# Patient Record
Sex: Male | Born: 1980 | Race: White | Hispanic: No | Marital: Single | Smoking: Current some day smoker
Health system: Southern US, Community
[De-identification: ages and names within clinical notes are randomized; demographics above are authoritative.]

## PROBLEM LIST (undated history)

## (undated) ENCOUNTER — Emergency Department (HOSPITAL_COMMUNITY): Admission: EM | Payer: MEDICAID | Source: Home / Self Care

## (undated) DIAGNOSIS — F909 Attention-deficit hyperactivity disorder, unspecified type: Secondary | ICD-10-CM

## (undated) DIAGNOSIS — F319 Bipolar disorder, unspecified: Secondary | ICD-10-CM

## (undated) DIAGNOSIS — R740 Nonspecific elevation of levels of transaminase and lactic acid dehydrogenase [LDH]: Secondary | ICD-10-CM

## (undated) DIAGNOSIS — K219 Gastro-esophageal reflux disease without esophagitis: Secondary | ICD-10-CM

---
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
## 2007-10-19 ENCOUNTER — Emergency Department (HOSPITAL_COMMUNITY): Admission: EM | Admit: 2007-10-19 | Discharge: 2007-10-19 | Payer: Self-pay | Admitting: Emergency Medicine

## 2008-02-08 ENCOUNTER — Emergency Department (HOSPITAL_COMMUNITY): Admission: EM | Admit: 2008-02-08 | Discharge: 2008-02-08 | Payer: Self-pay | Admitting: Emergency Medicine

## 2008-02-09 ENCOUNTER — Emergency Department (HOSPITAL_COMMUNITY): Admission: EM | Admit: 2008-02-09 | Discharge: 2008-02-09 | Payer: Self-pay | Admitting: Emergency Medicine

## 2008-07-09 ENCOUNTER — Emergency Department (HOSPITAL_COMMUNITY): Admission: EM | Admit: 2008-07-09 | Discharge: 2008-07-09 | Payer: Self-pay | Admitting: Emergency Medicine

## 2008-08-26 ENCOUNTER — Emergency Department (HOSPITAL_COMMUNITY): Admission: EM | Admit: 2008-08-26 | Discharge: 2008-08-26 | Payer: Self-pay | Admitting: Emergency Medicine

## 2009-03-23 ENCOUNTER — Emergency Department (HOSPITAL_COMMUNITY): Admission: EM | Admit: 2009-03-23 | Discharge: 2009-03-23 | Payer: Self-pay | Admitting: Emergency Medicine

## 2009-05-23 ENCOUNTER — Emergency Department (HOSPITAL_COMMUNITY): Admission: EM | Admit: 2009-05-23 | Discharge: 2009-05-23 | Payer: Self-pay | Admitting: Emergency Medicine

## 2009-07-21 ENCOUNTER — Emergency Department (HOSPITAL_COMMUNITY): Admission: EM | Admit: 2009-07-21 | Discharge: 2009-07-21 | Payer: Self-pay | Admitting: Emergency Medicine

## 2009-10-11 ENCOUNTER — Emergency Department (HOSPITAL_COMMUNITY): Admission: EM | Admit: 2009-10-11 | Discharge: 2009-10-11 | Payer: Self-pay | Admitting: Emergency Medicine

## 2010-07-30 ENCOUNTER — Emergency Department (HOSPITAL_COMMUNITY): Payer: Self-pay

## 2010-07-30 ENCOUNTER — Emergency Department (HOSPITAL_COMMUNITY)
Admission: EM | Admit: 2010-07-30 | Discharge: 2010-07-30 | Disposition: A | Discharge status: Home / Self Care | Payer: Self-pay | Attending: Emergency Medicine | Admitting: Emergency Medicine

## 2010-07-30 DIAGNOSIS — J45909 Unspecified asthma, uncomplicated: Secondary | ICD-10-CM | POA: Insufficient documentation

## 2010-07-30 DIAGNOSIS — R0602 Shortness of breath: Secondary | ICD-10-CM | POA: Insufficient documentation

## 2010-07-30 DIAGNOSIS — J3489 Other specified disorders of nose and nasal sinuses: Secondary | ICD-10-CM | POA: Insufficient documentation

## 2010-07-30 DIAGNOSIS — R000 Tachycardia, unspecified: Secondary | ICD-10-CM | POA: Insufficient documentation

## 2010-07-30 DIAGNOSIS — J069 Acute upper respiratory infection, unspecified: Secondary | ICD-10-CM | POA: Insufficient documentation

## 2010-07-31 ENCOUNTER — Emergency Department (HOSPITAL_COMMUNITY)
Admission: EM | Admit: 2010-07-31 | Discharge: 2010-08-01 | Disposition: A | Discharge status: Home / Self Care | Payer: Self-pay | Attending: Emergency Medicine | Admitting: Emergency Medicine

## 2010-07-31 DIAGNOSIS — R059 Cough, unspecified: Secondary | ICD-10-CM | POA: Insufficient documentation

## 2010-07-31 DIAGNOSIS — F988 Other specified behavioral and emotional disorders with onset usually occurring in childhood and adolescence: Secondary | ICD-10-CM | POA: Insufficient documentation

## 2010-07-31 DIAGNOSIS — J45909 Unspecified asthma, uncomplicated: Secondary | ICD-10-CM | POA: Insufficient documentation

## 2010-07-31 DIAGNOSIS — R05 Cough: Secondary | ICD-10-CM | POA: Insufficient documentation

## 2010-07-31 DIAGNOSIS — R6883 Chills (without fever): Secondary | ICD-10-CM | POA: Insufficient documentation

## 2010-11-05 ENCOUNTER — Emergency Department (HOSPITAL_COMMUNITY): Payer: Self-pay

## 2010-11-05 ENCOUNTER — Emergency Department (HOSPITAL_COMMUNITY)
Admission: EM | Admit: 2010-11-05 | Discharge: 2010-11-05 | Disposition: A | Discharge status: Home / Self Care | Payer: Self-pay | Attending: Emergency Medicine | Admitting: Emergency Medicine

## 2010-11-05 DIAGNOSIS — R0602 Shortness of breath: Secondary | ICD-10-CM | POA: Insufficient documentation

## 2010-11-05 DIAGNOSIS — W19XXXA Unspecified fall, initial encounter: Secondary | ICD-10-CM | POA: Insufficient documentation

## 2010-11-05 DIAGNOSIS — M79609 Pain in unspecified limb: Secondary | ICD-10-CM | POA: Insufficient documentation

## 2010-11-05 DIAGNOSIS — J45909 Unspecified asthma, uncomplicated: Secondary | ICD-10-CM | POA: Insufficient documentation

## 2010-11-05 DIAGNOSIS — S93609A Unspecified sprain of unspecified foot, initial encounter: Secondary | ICD-10-CM | POA: Insufficient documentation

## 2011-01-18 ENCOUNTER — Emergency Department (HOSPITAL_COMMUNITY)
Admission: EM | Admit: 2011-01-18 | Discharge: 2011-01-18 | Disposition: A | Discharge status: Home / Self Care | Payer: Self-pay | Attending: Emergency Medicine | Admitting: Emergency Medicine

## 2011-01-18 DIAGNOSIS — R0609 Other forms of dyspnea: Secondary | ICD-10-CM | POA: Insufficient documentation

## 2011-01-18 DIAGNOSIS — F988 Other specified behavioral and emotional disorders with onset usually occurring in childhood and adolescence: Secondary | ICD-10-CM | POA: Insufficient documentation

## 2011-01-18 DIAGNOSIS — R0989 Other specified symptoms and signs involving the circulatory and respiratory systems: Secondary | ICD-10-CM | POA: Insufficient documentation

## 2011-01-18 DIAGNOSIS — J45909 Unspecified asthma, uncomplicated: Secondary | ICD-10-CM | POA: Insufficient documentation

## 2011-01-18 DIAGNOSIS — R0602 Shortness of breath: Secondary | ICD-10-CM | POA: Insufficient documentation

## 2011-01-22 LAB — DIFFERENTIAL
Eosinophils Relative: 0
Lymphocytes Relative: 25
Lymphs Abs: 3.9
Monocytes Absolute: 1.9 — ABNORMAL HIGH
Monocytes Relative: 13 — ABNORMAL HIGH
Neutro Abs: 9.7 — ABNORMAL HIGH

## 2011-01-22 LAB — CBC
HCT: 45.9
Hemoglobin: 15.5
RBC: 5.13
WBC: 15.6 — ABNORMAL HIGH

## 2011-01-22 LAB — RAPID STREP SCREEN (MED CTR MEBANE ONLY): Streptococcus, Group A Screen (Direct): NEGATIVE

## 2011-02-09 ENCOUNTER — Emergency Department (HOSPITAL_COMMUNITY)
Admission: EM | Admit: 2011-02-09 | Discharge: 2011-02-09 | Disposition: A | Discharge status: Home / Self Care | Payer: Medicaid Other | Attending: Emergency Medicine | Admitting: Emergency Medicine

## 2011-02-09 DIAGNOSIS — R0989 Other specified symptoms and signs involving the circulatory and respiratory systems: Secondary | ICD-10-CM | POA: Insufficient documentation

## 2011-02-09 DIAGNOSIS — R0609 Other forms of dyspnea: Secondary | ICD-10-CM | POA: Insufficient documentation

## 2011-02-09 DIAGNOSIS — F988 Other specified behavioral and emotional disorders with onset usually occurring in childhood and adolescence: Secondary | ICD-10-CM | POA: Insufficient documentation

## 2011-02-09 DIAGNOSIS — J45901 Unspecified asthma with (acute) exacerbation: Secondary | ICD-10-CM | POA: Insufficient documentation

## 2011-02-09 DIAGNOSIS — R05 Cough: Secondary | ICD-10-CM | POA: Insufficient documentation

## 2011-02-09 DIAGNOSIS — R059 Cough, unspecified: Secondary | ICD-10-CM | POA: Insufficient documentation

## 2011-04-22 ENCOUNTER — Emergency Department (HOSPITAL_COMMUNITY)
Admission: EM | Admit: 2011-04-22 | Discharge: 2011-04-22 | Disposition: A | Discharge status: Home / Self Care | Payer: Medicaid Other | Attending: Emergency Medicine | Admitting: Emergency Medicine

## 2011-04-22 ENCOUNTER — Encounter: Payer: Self-pay | Admitting: *Deleted

## 2011-04-22 DIAGNOSIS — J45901 Unspecified asthma with (acute) exacerbation: Secondary | ICD-10-CM | POA: Insufficient documentation

## 2011-04-22 DIAGNOSIS — R0789 Other chest pain: Secondary | ICD-10-CM | POA: Insufficient documentation

## 2011-04-22 DIAGNOSIS — F909 Attention-deficit hyperactivity disorder, unspecified type: Secondary | ICD-10-CM | POA: Insufficient documentation

## 2011-04-22 DIAGNOSIS — R0602 Shortness of breath: Secondary | ICD-10-CM | POA: Insufficient documentation

## 2011-04-22 HISTORY — DX: Attention-deficit hyperactivity disorder, unspecified type: F90.9

## 2011-04-22 MED ORDER — ALBUTEROL SULFATE HFA 108 (90 BASE) MCG/ACT IN AERS: 2.0000 | INHALATION_SPRAY | RESPIRATORY_TRACT | Status: DC | PRN

## 2011-04-22 MED ORDER — ALBUTEROL SULFATE HFA 108 (90 BASE) MCG/ACT IN AERS
2.0000 | INHALATION_SPRAY | RESPIRATORY_TRACT | Status: AC
  Administered 2011-04-22: 2 via RESPIRATORY_TRACT
  Filled 2011-04-22: qty 6.7

## 2011-04-22 MED ORDER — IPRATROPIUM BROMIDE 0.02 % IN SOLN: 0.5000 mg | Freq: Once | RESPIRATORY_TRACT | Status: DC

## 2011-04-22 MED ORDER — ALBUTEROL SULFATE (5 MG/ML) 0.5% IN NEBU
INHALATION_SOLUTION | RESPIRATORY_TRACT | Status: AC
  Administered 2011-04-22: 5 mg via RESPIRATORY_TRACT
  Filled 2011-04-22: qty 1

## 2011-04-22 MED ORDER — ALBUTEROL SULFATE (5 MG/ML) 0.5% IN NEBU
5.0000 mg | INHALATION_SOLUTION | RESPIRATORY_TRACT | Status: AC
  Administered 2011-04-22: 5 mg via RESPIRATORY_TRACT

## 2011-04-22 MED ORDER — IPRATROPIUM BROMIDE 0.02 % IN SOLN
RESPIRATORY_TRACT | Status: AC
  Administered 2011-04-22: 0.5 mg
  Filled 2011-04-22: qty 2.5

## 2011-04-22 NOTE — ED Provider Notes (Signed)
History     CSN: [id number]  Arrival date & time 04/22/11  1017   First MD Initiated Contact with Patient 04/22/11 1044      Chief Complaint  Patient presents with  . Shortness of Breath    (Consider location/radiation/quality/duration/timing/severity/associated sxs/prior treatment) HPI Comments: Patient states he has had tightness in his chest and SOB that feels like an asthma attack.  States he ran out of his inhaler last week and cannot get another one filled until Jan 3.  Patient states he is feeling much better following nebulizer treatment.  Denies fevers, CP, cough.  States he would not have come to the ED if his doctor's office was open and could have phoned in a prescription for him.    Patient is a 30 y.o. male presenting with shortness of breath. The history is provided by the patient.  Shortness of Breath  Associated symptoms include shortness of breath.    Past Medical History  Diagnosis Date  . Asthma   . ADHD (attention deficit hyperactivity disorder)     Past Surgical History  Procedure Date  . Tympanoplasty     No family history on file.  History  Substance Use Topics  . Smoking status: Never Smoker   . Smokeless tobacco: Not on file  . Alcohol Use: Yes      Review of Systems  Respiratory: Positive for shortness of breath.   All other systems reviewed and are negative.    Allergies  Augmentin  Home Medications   Current Outpatient Rx  Name Route Sig Dispense Refill  . ACETAMINOPHEN 325 MG PO TABS Oral Take 325 mg by mouth daily as needed. For headache.     . ALBUTEROL SULFATE HFA 108 (90 BASE) MCG/ACT IN AERS Inhalation Inhale 2 puffs into the lungs every 6 (six) hours as needed. For shortness of breath.       BP 115/73  Pulse 63  Temp(Src) 97.7 F (36.5 C) (Oral)  Resp 24  SpO2 98%  Physical Exam  Nursing note and vitals reviewed. Constitutional: He is oriented to person, place, and time. He appears well-developed and  well-nourished.  HENT:  Head: Normocephalic and atraumatic.  Neck: Neck supple.  Cardiovascular: Normal rate and regular rhythm.   Pulmonary/Chest: Effort normal and breath sounds normal. No respiratory distress. He has no wheezes. He has no rales. He exhibits no tenderness.  Neurological: He is alert and oriented to person, place, and time.  Psychiatric: He has a normal mood and affect.    ED Course  Procedures (including critical care time)  Labs Reviewed - No data to display No results found.  11:18 AM Patient feeling much better.  States he just needed a treatment, declines prednisone taper.  Pt previously was on multiple medications for asthma, will discuss with his new PCP further need for asthma maintenance meds.   1. Asthma attack       MDM  Patient with asthma, out of his medications at home, having typical attack.  Feeling much better after one nebulizer treatment.  Lungs CTAB.  Pt declines prednisone prescription.          Rise Patience, [place] 04/22/11 1119

## 2011-04-22 NOTE — ED Notes (Signed)
Patient reports onset of shortness of breath after his shower today.  He states he noted some wheezing on yesterday but today is worse.  Patient states he has hx of asthma but has been out of his inhaler

## 2011-04-24 NOTE — ED Provider Notes (Signed)
Medical screening examination/treatment/procedure(s) were performed by non-physician practitioner and as supervising physician I was immediately available for consultation/collaboration.   Aedan Geimer E Rolla Servidio, MD 04/24/11 0507 

## 2011-05-22 ENCOUNTER — Emergency Department (HOSPITAL_COMMUNITY)
Admission: EM | Admit: 2011-05-22 | Discharge: 2011-05-22 | Disposition: A | Discharge status: Home / Self Care | Payer: Medicaid Other | Attending: Emergency Medicine | Admitting: Emergency Medicine

## 2011-05-22 ENCOUNTER — Encounter (HOSPITAL_COMMUNITY): Payer: Self-pay | Admitting: *Deleted

## 2011-05-22 ENCOUNTER — Emergency Department (HOSPITAL_COMMUNITY): Payer: Medicaid Other

## 2011-05-22 DIAGNOSIS — R059 Cough, unspecified: Secondary | ICD-10-CM | POA: Insufficient documentation

## 2011-05-22 DIAGNOSIS — J40 Bronchitis, not specified as acute or chronic: Secondary | ICD-10-CM | POA: Insufficient documentation

## 2011-05-22 DIAGNOSIS — F909 Attention-deficit hyperactivity disorder, unspecified type: Secondary | ICD-10-CM | POA: Insufficient documentation

## 2011-05-22 DIAGNOSIS — R05 Cough: Secondary | ICD-10-CM | POA: Insufficient documentation

## 2011-05-22 DIAGNOSIS — R6883 Chills (without fever): Secondary | ICD-10-CM | POA: Insufficient documentation

## 2011-05-22 DIAGNOSIS — R0602 Shortness of breath: Secondary | ICD-10-CM | POA: Insufficient documentation

## 2011-05-22 DIAGNOSIS — J45909 Unspecified asthma, uncomplicated: Secondary | ICD-10-CM | POA: Insufficient documentation

## 2011-05-22 DIAGNOSIS — J3489 Other specified disorders of nose and nasal sinuses: Secondary | ICD-10-CM | POA: Insufficient documentation

## 2011-05-22 MED ORDER — IPRATROPIUM BROMIDE 0.02 % IN SOLN
0.5000 mg | Freq: Once | RESPIRATORY_TRACT | Status: AC
  Administered 2011-05-22: 0.5 mg via RESPIRATORY_TRACT
  Filled 2011-05-22: qty 2.5

## 2011-05-22 MED ORDER — PREDNISONE 20 MG PO TABS: 40.0000 mg | ORAL_TABLET | Freq: Every day | ORAL | Status: DC

## 2011-05-22 MED ORDER — AEROCHAMBER PLUS W/MASK MISC
Status: AC
  Filled 2011-05-22: qty 1

## 2011-05-22 MED ORDER — AEROCHAMBER Z-STAT PLUS/MEDIUM MISC
1.0000 | Freq: Once | Status: AC
  Administered 2011-05-22: 1

## 2011-05-22 MED ORDER — DOXYCYCLINE HYCLATE 100 MG PO CAPS: 100.0000 mg | ORAL_CAPSULE | Freq: Two times a day (BID) | ORAL | Status: AC

## 2011-05-22 MED ORDER — ALBUTEROL SULFATE (5 MG/ML) 0.5% IN NEBU
5.0000 mg | INHALATION_SOLUTION | Freq: Once | RESPIRATORY_TRACT | Status: AC
  Administered 2011-05-22: 5 mg via RESPIRATORY_TRACT
  Filled 2011-05-22: qty 1

## 2011-05-22 MED ORDER — CETIRIZINE-PSEUDOEPHEDRINE ER 5-120 MG PO TB12: 1.0000 | ORAL_TABLET | Freq: Every day | ORAL | Status: DC

## 2011-05-22 NOTE — ED Notes (Signed)
Reports having recent sinus infection and now having productive cough with yellow/green sputum. Airway intact.

## 2011-05-22 NOTE — ED Provider Notes (Signed)
History     CSN: [id number]  Arrival date & time 05/22/11  1106   First MD Initiated Contact with Patient 05/22/11 1125      Chief Complaint  Patient presents with  . Cough    (Consider location/radiation/quality/duration/timing/severity/associated sxs/prior treatment) Patient is a 31 y.o. male presenting with cough. The history is provided by the patient.  Cough This is a new problem. Associated symptoms include shortness of breath. Pertinent negatives include no chills and no sore throat.  Pt states he has had a nasal congestion chills, sinus pressure for about a week. States yesterday began having cough, shortness of breath. Hx of asthma. Taking his albuterol with no relief. States cough is productive, yellow sputum, was up all night. Denies know fever. Admits to chills. Denies headache, neck stiffness, nausea, vomiting, abdominal pain, chest pain.   Past Medical History  Diagnosis Date  . Asthma   . ADHD (attention deficit hyperactivity disorder)     Past Surgical History  Procedure Date  . Tympanoplasty     History reviewed. No pertinent family history.  History  Substance Use Topics  . Smoking status: Never Smoker   . Smokeless tobacco: Not on file  . Alcohol Use: Yes      Review of Systems  Constitutional: Negative for fever and chills.  HENT: Positive for congestion. Negative for sore throat.   Eyes: Negative.   Respiratory: Positive for cough and shortness of breath. Negative for chest tightness.   Cardiovascular: Negative.   Gastrointestinal: Negative.   Genitourinary: Negative.   Musculoskeletal: Negative.   Skin: Negative.   Neurological: Negative.   Psychiatric/Behavioral: Negative.     Allergies  Augmentin  Home Medications   Current Outpatient Rx  Name Route Sig Dispense Refill  . ALBUTEROL SULFATE HFA 108 (90 BASE) MCG/ACT IN AERS Inhalation Inhale 2 puffs into the lungs every 6 (six) hours as needed. For shortness of breath.       BP  126/78  Pulse 69  Temp(Src) 98.1 F (36.7 C) (Oral)  Resp 20  SpO2 97%  Physical Exam  Nursing note and vitals reviewed. Constitutional: He is oriented to person, place, and time. He appears well-developed and well-nourished.  HENT:  Head: Normocephalic.  Right Ear: Tympanic membrane, external ear and ear canal normal.  Left Ear: Tympanic membrane, external ear and ear canal normal.  Nose: Rhinorrhea present.  Mouth/Throat: Uvula is midline, oropharynx is clear and moist and mucous membranes are normal.  Eyes: Conjunctivae are normal.  Neck: Neck supple.  Cardiovascular: Normal rate, regular rhythm and normal heart sounds.   Pulmonary/Chest: Effort normal and breath sounds normal. No respiratory distress. He has no wheezes.  Abdominal: Soft. He exhibits no distension.  Musculoskeletal: Normal range of motion. He exhibits no edema.  Neurological: He is alert and oriented to person, place, and time.  Skin: Skin is warm and dry. No erythema.  Psychiatric: He has a normal mood and affect.    ED Course  Procedures (including critical care time)  Pt asked for a neb treatment, one administered.  Dg Chest 2 View  05/22/2011  *RADIOLOGY REPORT*  Clinical Data: Cough and SOB  CHEST - 2 VIEW  Comparison: 08/09/2010  Findings: The heart size and mediastinal contours are within normal limits.  Both lungs are clear.  The visualized skeletal structures are unremarkable.  IMPRESSION: Negative exam.  Original Report Authenticated By: Rosealee Albee, M.D.    CXR negative. Will d/c home with antibiotic given hx of asthma,  prednisone, decongestants. Pt in NAD. VS all within normal.   No diagnosis found.    MDM          Lottie Mussel, PA 05/22/11 1557

## 2011-05-22 NOTE — ED Provider Notes (Signed)
Medical screening examination/treatment/procedure(s) were performed by non-physician practitioner and as supervising physician I was immediately available for consultation/collaboration.   Hershal Eriksson, MD 05/22/11 2141 

## 2011-07-31 ENCOUNTER — Emergency Department (HOSPITAL_COMMUNITY)
Admission: EM | Admit: 2011-07-31 | Discharge: 2011-07-31 | Disposition: A | Discharge status: Home / Self Care | Payer: Self-pay | Attending: Emergency Medicine | Admitting: Emergency Medicine

## 2011-07-31 DIAGNOSIS — F909 Attention-deficit hyperactivity disorder, unspecified type: Secondary | ICD-10-CM | POA: Insufficient documentation

## 2011-07-31 DIAGNOSIS — R05 Cough: Secondary | ICD-10-CM | POA: Insufficient documentation

## 2011-07-31 DIAGNOSIS — R0602 Shortness of breath: Secondary | ICD-10-CM | POA: Insufficient documentation

## 2011-07-31 DIAGNOSIS — J45909 Unspecified asthma, uncomplicated: Secondary | ICD-10-CM | POA: Insufficient documentation

## 2011-07-31 DIAGNOSIS — R059 Cough, unspecified: Secondary | ICD-10-CM | POA: Insufficient documentation

## 2011-07-31 MED ORDER — ALBUTEROL SULFATE (5 MG/ML) 0.5% IN NEBU
5.0000 mg | INHALATION_SOLUTION | Freq: Once | RESPIRATORY_TRACT | Status: AC
  Administered 2011-07-31: 5 mg via RESPIRATORY_TRACT
  Filled 2011-07-31: qty 1

## 2011-07-31 MED ORDER — ALBUTEROL SULFATE HFA 108 (90 BASE) MCG/ACT IN AERS
2.0000 | INHALATION_SPRAY | Freq: Once | RESPIRATORY_TRACT | Status: AC
  Administered 2011-07-31: 2 via RESPIRATORY_TRACT

## 2011-07-31 MED ORDER — PREDNISONE 10 MG PO TABS: 20.0000 mg | ORAL_TABLET | Freq: Every day | ORAL | Status: DC

## 2011-07-31 MED ORDER — ALBUTEROL SULFATE HFA 108 (90 BASE) MCG/ACT IN AERS
INHALATION_SPRAY | RESPIRATORY_TRACT | Status: AC
  Filled 2011-07-31: qty 6.7

## 2011-07-31 MED ORDER — IPRATROPIUM BROMIDE 0.02 % IN SOLN
0.5000 mg | Freq: Once | RESPIRATORY_TRACT | Status: AC
  Administered 2011-07-31: 0.5 mg via RESPIRATORY_TRACT
  Filled 2011-07-31: qty 2.5

## 2011-07-31 MED ORDER — PREDNISONE 20 MG PO TABS
60.0000 mg | ORAL_TABLET | Freq: Once | ORAL | Status: AC
  Administered 2011-07-31: 60 mg via ORAL
  Filled 2011-07-31: qty 3

## 2011-07-31 NOTE — ED Notes (Signed)
Pt reports congestion x 2 days. Cough, sore throat, denies fever. Taken tylenol without relief.

## 2011-07-31 NOTE — Discharge Instructions (Signed)
Asthma, Adult Asthma is caused by narrowing of the air passages in the lungs. It may be triggered by pollen, dust, animal dander, molds, some foods, respiratory infections, exposure to smoke, exercise, emotional stress or other allergens (things that cause allergic reactions or allergies). Repeat attacks are common. HOME CARE INSTRUCTIONS   Use prescription medications as ordered by your caregiver.   Avoid pollen, dust, animal dander, molds, smoke and other things that cause attacks at home and at work.   You may have fewer attacks if you decrease dust in your home. Electrostatic air cleaners may help.   It may help to replace your pillows or mattress with materials less likely to cause allergies.   Talk to your caregiver about an action plan for managing asthma attacks at home, including, the use of a peak flow meter which measures the severity of your asthma attack. An action plan can help minimize or stop the attack without having to seek medical care.   If you are not on a fluid restriction, drink 8 to 10 glasses of water each day.   Always have a plan prepared for seeking medical attention, including, calling your physician, accessing local emergency care, and calling 911 (in the [place] for a severe attack.   Discuss possible exercise routines with your caregiver.   If animal dander is the cause of asthma, you may need to get rid of pets.  SEEK MEDICAL CARE IF:   You have wheezing and shortness of breath even if taking medicine to prevent attacks.   You have muscle aches, chest pain or thickening of sputum.   Your sputum changes from clear or white to yellow, green, gray, or bloody.   You have any problems that may be related to the medicine you are taking (such as a rash, itching, swelling or trouble breathing).  SEEK IMMEDIATE MEDICAL CARE IF:   Your usual medicines do not stop your wheezing or there is increased coughing and/or shortness of breath.   You have increased  difficulty breathing.   You have a fever.  MAKE SURE YOU:   Understand these instructions.   Will watch your condition.   Will get help right away if you are not doing well or get worse.  Document Released: 04/08/2005 Document Revised: 03/28/2011 Document Reviewed: 11/25/2007 [facility] Patient Information 2012 [place], [place].

## 2011-07-31 NOTE — ED Provider Notes (Signed)
History     CSN: [id number]  Arrival date & time 07/31/11  1616   First MD Initiated Contact with Patient 07/31/11 1739      No chief complaint on file.   (Consider location/radiation/quality/duration/timing/severity/associated sxs/prior treatment) The history is provided by the patient.   patient here with cough and shortness of breath for the past 2 days. Patient does have a history of asthma and is out of his inhaler. No fever, vomiting, diarrhea. The medications used prior to arrival. No sore throat ear pain. Nothing makes the symptoms better or worse.  Past Medical History  Diagnosis Date  . Asthma   . ADHD (attention deficit hyperactivity disorder)     Past Surgical History  Procedure Date  . Tympanoplasty     No family history on file.  History  Substance Use Topics  . Smoking status: Never Smoker   . Smokeless tobacco: Not on file  . Alcohol Use: Yes      Review of Systems  All other systems reviewed and are negative.    Allergies  Augmentin  Home Medications   Current Outpatient Rx  Name Route Sig Dispense Refill  . ACETAMINOPHEN 500 MG PO TABS Oral Take 1,000 mg by mouth every 6 (six) hours as needed. For pain    . ALBUTEROL SULFATE HFA 108 (90 BASE) MCG/ACT IN AERS Inhalation Inhale 2 puffs into the lungs every 6 (six) hours as needed. For shortness of breath.     . BUDESONIDE-FORMOTEROL FUMARATE 160-4.5 MCG/ACT IN AERO Inhalation Inhale 2 puffs into the lungs 2 (two) times daily.      BP 115/83  Pulse 90  Temp 98.5 F (36.9 C)  Resp 20  SpO2 98%  Physical Exam  Nursing note and vitals reviewed. Constitutional: He is oriented to person, place, and time. Vital signs are normal. He appears well-developed and well-nourished.  Non-toxic appearance. No distress.  HENT:  Head: Normocephalic and atraumatic.  Eyes: Conjunctivae, EOM and lids are normal. Pupils are equal, round, and reactive to light.  Neck: Normal range of motion. Neck supple. No  tracheal deviation present. No mass present.  Cardiovascular: Normal rate, regular rhythm and normal heart sounds.  Exam reveals no gallop.   No murmur heard. Pulmonary/Chest: Effort normal. No stridor. No respiratory distress. He has decreased breath sounds. He has no wheezes. He has no rhonchi. He has no rales.  Abdominal: Soft. Normal appearance and bowel sounds are normal. He exhibits no distension. There is no tenderness. There is no rebound and no CVA tenderness.  Musculoskeletal: Normal range of motion. He exhibits no edema and no tenderness.  Neurological: He is alert and oriented to person, place, and time. He has normal strength. No cranial nerve deficit or sensory deficit. GCS eye subscore is 4. GCS verbal subscore is 5. GCS motor subscore is 6.  Skin: Skin is warm and dry. No abrasion and no rash noted.  Psychiatric: He has a normal mood and affect. His speech is normal and behavior is normal.    ED Course  Procedures (including critical care time)  Labs Reviewed - No data to display No results found.   No diagnosis found.    MDM  Patient given albuterol treatment here feels better. He'll be discharged home        Toy Baker, MD 07/31/11 [phone]

## 2011-07-31 NOTE — ED Notes (Signed)
RT at bedside  Breathing tx. given

## 2011-07-31 NOTE — ED Notes (Signed)
Pt feels better after breathing treatment

## 2011-09-05 ENCOUNTER — Encounter (HOSPITAL_COMMUNITY): Payer: Self-pay | Admitting: Physical Medicine and Rehabilitation

## 2011-09-05 ENCOUNTER — Emergency Department (HOSPITAL_COMMUNITY)
Admission: EM | Admit: 2011-09-05 | Discharge: 2011-09-05 | Disposition: A | Discharge status: Home / Self Care | Payer: Self-pay | Attending: Emergency Medicine | Admitting: Emergency Medicine

## 2011-09-05 DIAGNOSIS — R0609 Other forms of dyspnea: Secondary | ICD-10-CM | POA: Insufficient documentation

## 2011-09-05 DIAGNOSIS — F909 Attention-deficit hyperactivity disorder, unspecified type: Secondary | ICD-10-CM | POA: Insufficient documentation

## 2011-09-05 DIAGNOSIS — R0602 Shortness of breath: Secondary | ICD-10-CM | POA: Insufficient documentation

## 2011-09-05 DIAGNOSIS — R0989 Other specified symptoms and signs involving the circulatory and respiratory systems: Secondary | ICD-10-CM | POA: Insufficient documentation

## 2011-09-05 DIAGNOSIS — J45901 Unspecified asthma with (acute) exacerbation: Secondary | ICD-10-CM | POA: Insufficient documentation

## 2011-09-05 MED ORDER — ALBUTEROL SULFATE (5 MG/ML) 0.5% IN NEBU
5.0000 mg | INHALATION_SOLUTION | Freq: Once | RESPIRATORY_TRACT | Status: AC
Start: 2011-09-05 — End: 2011-09-05
  Administered 2011-09-05: 5 mg via RESPIRATORY_TRACT

## 2011-09-05 MED ORDER — ALBUTEROL SULFATE (5 MG/ML) 0.5% IN NEBU: 2.5000 mg | INHALATION_SOLUTION | Freq: Four times a day (QID) | RESPIRATORY_TRACT | Status: DC | PRN

## 2011-09-05 MED ORDER — IPRATROPIUM BROMIDE 0.02 % IN SOLN
RESPIRATORY_TRACT | Status: AC
  Filled 2011-09-05: qty 2.5

## 2011-09-05 MED ORDER — ALBUTEROL SULFATE (5 MG/ML) 0.5% IN NEBU
2.5000 mg | INHALATION_SOLUTION | Freq: Once | RESPIRATORY_TRACT | Status: AC
  Administered 2011-09-05: 2.5 mg via RESPIRATORY_TRACT
  Filled 2011-09-05: qty 0.5

## 2011-09-05 MED ORDER — ALBUTEROL SULFATE (5 MG/ML) 0.5% IN NEBU
INHALATION_SOLUTION | RESPIRATORY_TRACT | Status: AC
  Filled 2011-09-05: qty 1

## 2011-09-05 MED ORDER — PREDNISONE 20 MG PO TABS
60.0000 mg | ORAL_TABLET | Freq: Once | ORAL | Status: AC
Start: 2011-09-05 — End: 2011-09-05
  Administered 2011-09-05: 60 mg via ORAL
  Filled 2011-09-05: qty 3

## 2011-09-05 MED ORDER — IPRATROPIUM BROMIDE 0.02 % IN SOLN
0.5000 mg | Freq: Once | RESPIRATORY_TRACT | Status: AC
  Administered 2011-09-05: 0.5 mg via RESPIRATORY_TRACT
  Filled 2011-09-05: qty 2.5

## 2011-09-05 MED ORDER — ALBUTEROL SULFATE HFA 108 (90 BASE) MCG/ACT IN AERS
1.0000 | INHALATION_SPRAY | Freq: Once | RESPIRATORY_TRACT | Status: AC
  Administered 2011-09-05: 1 via RESPIRATORY_TRACT
  Filled 2011-09-05: qty 6.7

## 2011-09-05 MED ORDER — PREDNISONE 50 MG PO TABS: ORAL_TABLET | ORAL | Status: DC

## 2011-09-05 MED ORDER — IPRATROPIUM BROMIDE 0.02 % IN SOLN
0.5000 mg | Freq: Once | RESPIRATORY_TRACT | Status: AC
  Administered 2011-09-05: 0.5 mg via RESPIRATORY_TRACT

## 2011-09-05 NOTE — ED Notes (Signed)
Pt ambulated around cdu and thru triage. Pulse ox sats dropped to 91% but quick recovery to 97% Pt. Did not exhibit any signs of sob or dizziness. Was able to speak in complete sentences w/o any problem.

## 2011-09-05 NOTE — ED Notes (Signed)
Pt presents to department for evaluation of asthma exacerbation. States he called EMS earlier today, received breathing treatment with some relief. States wheezing and difficulty breathing continued. Labored respirations, audible wheezing noted upon arrival. States he ran out of nebulizer treatments at home. Denies pain. He is conscious alert and oriented x4.

## 2011-09-05 NOTE — Discharge Instructions (Signed)

## 2011-09-05 NOTE — ED Provider Notes (Signed)
History   This chart was scribed for Devin Octave, MD by Charolett Bumpers . The patient was seen in room STRE2/STRE2.    CSN: [id number]  Arrival date & time 09/05/11  1322   First MD Initiated Contact with Patient 09/05/11 1413      Chief Complaint  Patient presents with  . Asthma    (Consider location/radiation/quality/duration/timing/severity/associated sxs/prior treatment) HPI Devin [name] is a 31 y.o. male who presents to the Emergency Department complaining of intermittent, moderate SOB with associated wheezing and difficulty breathing. Patient denies any fevers, chest pain or cough. Patient states that he has received 3 breathing treatments in the past 24 hours, one of which was in triage today prior to examination. Patient reports a h/o asthma. Patient states that he normally uses an Albuterol nebulizer at home, but recently ran out. Patient reports that he has been seen several times for asthma related symptoms in the ED but denies any admissions. Patient states that he believes his allergies triggered his asthma symptoms today. Patient denies any other symptoms.   Past Medical History  Diagnosis Date  . Asthma   . ADHD (attention deficit hyperactivity disorder)     Past Surgical History  Procedure Date  . Tympanoplasty     History reviewed. No pertinent family history.  History  Substance Use Topics  . Smoking status: Never Smoker   . Smokeless tobacco: Not on file  . Alcohol Use: Yes     occasionally      Review of Systems  Constitutional: Negative for fever.  Respiratory: Positive for shortness of breath and wheezing. Negative for cough.   Cardiovascular: Negative for chest pain.  All other systems reviewed and are negative.    Allergies  Amoxicillin-pot clavulanate  Home Medications   Current Outpatient Rx  Name Route Sig Dispense Refill  . ALBUTEROL SULFATE HFA 108 (90 BASE) MCG/ACT IN AERS Inhalation Inhale 2 puffs into the lungs every  6 (six) hours as needed. For shortness of breath.     . BUDESONIDE-FORMOTEROL FUMARATE 160-4.5 MCG/ACT IN AERO Inhalation Inhale 2 puffs into the lungs 2 (two) times daily.      BP 119/80  Pulse 88  Temp(Src) 98.2 F (36.8 C) (Oral)  Resp 24  SpO2 99%  Physical Exam  Nursing note and vitals reviewed. Constitutional: He is oriented to person, place, and time. He appears well-developed and well-nourished. No distress.  HENT:  Head: Normocephalic and atraumatic.  Eyes: EOM are normal.  Neck: Neck supple. No tracheal deviation present.  Cardiovascular: Normal rate, regular rhythm and normal heart sounds.   Pulmonary/Chest: Effort normal. No respiratory distress. He has wheezes.       Diffuse wheezing with good air exchange.   Musculoskeletal: Normal range of motion.  Neurological: He is alert and oriented to person, place, and time.  Skin: Skin is warm and dry.  Psychiatric: He has a normal mood and affect. His behavior is normal.    ED Course  Procedures (including critical care time)  DIAGNOSTIC STUDIES: Oxygen Saturation is 97% on room air, normal by my interpretation.    COORDINATION OF CARE:  1422: Discussed planned course of treatment with the patient, who is agreeable at this time.  1534: Recheck: Reevaluation of patient, patient states he is feeling improved. Will observe the patient after walking.    Labs Reviewed - No data to display No results found.   No diagnosis found.    MDM  Asthma exacerbation. Home medicines. Received albuterol via  EMS. No distress, speaking in full sentences.  Lungs with scattered wheezes and good air exchange after one breathing treatment prior to my evaluation.  No wheezing on recheck. Lungs clear. Ambulatory without desaturation. Will refill home nebulizer, and provide inhaler.  I personally performed the services described in this documentation, which was scribed in my presence.  The recorded information has been reviewed and  considered.       Devin Octave, MD 09/05/11 1535

## 2011-09-08 ENCOUNTER — Encounter (HOSPITAL_COMMUNITY): Payer: Self-pay | Admitting: *Deleted

## 2011-09-08 ENCOUNTER — Emergency Department (HOSPITAL_COMMUNITY)
Admission: EM | Admit: 2011-09-08 | Discharge: 2011-09-08 | Disposition: A | Discharge status: Home / Self Care | Payer: Self-pay | Attending: Emergency Medicine | Admitting: Emergency Medicine

## 2011-09-08 DIAGNOSIS — J45909 Unspecified asthma, uncomplicated: Secondary | ICD-10-CM | POA: Insufficient documentation

## 2011-09-08 MED ORDER — PREDNISONE 20 MG PO TABS
60.0000 mg | ORAL_TABLET | Freq: Once | ORAL | Status: AC
  Administered 2011-09-08: 60 mg via ORAL
  Filled 2011-09-08: qty 3

## 2011-09-08 MED ORDER — ALBUTEROL SULFATE (5 MG/ML) 0.5% IN NEBU
2.5000 mg | INHALATION_SOLUTION | Freq: Once | RESPIRATORY_TRACT | Status: AC
  Administered 2011-09-08: 2.5 mg via RESPIRATORY_TRACT
  Filled 2011-09-08: qty 1

## 2011-09-08 MED ORDER — IPRATROPIUM BROMIDE 0.02 % IN SOLN
RESPIRATORY_TRACT | Status: AC
  Filled 2011-09-08: qty 2.5

## 2011-09-08 MED ORDER — ALBUTEROL SULFATE HFA 108 (90 BASE) MCG/ACT IN AERS
2.0000 | INHALATION_SPRAY | RESPIRATORY_TRACT | Status: DC | PRN
  Administered 2011-09-08: 2 via RESPIRATORY_TRACT
  Filled 2011-09-08: qty 6.7

## 2011-09-08 MED ORDER — ALBUTEROL SULFATE (5 MG/ML) 0.5% IN NEBU: 2.5000 mg | INHALATION_SOLUTION | Freq: Once | RESPIRATORY_TRACT | Status: DC

## 2011-09-08 MED ORDER — ALBUTEROL SULFATE (5 MG/ML) 0.5% IN NEBU
INHALATION_SOLUTION | RESPIRATORY_TRACT | Status: AC
  Filled 2011-09-08: qty 2

## 2011-09-08 MED ORDER — IPRATROPIUM BROMIDE 0.02 % IN SOLN
0.5000 mg | Freq: Once | RESPIRATORY_TRACT | Status: AC
  Administered 2011-09-08: 0.5 mg via RESPIRATORY_TRACT
  Filled 2011-09-08: qty 2.5

## 2011-09-08 MED ORDER — PREDNISONE 20 MG PO TABS: 60.0000 mg | ORAL_TABLET | Freq: Every day | ORAL | Status: DC

## 2011-09-08 NOTE — Discharge Instructions (Signed)
You should inhaler as needed. Prednisone for one week. Try to get a primary care

## 2011-09-08 NOTE — ED Notes (Signed)
EMs called home.  Found patient sitting on porch.  He was complaining of shortness of breath. He states that he had ran out of his medications over a month ago.   He is not complaining of pain or nausea at this time.

## 2011-09-08 NOTE — ED Provider Notes (Signed)
History     CSN: [id number]  Arrival date & time 09/08/11  1023   None     Chief Complaint  Patient presents with  . Shortness of Breath    (Consider location/radiation/quality/duration/timing/severity/associated sxs/prior treatment) HPI... known asthmatic complains of shortness of breath and wheezing for 24 hours. Worse today.  Ran out of his medication approximately one month ago. Exertion make symptoms worse. No fever, sweats, chills, rusty sputum. Severity is mild.  Past Medical History  Diagnosis Date  . Asthma   . ADHD (attention deficit hyperactivity disorder)     Past Surgical History  Procedure Date  . Tympanoplasty     History reviewed. No pertinent family history.  History  Substance Use Topics  . Smoking status: Never Smoker   . Smokeless tobacco: Not on file  . Alcohol Use: Yes     occasionally      Review of Systems  All other systems reviewed and are negative.    Allergies  Amoxicillin-pot clavulanate  Home Medications   Current Outpatient Rx  Name Route Sig Dispense Refill  . ALBUTEROL SULFATE HFA 108 (90 BASE) MCG/ACT IN AERS Inhalation Inhale 2 puffs into the lungs every 6 (six) hours as needed. For shortness of breath.     . BUDESONIDE-FORMOTEROL FUMARATE 160-4.5 MCG/ACT IN AERO Inhalation Inhale 2 puffs into the lungs 2 (two) times daily.    Marland Kitchen PREDNISONE 50 MG PO TABS  1 tablet PO daily 5 tablet 0    There were no vitals taken for this visit.  Physical Exam  Nursing note and vitals reviewed. Constitutional: He is oriented to person, place, and time. He appears well-developed and well-nourished.  HENT:  Head: Normocephalic and atraumatic.  Eyes: Conjunctivae and EOM are normal. Pupils are equal, round, and reactive to light.  Neck: Normal range of motion. Neck supple.  Cardiovascular: Normal rate and regular rhythm.   Pulmonary/Chest: Effort normal.       Bilateral expiratory wheeze  Abdominal: Soft. Bowel sounds are normal.    Musculoskeletal: Normal range of motion.  Neurological: He is alert and oriented to person, place, and time.  Skin: Skin is warm and dry.  Psychiatric: He has a normal mood and affect.    ED Course  Procedures (including critical care time)  Labs Reviewed - No data to display No results found.   No diagnosis found.    MDM  Feeling much better after 2 breathing treatments.  Discharge home on albuterol MDI and prednisone.        Donnetta Hutching, MD 09/08/11 1348

## 2011-09-08 NOTE — ED Notes (Signed)
Patient is alert and oriented x3.  He was given DC instructions and follow up visit instructions.  Patient gave verbal understanding.  He was DC ambulatory under his own power to home.  V/S stable.  He was not showing any signs of distress on DC 

## 2011-09-08 NOTE — ED Notes (Signed)
Patient is alert and oriented x3.  He is complaining of shortness of breath related to his Diagnosis of Asthma that started this morning.  He denies any pain.

## 2011-09-09 ENCOUNTER — Encounter (HOSPITAL_COMMUNITY): Payer: Self-pay | Admitting: Emergency Medicine

## 2011-09-09 ENCOUNTER — Emergency Department (HOSPITAL_COMMUNITY)
Admission: EM | Admit: 2011-09-09 | Discharge: 2011-09-09 | Disposition: A | Discharge status: Home / Self Care | Payer: Self-pay | Attending: Emergency Medicine | Admitting: Emergency Medicine

## 2011-09-09 ENCOUNTER — Emergency Department (HOSPITAL_COMMUNITY): Payer: Self-pay

## 2011-09-09 DIAGNOSIS — R0982 Postnasal drip: Secondary | ICD-10-CM | POA: Insufficient documentation

## 2011-09-09 DIAGNOSIS — J45901 Unspecified asthma with (acute) exacerbation: Secondary | ICD-10-CM | POA: Insufficient documentation

## 2011-09-09 DIAGNOSIS — R6889 Other general symptoms and signs: Secondary | ICD-10-CM | POA: Insufficient documentation

## 2011-09-09 DIAGNOSIS — F909 Attention-deficit hyperactivity disorder, unspecified type: Secondary | ICD-10-CM | POA: Insufficient documentation

## 2011-09-09 DIAGNOSIS — R05 Cough: Secondary | ICD-10-CM | POA: Insufficient documentation

## 2011-09-09 DIAGNOSIS — R059 Cough, unspecified: Secondary | ICD-10-CM | POA: Insufficient documentation

## 2011-09-09 MED ORDER — IPRATROPIUM BROMIDE 0.02 % IN SOLN
RESPIRATORY_TRACT | Status: AC
  Filled 2011-09-09: qty 2.5

## 2011-09-09 MED ORDER — ALBUTEROL (5 MG/ML) CONTINUOUS INHALATION SOLN
10.0000 mg/h | INHALATION_SOLUTION | RESPIRATORY_TRACT | Status: DC
  Administered 2011-09-09 (×3): 10 mg/h via RESPIRATORY_TRACT

## 2011-09-09 MED ORDER — ALBUTEROL SULFATE (5 MG/ML) 0.5% IN NEBU
5.0000 mg | INHALATION_SOLUTION | Freq: Once | RESPIRATORY_TRACT | Status: AC
  Administered 2011-09-09: 5 mg via RESPIRATORY_TRACT
  Filled 2011-09-09: qty 1

## 2011-09-09 MED ORDER — ALBUTEROL SULFATE HFA 108 (90 BASE) MCG/ACT IN AERS
2.0000 | INHALATION_SPRAY | Freq: Once | RESPIRATORY_TRACT | Status: AC
  Administered 2011-09-09: 2 via RESPIRATORY_TRACT
  Filled 2011-09-09: qty 6.7

## 2011-09-09 MED ORDER — AEROCHAMBER Z-STAT PLUS/MEDIUM MISC: 1.0000 | Freq: Once | Status: DC

## 2011-09-09 MED ORDER — ALBUTEROL SULFATE (5 MG/ML) 0.5% IN NEBU
INHALATION_SOLUTION | RESPIRATORY_TRACT | Status: AC
  Administered 2011-09-09: 03:00:00
  Filled 2011-09-09: qty 2

## 2011-09-09 MED ORDER — IPRATROPIUM BROMIDE 0.02 % IN SOLN
RESPIRATORY_TRACT | Status: AC
  Administered 2011-09-09: 03:00:00
  Filled 2011-09-09: qty 2.5

## 2011-09-09 MED ORDER — ALBUTEROL SULFATE HFA 108 (90 BASE) MCG/ACT IN AERS
2.0000 | INHALATION_SPRAY | RESPIRATORY_TRACT | Status: DC
  Filled 2011-09-09: qty 6.7

## 2011-09-09 MED ORDER — ALBUTEROL (5 MG/ML) CONTINUOUS INHALATION SOLN
10.0000 mg/h | INHALATION_SOLUTION | Freq: Once | RESPIRATORY_TRACT | Status: AC
  Administered 2011-09-09: 10 mg/h via RESPIRATORY_TRACT

## 2011-09-09 MED ORDER — IPRATROPIUM BROMIDE 0.02 % IN SOLN
0.5000 mg | Freq: Once | RESPIRATORY_TRACT | Status: AC
  Administered 2011-09-09: 0.5 mg via RESPIRATORY_TRACT
  Filled 2011-09-09: qty 2.5

## 2011-09-09 MED ORDER — PREDNISONE 20 MG PO TABS
60.0000 mg | ORAL_TABLET | Freq: Once | ORAL | Status: AC
  Administered 2011-09-09: 60 mg via ORAL
  Filled 2011-09-09: qty 3

## 2011-09-09 MED ORDER — METHYLPREDNISOLONE SODIUM SUCC 125 MG IJ SOLR
INTRAMUSCULAR | Status: AC
  Administered 2011-09-09: 03:00:00
  Filled 2011-09-09: qty 2

## 2011-09-09 MED ORDER — ALBUTEROL SULFATE (5 MG/ML) 0.5% IN NEBU
INHALATION_SOLUTION | RESPIRATORY_TRACT | Status: AC
  Filled 2011-09-09: qty 2.5

## 2011-09-09 MED ORDER — ALBUTEROL SULFATE (5 MG/ML) 0.5% IN NEBU
INHALATION_SOLUTION | RESPIRATORY_TRACT | Status: AC
  Filled 2011-09-09: qty 1

## 2011-09-09 MED ORDER — IPRATROPIUM BROMIDE 0.02 % IN SOLN
0.5000 mg | Freq: Once | RESPIRATORY_TRACT | Status: AC
  Administered 2011-09-09: 0.5 mg via RESPIRATORY_TRACT

## 2011-09-09 MED ORDER — ALBUTEROL SULFATE (5 MG/ML) 0.5% IN NEBU
5.0000 mg | INHALATION_SOLUTION | Freq: Once | RESPIRATORY_TRACT | Status: AC
  Administered 2011-09-09: 5 mg via RESPIRATORY_TRACT

## 2011-09-09 NOTE — Progress Notes (Signed)
Confirms pt still sees Dr Quintella Reichert and "Randa Evens" Seen in last 3 months

## 2011-09-09 NOTE — ED Notes (Signed)
Third time here in 24 hours, with asthma attack, has been using neb and rescue inhaler, has not started prednisone yet.

## 2011-09-09 NOTE — ED Provider Notes (Signed)
History     CSN: [id number]  Arrival date & time 09/09/11  1412   First MD Initiated Contact with Patient 09/09/11 1729      Chief Complaint  Patient presents with  . Asthma    (Consider location/radiation/quality/duration/timing/severity/associated sxs/prior treatment) HPI Comments: Patient presents here today with an asthma exacerbation. This is his fourth ED visit in the last several days. He says that otherwise it feels like a normal asthma exacerbation. He was feeling much better. This morning when he left the ED went to sleep and in the woke up he started feeling wheezy and short of breath. Again. Denies any chest pain. He does have cough with some productive sputum. He's had a little bit of runny nose, and postnasal drip. Denies any fevers. Denies any leg pain or swelling. He has been using an albuterol MDI, which she was given earlier today, but has not yet started his prednisone.  Patient is a 31 y.o. male presenting with asthma. The history is provided by the patient.  Asthma Associated symptoms include shortness of breath. Pertinent negatives include no chest pain, no abdominal pain and no headaches.    Past Medical History  Diagnosis Date  . Asthma   . ADHD (attention deficit hyperactivity disorder)     Past Surgical History  Procedure Date  . Tympanoplasty     History reviewed. No pertinent family history.  History  Substance Use Topics  . Smoking status: Never Smoker   . Smokeless tobacco: Not on file  . Alcohol Use: Yes     occasionally      Review of Systems  Constitutional: Negative for fever, chills, diaphoresis and fatigue.  HENT: Negative for congestion, rhinorrhea and sneezing.   Eyes: Negative.   Respiratory: Positive for cough, shortness of breath and wheezing. Negative for chest tightness.   Cardiovascular: Negative for chest pain and leg swelling.  Gastrointestinal: Negative for nausea, vomiting, abdominal pain, diarrhea and blood in stool.    Genitourinary: Negative for frequency, hematuria, flank pain and difficulty urinating.  Musculoskeletal: Negative for back pain and arthralgias.  Skin: Negative for rash.  Neurological: Negative for dizziness, speech difficulty, weakness, numbness and headaches.    Allergies  Amoxicillin-pot clavulanate and Other  Home Medications   Current Outpatient Rx  Name Route Sig Dispense Refill  . ALBUTEROL SULFATE HFA 108 (90 BASE) MCG/ACT IN AERS Inhalation Inhale 2 puffs into the lungs every 4 (four) hours as needed. Wheezing/shortness of breath.    . ALBUTEROL SULFATE (2.5 MG/3ML) 0.083% IN NEBU Nebulization Take 2.5 mg by nebulization every 6 (six) hours as needed. Wheezing/shortness of breath.      BP 137/75  Pulse 88  Temp(Src) 98.4 F (36.9 C) (Oral)  Resp 23  SpO2 100%  Physical Exam  Constitutional: He is oriented to person, place, and time. He appears well-developed and well-nourished.  HENT:  Head: Normocephalic and atraumatic.  Eyes: Pupils are equal, round, and reactive to light.  Neck: Normal range of motion. Neck supple.  Cardiovascular: Normal rate, regular rhythm and normal heart sounds.   Pulmonary/Chest: Effort normal. No respiratory distress. He has wheezes. He has no rales. He exhibits no tenderness.       Decreased breath sounds bilaterally  Abdominal: Soft. Bowel sounds are normal. There is no tenderness. There is no rebound and no guarding.  Musculoskeletal: Normal range of motion. He exhibits no edema and no tenderness.  Lymphadenopathy:    He has no cervical adenopathy.  Neurological: He is alert  and oriented to person, place, and time.  Skin: Skin is warm and dry. No rash noted.  Psychiatric: He has a normal mood and affect.    ED Course  Procedures (including critical care time)   Dg Chest 2 View  09/09/2011  *RADIOLOGY REPORT*  Clinical Data: Shortness of breath.  Mid chest pain.  History of asthma.  CHEST - 2 VIEW  Comparison: Two-view chest  x-ray 05/22/2011, 07/30/2010, 07/09/2008.  Findings: Cardiomediastinal silhouette unremarkable, unchanged. Mild central peribronchial thickening, unchanged from prior examinations.  Lungs otherwise clear.  No pleural effusions. Visualized bony thorax intact.  IMPRESSION: Stable mild changes of chronic bronchitis and/or asthma.  No acute cardiopulmonary disease.  Original Report Authenticated By: Arnell Sieving, M.D.     1. Asthma       MDM  Pt feeling much better.  Lungs improved, sats 98% on RA.  No evidence of pneumonia.  Nothing else to suggest PE.  Stressed importance to pt of starting steroids.  Has neb machine at home to use        Rolan Bucco, MD 09/09/11 2120

## 2011-09-09 NOTE — ED Notes (Signed)
Pt states that he has been coughing up mucus for 2 days.  Pt states that the tops of his lungs feel tight, while normally the sides feel tight when he has an asthma attack.  Pt states he has had a couple of nebs today and had IV solu-medrol by ems.  Pt states he started having difficulty breathing 2 hours after getting home.  Pt has dyspnea on exertion.  Pt is asking that he have an x ray.  Pt states he wonders if he has a lung infection.

## 2011-09-09 NOTE — ED Provider Notes (Signed)
History     CSN: [id number]  Arrival date & time 09/09/11  0244   First MD Initiated Contact with Patient 09/09/11 0355      Chief Complaint  Patient presents with  . Shortness of Breath    (Consider location/radiation/quality/duration/timing/severity/associated sxs/prior treatment) HPI Comments: Patient with h/o asthma, no admissions -- 3 visits to ED in 4 days for wheezing and SOB. Treated with albuterol with improvement. Patient states that he has 'been tight' all day. Was given prednisone yesterday but does have the money to fill it. States he does not have an albuterol inhaler at the current time. No fever, N/V/D, chest pain, palpitations. Nothing makes symptoms worse. EMS gave albuterol and solumedrol en route.   Patient is a 31 y.o. male presenting with shortness of breath. The history is provided by the patient.  Shortness of Breath  The current episode started 3 to 5 days ago. The onset was gradual. The problem has been unchanged. The problem is moderate. The symptoms are relieved by beta-agonist inhalers. The symptoms are aggravated by nothing. Associated symptoms include shortness of breath and wheezing. Pertinent negatives include no chest pain, no fever, no rhinorrhea, no sore throat and no cough. He has had intermittent steroid use. He has had no prior hospitalizations. He has had no prior intubations. His past medical history is significant for asthma.    Past Medical History  Diagnosis Date  . Asthma   . ADHD (attention deficit hyperactivity disorder)     Past Surgical History  Procedure Date  . Tympanoplasty     History reviewed. No pertinent family history.  History  Substance Use Topics  . Smoking status: Never Smoker   . Smokeless tobacco: Not on file  . Alcohol Use: Yes     occasionally      Review of Systems  Constitutional: Negative for fever.  HENT: Negative for sore throat and rhinorrhea.   Eyes: Negative for redness.  Respiratory: Positive  for shortness of breath and wheezing. Negative for cough.   Cardiovascular: Negative for chest pain.  Gastrointestinal: Negative for nausea, vomiting, abdominal pain and diarrhea.  Genitourinary: Negative for dysuria.  Musculoskeletal: Negative for myalgias.  Skin: Negative for rash.  Neurological: Negative for headaches.    Allergies  Amoxicillin-pot clavulanate and Other  Home Medications  No current outpatient prescriptions on file.  BP 136/76  Pulse 102  Temp(Src) 98 F (36.7 C) (Oral)  Resp 20  SpO2 100%  Physical Exam  Nursing note and vitals reviewed. Constitutional: He appears well-developed and well-nourished.  HENT:  Head: Normocephalic and atraumatic.  Right Ear: Tympanic membrane, external ear and ear canal normal.  Left Ear: Tympanic membrane, external ear and ear canal normal.  Nose: Nose normal.  Mouth/Throat: Uvula is midline, oropharynx is clear and moist and mucous membranes are normal.  Eyes: Conjunctivae are normal. Right eye exhibits no discharge. Left eye exhibits no discharge.  Neck: Normal range of motion. Neck supple.  Cardiovascular: Normal rate, regular rhythm and normal heart sounds.   Pulmonary/Chest: Effort normal. He has decreased breath sounds. He has wheezes (expiratory, mild, all fields).  Abdominal: Soft. There is no tenderness.  Neurological: He is alert.  Skin: Skin is warm and dry.  Psychiatric: He has a normal mood and affect.    ED Course  Procedures (including critical care time)  Labs Reviewed - No data to display No results found.   1. Asthma exacerbation     4:12 AM Patient seen and examined. Breathing  treatment ordered.    Vital signs reviewed and are as follows: Filed Vitals:   09/09/11 0248  BP: 136/76  Pulse: 102  Temp: 98 F (36.7 C)  Resp: 20   4:57 AM Patient notes some improvement with breathing treatment. Wheezing is actually somewhat worse now on exam -- likely due to increased air movement. Will do  hour long neb.   6:51 AM Wheezing completely resolved after last treatment. Pt states tightness much improved. States he is feeling much better and he wants to go home. Urged to fill prednisone and take. Urged to return if worsening SOB, worsening work of breathing.   Patient counseled on use of albuterol HFA.  Told to use 1-2 puffs q 4 hours as needed for SOB.   MDM  Asthma exacerbation -- no concern for PNA. Patient much improved in ED. Steroids given by EMS. D/c home with inhaler. Patient much improved. Breathing and appears well.         Renne Crigler, [place] 09/09/11 0657  Renne Crigler, PA 09/09/11 [phone]

## 2011-09-09 NOTE — ED Notes (Signed)
Patient complains of SOB that returned 1 hour ago. Patient was seen in ED at 09/08/11 for same. Patient did not pick up rx.

## 2011-09-09 NOTE — Discharge Instructions (Signed)
Asthma, Acute Bronchospasm  Your exam shows you have asthma, or acute bronchospasm that acts like asthma. Bronchospasm means your air passages become narrowed. These conditions are due to inflammation and airway spasm that cause narrowing of the bronchial tubes in the lungs. This causes you to have wheezing and shortness of breath.  CAUSES    Respiratory infections and allergies most often bring on these attacks. Smoking, air pollution, cold air, emotional upsets, and vigorous exercise can also bring them on.    TREATMENT     Treatment is aimed at making the narrowed airways larger. Mild asthma/bronchospasm is usually controlled with inhaled medicines. Albuterol is a common medicine that you breathe in to open spastic or narrowed airways. Some trade names for albuterol are Ventolin or Proventil. Steroid medicine is also used to reduce the inflammation when an attack is moderate or severe. Antibiotics (medications used to kill germs) are only used if a bacterial infection is present.    If you are pregnant and need to use Albuterol (Ventolin or Proventil), you can expect the baby to move more than usual shortly after the medicine is used.   HOME CARE INSTRUCTIONS     Rest.    Drink plenty of liquids. This helps the mucus to remain thin and easily coughed up. Do not use caffeine or alcohol.    Do not smoke. Avoid being exposed to second-hand smoke.    You play a critical role in keeping yourself in good health. Avoid exposure to things that cause you to wheeze. Avoid exposure to things that cause you to have breathing problems. Keep your medications up-to-date and available. Carefully follow your doctor's treatment plan.    When pollen or pollution is bad, keep windows closed and use an air conditioner go to places with air conditioning. If you are allergic to furry pets or birds, find new homes for them or keep them outside.    Take your medicine exactly as prescribed.     Asthma requires careful medical attention. See your caregiver for follow-up as advised. If you are more than [redacted] weeks pregnant and you were prescribed any new medications, let your Obstetrician know about the visit and how you are doing. Arrange a recheck.   SEEK IMMEDIATE MEDICAL CARE IF:     You are getting worse.    You have trouble breathing. If severe, call 911.    You develop chest pain or discomfort.    You are throwing up or not drinking fluids.    You are not getting better within 24 hours.    You are coughing up yellow, green, brown, or bloody sputum.    You develop a fever over 102 F (38.9 C).    You have trouble swallowing.   MAKE SURE YOU:     Understand these instructions.    Will watch your condition.    Will get help right away if you are not doing well or get worse.   Document Released: 07/24/2006 Document Revised: 03/28/2011 Document Reviewed: 03/23/2007  [employer] Patient Information 2012 [employer].

## 2011-09-09 NOTE — Discharge Instructions (Signed)
Please read and follow all provided instructions.  Your diagnoses today include:  1. Asthma exacerbation     Tests performed today include:  Vital signs. See below for your results today.   Medications prescribed:   Albuterol inhaler - medication that opens up your airway  Use inhaler as follows: 1-2 puffs with spacer every 4 hours as needed for wheezing, cough, or shortness of breath.   Take any prescribed medications only as directed.  Home care instructions:  Follow any educational materials contained in this packet.  Fill the prednisone prescribed previously and take as directed.   Follow-up instructions: Please follow-up with your primary care provider in the next 3 days for further evaluation of your symptoms and management of your asthma. If you do not have a primary care doctor -- see below for referral information.   Return instructions:   Please return to the Emergency Department if you experience worsening symptoms.  Please return with worsening wheezing, shortness of breath, or difficulty breathing.  Return with persistent fever above 101F.   Please return if you have any other emergent concerns.  Additional Information:  Your vital signs today were: BP 136/76  Pulse 103  Temp(Src) 98 F (36.7 C) (Oral)  Resp 18  SpO2 100% If your blood pressure (BP) was elevated above 135/85 this visit, please have this repeated by your doctor within one month. -------------- No Primary Care Doctor Call [facility]  [phone] Other agencies that provide inexpensive medical care    [facility]  [phone]    [facility]  [phone]    [facility]  [phone]    [facility]  [phone]    [facility]  [phone]    [facility]  [phone] -------------- RESOURCE GUIDE:  Dental Problems  Patients with Medicaid: [facility] [phone] [address]                                             [phone] [address] Phone:  [phone]                                                   Phone:  [phone]  If unable to pay or uninsured, contact:  [facility] or [facility]. to become qualified for the adult dental clinic.  Chronic Pain Problems Contact [facility]  [phone] Patients need to be referred by their primary care doctor.  Insufficient Money for Medicine Contact [employer]:  call "211" or [facility] [phone].  Psychological Services [facility]  [phone] [facility]  [phone] [facility]   [phone] (emergency services [phone])  Substance Abuse Resources [facility]  [phone] [facility] [phone] [facility] [place] [phone] [place] [phone] Residential & Outpatient Substance Abuse Program  [phone]  Abuse/Neglect [facility] Child Abuse Hotline 541-561-2975 [facility] Child Abuse Hotline 210-356-9790 (After Hours)  Emergency Shelter [facility] [phone]  Maternity Homes [facility] [place] [facility] [phone] [facility] (  [phone]  [facility] Resources  [facility]     [employer]                          [facility] Dept. [address] [place]                       [address]      [address]  Blondell Reveal Phone:  [phone]                                   Phone:  [phone]                 Phone:  [phone]  [facility] Phone:  [phone]  [facility] Child Abuse Hotline (423)398-2416 [phone] (After Hours)

## 2011-09-10 NOTE — ED Provider Notes (Signed)
Medical screening examination/treatment/procedure(s) were performed by non-physician practitioner and as supervising physician I was immediately available for consultation/collaboration.  Sunnie Nielsen, MD 09/10/11 [phone]

## 2011-09-20 ENCOUNTER — Emergency Department (HOSPITAL_COMMUNITY)
Admission: EM | Admit: 2011-09-20 | Discharge: 2011-09-20 | Disposition: A | Discharge status: Home / Self Care | Payer: Self-pay | Attending: Emergency Medicine | Admitting: Emergency Medicine

## 2011-09-20 ENCOUNTER — Encounter (HOSPITAL_COMMUNITY): Payer: Self-pay | Admitting: *Deleted

## 2011-09-20 DIAGNOSIS — J45901 Unspecified asthma with (acute) exacerbation: Secondary | ICD-10-CM | POA: Insufficient documentation

## 2011-09-20 DIAGNOSIS — F909 Attention-deficit hyperactivity disorder, unspecified type: Secondary | ICD-10-CM | POA: Insufficient documentation

## 2011-09-20 MED ORDER — PREDNISONE 20 MG PO TABS: ORAL_TABLET | ORAL | Status: DC

## 2011-09-20 MED ORDER — ALBUTEROL SULFATE (5 MG/ML) 0.5% IN NEBU
5.0000 mg | INHALATION_SOLUTION | Freq: Once | RESPIRATORY_TRACT | Status: AC
  Administered 2011-09-20: 5 mg via RESPIRATORY_TRACT

## 2011-09-20 MED ORDER — METHYLPREDNISOLONE SODIUM SUCC 125 MG IJ SOLR
INTRAMUSCULAR | Status: AC
  Filled 2011-09-20: qty 2

## 2011-09-20 MED ORDER — ALBUTEROL SULFATE (5 MG/ML) 0.5% IN NEBU
INHALATION_SOLUTION | RESPIRATORY_TRACT | Status: AC
  Filled 2011-09-20: qty 2

## 2011-09-20 MED ORDER — ALBUTEROL SULFATE (5 MG/ML) 0.5% IN NEBU
INHALATION_SOLUTION | RESPIRATORY_TRACT | Status: AC
  Filled 2011-09-20: qty 1

## 2011-09-20 MED ORDER — ALBUTEROL SULFATE HFA 108 (90 BASE) MCG/ACT IN AERS: 1.0000 | INHALATION_SPRAY | Freq: Four times a day (QID) | RESPIRATORY_TRACT | Status: DC | PRN

## 2011-09-20 MED ORDER — ALBUTEROL SULFATE (2.5 MG/3ML) 0.083% IN NEBU: 2.5000 mg | INHALATION_SOLUTION | Freq: Four times a day (QID) | RESPIRATORY_TRACT | Status: DC | PRN

## 2011-09-20 MED ORDER — ALBUTEROL SULFATE (5 MG/ML) 0.5% IN NEBU
5.0000 mg | INHALATION_SOLUTION | Freq: Once | RESPIRATORY_TRACT | Status: AC
  Administered 2011-09-20: 5 mg via RESPIRATORY_TRACT
  Filled 2011-09-20: qty 1

## 2011-09-20 MED ORDER — ALBUTEROL SULFATE HFA 108 (90 BASE) MCG/ACT IN AERS
2.0000 | INHALATION_SPRAY | RESPIRATORY_TRACT | Status: DC | PRN
  Administered 2011-09-20: 2 via RESPIRATORY_TRACT
  Filled 2011-09-20: qty 6.7

## 2011-09-20 MED ORDER — IPRATROPIUM BROMIDE 0.02 % IN SOLN
RESPIRATORY_TRACT | Status: AC
  Filled 2011-09-20: qty 2.5

## 2011-09-20 NOTE — ED Notes (Signed)
Arrived via EMS with c/o difficulty breathing since 0300.  Awakened from sleep with SOB.  Pt used neb tx 20 minutes PTA of EMS.  Neb ts given by EMS for EW and tightness, tripoding and use of accessory muscles.

## 2011-09-20 NOTE — ED Notes (Signed)
C/o productive cough yellow mucous.  Denies fever

## 2011-09-20 NOTE — ED Notes (Signed)
Pt states breathing better but still remains with Expiratory Wheezing bilaterally.  Less tightness, moving more air, rates pain 0 at this time

## 2011-09-20 NOTE — ED Notes (Signed)
Given Solumedrol 125 mg by EMS, Albuterol 10mg  and atrovent 0.5mg 

## 2011-09-20 NOTE — Discharge Instructions (Signed)
Asthma Attack Prevention HOW CAN ASTHMA BE PREVENTED? Currently, there is no way to prevent asthma from starting. However, you can take steps to control the disease and prevent its symptoms after you have been diagnosed. Learn about your asthma and how to control it. Take an active role to control your asthma by working with your caregiver to create and follow an asthma action plan. An asthma action plan guides you in taking your medicines properly, avoiding factors that make your asthma worse, tracking your level of asthma control, responding to worsening asthma, and seeking emergency care when needed. To track your asthma, keep records of your symptoms, check your peak flow number using a peak flow meter (handheld device that shows how well air moves out of your lungs), and get regular asthma checkups.  Other ways to prevent asthma attacks include:  Use medicines as your caregiver directs.   Identify and avoid things that make your asthma worse (as much as you can).   Keep track of your asthma symptoms and level of control.   Get regular checkups for your asthma.   With your caregiver, write a detailed plan for taking medicines and managing an asthma attack. Then be sure to follow your action plan. Asthma is an ongoing condition that needs regular monitoring and treatment.   Identify and avoid asthma triggers. A number of outdoor allergens and irritants (pollen, mold, cold air, air pollution) can trigger asthma attacks. Find out what causes or makes your asthma worse, and take steps to avoid those triggers (see below).   Monitor your breathing. Learn to recognize warning signs of an attack, such as slight coughing, wheezing or shortness of breath. However, your lung function may already decrease before you notice any signs or symptoms, so regularly measure and record your peak airflow with a home peak flow meter.   Identify and treat attacks early. If you act quickly, you're less likely to have  a severe attack. You will also need less medicine to control your symptoms. When your peak flow measurements decrease and alert you to an upcoming attack, take your medicine as instructed, and immediately stop any activity that may have triggered the attack. If your symptoms do not improve, get medical help.   Pay attention to increasing quick-relief inhaler use. If you find yourself relying on your quick-relief inhaler (such as albuterol), your asthma is not under control. See your caregiver about adjusting your treatment.  IDENTIFY AND CONTROL FACTORS THAT MAKE YOUR ASTHMA WORSE A number of common things can set off or make your asthma symptoms worse (asthma triggers). Keep track of your asthma symptoms for several weeks, detailing all the environmental and emotional factors that are linked with your asthma. When you have an asthma attack, go back to your asthma diary to see which factor, or combination of factors, might have contributed to it. Once you know what these factors are, you can take steps to control many of them.  Allergies: If you have allergies and asthma, it is important to take asthma prevention steps at home. Asthma attacks (worsening of asthma symptoms) can be triggered by allergies, which can cause temporary increased inflammation of your airways. Minimizing contact with the substance to which you are allergic will help prevent an asthma attack. Animal Dander:   Some people are allergic to the flakes of skin or dried saliva from animals with fur or feathers. Keep these pets out of your home.   If you can't keep a pet outdoors, keep the   pet out of your bedroom and other sleeping areas at all times, and keep the door closed.   Remove carpets and furniture covered with cloth from your home. If that is not possible, keep the pet away from fabric-covered furniture and carpets.  Dust Mites:  Many people with asthma are allergic to dust mites. Dust mites are tiny bugs that are found in  every home, in mattresses, pillows, carpets, fabric-covered furniture, bedcovers, clothes, stuffed toys, fabric, and other fabric-covered items.   Cover your mattress in a special dust-proof cover.   Cover your pillow in a special dust-proof cover, or wash the pillow each week in hot water. Water must be hotter than 130 F to kill dust mites. Cold or warm water used with detergent and bleach can also be effective.   Wash the sheets and blankets on your bed each week in hot water.   Try not to sleep or lie on cloth-covered cushions.   Call ahead when traveling and ask for a smoke-free hotel room. Bring your own bedding and pillows, in case the hotel only supplies feather pillows and down comforters, which may contain dust mites and cause asthma symptoms.   Remove carpets from your bedroom and those laid on concrete, if you can.   Keep stuffed toys out of the bed, or wash the toys weekly in hot water or cooler water with detergent and bleach.  Cockroaches:  Many people with asthma are allergic to the droppings and remains of cockroaches.   Keep food and garbage in closed containers. Never leave food out.   Use poison baits, traps, powders, gels, or paste (for example, boric acid).   If a spray is used to kill cockroaches, stay out of the room until the odor goes away.  Indoor Mold:  Fix leaky faucets, pipes, or other sources of water that have mold around them.   Clean moldy surfaces with a cleaner that has bleach in it.  Pollen and Outdoor Mold:  When pollen or mold spore counts are high, try to keep your windows closed.   Stay indoors with windows closed from late morning to afternoon, if you can. Pollen and some mold spore counts are highest at that time.   Ask your caregiver whether you need to take or increase anti-inflammatory medicine before your allergy season starts.  Irritants:   Tobacco smoke is an irritant. If you smoke, ask your caregiver how you can quit. Ask family  members to quit smoking, too. Do not allow smoking in your home or car.   If possible, do not use a wood-burning stove, kerosene heater, or fireplace. Minimize exposure to all sources of smoke, including incense, candles, fires, and fireworks.   Try to stay away from strong odors and sprays, such as perfume, talcum powder, hair spray, and paints.   Decrease humidity in your home and use an indoor air cleaning device. Reduce indoor humidity to below 60 percent. Dehumidifiers or central air conditioners can do this.   Try to have someone else vacuum for you once or twice a week, if you can. Stay out of rooms while they are being vacuumed and for a short while afterward.   If you vacuum, use a dust mask from a hardware store, a double-layered or microfilter vacuum cleaner bag, or a vacuum cleaner with a HEPA filter.   Sulfites in foods and beverages can be irritants. Do not drink beer or wine, or eat dried fruit, processed potatoes, or shrimp if they cause asthma   symptoms.   Cold air can trigger an asthma attack. Cover your nose and mouth with a scarf on cold or windy days.   Several health conditions can make asthma more difficult to manage, including runny nose, sinus infections, reflux disease, psychological stress, and sleep apnea. Your caregiver will treat these conditions, as well.   Avoid close contact with people who have a cold or the flu, since your asthma symptoms may get worse if you catch the infection from them. Wash your hands thoroughly after touching items that may have been handled by people with a respiratory infection.   Get a flu shot every year to protect against the flu virus, which often makes asthma worse for days or weeks. Also get a pneumonia shot once every five to 10 years.  Drugs:  Aspirin and other painkillers can cause asthma attacks. 10% to 20% of people with asthma have sensitivity to aspirin or a group of painkillers called non-steroidal anti-inflammatory drugs  (NSAIDS), such as ibuprofen and naproxen. These drugs are used to treat pain and reduce fevers. Asthma attacks caused by any of these medicines can be severe and even fatal. These drugs must be avoided in people who have known aspirin sensitive asthma. Products with acetaminophen are considered safe for people who have asthma. It is important that people with aspirin sensitivity read labels of all over-the-counter drugs used to treat pain, colds, coughs, and fever.   Beta blockers and ACE inhibitors are other drugs which you should discuss with your caregiver, in relation to your asthma.  ALLERGY SKIN TESTING  Ask your asthma caregiver about allergy skin testing or blood testing (RAST test) to identify the allergens to which you are sensitive. If you are found to have allergies, allergy shots (immunotherapy) for asthma may help prevent future allergies and asthma. With allergy shots, small doses of allergens (substances to which you are allergic) are injected under your skin on a regular schedule. Over a period of time, your body may become used to the allergen and less responsive with asthma symptoms. You can also take measures to minimize your exposure to those allergens. EXERCISE  If you have exercise-induced asthma, or are planning vigorous exercise, or exercise in cold, humid, or dry environments, prevent exercise-induced asthma by following your caregiver's advice regarding asthma treatment before exercising. Document Released: 03/27/2009 Document Revised: 03/28/2011 Document Reviewed: 03/27/2009 [employer] Patient Information 2012 [employer]. 

## 2011-09-20 NOTE — ED Provider Notes (Signed)
History     CSN: [id number]  Arrival date & time 09/20/11  0865   First MD Initiated Contact with Patient 09/20/11 0534      Chief Complaint  Patient presents with  . Respiratory Distress    Patient is a 31 y.o. male presenting with shortness of breath. The history is provided by the patient.  Shortness of Breath  The current episode started today. The onset was sudden. The problem occurs frequently. The problem has been gradually worsening. The problem is moderate. The symptoms are relieved by beta-agonist inhalers. The symptoms are aggravated by nothing. Associated symptoms include cough, shortness of breath and wheezing. Pertinent negatives include no chest pain and no fever. His past medical history is significant for asthma.  Patient reports long h/o asthma He reports that tonight he started to have cough/wheeze and shortness of breath similar to prior episodes of asthma He is a nonsmoker He is starting to feel improved after receiving nebs en route Reports chest tightness which is typical for his asthma He denies h/o intubation for asthma  Past Medical History  Diagnosis Date  . Asthma   . ADHD (attention deficit hyperactivity disorder)     Past Surgical History  Procedure Date  . Tympanoplasty     History reviewed. No pertinent family history.  History  Substance Use Topics  . Smoking status: Never Smoker   . Smokeless tobacco: Not on file  . Alcohol Use: Yes     occasionally      Review of Systems  Constitutional: Negative for fever.  Respiratory: Positive for cough, shortness of breath and wheezing.   Cardiovascular: Negative for chest pain.  All other systems reviewed and are negative.    Allergies  Amoxicillin-pot clavulanate and Other  Home Medications   Current Outpatient Rx  Name Route Sig Dispense Refill  . ACETAMINOPHEN 500 MG PO TABS Oral Take 1,000 mg by mouth every 6 (six) hours as needed. For pain    . ALBUTEROL SULFATE (2.5 MG/3ML)  0.083% IN NEBU Nebulization Take 2.5 mg by nebulization every 6 (six) hours as needed. Wheezing/shortness of breath.    . ALBUTEROL SULFATE HFA 108 (90 BASE) MCG/ACT IN AERS Inhalation Inhale 2 puffs into the lungs every 4 (four) hours as needed. Wheezing/shortness of breath.      BP 113/98  Pulse 99  Temp 97.7 F (36.5 C)  Resp 25  SpO2 97%    Physical Exam CONSTITUTIONAL: Well developed/well nourished HEAD AND FACE: Normocephalic/atraumatic EYES: EOMI/PERRL ENMT: Mucous membranes moist NECK: supple no meningeal signs SPINE:entire spine nontender CV: S1/S2 noted, no murmurs/rubs/gallops noted LUNGS: mild tachypnea, diffuse wheezing noted, he is able to speak to me clearly ABDOMEN: soft, nontender, no rebound or guarding GU:no cva tenderness NEURO: Pt is awake/alert, moves all extremitiesx4 EXTREMITIES: pulses normal, full ROM, no edema SKIN: warm, color normal PSYCH: no abnormalities of mood noted  ED Course  Procedures  Pt had improved on arrival, will follow closely 7:14 AM Pt improved but with continued scattered wheezes, he feels he needs one more treatment.  He is able to speak to me comfortably He reports he is unable to afford his daily preventive meds until his medicaid starts He has had multiple ED evaluations recently.  He would like to help prevent recurrence while awaiting his new meds. We have agreed to start on extended oral prednisone and will taper this.  He reports he has only been on prednisone once in the past month and does not take  it chronically.  His albuterol has also been updated  The patient appears reasonably screened and/or stabilized for discharge and I doubt any other medical condition or other [facility] requiring further screening, evaluation, or treatment in the ED at this time prior to discharge.    MDM  Nursing notes reviewed and considered in documentation Previous records reviewed and considered         Joya Gaskins,  MD 09/20/11 [phone]

## 2011-09-22 ENCOUNTER — Emergency Department (HOSPITAL_COMMUNITY)
Admission: EM | Admit: 2011-09-22 | Discharge: 2011-09-22 | Disposition: A | Discharge status: Home / Self Care | Payer: Self-pay | Attending: Emergency Medicine | Admitting: Emergency Medicine

## 2011-09-22 DIAGNOSIS — Z79899 Other long term (current) drug therapy: Secondary | ICD-10-CM | POA: Insufficient documentation

## 2011-09-22 DIAGNOSIS — K529 Noninfective gastroenteritis and colitis, unspecified: Secondary | ICD-10-CM

## 2011-09-22 DIAGNOSIS — J45909 Unspecified asthma, uncomplicated: Secondary | ICD-10-CM | POA: Insufficient documentation

## 2011-09-22 DIAGNOSIS — IMO0002 Reserved for concepts with insufficient information to code with codable children: Secondary | ICD-10-CM | POA: Insufficient documentation

## 2011-09-22 DIAGNOSIS — F909 Attention-deficit hyperactivity disorder, unspecified type: Secondary | ICD-10-CM | POA: Insufficient documentation

## 2011-09-22 DIAGNOSIS — R112 Nausea with vomiting, unspecified: Secondary | ICD-10-CM | POA: Insufficient documentation

## 2011-09-22 LAB — COMPREHENSIVE METABOLIC PANEL
BUN: 17 mg/dL (ref 6–23)
CO2: 22 mEq/L (ref 19–32)
Calcium: 9.1 mg/dL (ref 8.4–10.5)
Creatinine, Ser: 0.87 mg/dL (ref 0.50–1.35)
GFR calc Af Amer: >90 mL/min (ref >90)
GFR calc non Af Amer: >90 mL/min (ref >90)
Glucose, Bld: 83 mg/dL (ref 70–99)
Sodium: 135 mEq/L (ref 135–145)
Total Protein: 6.7 g/dL (ref 6.0–8.3)

## 2011-09-22 LAB — CBC
HCT: 40.6 % (ref 39.0–52.0)
MCH: 30.5 pg (ref 26.0–34.0)
MCV: 87.3 fL (ref 78.0–100.0)
Platelets: 283 10*3/uL (ref 150–400)
RBC: 4.65 MIL/uL (ref 4.22–5.81)

## 2011-09-22 LAB — DIFFERENTIAL
Basophils Absolute: 0 10*3/uL (ref 0.0–0.1)
Eosinophils Absolute: 0.2 10*3/uL (ref 0.0–0.7)
Eosinophils Relative: 3 % (ref 0–5)
Lymphocytes Relative: 30 % (ref 12–46)
Lymphs Abs: 2.9 10*3/uL (ref 0.7–4.0)
Monocytes Absolute: 1 10*3/uL (ref 0.1–1.0)
Monocytes Relative: 10 % (ref 3–12)

## 2011-09-22 MED ORDER — ALBUTEROL SULFATE HFA 108 (90 BASE) MCG/ACT IN AERS
2.0000 | INHALATION_SPRAY | Freq: Once | RESPIRATORY_TRACT | Status: AC
  Administered 2011-09-22: 2 via RESPIRATORY_TRACT
  Filled 2011-09-22: qty 6.7

## 2011-09-22 MED ORDER — ALBUTEROL SULFATE (5 MG/ML) 0.5% IN NEBU
INHALATION_SOLUTION | RESPIRATORY_TRACT | Status: AC
  Administered 2011-09-22: 15:00:00
  Filled 2011-09-22: qty 1

## 2011-09-22 MED ORDER — KETOROLAC TROMETHAMINE 30 MG/ML IJ SOLN
30.0000 mg | Freq: Once | INTRAMUSCULAR | Status: AC
  Administered 2011-09-22: 30 mg via INTRAVENOUS
  Filled 2011-09-22: qty 1

## 2011-09-22 MED ORDER — IPRATROPIUM BROMIDE 0.02 % IN SOLN
RESPIRATORY_TRACT | Status: AC
  Administered 2011-09-22: 15:00:00
  Filled 2011-09-22: qty 2.5

## 2011-09-22 MED ORDER — ONDANSETRON HCL 4 MG/2ML IJ SOLN
4.0000 mg | Freq: Once | INTRAMUSCULAR | Status: AC
  Administered 2011-09-22: 4 mg via INTRAVENOUS
  Filled 2011-09-22: qty 2

## 2011-09-22 MED ORDER — PROMETHAZINE HCL 25 MG PO TABS: 25.0000 mg | ORAL_TABLET | Freq: Four times a day (QID) | ORAL | Status: DC | PRN

## 2011-09-22 MED ORDER — SODIUM CHLORIDE 0.9 % IV SOLN
Freq: Once | INTRAVENOUS | Status: AC
  Administered 2011-09-22: 16:00:00 via INTRAVENOUS

## 2011-09-22 NOTE — Discharge Instructions (Signed)
Viral Gastroenteritis Viral gastroenteritis is also known as stomach flu. This condition affects the stomach and intestinal tract. It can cause sudden diarrhea and vomiting. The illness typically lasts 3 to 8 days. Most people develop an immune response that eventually gets rid of the virus. While this natural response develops, the virus can make you quite ill. CAUSES  Many different viruses can cause gastroenteritis, such as rotavirus or noroviruses. You can catch one of these viruses by consuming contaminated food or water. You may also catch a virus by sharing utensils or other personal items with an infected person or by touching a contaminated surface. SYMPTOMS  The most common symptoms are diarrhea and vomiting. These problems can cause a severe loss of body fluids (dehydration) and a body salt (electrolyte) imbalance. Other symptoms may include:  Fever.   Headache.   Fatigue.   Abdominal pain.  DIAGNOSIS  Your caregiver can usually diagnose viral gastroenteritis based on your symptoms and a physical exam. A stool sample may also be taken to test for the presence of viruses or other infections. TREATMENT  This illness typically goes away on its own. Treatments are aimed at rehydration. The most serious cases of viral gastroenteritis involve vomiting so severely that you are not able to keep fluids down. In these cases, fluids must be given through an intravenous line (IV). HOME CARE INSTRUCTIONS   Drink enough fluids to keep your urine clear or pale yellow. Drink small amounts of fluids frequently and increase the amounts as tolerated.   Ask your caregiver for specific rehydration instructions.   Avoid:   Foods high in sugar.   Alcohol.   Carbonated drinks.   Tobacco.   Juice.   Caffeine drinks.   Extremely hot or cold fluids.   Fatty, greasy foods.   Too much intake of anything at one time.   Dairy products until 24 to 48 hours after diarrhea stops.   You may  consume probiotics. Probiotics are active cultures of beneficial bacteria. They may lessen the amount and number of diarrheal stools in adults. Probiotics can be found in yogurt with active cultures and in supplements.   Wash your hands well to avoid spreading the virus.   Only take over-the-counter or prescription medicines for pain, discomfort, or fever as directed by your caregiver. Do not give aspirin to children. Antidiarrheal medicines are not recommended.   Ask your caregiver if you should continue to take your regular prescribed and over-the-counter medicines.   Keep all follow-up appointments as directed by your caregiver.  SEEK IMMEDIATE MEDICAL CARE IF:   You are unable to keep fluids down.   You do not urinate at least once every 6 to 8 hours.   You develop shortness of breath.   You notice blood in your stool or vomit. This may look like coffee grounds.   You have abdominal pain that increases or is concentrated in one small area (localized).   You have persistent vomiting or diarrhea.   You have a fever.   The patient is a child younger than 3 months, and he or she has a fever.   The patient is a child older than 3 months, and he or she has a fever and persistent symptoms.   The patient is a child older than 3 months, and he or she has a fever and symptoms suddenly get worse.   The patient is a baby, and he or she has no tears when crying.  MAKE SURE YOU:     Understand these instructions.   Will watch your condition.   Will get help right away if you are not doing well or get worse.  Document Released: 04/08/2005 Document Revised: 03/28/2011 Document Reviewed: 01/23/2011 [employer] Patient Information 2012 [employer]. 

## 2011-09-22 NOTE — ED Notes (Signed)
Per EMS, pt states he has been having nausea and vomiting for two days. Also, during the last two hours, he states that his asthma has flared.

## 2011-09-22 NOTE — ED Notes (Signed)
ZOX:[id number]<AV> Expected date:09/22/11<BR> Expected time: 2:40 PM<BR> Means of arrival:Ambulance<BR> Comments:<BR> M31. 28 m. Sick; n/v, SOB, wheezing (hx of asthma). 15 mins

## 2011-09-22 NOTE — ED Provider Notes (Signed)
History     CSN: [id number]  Arrival date & time 09/22/11  1448   First MD Initiated Contact with Patient 09/22/11 1508      Chief Complaint  Patient presents with  . Nausea  . Emesis  . Asthma    (Consider location/radiation/quality/duration/timing/severity/associated sxs/prior treatment) HPI Comments: Thinks he may have eaten a bad piece of lunch meat.    Has been here recently for asthma several times.  Patient is a 31 y.o. male presenting with vomiting. The history is provided by the patient.  Emesis  This is a new problem. Episode onset: this morning. The problem occurs continuously. The problem has not changed since onset.The emesis has an appearance of stomach contents. There has been no fever. Pertinent negatives include no diarrhea and no fever.    Past Medical History  Diagnosis Date  . Asthma   . ADHD (attention deficit hyperactivity disorder)     Past Surgical History  Procedure Date  . Tympanoplasty     No family history on file.  History  Substance Use Topics  . Smoking status: Never Smoker   . Smokeless tobacco: Not on file  . Alcohol Use: Yes     occasionally      Review of Systems  Constitutional: Negative for fever.  Gastrointestinal: Positive for vomiting. Negative for diarrhea.  All other systems reviewed and are negative.    Allergies  Amoxicillin-pot clavulanate and Other  Home Medications   Current Outpatient Rx  Name Route Sig Dispense Refill  . ALBUTEROL SULFATE (2.5 MG/3ML) 0.083% IN NEBU Nebulization Take 2.5 mg by nebulization every 6 (six) hours as needed. Wheezing/shortness of breath.    . ALBUTEROL SULFATE (2.5 MG/3ML) 0.083% IN NEBU Nebulization Take 3 mLs (2.5 mg total) by nebulization every 6 (six) hours as needed for wheezing. 75 mL 12  . PREDNISONE 20 MG PO TABS  Take 3 tablets PO daily for 4 days, then two tablets PO daily for 4 days, then one tablet PO daily for 4 days, then half tablet PO daily for 2 days then stop  25 tablet 0    BP 118/76  Pulse 79  Temp(Src) 98.7 F (37.1 C) (Oral)  Resp 20  SpO2 98%  Physical Exam  Nursing note and vitals reviewed. Constitutional: He is oriented to person, place, and time. He appears well-developed and well-nourished. No distress.  HENT:  Head: Normocephalic and atraumatic.  Neck: Normal range of motion. Neck supple.  Cardiovascular: Normal rate and regular rhythm.   No murmur heard. Pulmonary/Chest: Effort normal and breath sounds normal. No respiratory distress. He has no wheezes.  Abdominal: Soft. Bowel sounds are normal. He exhibits no distension. There is no tenderness.  Musculoskeletal: Normal range of motion.  Neurological: He is alert and oriented to person, place, and time. No cranial nerve deficit.  Skin: Skin is warm and dry. He is not diaphoretic.    ED Course  Procedures (including critical care time)   Labs Reviewed  CBC  DIFFERENTIAL  COMPREHENSIVE METABOLIC PANEL   No results found.   No diagnosis found.    MDM  The labs look okay and the patient appears clinically well.  He is not wheezing and the oxygen saturations are okay.  He will be discharged to home, to return prn if worsens.  I suspect his symptoms are related to gastroenteritis.        Geoffery Lyons, MD 09/22/11 [phone]

## 2011-09-26 ENCOUNTER — Encounter (HOSPITAL_COMMUNITY): Payer: Self-pay | Admitting: Emergency Medicine

## 2011-09-26 ENCOUNTER — Emergency Department (HOSPITAL_COMMUNITY)
Admission: EM | Admit: 2011-09-26 | Discharge: 2011-09-26 | Disposition: A | Discharge status: Home / Self Care | Payer: Self-pay | Attending: Emergency Medicine | Admitting: Emergency Medicine

## 2011-09-26 DIAGNOSIS — F909 Attention-deficit hyperactivity disorder, unspecified type: Secondary | ICD-10-CM | POA: Insufficient documentation

## 2011-09-26 DIAGNOSIS — J45901 Unspecified asthma with (acute) exacerbation: Secondary | ICD-10-CM | POA: Insufficient documentation

## 2011-09-26 MED ORDER — PREDNISONE 20 MG PO TABS: 60.0000 mg | ORAL_TABLET | Freq: Every day | ORAL | Status: DC

## 2011-09-26 MED ORDER — IPRATROPIUM BROMIDE 0.02 % IN SOLN
0.5000 mg | Freq: Once | RESPIRATORY_TRACT | Status: AC
  Administered 2011-09-26: 0.5 mg via RESPIRATORY_TRACT
  Filled 2011-09-26: qty 2.5

## 2011-09-26 MED ORDER — ALBUTEROL SULFATE (5 MG/ML) 0.5% IN NEBU
5.0000 mg | INHALATION_SOLUTION | Freq: Once | RESPIRATORY_TRACT | Status: AC
  Administered 2011-09-26: 5 mg via RESPIRATORY_TRACT
  Filled 2011-09-26: qty 1

## 2011-09-26 MED ORDER — ALBUTEROL SULFATE HFA 108 (90 BASE) MCG/ACT IN AERS
2.0000 | INHALATION_SPRAY | RESPIRATORY_TRACT | Status: DC | PRN
  Administered 2011-09-26: 2 via RESPIRATORY_TRACT
  Filled 2011-09-26: qty 6.7

## 2011-09-26 MED ORDER — PREDNISONE 20 MG PO TABS
60.0000 mg | ORAL_TABLET | Freq: Once | ORAL | Status: AC
  Administered 2011-09-26: 60 mg via ORAL
  Filled 2011-09-26: qty 2
  Filled 2011-09-26: qty 1

## 2011-09-26 NOTE — ED Notes (Signed)
Pt currently asleep; no s/s of distress noted. 

## 2011-09-26 NOTE — Discharge Instructions (Signed)

## 2011-09-26 NOTE — ED Notes (Signed)
Pt c/o asthma attack pta; pt now with no s/s of distress. O2 sats 99% room air currently.

## 2011-09-26 NOTE — ED Provider Notes (Signed)
History     CSN: [id number]  Arrival date & time 09/26/11  0340   First MD Initiated Contact with Patient 09/26/11 0503      Chief Complaint  Patient presents with  . Asthma    pt with asthma flare up. Per EMS, pt with diminished lung sounds PTA but now clear and WNL. No acute ditress noted upon arrival to ER.    (Consider location/radiation/quality/duration/timing/severity/associated sxs/prior treatment) HPI Comments: Patient comes in today via EMS with a chief complaint of wheezing, shortness of breath, and tightness in his chest.  Symptoms began just prior to calling EMS.   He was given a breathing treatment prior to arrival by EMS, which he felt helped.  He has a history of asthma.  He reports that he ran out of his Albuterol inhaler a few months ago.  He reports that at this time he is feeling much better, but is still feeling some tightness in his chest.  He is requesting another breathing treatment.  Patient is a 31 y.o. male presenting with asthma. The history is provided by the patient.  Asthma This is a new problem. The current episode started today. The problem occurs constantly. Associated symptoms include coughing. Pertinent negatives include no abdominal pain, chest pain, chills, diaphoresis, fever, nausea or vomiting. The symptoms are aggravated by nothing. He has tried nothing for the symptoms.    Past Medical History  Diagnosis Date  . Asthma   . ADHD (attention deficit hyperactivity disorder)     Past Surgical History  Procedure Date  . Tympanoplasty     History reviewed. No pertinent family history.  History  Substance Use Topics  . Smoking status: Never Smoker   . Smokeless tobacco: Not on file  . Alcohol Use: No     occasionally      Review of Systems  Constitutional: Negative for fever, chills and diaphoresis.  Respiratory: Positive for cough, chest tightness and wheezing.   Cardiovascular: Negative for chest pain and palpitations.    Gastrointestinal: Negative for nausea, vomiting and abdominal pain.  Neurological: Negative for dizziness, syncope and light-headedness.    Allergies  Amoxicillin-pot clavulanate and Other  Home Medications   Current Outpatient Rx  Name Route Sig Dispense Refill  . ALBUTEROL SULFATE (2.5 MG/3ML) 0.083% IN NEBU Nebulization Take 2.5 mg by nebulization every 6 (six) hours as needed. Wheezing/shortness of breath.    . ALBUTEROL SULFATE (2.5 MG/3ML) 0.083% IN NEBU Nebulization Take 3 mLs (2.5 mg total) by nebulization every 6 (six) hours as needed for wheezing. 75 mL 12  . PREDNISONE 20 MG PO TABS  Take 3 tablets PO daily for 4 days, then two tablets PO daily for 4 days, then one tablet PO daily for 4 days, then half tablet PO daily for 2 days then stop 25 tablet 0  . PROMETHAZINE HCL 25 MG PO TABS Oral Take 1 tablet (25 mg total) by mouth every 6 (six) hours as needed for nausea. 10 tablet 0    BP 129/69  Pulse 81  Temp(Src) 97.8 F (36.6 C) (Oral)  Resp 18  Ht 5\' 9"  (1.753 m)  Wt 200 lb (90.719 kg)  BMI 29.53 kg/m2  SpO2 99%  Physical Exam  Nursing note and vitals reviewed. Constitutional: He appears well-developed and well-nourished. No distress.  HENT:  Head: Normocephalic and atraumatic.  Mouth/Throat: Oropharynx is clear and moist.  Cardiovascular: Normal rate, regular rhythm and normal heart sounds.   Pulmonary/Chest: Effort normal and breath sounds normal.  No respiratory distress. He has no wheezes. He has no rales. He exhibits no tenderness.  Musculoskeletal: Normal range of motion.  Neurological: He is alert.  Skin: Skin is warm and dry. He is not diaphoretic.  Psychiatric: He has a normal mood and affect.    ED Course  Procedures (including critical care time)  Labs Reviewed - No data to display No results found.   No diagnosis found.  6:02 AM Reassessed patient.  He reports that he is feeling "a whole lot better"  Lungs CTAB.    MDM  Patient presenting  with asthma exacerbation.   Symptoms improved after hospital treatment. Pt states they are breathing at baseline. Pt has been instructed to continue using prescribed medications and to speak with PCP about today's exacerbation.  Patient given prescription for Prednisone and given Albuterol inhaler while in ED.          Pascal Lux [place], PA-C 09/29/11 2253

## 2011-09-26 NOTE — ED Notes (Signed)
ZOX:[id number]<AV> Expected date:09/26/11<BR> Expected time: 3:01 AM<BR> Means of arrival:Ambulance<BR> Comments:<BR> Asthma, wheezing

## 2011-09-29 ENCOUNTER — Emergency Department (HOSPITAL_COMMUNITY)
Admission: EM | Admit: 2011-09-29 | Discharge: 2011-09-29 | Disposition: A | Discharge status: Home / Self Care | Payer: Self-pay | Attending: Emergency Medicine | Admitting: Emergency Medicine

## 2011-09-29 ENCOUNTER — Encounter (HOSPITAL_COMMUNITY): Payer: Self-pay | Admitting: *Deleted

## 2011-09-29 DIAGNOSIS — J45901 Unspecified asthma with (acute) exacerbation: Secondary | ICD-10-CM | POA: Insufficient documentation

## 2011-09-29 DIAGNOSIS — F909 Attention-deficit hyperactivity disorder, unspecified type: Secondary | ICD-10-CM | POA: Insufficient documentation

## 2011-09-29 MED ORDER — IPRATROPIUM BROMIDE 0.02 % IN SOLN
RESPIRATORY_TRACT | Status: AC
  Administered 2011-09-29: 16:00:00
  Filled 2011-09-29: qty 2.5

## 2011-09-29 MED ORDER — AEROCHAMBER Z-STAT PLUS/MEDIUM MISC
1.0000 | Freq: Once | Status: AC
  Administered 2011-09-29: 1

## 2011-09-29 MED ORDER — ALBUTEROL SULFATE HFA 108 (90 BASE) MCG/ACT IN AERS
2.0000 | INHALATION_SPRAY | RESPIRATORY_TRACT | Status: DC | PRN
  Administered 2011-09-29: 2 via RESPIRATORY_TRACT
  Filled 2011-09-29: qty 6.7

## 2011-09-29 MED ORDER — ALBUTEROL SULFATE (5 MG/ML) 0.5% IN NEBU
INHALATION_SOLUTION | RESPIRATORY_TRACT | Status: AC
  Administered 2011-09-29: 16:00:00
  Filled 2011-09-29: qty 2

## 2011-09-29 MED ORDER — ALBUTEROL SULFATE (5 MG/ML) 0.5% IN NEBU
5.0000 mg | INHALATION_SOLUTION | Freq: Once | RESPIRATORY_TRACT | Status: AC
  Administered 2011-09-29: 5 mg via RESPIRATORY_TRACT
  Filled 2011-09-29: qty 1

## 2011-09-29 MED ORDER — METHYLPREDNISOLONE SODIUM SUCC 125 MG IJ SOLR
INTRAMUSCULAR | Status: AC
  Administered 2011-09-29: 16:00:00
  Filled 2011-09-29: qty 2

## 2011-09-29 MED ORDER — PREDNISONE 20 MG PO TABS: 60.0000 mg | ORAL_TABLET | Freq: Every day | ORAL | Status: DC

## 2011-09-29 NOTE — ED Provider Notes (Signed)
History     CSN: [id number]  Arrival date & time 09/29/11  1439   First MD Initiated Contact with Patient 09/29/11 1507      Chief Complaint  Patient presents with  . Shortness of Breath    (Consider location/radiation/quality/duration/timing/severity/associated sxs/prior treatment) HPI Comments: Shortness of breath as the patient has a history of asthma. He has prescriptions for prednisone and albuterol but has no money to fill them. Receive Solu-Medrol and albuterol and Atrovent prior to arrival.  Patient is a 31 y.o. male presenting with shortness of breath. The history is provided by the patient. No language interpreter was used.  Shortness of Breath  The current episode started yesterday. The onset was gradual. The problem occurs continuously. The problem has been gradually worsening. The problem is moderate. The symptoms are relieved by nothing. The symptoms are aggravated by activity. Associated symptoms include cough, shortness of breath and wheezing. Pertinent negatives include no chest pain, no chest pressure, no orthopnea, no fever, no rhinorrhea and no sore throat. The cough's precipitants include activity. The cough is dry. Nothing relieves the cough. Nothing worsens the cough. He was not exposed to toxic fumes. He has not inhaled smoke recently. He has had intermittent steroid use. He has had prior hospitalizations. He has had no prior ICU admissions. He has had no prior intubations. His past medical history is significant for asthma. Urine output has been normal.    Past Medical History  Diagnosis Date  . Asthma   . ADHD (attention deficit hyperactivity disorder)     Past Surgical History  Procedure Date  . Tympanoplasty     History reviewed. No pertinent family history.  History  Substance Use Topics  . Smoking status: Never Smoker   . Smokeless tobacco: Not on file  . Alcohol Use: No     occasionally      Review of Systems  Constitutional: Negative for  fever, chills, activity change, appetite change and fatigue.  HENT: Negative for congestion, sore throat, rhinorrhea, neck pain and neck stiffness.   Respiratory: Positive for cough, shortness of breath and wheezing. Negative for chest tightness.   Cardiovascular: Negative for chest pain, palpitations and orthopnea.  Gastrointestinal: Negative for nausea, vomiting, abdominal pain, diarrhea and constipation.  Genitourinary: Negative for dysuria, urgency, frequency and flank pain.  Musculoskeletal: Negative for myalgias, back pain and arthralgias.  Neurological: Negative for dizziness, weakness, light-headedness, numbness and headaches.  All other systems reviewed and are negative.    Allergies  Amoxicillin-pot clavulanate and Other  Home Medications   Current Outpatient Rx  Name Route Sig Dispense Refill  . ALBUTEROL SULFATE (2.5 MG/3ML) 0.083% IN NEBU Nebulization Take 2.5 mg by nebulization every 6 (six) hours as needed. Pain.    . IBUPROFEN 100 MG PO TABS Oral Take 400 mg by mouth every 6 (six) hours as needed.    Marland Kitchen PREDNISONE 20 MG PO TABS  Take 3 tablets PO daily for 4 days, then two tablets PO daily for 4 days, then one tablet PO daily for 4 days, then half tablet PO daily for 2 days then stop 25 tablet 0  . PREDNISONE 20 MG PO TABS Oral Take 3 tablets (60 mg total) by mouth daily. 15 tablet 0  . PREDNISONE 20 MG PO TABS Oral Take 3 tablets (60 mg total) by mouth daily. 15 tablet 0  . PROMETHAZINE HCL 25 MG PO TABS Oral Take 1 tablet (25 mg total) by mouth every 6 (six) hours as needed for  nausea. 10 tablet 0    BP 125/81  Pulse 81  Temp(Src) 98.3 F (36.8 C) (Oral)  Resp 16  SpO2 97%  Physical Exam  Nursing note and vitals reviewed. Constitutional: He is oriented to person, place, and time. He appears well-developed and well-nourished. No distress.  HENT:  Head: Normocephalic and atraumatic.  Mouth/Throat: Oropharynx is clear and moist.  Eyes: Conjunctivae and EOM are  normal. Pupils are equal, round, and reactive to light.  Neck: Normal range of motion. Neck supple.  Cardiovascular: Normal rate, regular rhythm, normal heart sounds and intact distal pulses.  Exam reveals no gallop and no friction rub.   No murmur heard. Pulmonary/Chest: Effort normal. No respiratory distress. He has wheezes (faint wheezing with good air exchange).  Abdominal: Soft. Bowel sounds are normal. There is no tenderness. There is no rebound and no guarding.  Musculoskeletal: Normal range of motion. He exhibits no edema and no tenderness.  Neurological: He is alert and oriented to person, place, and time. No cranial nerve deficit.  Skin: Skin is warm and dry. No rash noted.    ED Course  Procedures (including critical care time)  Labs Reviewed - No data to display No results found.   1. Asthma exacerbation       MDM  Has an asthma exacerbation with resolution after an additional breathing treatment. Receive Solu-Medrol by EMS. Will be discharged home with prednisone. Has albuterol solution prescription at home which she was unable to fill. We'll provide an albuterol inhaler. No indication for laboratory testing at this time.        Dayton Bailiff, MD 09/29/11 [id number]

## 2011-09-29 NOTE — ED Notes (Signed)
Pt reports dental pain that is the result of a tooth abscess in the back of his mouth.

## 2011-09-29 NOTE — ED Notes (Signed)
ZOX:[id number]<AV> Expected date:09/29/11<BR> Expected time: 2:28 PM<BR> Means of arrival:Ambulance<BR> Comments:<BR> M11. 30 m. SOB, asthma. Stable. 15 mins

## 2011-09-29 NOTE — Discharge Instructions (Signed)

## 2011-09-29 NOTE — ED Notes (Signed)
Per EMS report; pt from home: initially c/o of SOB and had wheezing, after 10 mg albuterol, 0.5 atrovent, 125 solumedrol IV no more wheezing, started last night and was transported for tx, ran out of albuterol last night, getting worse over last two days, 20g in Left AC; VS: 116/80, HR 68, NSR, RR:20, 99% O2 on Tx

## 2011-09-30 MED FILL — Ipratropium Bromide Inhal Soln 0.02%: RESPIRATORY_TRACT | Qty: 2.5 | Status: AC

## 2011-09-30 MED FILL — Albuterol Sulfate Inhal Aero 108 MCG/ACT (90MCG Base Equiv): RESPIRATORY_TRACT | Qty: 6.7 | Status: AC

## 2011-09-30 MED FILL — Albuterol Sulfate Soln Nebu 0.5% (5 MG/ML): RESPIRATORY_TRACT | Qty: 3 | Status: AC

## 2011-09-30 MED FILL — Methylprednisolone Sod Succ For Inj 125 MG (Base Equiv): INTRAMUSCULAR | Qty: 1 | Status: AC

## 2011-10-01 NOTE — ED Provider Notes (Signed)
Medical screening examination/treatment/procedure(s) were performed by non-physician practitioner and as supervising physician I was immediately available for consultation/collaboration.   Sunnie Nielsen, MD 10/01/11 [phone]

## 2011-10-02 ENCOUNTER — Emergency Department (HOSPITAL_COMMUNITY)
Admission: EM | Admit: 2011-10-02 | Discharge: 2011-10-02 | Disposition: A | Discharge status: Home / Self Care | Payer: Self-pay | Attending: Emergency Medicine | Admitting: Emergency Medicine

## 2011-10-02 ENCOUNTER — Encounter (HOSPITAL_COMMUNITY): Payer: Self-pay | Admitting: Emergency Medicine

## 2011-10-02 DIAGNOSIS — J45901 Unspecified asthma with (acute) exacerbation: Secondary | ICD-10-CM | POA: Insufficient documentation

## 2011-10-02 MED ORDER — ALBUTEROL SULFATE (5 MG/ML) 0.5% IN NEBU
5.0000 mg | INHALATION_SOLUTION | Freq: Once | RESPIRATORY_TRACT | Status: AC
  Administered 2011-10-02: 5 mg via RESPIRATORY_TRACT
  Filled 2011-10-02: qty 1

## 2011-10-02 MED ORDER — IPRATROPIUM BROMIDE 0.02 % IN SOLN
0.5000 mg | Freq: Once | RESPIRATORY_TRACT | Status: AC
  Administered 2011-10-02: 0.5 mg via RESPIRATORY_TRACT
  Filled 2011-10-02: qty 2.5

## 2011-10-02 MED ORDER — ALBUTEROL SULFATE HFA 108 (90 BASE) MCG/ACT IN AERS
2.0000 | INHALATION_SPRAY | Freq: Once | RESPIRATORY_TRACT | Status: AC
  Administered 2011-10-02: 2 via RESPIRATORY_TRACT
  Filled 2011-10-02: qty 6.7

## 2011-10-02 MED ORDER — ALBUTEROL SULFATE (5 MG/ML) 0.5% IN NEBU
INHALATION_SOLUTION | RESPIRATORY_TRACT | Status: AC
  Administered 2011-10-02: 05:00:00
  Filled 2011-10-02: qty 1

## 2011-10-02 MED ORDER — IPRATROPIUM BROMIDE 0.02 % IN SOLN
RESPIRATORY_TRACT | Status: AC
  Administered 2011-10-02: 05:00:00
  Filled 2011-10-02: qty 2.5

## 2011-10-02 MED ORDER — METHYLPREDNISOLONE SODIUM SUCC 125 MG IJ SOLR
INTRAMUSCULAR | Status: AC
  Administered 2011-10-02: 05:00:00
  Filled 2011-10-02: qty 2

## 2011-10-02 NOTE — Discharge Instructions (Signed)
Asthma Attack Prevention HOW CAN ASTHMA BE PREVENTED? Currently, there is no way to prevent asthma from starting. However, you can take steps to control the disease and prevent its symptoms after you have been diagnosed. Learn about your asthma and how to control it. Take an active role to control your asthma by working with your caregiver to create and follow an asthma action plan. An asthma action plan guides you in taking your medicines properly, avoiding factors that make your asthma worse, tracking your level of asthma control, responding to worsening asthma, and seeking emergency care when needed. To track your asthma, keep records of your symptoms, check your peak flow number using a peak flow meter (handheld device that shows how well air moves out of your lungs), and get regular asthma checkups.  Other ways to prevent asthma attacks include:  Use medicines as your caregiver directs.   Identify and avoid things that make your asthma worse (as much as you can).   Keep track of your asthma symptoms and level of control.   Get regular checkups for your asthma.   With your caregiver, write a detailed plan for taking medicines and managing an asthma attack. Then be sure to follow your action plan. Asthma is an ongoing condition that needs regular monitoring and treatment.   Identify and avoid asthma triggers. A number of outdoor allergens and irritants (pollen, mold, cold air, air pollution) can trigger asthma attacks. Find out what causes or makes your asthma worse, and take steps to avoid those triggers (see below).   Monitor your breathing. Learn to recognize warning signs of an attack, such as slight coughing, wheezing or shortness of breath. However, your lung function may already decrease before you notice any signs or symptoms, so regularly measure and record your peak airflow with a home peak flow meter.   Identify and treat attacks early. If you act quickly, you're less likely to have  a severe attack. You will also need less medicine to control your symptoms. When your peak flow measurements decrease and alert you to an upcoming attack, take your medicine as instructed, and immediately stop any activity that may have triggered the attack. If your symptoms do not improve, get medical help.   Pay attention to increasing quick-relief inhaler use. If you find yourself relying on your quick-relief inhaler (such as albuterol), your asthma is not under control. See your caregiver about adjusting your treatment.  IDENTIFY AND CONTROL FACTORS THAT MAKE YOUR ASTHMA WORSE A number of common things can set off or make your asthma symptoms worse (asthma triggers). Keep track of your asthma symptoms for several weeks, detailing all the environmental and emotional factors that are linked with your asthma. When you have an asthma attack, go back to your asthma diary to see which factor, or combination of factors, might have contributed to it. Once you know what these factors are, you can take steps to control many of them.  Allergies: If you have allergies and asthma, it is important to take asthma prevention steps at home. Asthma attacks (worsening of asthma symptoms) can be triggered by allergies, which can cause temporary increased inflammation of your airways. Minimizing contact with the substance to which you are allergic will help prevent an asthma attack. Animal Dander:   Some people are allergic to the flakes of skin or dried saliva from animals with fur or feathers. Keep these pets out of your home.   If you can't keep a pet outdoors, keep the   pet out of your bedroom and other sleeping areas at all times, and keep the door closed.   Remove carpets and furniture covered with cloth from your home. If that is not possible, keep the pet away from fabric-covered furniture and carpets.  Dust Mites:  Many people with asthma are allergic to dust mites. Dust mites are tiny bugs that are found in  every home, in mattresses, pillows, carpets, fabric-covered furniture, bedcovers, clothes, stuffed toys, fabric, and other fabric-covered items.   Cover your mattress in a special dust-proof cover.   Cover your pillow in a special dust-proof cover, or wash the pillow each week in hot water. Water must be hotter than 130 F to kill dust mites. Cold or warm water used with detergent and bleach can also be effective.   Wash the sheets and blankets on your bed each week in hot water.   Try not to sleep or lie on cloth-covered cushions.   Call ahead when traveling and ask for a smoke-free hotel room. Bring your own bedding and pillows, in case the hotel only supplies feather pillows and down comforters, which may contain dust mites and cause asthma symptoms.   Remove carpets from your bedroom and those laid on concrete, if you can.   Keep stuffed toys out of the bed, or wash the toys weekly in hot water or cooler water with detergent and bleach.  Cockroaches:  Many people with asthma are allergic to the droppings and remains of cockroaches.   Keep food and garbage in closed containers. Never leave food out.   Use poison baits, traps, powders, gels, or paste (for example, boric acid).   If a spray is used to kill cockroaches, stay out of the room until the odor goes away.  Indoor Mold:  Fix leaky faucets, pipes, or other sources of water that have mold around them.   Clean moldy surfaces with a cleaner that has bleach in it.  Pollen and Outdoor Mold:  When pollen or mold spore counts are high, try to keep your windows closed.   Stay indoors with windows closed from late morning to afternoon, if you can. Pollen and some mold spore counts are highest at that time.   Ask your caregiver whether you need to take or increase anti-inflammatory medicine before your allergy season starts.  Irritants:   Tobacco smoke is an irritant. If you smoke, ask your caregiver how you can quit. Ask family  members to quit smoking, too. Do not allow smoking in your home or car.   If possible, do not use a wood-burning stove, kerosene heater, or fireplace. Minimize exposure to all sources of smoke, including incense, candles, fires, and fireworks.   Try to stay away from strong odors and sprays, such as perfume, talcum powder, hair spray, and paints.   Decrease humidity in your home and use an indoor air cleaning device. Reduce indoor humidity to below 60 percent. Dehumidifiers or central air conditioners can do this.   Try to have someone else vacuum for you once or twice a week, if you can. Stay out of rooms while they are being vacuumed and for a short while afterward.   If you vacuum, use a dust mask from a hardware store, a double-layered or microfilter vacuum cleaner bag, or a vacuum cleaner with a HEPA filter.   Sulfites in foods and beverages can be irritants. Do not drink beer or wine, or eat dried fruit, processed potatoes, or shrimp if they cause asthma   wine, or eat dried fruit, processed potatoes, or shrimp if they cause asthma symptoms.   Cold air can trigger an asthma attack. Cover your nose and mouth with a scarf on cold or windy days.   Several health conditions can make asthma more difficult to manage, including runny nose, sinus infections, reflux disease, psychological stress, and sleep apnea. Your caregiver will treat these conditions, as well.   Avoid close contact with people who have a cold or the flu, since your asthma symptoms may get worse if you catch the infection from them. Wash your hands thoroughly after touching items that may have been handled by people with a respiratory infection.   Get a flu shot every year to protect against the flu virus, which often makes asthma worse for days or weeks. Also get a pneumonia shot once every five to 10 years.  Drugs:   Aspirin and other painkillers can cause asthma attacks. 10% to 20% of people with asthma have sensitivity to aspirin or a group of painkillers called non-steroidal anti-inflammatory drugs (NSAIDS), such as ibuprofen  and naproxen. These drugs are used to treat pain and reduce fevers. Asthma attacks caused by any of these medicines can be severe and even fatal. These drugs must be avoided in people who have known aspirin sensitive asthma. Products with acetaminophen are considered safe for people who have asthma. It is important that people with aspirin sensitivity read labels of all over-the-counter drugs used to treat pain, colds, coughs, and fever.   Beta blockers and ACE inhibitors are other drugs which you should discuss with your caregiver, in relation to your asthma.  ALLERGY SKIN TESTING   Ask your asthma caregiver about allergy skin testing or blood testing (RAST test) to identify the allergens to which you are sensitive. If you are found to have allergies, allergy shots (immunotherapy) for asthma may help prevent future allergies and asthma. With allergy shots, small doses of allergens (substances to which you are allergic) are injected under your skin on a regular schedule. Over a period of time, your body may become used to the allergen and less responsive with asthma symptoms. You can also take measures to minimize your exposure to those allergens.  EXERCISE   If you have exercise-induced asthma, or are planning vigorous exercise, or exercise in cold, humid, or dry environments, prevent exercise-induced asthma by following your caregiver's advice regarding asthma treatment before exercising.  Document Released: 03/27/2009 Document Revised: 03/28/2011 Document Reviewed: 03/27/2009  [employer] Patient Information 2012 [employer]      Asthma, Adult  Asthma is caused by narrowing of the air passages in the lungs. It may be triggered by pollen, dust, animal dander, molds, some foods, respiratory infections, exposure to smoke, exercise, emotional stress or other allergens (things that cause allergic reactions or allergies). Repeat attacks are common.  HOME CARE INSTRUCTIONS    Use prescription medications as ordered  by your caregiver.   Avoid pollen, dust, animal dander, molds, smoke and other things that cause attacks at home and at work.   You may have fewer attacks if you decrease dust in your home. Electrostatic air cleaners may help.   It may help to replace your pillows or mattress with materials less likely to cause allergies.   Talk to your caregiver about an action plan for managing asthma attacks at home, including, the use of a peak flow meter which measures the severity of your asthma attack. An action plan can help minimize or stop the attack   without having to seek medical care.   If you are not on a fluid restriction, drink 8 to 10 glasses of water each day.   Always have a plan prepared for seeking medical attention, including, calling your physician, accessing local emergency care, and calling 911 (in the [place] for a severe attack.   Discuss possible exercise routines with your caregiver.   If animal dander is the cause of asthma, you may need to get rid of pets.  SEEK MEDICAL CARE IF:    You have wheezing and shortness of breath even if taking medicine to prevent attacks.   You have muscle aches, chest pain or thickening of sputum.   Your sputum changes from clear or white to yellow, green, gray, or bloody.   You have any problems that may be related to the medicine you are taking (such as a rash, itching, swelling or trouble breathing).  SEEK IMMEDIATE MEDICAL CARE IF:    Your usual medicines do not stop your wheezing or there is increased coughing and/or shortness of breath.   You have increased difficulty breathing.   You have a fever.  MAKE SURE YOU:    Understand these instructions.   Will watch your condition.   Will get help right away if you are not doing well or get worse.  Document Released: 04/08/2005 Document Revised: 03/28/2011 Document Reviewed: 11/25/2007  [employer] Patient Information 2012 [employer]

## 2011-10-02 NOTE — ED Notes (Signed)
IHK:[id number]<ZD> Expected date:10/02/11<BR> Expected time: 4:27 AM<BR> Means of arrival:Ambulance<BR> Comments:<BR> Asthma

## 2011-10-02 NOTE — ED Notes (Signed)
RT contacted to perform treatment 

## 2011-10-02 NOTE — ED Notes (Signed)
Pt alert, nad, c/o SOB, asthma, onset this evening, resp even unlabored, exp wheezes per EMS, IV est 18 lac, solumedrol 125mg  IVP, Albuterol 10mg Reita May 0.5mg  inh pta

## 2011-10-02 NOTE — ED Provider Notes (Signed)
History     CSN: [id number]  Arrival date & time 10/02/11  0448   First MD Initiated Contact with Patient 10/02/11 0501      Chief Complaint  Patient presents with  . Asthma    (Consider location/radiation/quality/duration/timing/severity/associated sxs/prior treatment) HPI Comments: Patient with a history of asthma presents tonight with acute attack, states tried two nebulizer treatments at home - states just got his steroids filled from 3 days ago.  Reports feeling better after steroids and inhaler given by EMS.  Denies fever, chills, chest pain, cough, hemoptysis.  Patient is a 31 y.o. male presenting with asthma. The history is provided by the patient. No language interpreter was used.  Asthma This is a recurrent problem. The current episode started today. The problem occurs constantly. The problem has been unchanged. Pertinent negatives include no abdominal pain, anorexia, arthralgias, change in bowel habit, chest pain, chills, congestion, coughing, diaphoresis, fatigue, fever, headaches, joint swelling, myalgias, nausea, neck pain, numbness, rash, sore throat, swollen glands, urinary symptoms, vertigo, visual change, vomiting or weakness. Nothing aggravates the symptoms. He has tried nothing for the symptoms. The treatment provided no relief.    Past Medical History  Diagnosis Date  . Asthma   . ADHD (attention deficit hyperactivity disorder)     Past Surgical History  Procedure Date  . Tympanoplasty     No family history on file.  History  Substance Use Topics  . Smoking status: Never Smoker   . Smokeless tobacco: Not on file  . Alcohol Use: No     occasionally      Review of Systems  Constitutional: Negative for fever, chills, diaphoresis and fatigue.  HENT: Negative for congestion, sore throat and neck pain.   Respiratory: Negative for cough.   Cardiovascular: Negative for chest pain.  Gastrointestinal: Negative for nausea, vomiting, abdominal pain, anorexia  and change in bowel habit.  Musculoskeletal: Negative for myalgias, joint swelling and arthralgias.  Skin: Negative for rash.  Neurological: Negative for vertigo, weakness, numbness and headaches.  All other systems reviewed and are negative.    Allergies  Amoxicillin-pot clavulanate and Other  Home Medications   Current Outpatient Rx  Name Route Sig Dispense Refill  . ALBUTEROL SULFATE (2.5 MG/3ML) 0.083% IN NEBU Nebulization Take 2.5 mg by nebulization every 6 (six) hours as needed. Pain.    Marland Kitchen PREDNISONE 20 MG PO TABS Oral Take 3 tablets (60 mg total) by mouth daily. 15 tablet 0  . PROMETHAZINE HCL 25 MG PO TABS Oral Take 1 tablet (25 mg total) by mouth every 6 (six) hours as needed for nausea. 10 tablet 0    BP 102/67  Pulse 74  Temp 97.3 F (36.3 C) (Oral)  Resp 18  Ht 5\' 9"  (1.753 m)  Wt 200 lb (90.719 kg)  BMI 29.53 kg/m2  SpO2 92%  Physical Exam  Nursing note and vitals reviewed. Constitutional: He is oriented to person, place, and time. He appears well-developed and well-nourished. No distress.  HENT:  Head: Normocephalic and atraumatic.  Right Ear: External ear normal.  Left Ear: External ear normal.  Nose: Nose normal.  Mouth/Throat: Oropharynx is clear and moist. No oropharyngeal exudate.  Eyes: Conjunctivae are normal. Pupils are equal, round, and reactive to light. No scleral icterus.  Neck: Normal range of motion. Neck supple.  Cardiovascular: Normal rate, regular rhythm and normal heart sounds.  Exam reveals no gallop and no friction rub.   No murmur heard. Pulmonary/Chest: Effort normal. No respiratory distress. He has  wheezes. He has no rales. He exhibits no tenderness.       Bilateral expiratory wheezing noted at the bases.  Abdominal: Soft. Bowel sounds are normal. He exhibits no distension. There is no tenderness.  Musculoskeletal: Normal range of motion. He exhibits no edema and no tenderness.  Lymphadenopathy:    He has no cervical adenopathy.    Neurological: He is alert and oriented to person, place, and time. No cranial nerve deficit.  Skin: Skin is warm and dry. No rash noted. No erythema. No pallor.  Psychiatric: He has a normal mood and affect. His behavior is normal. Judgment and thought content normal.    ED Course  Procedures (including critical care time)  Labs Reviewed - No data to display No results found.   Asthma Exacerbation   MDM  Patient with a history of asthma presents with acute attack - did not get steroids filled from 3 days ago until today.  Improvement and complete resolution of wheezing after second breathing treatment here.  Given albuterol HFA for home.        Izola Price [place], [place] 10/02/11 [phone]

## 2011-10-03 NOTE — ED Provider Notes (Signed)
Medical screening examination/treatment/procedure(s) were performed by non-physician practitioner and as supervising physician I was immediately available for consultation/collaboration.  Trenisha Lafavor, MD 10/03/11 2347 

## 2011-10-10 ENCOUNTER — Emergency Department (HOSPITAL_COMMUNITY)
Admission: EM | Admit: 2011-10-10 | Discharge: 2011-10-10 | Disposition: A | Discharge status: Home / Self Care | Payer: Self-pay | Attending: Emergency Medicine | Admitting: Emergency Medicine

## 2011-10-10 ENCOUNTER — Encounter (HOSPITAL_COMMUNITY): Payer: Self-pay | Admitting: Emergency Medicine

## 2011-10-10 DIAGNOSIS — J45901 Unspecified asthma with (acute) exacerbation: Secondary | ICD-10-CM | POA: Insufficient documentation

## 2011-10-10 DIAGNOSIS — F909 Attention-deficit hyperactivity disorder, unspecified type: Secondary | ICD-10-CM | POA: Insufficient documentation

## 2011-10-10 DIAGNOSIS — J069 Acute upper respiratory infection, unspecified: Secondary | ICD-10-CM

## 2011-10-10 MED ORDER — ALBUTEROL SULFATE (5 MG/ML) 0.5% IN NEBU
2.5000 mg | INHALATION_SOLUTION | Freq: Once | RESPIRATORY_TRACT | Status: AC
  Administered 2011-10-10: 2.5 mg via RESPIRATORY_TRACT
  Filled 2011-10-10: qty 0.5

## 2011-10-10 MED ORDER — ALBUTEROL SULFATE HFA 108 (90 BASE) MCG/ACT IN AERS
2.0000 | INHALATION_SPRAY | Freq: Four times a day (QID) | RESPIRATORY_TRACT | Status: DC
  Administered 2011-10-10: 2 via RESPIRATORY_TRACT
  Filled 2011-10-10: qty 6.7

## 2011-10-10 MED ORDER — AZITHROMYCIN 250 MG PO TABS: ORAL_TABLET | ORAL | Status: DC

## 2011-10-10 MED ORDER — PREDNISONE 20 MG PO TABS: 40.0000 mg | ORAL_TABLET | Freq: Every day | ORAL | Status: DC

## 2011-10-10 MED ORDER — IPRATROPIUM BROMIDE 0.02 % IN SOLN
RESPIRATORY_TRACT | Status: AC
  Administered 2011-10-10: 10:00:00
  Filled 2011-10-10: qty 2.5

## 2011-10-10 MED ORDER — ALBUTEROL SULFATE (5 MG/ML) 0.5% IN NEBU
INHALATION_SOLUTION | RESPIRATORY_TRACT | Status: AC
  Administered 2011-10-10: 10:00:00
  Filled 2011-10-10: qty 1

## 2011-10-10 MED ORDER — PREDNISONE 20 MG PO TABS
60.0000 mg | ORAL_TABLET | Freq: Once | ORAL | Status: AC
  Administered 2011-10-10: 60 mg via ORAL
  Filled 2011-10-10: qty 3

## 2011-10-10 MED ORDER — IPRATROPIUM BROMIDE 0.02 % IN SOLN
0.5000 mg | Freq: Four times a day (QID) | RESPIRATORY_TRACT | Status: DC
  Administered 2011-10-10: 0.5 mg via RESPIRATORY_TRACT
  Filled 2011-10-10: qty 2.5

## 2011-10-10 NOTE — Discharge Instructions (Signed)
Return for any shortness of breath   RESOURCE GUIDE  Chronic Pain Problems: Contact [facility]  [phone] Patients need to be referred by their primary care doctor.  Insufficient Money for Medicine: Contact United Way:  call "211" or [facility] [phone].  No Primary Care Doctor: - Call [facility]  [phone] - can help you locate a primary care doctor that  accepts your insurance, provides certain services, etc. - Physician Referral Service- [phone]  Agencies that provide inexpensive medical care: - [facility]  [phone] - [facility]  [phone] - [facility]  [phone] - Women's Clinic  [phone] - Planned Parenthood  [phone] [facility]  [phone]  Medicaid-accepting [facility] Providers: - [facility]- [address]  [phone], Mon-Fri 9am-7pm, Sat 9am-1pm - [facility]- [address] [place], [address] [place]  [phone] - [facility]- [address] [place]  [phone] [facility]- [address]  [phone] - Renaye Rakers- [address] [place], [address], [phone]  Only accepts [place] patients after they have their name  applied to their card  Self Pay (no insurance) in [place]: - Sickle Cell Patients: Dr Willey Blade, [facility]  [address] [place], [phone] - [facility]- [address] [place]  [phone]       [facility] [place]- [address]       -     [facility]- see information above (Speak to [employer] if you do not have insurance)       -  [facility]- [address] [place], [phone]       -  [facility]- [address],  [phone]       -  [facility]- [address], [phone]       -  Dr Julio Sicks-  [address], [place],  [phone]       -  [facility]- [address] [phone]       -  [facility]- [address] [phone], also [address], [phone]       -    [facility]- [address] [place], [phone], 1st & 3rd Saturday   every month, 10am-1pm  1) Find a Doctor and Pay Out of Pocket Although you won't have to find out who is covered by your insurance plan, it is a good idea to ask around and get recommendations. You will then need to call the office and see if the doctor you have chosen will accept you as a new patient and what types of options they offer for patients who are self-pay. Some doctors offer discounts or will set up payment plans for their patients who do not have insurance, but you will need to ask so you aren't surprised when you get to your appointment.  2) Contact Your Local Health Department Not all health departments have doctors that can see patients for sick visits, but many do, so it is worth a call to see if yours does. If you don't know where your local health department is, you can check in your phone book. The CDC also has a tool to help you locate your state's  health department, and many state websites also have listings of all of their local health departments.  3) Find a Walk-in Clinic If your illness is not likely to be very severe or complicated, you may want to try a walk in clinic. These are popping up all over the country in pharmacies, drugstores, and shopping centers. They're usually staffed by nurse practitioners or physician assistants that have been trained to treat common illnesses and complaints. They're usually fairly quick and inexpensive. However, if you have serious medical issues or chronic medical problems, these are probably not your best option  STD Testing - [facility] Department of [facility] [place], STD Clinic, [address], [place], phone [phone] or [phone].  Monday - Friday, call for  an appointment. [facility] Department of [employer], STD Clinic, [address], [place], phone [phone] or [phone].  Monday - Friday, call for an appointment.  Abuse/Neglect: [facility] Child Abuse Hotline 979-217-8207 [facility] Child Abuse Hotline 641-473-9405 (After Hours)  Emergency Shelter:  [facility] [phone]  Maternity Homes: - [facility] [place] [facility] [phone] - [facility] [phone]  MRSA Hotline #:   8457814410  [facility] Resources  [facility] [place]  [employer] [facility] Dept. [address]                 [address]         [address] [place] [address]  Blondell Reveal Phone:  [phone]                                  Phone:  [phone]                   Phone:  [phone]  [facility], [phone] - [facility] - [phone]       -     [facility] in [place], [address],                                  [phone], [facility] Child Abuse Hotline 603-087-2818 or 952-197-7983 (After Hours)   Behavioral Health Services  Substance Abuse Resources: - Alcohol and Drug Services  714-134-5384 - [facility] [phone] - [facility] [place] [phone] [place]h [place] [phone] - Residential & Outpatient Substance Abuse Program  ([phone]  Psychological Services: [facility]  [phone] Services  [phone] - [facility], [phone] [place]. [address] [place], ACCESS LINE: [phone] or 8081406986, [email or web address]za  Dental Assistance  If unable to pay or uninsured, contact:  [facility] or [facility]. to become qualified  for the adult dental clinic.  Patients with Medicaid: [facility] [phone] [address], [phone]  [address] [phone]  If unable to pay, or uninsured, contact [facility] [phone]) or [facility] Department [phone] in [place], [phone] in [facility]) to become qualified for the adult dental clinic  Other Low-Cost Community Dental Services: - [facility]- [address] [place], [place], [place], [address], [phone], Ext. [phone], 2nd and 4th Thursday of the month at 6:30am.  10 clients each day by appointment, can sometimes see walk-in patients if someone does not show for an appointment. [facility]- [address] [place], [place], [address], [phone] - [facility]- [address], [place], [place], [address], [phone] - [facility]- [phone] [facility] Department- [phone] [facility] Department- [phone]

## 2011-10-10 NOTE — ED Provider Notes (Signed)
History     CSN: [id number]  Arrival date & time 10/10/11  4098   First MD Initiated Contact with Patient 10/10/11 [phone]      Chief Complaint  Patient presents with  . Asthma    (Consider location/radiation/quality/duration/timing/severity/associated sxs/prior treatment) Patient is a 31 y.o. male presenting with asthma. The history is provided by the patient.  Asthma This is a new problem.   31 y/o male INAD c/o asthma exacerbation x6 hours. Pt has had runny nose and productive cough x1 week. Denies fever, NV, pain. No h/o intubations. Asthma normally well controlled with albuterol and simbicort, but Pt is uninsured and has not been able to get his simbicort.   Past Medical History  Diagnosis Date  . Asthma   . ADHD (attention deficit hyperactivity disorder)     Past Surgical History  Procedure Date  . Tympanoplasty     No family history on file.  History  Substance Use Topics  . Smoking status: Never Smoker   . Smokeless tobacco: Not on file  . Alcohol Use: No     occasionally      Review of Systems  All other systems reviewed and are negative.    Allergies  Amoxicillin-pot clavulanate and Other  Home Medications   Current Outpatient Rx  Name Route Sig Dispense Refill  . ALBUTEROL SULFATE (2.5 MG/3ML) 0.083% IN NEBU Nebulization Take 2.5 mg by nebulization every 6 (six) hours as needed. Pain.    Marland Kitchen PREDNISONE 20 MG PO TABS Oral Take 3 tablets (60 mg total) by mouth daily. 15 tablet 0  . PROMETHAZINE HCL 25 MG PO TABS Oral Take 1 tablet (25 mg total) by mouth every 6 (six) hours as needed for nausea. 10 tablet 0    BP 117/63  Pulse 79  Temp 97.6 F (36.4 C) (Oral)  Resp 19  SpO2 95%  Physical Exam  Vitals reviewed. Constitutional: He is oriented to person, place, and time. He appears well-developed and well-nourished.       Speaking in full sentences, no tripoding. Pt reclining copnmfortably  HENT:  Head: Normocephalic.  Eyes: Conjunctivae and  EOM are normal. Pupils are equal, round, and reactive to light.  Cardiovascular: Normal rate.   Pulmonary/Chest: Effort normal.       Mild end expiratory wheezing. Expiratory phase longer than inspiratory phase. No accessory muscle use  Musculoskeletal: Normal range of motion.  Neurological: He is alert and oriented to person, place, and time.  Psychiatric: He has a normal mood and affect.    ED Course  Procedures (including critical care time)  Labs Reviewed - No data to display No results found.   No diagnosis found.    MDM  31 y/o male with asthma exacerbation. Pt has had runny nose and productive cough x1 week. Denies fever. Pt reclines comfortably, speaks in full sentences and has no increased labor of breathing or accessory muscle use. Mild end expiratory wheezing. Clears completely after prednisone 30mg  PO and duo neb treatment.    Pt verbalized understanding and agrees with care plan. Outpatient follow-up and return precautions given.           Wynetta Emery, PA-C 10/10/11 1107

## 2011-10-10 NOTE — ED Notes (Signed)
Per EMS- pt c/o wheezing,SOB, and a productive cough.  Pt has a hx of asthma.  Was given albuterol and Atrovent by EMS.  Had one neb treatment at home prior to EMS arrival.

## 2011-10-10 NOTE — ED Notes (Signed)
Pt c/o SOB, productive cough, and difficulty breathing x1 day.  Wheezing heard on auscultation.  Pt has a hx of asthma.

## 2011-10-10 NOTE — ED Notes (Signed)
Respiratory notified regarding pt medication order.

## 2011-10-10 NOTE — ED Notes (Signed)
[id number]<AV> Expected date:10/10/11<BR> Expected time: 9:19 AM<BR> Means of arrival:Ambulance<BR> Comments:<BR> 30yoM,SOB, hx of asthma

## 2011-10-10 NOTE — ED Provider Notes (Signed)
Medical screening examination/treatment/procedure(s) were conducted as a shared visit with non-physician practitioner(s) and myself.  I personally evaluated the patient during the encounter  Pt with mild asthma exacerbation.  Improved here on exam with minimal end expiratory wheezing.  Pt ok for d/c home with prednisone and albuterol.    Nat Christen, MD 10/10/11 [phone]

## 2011-10-10 NOTE — ED Notes (Signed)
[id number]<GN> Expected date:10/10/11<BR> Expected time: 9:14 AM<BR> Means of arrival:Ambulance<BR> Comments:<BR> 63yoF,syncopal episode

## 2011-10-13 ENCOUNTER — Emergency Department (HOSPITAL_COMMUNITY): Payer: Self-pay

## 2011-10-13 ENCOUNTER — Emergency Department (HOSPITAL_COMMUNITY)
Admission: EM | Admit: 2011-10-13 | Discharge: 2011-10-13 | Disposition: A | Discharge status: Home / Self Care | Payer: Self-pay | Attending: Emergency Medicine | Admitting: Emergency Medicine

## 2011-10-13 ENCOUNTER — Encounter (HOSPITAL_COMMUNITY): Payer: Self-pay | Admitting: *Deleted

## 2011-10-13 ENCOUNTER — Inpatient Hospital Stay (HOSPITAL_COMMUNITY)
Admission: EM | Admit: 2011-10-13 | Discharge: 2011-10-17 | DRG: 203 | Disposition: A | Discharge status: Home / Self Care | Payer: MEDICAID | Attending: Internal Medicine | Admitting: Internal Medicine

## 2011-10-13 DIAGNOSIS — J45901 Unspecified asthma with (acute) exacerbation: Secondary | ICD-10-CM

## 2011-10-13 DIAGNOSIS — F909 Attention-deficit hyperactivity disorder, unspecified type: Secondary | ICD-10-CM | POA: Diagnosis present

## 2011-10-13 DIAGNOSIS — D72829 Elevated white blood cell count, unspecified: Secondary | ICD-10-CM | POA: Diagnosis present

## 2011-10-13 DIAGNOSIS — J45902 Unspecified asthma with status asthmaticus: Secondary | ICD-10-CM

## 2011-10-13 DIAGNOSIS — J45909 Unspecified asthma, uncomplicated: Secondary | ICD-10-CM | POA: Insufficient documentation

## 2011-10-13 DIAGNOSIS — Z79899 Other long term (current) drug therapy: Secondary | ICD-10-CM | POA: Insufficient documentation

## 2011-10-13 MED ORDER — IPRATROPIUM BROMIDE 0.02 % IN SOLN
0.5000 mg | Freq: Once | RESPIRATORY_TRACT | Status: AC
  Administered 2011-10-13: 0.5 mg via RESPIRATORY_TRACT

## 2011-10-13 MED ORDER — ALBUTEROL (5 MG/ML) CONTINUOUS INHALATION SOLN
15.0000 mg/h | INHALATION_SOLUTION | Freq: Once | RESPIRATORY_TRACT | Status: AC
  Administered 2011-10-13: 15 mg/h via RESPIRATORY_TRACT

## 2011-10-13 MED ORDER — ALBUTEROL SULFATE (5 MG/ML) 0.5% IN NEBU
5.0000 mg | INHALATION_SOLUTION | Freq: Once | RESPIRATORY_TRACT | Status: AC
  Administered 2011-10-13: 5 mg via RESPIRATORY_TRACT

## 2011-10-13 MED ORDER — ALBUTEROL SULFATE (5 MG/ML) 0.5% IN NEBU
2.5000 mg | INHALATION_SOLUTION | Freq: Once | RESPIRATORY_TRACT | Status: AC
  Administered 2011-10-13: 5 mg via RESPIRATORY_TRACT

## 2011-10-13 MED ORDER — MAGNESIUM SULFATE 40 MG/ML IJ SOLN
2.0000 g | Freq: Two times a day (BID) | INTRAMUSCULAR | Status: DC
  Administered 2011-10-13: 2 g via INTRAVENOUS
  Filled 2011-10-13 (×2): qty 50

## 2011-10-13 MED ORDER — ONDANSETRON HCL 4 MG/2ML IJ SOLN: 4.0000 mg | Freq: Four times a day (QID) | INTRAMUSCULAR | Status: DC | PRN

## 2011-10-13 MED ORDER — ALBUTEROL SULFATE (5 MG/ML) 0.5% IN NEBU
2.5000 mg | INHALATION_SOLUTION | Freq: Once | RESPIRATORY_TRACT | Status: AC
  Administered 2011-10-13: 2.5 mg via RESPIRATORY_TRACT
  Filled 2011-10-13: qty 1

## 2011-10-13 MED ORDER — ALBUTEROL (5 MG/ML) CONTINUOUS INHALATION SOLN
10.0000 mg/h | INHALATION_SOLUTION | Freq: Once | RESPIRATORY_TRACT | Status: AC
  Administered 2011-10-13: 10 mg/h via RESPIRATORY_TRACT

## 2011-10-13 MED ORDER — ALBUTEROL SULFATE (5 MG/ML) 0.5% IN NEBU
5.0000 mg | INHALATION_SOLUTION | Freq: Once | RESPIRATORY_TRACT | Status: DC
  Filled 2011-10-13: qty 1

## 2011-10-13 MED ORDER — ACETAMINOPHEN 325 MG PO TABS
650.0000 mg | ORAL_TABLET | ORAL | Status: DC | PRN
  Administered 2011-10-15: 650 mg via ORAL
  Filled 2011-10-13: qty 2

## 2011-10-13 MED ORDER — ALBUTEROL (5 MG/ML) CONTINUOUS INHALATION SOLN
INHALATION_SOLUTION | RESPIRATORY_TRACT | Status: AC
  Administered 2011-10-13: 15 mg/h via RESPIRATORY_TRACT
  Filled 2011-10-13: qty 20

## 2011-10-13 MED ORDER — ALBUTEROL SULFATE (5 MG/ML) 0.5% IN NEBU
INHALATION_SOLUTION | RESPIRATORY_TRACT | Status: AC
  Filled 2011-10-13: qty 1

## 2011-10-13 MED ORDER — AZITHROMYCIN 250 MG PO TABS
500.0000 mg | ORAL_TABLET | Freq: Once | ORAL | Status: AC
  Administered 2011-10-13: 500 mg via ORAL
  Filled 2011-10-13: qty 2

## 2011-10-13 MED ORDER — IPRATROPIUM BROMIDE 0.02 % IN SOLN
RESPIRATORY_TRACT | Status: AC
  Administered 2011-10-13: 0.5 mg via RESPIRATORY_TRACT
  Filled 2011-10-13: qty 2.5

## 2011-10-13 MED ORDER — BUDESONIDE-FORMOTEROL FUMARATE 160-4.5 MCG/ACT IN AERO
2.0000 | INHALATION_SPRAY | Freq: Two times a day (BID) | RESPIRATORY_TRACT | Status: DC
  Administered 2011-10-13 – 2011-10-17 (×8): 2 via RESPIRATORY_TRACT
  Filled 2011-10-13: qty 6

## 2011-10-13 MED ORDER — ALBUTEROL SULFATE (5 MG/ML) 0.5% IN NEBU
INHALATION_SOLUTION | RESPIRATORY_TRACT | Status: AC
  Administered 2011-10-13: 16:00:00
  Filled 2011-10-13: qty 2

## 2011-10-13 MED ORDER — METHYLPREDNISOLONE SODIUM SUCC 125 MG IJ SOLR
INTRAMUSCULAR | Status: AC
  Administered 2011-10-13: 16:00:00
  Filled 2011-10-13: qty 2

## 2011-10-13 MED ORDER — IPRATROPIUM BROMIDE 0.02 % IN SOLN
RESPIRATORY_TRACT | Status: AC
  Filled 2011-10-13: qty 2.5

## 2011-10-13 MED ORDER — SODIUM CHLORIDE 0.9 % IV SOLN
1000.0000 mL | Freq: Once | INTRAVENOUS | Status: AC
  Administered 2011-10-13: 1000 mL via INTRAVENOUS

## 2011-10-13 MED ORDER — IPRATROPIUM BROMIDE 0.02 % IN SOLN
0.5000 mg | Freq: Once | RESPIRATORY_TRACT | Status: DC
  Filled 2011-10-13: qty 2.5

## 2011-10-13 MED ORDER — ALBUTEROL SULFATE (5 MG/ML) 0.5% IN NEBU
INHALATION_SOLUTION | RESPIRATORY_TRACT | Status: AC
  Administered 2011-10-13: 5 mg via RESPIRATORY_TRACT
  Filled 2011-10-13: qty 1

## 2011-10-13 MED ORDER — ALBUTEROL SULFATE HFA 108 (90 BASE) MCG/ACT IN AERS
2.0000 | INHALATION_SPRAY | RESPIRATORY_TRACT | Status: DC | PRN
  Filled 2011-10-13: qty 6.7

## 2011-10-13 NOTE — ED Notes (Signed)
Albuterol HFA given to pt to take home per PA tiffany

## 2011-10-13 NOTE — ED Provider Notes (Signed)
History     CSN: [id number]  Arrival date & time 10/13/11  1539   First MD Initiated Contact with Patient 10/13/11 1541      Chief Complaint  Patient presents with  . Asthma  . Shortness of Breath    (Consider location/radiation/quality/duration/timing/severity/associated sxs/prior treatment) HPI  Past Medical History  Diagnosis Date  . Asthma   . ADHD (attention deficit hyperactivity disorder)     Past Surgical History  Procedure Date  . Tympanoplasty     Family History  Problem Relation Age of Onset  . Asthma Mother   . Heart failure Mother   . Hyperlipidemia Mother   . Hypertension Mother   . Migraines Mother   . Coronary artery disease Father   . Heart attack Father     History  Substance Use Topics  . Smoking status: Never Smoker   . Smokeless tobacco: Not on file  . Alcohol Use: No     occasionally      Review of Systems  Allergies  Amoxicillin-pot clavulanate and Other  Home Medications   Current Outpatient Rx  Name Route Sig Dispense Refill  . ACETAMINOPHEN 500 MG PO TABS Oral Take 1,000 mg by mouth every 6 (six) hours as needed. For pain    . ALBUTEROL SULFATE (2.5 MG/3ML) 0.083% IN NEBU Nebulization Take 2.5 mg by nebulization every 6 (six) hours as needed. For shortness of breath.    . AZITHROMYCIN 250 MG PO TABS Oral Take 250 mg by mouth as directed. 2 tabs by mouth day 1, 1 tab daily days 2-5    . PREDNISONE 20 MG PO TABS Oral Take 2 tablets (40 mg total) by mouth daily. 10 tablet 0    BP 108/48  Pulse 105  Temp 97.6 F (36.4 C) (Axillary)  Resp 24  SpO2 96%  Physical Exam  ED Course  Procedures (including critical care time)  Labs Reviewed - No data to display Dg Chest 2 View  10/13/2011  *RADIOLOGY REPORT*  Clinical Data: Asthma exacerbation.  Wheezing.  CHEST - 2 VIEW  Comparison: 09/09/2011.  Findings: The cardiac silhouette, mediastinal and hilar contours are normal and stable.  The lungs are clear.  Mild hyperinflation.  Minimal peribronchial thickening.  No effusion.  The bony thorax is intact.  IMPRESSION: Mild hyperinflation and peribronchial thickening but no infiltrates.  Original Report Authenticated By: P. Loralie Champagne, M.D.     1. Asthma exacerbation    5:00 PM Patient from Pod A, handoff from [facility], patient with multiple frequent asthma exacerbations over the past month with ED visits -- seen at [facility] this AM and treated in hour long neb, improved per note and was discharged. Patient had worsening breathing this afternoon, O2 sat in 80's, transported to ED by EMS.   Vital signs reviewed and are as follows: Filed Vitals:   10/13/11 1645  BP: 108/48  Pulse: 105  Temp:   Resp: 24  BP 108/48  Pulse 105  Temp 97.6 F (36.4 C) (Axillary)  Resp 24  SpO2 96%  5:02 PM Exam: Gen NAD; Heart mild tachycardia, nml S1,S2, no m/r/g; Lungs mild diffuse expiratory wheezing bilaterally; Abd soft, NT, no rebound or guarding; Ext 2+ pedal pulses bilaterally, no edema.  Patient is placed on asthma protocol.   11:32 PM Peak from from 250 to 265. Intermittent improvement. Will continue nebs overnight. Handoff to Dr. Nicanor Alcon.   Plan is to monitor to ensure stability of symptoms overnight. He can be discharged when  improved and his symptoms are stable. Consider admit if no or little improvement.   MDM  Asthma protocol.         [place], [place] 10/13/11 2333

## 2011-10-13 NOTE — ED Provider Notes (Signed)
Medical screening examination/treatment/procedure(s) were conducted as a shared visit with non-physician practitioner(s) and myself.  I personally evaluated the patient during the encounter   Joya Gaskins, MD 10/13/11 2052

## 2011-10-13 NOTE — ED Notes (Signed)
RT at bedside.

## 2011-10-13 NOTE — ED Notes (Signed)
Meal tray ordered 

## 2011-10-13 NOTE — ED Notes (Signed)
Respiratory therapist called for neb treatment.  

## 2011-10-13 NOTE — ED Provider Notes (Signed)
History     CSN: [id number]  Arrival date & time 10/13/11  1539   First MD Initiated Contact with Patient 10/13/11 1541      Chief Complaint  Patient presents with  . Asthma  . Shortness of Breath    (Consider location/radiation/quality/duration/timing/severity/associated sxs/prior treatment) HPI  Patient who has been seen in the emergency department numerous times over the last month for asthma exacerbation and was most recently seen at the [facility] emergency department this morning with complaint of asthma exacerbation returns to emergency department by EMS with complaints of return of asthma attack. Per EMS patient's pulse ox was initially 80% on room air but after he was given Combivent x1 and albuterol x1 with IV Solu-Medrol his sats improved to greater than 95% on room air. Patient states that he has a nebulized albuterol machine however he has been having difficulty getting his medications filled due to financial difficulties. Patient is in the process of having it Medicaid started however does not currently have Medicaid. Patient denies fevers or chills. He denies chest pain or hemoptysis. He has no other complaint. Patient states that other than his asthma he has no other known medical problems. Patient states symptoms are similar to his recurrent asthma attacks. He denies aggravating or alleviating factors. He denies tobacco abuse. Patient states his Re: feeling improved and EMS started treatments and gave her IV Solu-Medrol. Patient has been on at least one long steroid taper this month.  Past Medical History  Diagnosis Date  . Asthma   . ADHD (attention deficit hyperactivity disorder)     Past Surgical History  Procedure Date  . Tympanoplasty     History reviewed. No pertinent family history.  History  Substance Use Topics  . Smoking status: Never Smoker   . Smokeless tobacco: Not on file  . Alcohol Use: No     occasionally      Review of Systems  All other  systems reviewed and are negative.    Allergies  Amoxicillin-pot clavulanate and Other  Home Medications   Current Outpatient Rx  Name Route Sig Dispense Refill  . ACETAMINOPHEN 500 MG PO TABS Oral Take 1,000 mg by mouth every 6 (six) hours as needed. For pain    . ALBUTEROL SULFATE (2.5 MG/3ML) 0.083% IN NEBU Nebulization Take 2.5 mg by nebulization every 6 (six) hours as needed. For shortness of breath.    . AZITHROMYCIN 250 MG PO TABS Oral Take 250 mg by mouth as directed. 2 tabs by mouth day 1, 1 tab daily days 2-5    . PREDNISONE 20 MG PO TABS Oral Take 2 tablets (40 mg total) by mouth daily. 10 tablet 0    BP 133/87  Pulse 93  Temp 97.6 F (36.4 C) (Axillary)  Resp 19  SpO2 99%  Physical Exam  Nursing note and vitals reviewed. Constitutional: He is oriented to person, place, and time. He appears well-developed and well-nourished. No distress.  HENT:  Head: Normocephalic and atraumatic.  Eyes: Conjunctivae and EOM are normal. Pupils are equal, round, and reactive to light.  Neck: Normal range of motion. Neck supple.  Cardiovascular: Regular rhythm, normal heart sounds and intact distal pulses.  Tachycardia present.  Exam reveals no gallop and no friction rub.   No murmur heard. Pulmonary/Chest: Breath sounds normal. Tachypnea noted. No respiratory distress. He has no wheezes. He has no rales. He exhibits no tenderness.       Faint expiratory wheezing but good lung sounds and  movement throughout lung fields.   Abdominal: Bowel sounds are normal. He exhibits no distension and no mass. There is no tenderness. There is no rebound and no guarding.  Musculoskeletal: Normal range of motion. He exhibits no edema and no tenderness.  Neurological: He is alert and oriented to person, place, and time.  Skin: Skin is warm and dry. No rash noted. He is not diaphoretic. No erythema.  Psychiatric: He has a normal mood and affect.    ED Course  Procedures (including critical care  time)  Wheezing protocol initiated. Discussed case with Dr. Bebe Shaggy and will start patient on CDU asthma protocol and attempt to contact social work to see if there are any more resources we can initiate to help prevent frequent ER visits for asthma exacerbations.   Labs Reviewed - No data to display Dg Chest 2 View  10/13/2011  *RADIOLOGY REPORT*  Clinical Data: Asthma exacerbation.  Wheezing.  CHEST - 2 VIEW  Comparison: 09/09/2011.  Findings: The cardiac silhouette, mediastinal and hilar contours are normal and stable.  The lungs are clear.  Mild hyperinflation. Minimal peribronchial thickening.  No effusion.  The bony thorax is intact.  IMPRESSION: Mild hyperinflation and peribronchial thickening but no infiltrates.  Original Report Authenticated By: P. Loralie Champagne, M.D.     1. Asthma exacerbation    IV fluids, IV magnesium.    MDM  signout given to [employer]. Patient evaluated by Dr. Bebe Shaggy who is agreeable to CDU asthma protocol. NAD, no respiratory distress. improving symptoms with nebs and IV fluids.         [place], [place] 10/13/11 1636

## 2011-10-13 NOTE — ED Notes (Signed)
Pt has received meal tray.

## 2011-10-13 NOTE — Discharge Instructions (Signed)
Asthma, Adult Asthma is caused by narrowing of the air passages in the lungs. It may be triggered by pollen, dust, animal dander, molds, some foods, respiratory infections, exposure to smoke, exercise, emotional stress or other allergens (things that cause allergic reactions or allergies). Repeat attacks are common. HOME CARE INSTRUCTIONS   Use prescription medications as ordered by your caregiver.   Avoid pollen, dust, animal dander, molds, smoke and other things that cause attacks at home and at work.   You may have fewer attacks if you decrease dust in your home. Electrostatic air cleaners may help.   It may help to replace your pillows or mattress with materials less likely to cause allergies.   Talk to your caregiver about an action plan for managing asthma attacks at home, including, the use of a peak flow meter which measures the severity of your asthma attack. An action plan can help minimize or stop the attack without having to seek medical care.   If you are not on a fluid restriction, drink 8 to 10 glasses of water each day.   Always have a plan prepared for seeking medical attention, including, calling your physician, accessing local emergency care, and calling 911 (in the [place] for a severe attack.   Discuss possible exercise routines with your caregiver.   If animal dander is the cause of asthma, you may need to get rid of pets.  SEEK MEDICAL CARE IF:   You have wheezing and shortness of breath even if taking medicine to prevent attacks.   You have muscle aches, chest pain or thickening of sputum.   Your sputum changes from clear or white to yellow, green, gray, or bloody.   You have any problems that may be related to the medicine you are taking (such as a rash, itching, swelling or trouble breathing).  SEEK IMMEDIATE MEDICAL CARE IF:   Your usual medicines do not stop your wheezing or there is increased coughing and/or shortness of breath.   You have increased  difficulty breathing.   You have a fever.  MAKE SURE YOU:   Understand these instructions.   Will watch your condition.   Will get help right away if you are not doing well or get worse.  Document Released: 04/08/2005 Document Revised: 03/28/2011 Document Reviewed: 11/25/2007 ExitCare Patient Information 2012 [employer].         Asthma Attack Prevention HOW CAN ASTHMA BE PREVENTED? Currently, there is no way to prevent asthma from starting. However, you can take steps to control the disease and prevent its symptoms after you have been diagnosed. Learn about your asthma and how to control it. Take an active role to control your asthma by working with your caregiver to create and follow an asthma action plan. An asthma action plan guides you in taking your medicines properly, avoiding factors that make your asthma worse, tracking your level of asthma control, responding to worsening asthma, and seeking emergency care when needed. To track your asthma, keep records of your symptoms, check your peak flow number using a peak flow meter (handheld device that shows how well air moves out of your lungs), and get regular asthma checkups.  Other ways to prevent asthma attacks include:  Use medicines as your caregiver directs.   Identify and avoid things that make your asthma worse (as much as you can).   Keep track of your asthma symptoms and level of control.   Get regular checkups for your asthma.   With your caregiver,   write a detailed plan for taking medicines and managing an asthma attack. Then be sure to follow your action plan. Asthma is an ongoing condition that needs regular monitoring and treatment.   Identify and avoid asthma triggers. A number of outdoor allergens and irritants (pollen, mold, cold air, air pollution) can trigger asthma attacks. Find out what causes or makes your asthma worse, and take steps to avoid those triggers (see below).   Monitor your breathing.  Learn to recognize warning signs of an attack, such as slight coughing, wheezing or shortness of breath. However, your lung function may already decrease before you notice any signs or symptoms, so regularly measure and record your peak airflow with a home peak flow meter.   Identify and treat attacks early. If you act quickly, you're less likely to have a severe attack. You will also need less medicine to control your symptoms. When your peak flow measurements decrease and alert you to an upcoming attack, take your medicine as instructed, and immediately stop any activity that may have triggered the attack. If your symptoms do not improve, get medical help.   Pay attention to increasing quick-relief inhaler use. If you find yourself relying on your quick-relief inhaler (such as albuterol), your asthma is not under control. See your caregiver about adjusting your treatment.  IDENTIFY AND CONTROL FACTORS THAT MAKE YOUR ASTHMA WORSE A number of common things can set off or make your asthma symptoms worse (asthma triggers). Keep track of your asthma symptoms for several weeks, detailing all the environmental and emotional factors that are linked with your asthma. When you have an asthma attack, go back to your asthma diary to see which factor, or combination of factors, might have contributed to it. Once you know what these factors are, you can take steps to control many of them.  Allergies: If you have allergies and asthma, it is important to take asthma prevention steps at home. Asthma attacks (worsening of asthma symptoms) can be triggered by allergies, which can cause temporary increased inflammation of your airways. Minimizing contact with the substance to which you are allergic will help prevent an asthma attack. Animal Dander:   Some people are allergic to the flakes of skin or dried saliva from animals with fur or feathers. Keep these pets out of your home.   If you can't keep a pet outdoors, keep  the pet out of your bedroom and other sleeping areas at all times, and keep the door closed.   Remove carpets and furniture covered with cloth from your home. If that is not possible, keep the pet away from fabric-covered furniture and carpets.  Dust Mites:  Many people with asthma are allergic to dust mites. Dust mites are tiny bugs that are found in every home, in mattresses, pillows, carpets, fabric-covered furniture, bedcovers, clothes, stuffed toys, fabric, and other fabric-covered items.   Cover your mattress in a special dust-proof cover.   Cover your pillow in a special dust-proof cover, or wash the pillow each week in hot water. Water must be hotter than 130 F to kill dust mites. Cold or warm water used with detergent and bleach can also be effective.   Wash the sheets and blankets on your bed each week in hot water.   Try not to sleep or lie on cloth-covered cushions.   Call ahead when traveling and ask for a smoke-free hotel room. Bring your own bedding and pillows, in case the hotel only supplies feather pillows and down comforters,   which may contain dust mites and cause asthma symptoms.   Remove carpets from your bedroom and those laid on concrete, if you can.   Keep stuffed toys out of the bed, or wash the toys weekly in hot water or cooler water with detergent and bleach.  Cockroaches:  Many people with asthma are allergic to the droppings and remains of cockroaches.   Keep food and garbage in closed containers. Never leave food out.   Use poison baits, traps, powders, gels, or paste (for example, boric acid).   If a spray is used to kill cockroaches, stay out of the room until the odor goes away.  Indoor Mold:  Fix leaky faucets, pipes, or other sources of water that have mold around them.   Clean moldy surfaces with a cleaner that has bleach in it.  Pollen and Outdoor Mold:  When pollen or mold spore counts are high, try to keep your windows closed.   Stay  indoors with windows closed from late morning to afternoon, if you can. Pollen and some mold spore counts are highest at that time.   Ask your caregiver whether you need to take or increase anti-inflammatory medicine before your allergy season starts.  Irritants:   Tobacco smoke is an irritant. If you smoke, ask your caregiver how you can quit. Ask family members to quit smoking, too. Do not allow smoking in your home or car.   If possible, do not use a wood-burning stove, kerosene heater, or fireplace. Minimize exposure to all sources of smoke, including incense, candles, fires, and fireworks.   Try to stay away from strong odors and sprays, such as perfume, talcum powder, hair spray, and paints.   Decrease humidity in your home and use an indoor air cleaning device. Reduce indoor humidity to below 60 percent. Dehumidifiers or central air conditioners can do this.   Try to have someone else vacuum for you once or twice a week, if you can. Stay out of rooms while they are being vacuumed and for a short while afterward.   If you vacuum, use a dust mask from a hardware store, a double-layered or microfilter vacuum cleaner bag, or a vacuum cleaner with a HEPA filter.   Sulfites in foods and beverages can be irritants. Do not drink beer or wine, or eat dried fruit, processed potatoes, or shrimp if they cause asthma symptoms.   Cold air can trigger an asthma attack. Cover your nose and mouth with a scarf on cold or windy days.   Several health conditions can make asthma more difficult to manage, including runny nose, sinus infections, reflux disease, psychological stress, and sleep apnea. Your caregiver will treat these conditions, as well.   Avoid close contact with people who have a cold or the flu, since your asthma symptoms may get worse if you catch the infection from them. Wash your hands thoroughly after touching items that may have been handled by people with a respiratory infection.    Get a flu shot every year to protect against the flu virus, which often makes asthma worse for days or weeks. Also get a pneumonia shot once every five to 10 years.  Drugs:  Aspirin and other painkillers can cause asthma attacks. 10% to 20% of people with asthma have sensitivity to aspirin or a group of painkillers called non-steroidal anti-inflammatory drugs (NSAIDS), such as ibuprofen and naproxen. These drugs are used to treat pain and reduce fevers. Asthma attacks caused by any of these medicines can   be severe and even fatal. These drugs must be avoided in people who have known aspirin sensitive asthma. Products with acetaminophen are considered safe for people who have asthma. It is important that people with aspirin sensitivity read labels of all over-the-counter drugs used to treat pain, colds, coughs, and fever.   Beta blockers and ACE inhibitors are other drugs which you should discuss with your caregiver, in relation to your asthma.  ALLERGY SKIN TESTING  Ask your asthma caregiver about allergy skin testing or blood testing (RAST test) to identify the allergens to which you are sensitive. If you are found to have allergies, allergy shots (immunotherapy) for asthma may help prevent future allergies and asthma. With allergy shots, small doses of allergens (substances to which you are allergic) are injected under your skin on a regular schedule. Over a period of time, your body may become used to the allergen and less responsive with asthma symptoms. You can also take measures to minimize your exposure to those allergens. EXERCISE  If you have exercise-induced asthma, or are planning vigorous exercise, or exercise in cold, humid, or dry environments, prevent exercise-induced asthma by following your caregiver's advice regarding asthma treatment before exercising. Document Released: 03/27/2009 Document Revised: 03/28/2011 Document Reviewed: 03/27/2009 Exit[employer] Patient Information 2012  [employer]. 

## 2011-10-13 NOTE — ED Notes (Signed)
Pt arrived by [facility]. Has hx of asthma, has been out of pulmicort inhaler, increase in sob today, no relief with alb inhaler. Was seen at [facility] today and dc home. spo2 initially 80% for ems, given combivent, alb neb tx and solumedrol 125mg  ivp pta.

## 2011-10-13 NOTE — ED Notes (Signed)
C/o asthma exacerbation, used his neb and didn't work. Pt wheezing bilaterally, sat 100% 2 L [place], 92% RA

## 2011-10-13 NOTE — ED Provider Notes (Signed)
History     CSN: [id number]  Arrival date & time 10/13/11  1056   First MD Initiated Contact with Patient 10/13/11 1109      Chief Complaint  Patient presents with  . Asthma    (Consider location/radiation/quality/duration/timing/severity/associated sxs/prior treatment) HPI Patient presents to the emergency department with complaints of asthma exacerbation. The patient has been here many times this year for the same. Case management has been consulted and he is file for Medicaid which has not come through just yet. He was prescribed azithromycin at his last visit 3 days ago and has not yet filled this prescription. He states that this exacerbation is like all of the others. He states that he uses his due to rock inhaler until it is empty and cannot afford to buy another one. He has also been using his nebulizer machine at home which he states has not been working. The patient was put on prednisone. Patient is able to speak in full sentences, he is in no acute distress. His temperature is 97.5 respirations 22 pulse ox on room air is 95 and 100% with 2 L on nasal cannula. The patient states "they usually give me a few breathing treatments and then let me go". He advises that he will get his azithromycin on Tuesday after he's been paid.   Past Medical History  Diagnosis Date  . Asthma   . ADHD (attention deficit hyperactivity disorder)     Past Surgical History  Procedure Date  . Tympanoplasty     No family history on file.  History  Substance Use Topics  . Smoking status: Never Smoker   . Smokeless tobacco: Not on file  . Alcohol Use: No     occasionally      Review of Systems   HEENT: denies blurry vision or change in hearing PULMONARY: Denies any chest pain or pain with breathing CARDIAC: denies chest pain or heart palpitations MUSCULOSKELETAL:  denies being unable to ambulate ABDOMEN AL: denies abdominal pain GU: denies loss of bowel or urinary control NEURO: denies  numbness and tingling in extremities SKIN: no new rashes PSYCH: patient behavior is normal NECK: Not complaining of neck pain     Allergies  Amoxicillin-pot clavulanate and Other  Home Medications   Current Outpatient Rx  Name Route Sig Dispense Refill  . ACETAMINOPHEN 500 MG PO TABS Oral Take 1,000 mg by mouth every 6 (six) hours as needed. For pain    . ALBUTEROL SULFATE (2.5 MG/3ML) 0.083% IN NEBU Nebulization Take 2.5 mg by nebulization every 6 (six) hours as needed. For shortness of breath.    . AZITHROMYCIN 250 MG PO TABS  2 tabs PO day 1, 1 tab daily days 2-5 6 each 0  . PREDNISONE 20 MG PO TABS Oral Take 2 tablets (40 mg total) by mouth daily. 10 tablet 0    BP 109/77  Pulse 101  Temp 97.5 F (36.4 C) (Oral)  Resp 22  SpO2 96%  Physical Exam  Nursing note and vitals reviewed. Constitutional: He appears well-developed and well-nourished. No distress.  HENT:  Head: Normocephalic and atraumatic.  Eyes: Pupils are equal, round, and reactive to light.  Neck: Normal range of motion. Neck supple.  Cardiovascular: Normal rate and regular rhythm.   Pulmonary/Chest: Effort normal. He has wheezes (expiratory wheezing).  Abdominal: Soft.  Neurological: He is alert.  Skin: Skin is warm and dry.    ED Course  Procedures (including critical care time)  Labs Reviewed - No  data to display Dg Chest 2 View  10/13/2011  *RADIOLOGY REPORT*  Clinical Data: Asthma exacerbation.  Wheezing.  CHEST - 2 VIEW  Comparison: 09/09/2011.  Findings: The cardiac silhouette, mediastinal and hilar contours are normal and stable.  The lungs are clear.  Mild hyperinflation. Minimal peribronchial thickening.  No effusion.  The bony thorax is intact.  IMPRESSION: Mild hyperinflation and peribronchial thickening but no infiltrates.  Original Report Authenticated By: P. Loralie Champagne, M.D.     1. Asthma exacerbation       MDM  Chest xray normal. Pt given 1 hr neb in ED and feels much better.  Once his medicaid pulls through he will not continue to visit the ER multiple times a week. Pt given first dose of Azithromycin in ED and referred back to his PCP Dr. Alwyn Ren.  Pt has been advised of the symptoms that warrant their return to the ED. Patient has voiced understanding and has agreed to follow-up with the PCP or specialist.         Dorthula Matas, PA 10/13/11 1215

## 2011-10-13 NOTE — ED Notes (Signed)
RT aware pt in dept

## 2011-10-13 NOTE — ED Provider Notes (Signed)
Medical screening examination/treatment/procedure(s) were performed by non-physician practitioner and as supervising physician I was immediately available for consultation/collaboration.  Cathlin Buchan R. Florencio Hollibaugh, MD 10/13/11 1414 

## 2011-10-13 NOTE — ED Provider Notes (Signed)
Pt seen with PA [facility] for recurrent asthma, similar to prior episodes He has diffuse bilateral wheezing on lung exam He is stable at this time Plan to OBS overnight  Family history - positive for asthma Social history - denies smoking  No h/o intubations BP 133/87  Pulse 93  Temp 97.6 F (36.4 C) (Axillary)  Resp 19  SpO2 99%   Joya Gaskins, MD 10/13/11 1610

## 2011-10-14 DIAGNOSIS — J45902 Unspecified asthma with status asthmaticus: Secondary | ICD-10-CM

## 2011-10-14 DIAGNOSIS — F988 Other specified behavioral and emotional disorders with onset usually occurring in childhood and adolescence: Secondary | ICD-10-CM

## 2011-10-14 DIAGNOSIS — F909 Attention-deficit hyperactivity disorder, unspecified type: Secondary | ICD-10-CM | POA: Diagnosis present

## 2011-10-14 LAB — CBC
HCT: 39.8 % (ref 39.0–52.0)
HCT: 38.3 % — ABNORMAL LOW (ref 39.0–52.0)
Hemoglobin: 13.4 g/dL (ref 13.0–17.0)
MCHC: 35.4 g/dL (ref 30.0–36.0)
MCV: 87.1 fL (ref 78.0–100.0)
MCV: 87.6 fL (ref 78.0–100.0)
Platelets: 354 10*3/uL (ref 150–400)
RDW: 13.2 % (ref 11.5–15.5)
RDW: 13.5 % (ref 11.5–15.5)
WBC: 19.8 10*3/uL — ABNORMAL HIGH (ref 4.0–10.5)
WBC: 22.6 10*3/uL — ABNORMAL HIGH (ref 4.0–10.5)

## 2011-10-14 LAB — BASIC METABOLIC PANEL
BUN: 12 mg/dL (ref 6–23)
Calcium: 9.4 mg/dL (ref 8.4–10.5)
Chloride: 106 mEq/L (ref 96–112)
Creatinine, Ser: 0.82 mg/dL (ref 0.50–1.35)
GFR calc Af Amer: >90 mL/min (ref >90)
GFR calc non Af Amer: >90 mL/min (ref >90)

## 2011-10-14 LAB — CREATININE, SERUM
GFR calc Af Amer: >90 mL/min (ref >90)
GFR calc non Af Amer: >90 mL/min (ref >90)

## 2011-10-14 MED ORDER — MONTELUKAST SODIUM 10 MG PO TABS
10.0000 mg | ORAL_TABLET | Freq: Every day | ORAL | Status: DC
  Administered 2011-10-14 – 2011-10-16 (×3): 10 mg via ORAL
  Filled 2011-10-14 (×4): qty 1

## 2011-10-14 MED ORDER — METHYLPREDNISOLONE SODIUM SUCC 125 MG IJ SOLR
60.0000 mg | Freq: Four times a day (QID) | INTRAMUSCULAR | Status: DC
  Administered 2011-10-14 – 2011-10-15 (×4): 60 mg via INTRAVENOUS
  Filled 2011-10-14 (×3): qty 0.96
  Filled 2011-10-14: qty 2
  Filled 2011-10-14 (×3): qty 0.96

## 2011-10-14 MED ORDER — PREDNISONE 20 MG PO TABS
60.0000 mg | ORAL_TABLET | Freq: Once | ORAL | Status: AC
  Administered 2011-10-14: 60 mg via ORAL
  Filled 2011-10-14: qty 3

## 2011-10-14 MED ORDER — ACETAMINOPHEN 650 MG RE SUPP: 650.0000 mg | Freq: Four times a day (QID) | RECTAL | Status: DC | PRN

## 2011-10-14 MED ORDER — MOXIFLOXACIN HCL 400 MG PO TABS
400.0000 mg | ORAL_TABLET | Freq: Every day | ORAL | Status: DC
  Administered 2011-10-14 – 2011-10-16 (×3): 400 mg via ORAL
  Filled 2011-10-14 (×5): qty 1

## 2011-10-14 MED ORDER — ALBUTEROL SULFATE (5 MG/ML) 0.5% IN NEBU
5.0000 mg | INHALATION_SOLUTION | Freq: Once | RESPIRATORY_TRACT | Status: AC
  Administered 2011-10-14: 5 mg via RESPIRATORY_TRACT

## 2011-10-14 MED ORDER — SODIUM CHLORIDE 0.9 % IV SOLN
INTRAVENOUS | Status: DC
  Administered 2011-10-14: 16:00:00 via INTRAVENOUS

## 2011-10-14 MED ORDER — HYDROCODONE-ACETAMINOPHEN 5-325 MG PO TABS
1.0000 | ORAL_TABLET | Freq: Four times a day (QID) | ORAL | Status: DC | PRN
  Administered 2011-10-14: 1 via ORAL
  Administered 2011-10-15 – 2011-10-17 (×4): 2 via ORAL
  Filled 2011-10-14 (×3): qty 2
  Filled 2011-10-14: qty 1
  Filled 2011-10-14: qty 2

## 2011-10-14 MED ORDER — ALBUTEROL SULFATE (5 MG/ML) 0.5% IN NEBU: 2.5000 mg | INHALATION_SOLUTION | RESPIRATORY_TRACT | Status: DC | PRN

## 2011-10-14 MED ORDER — ALBUTEROL SULFATE (5 MG/ML) 0.5% IN NEBU: 5.0000 mg | INHALATION_SOLUTION | RESPIRATORY_TRACT | Status: DC

## 2011-10-14 MED ORDER — IPRATROPIUM BROMIDE 0.02 % IN SOLN
RESPIRATORY_TRACT | Status: AC
  Administered 2011-10-14: 0.5 mg via RESPIRATORY_TRACT
  Filled 2011-10-14: qty 2.5

## 2011-10-14 MED ORDER — ACETAMINOPHEN 325 MG PO TABS: 650.0000 mg | ORAL_TABLET | Freq: Four times a day (QID) | ORAL | Status: DC | PRN

## 2011-10-14 MED ORDER — ALBUTEROL SULFATE (5 MG/ML) 0.5% IN NEBU
5.0000 mg | INHALATION_SOLUTION | Freq: Once | RESPIRATORY_TRACT | Status: AC
  Administered 2011-10-14: 5 mg via RESPIRATORY_TRACT
  Filled 2011-10-14: qty 1

## 2011-10-14 MED ORDER — SODIUM CHLORIDE 0.9 % IV SOLN: INTRAVENOUS | Status: DC

## 2011-10-14 MED ORDER — IPRATROPIUM BROMIDE 0.02 % IN SOLN
0.5000 mg | Freq: Once | RESPIRATORY_TRACT | Status: AC
  Administered 2011-10-14: 0.5 mg via RESPIRATORY_TRACT

## 2011-10-14 MED ORDER — SODIUM CHLORIDE 0.9 % IJ SOLN
3.0000 mL | Freq: Two times a day (BID) | INTRAMUSCULAR | Status: DC
  Administered 2011-10-14 – 2011-10-16 (×3): 3 mL via INTRAVENOUS

## 2011-10-14 MED ORDER — ALUM & MAG HYDROXIDE-SIMETH 200-200-20 MG/5ML PO SUSP
30.0000 mL | Freq: Four times a day (QID) | ORAL | Status: DC | PRN
Start: 2011-10-14 — End: 2011-10-17

## 2011-10-14 MED ORDER — ALBUTEROL SULFATE (5 MG/ML) 0.5% IN NEBU
5.0000 mg | INHALATION_SOLUTION | RESPIRATORY_TRACT | Status: DC
  Administered 2011-10-14 – 2011-10-17 (×18): 5 mg via RESPIRATORY_TRACT
  Filled 2011-10-14 (×2): qty 0.5
  Filled 2011-10-14: qty 1
  Filled 2011-10-14: qty 0.5
  Filled 2011-10-14: qty 1
  Filled 2011-10-14: qty 0.5
  Filled 2011-10-14: qty 1
  Filled 2011-10-14: qty 0.5
  Filled 2011-10-14: qty 1
  Filled 2011-10-14 (×3): qty 0.5
  Filled 2011-10-14 (×2): qty 1
  Filled 2011-10-14: qty 0.5
  Filled 2011-10-14: qty 1
  Filled 2011-10-14: qty 0.5
  Filled 2011-10-14: qty 1
  Filled 2011-10-14: qty 0.5
  Filled 2011-10-14 (×2): qty 1
  Filled 2011-10-14: qty 0.5

## 2011-10-14 MED ORDER — ALBUTEROL SULFATE (5 MG/ML) 0.5% IN NEBU
2.5000 mg | INHALATION_SOLUTION | Freq: Once | RESPIRATORY_TRACT | Status: AC
  Administered 2011-10-14: 2.5 mg via RESPIRATORY_TRACT

## 2011-10-14 MED ORDER — GUAIFENESIN-DM 100-10 MG/5ML PO SYRP
5.0000 mL | ORAL_SOLUTION | ORAL | Status: DC | PRN
  Administered 2011-10-14 – 2011-10-16 (×3): 5 mL via ORAL
  Filled 2011-10-14 (×3): qty 5

## 2011-10-14 MED ORDER — MAGNESIUM SULFATE 40 MG/ML IJ SOLN
2.0000 g | Freq: Two times a day (BID) | INTRAMUSCULAR | Status: DC
  Administered 2011-10-14: 2 g via INTRAVENOUS
  Filled 2011-10-14 (×3): qty 50

## 2011-10-14 MED ORDER — IPRATROPIUM BROMIDE 0.02 % IN SOLN
0.5000 mg | RESPIRATORY_TRACT | Status: DC
  Administered 2011-10-13: 16:00:00 via RESPIRATORY_TRACT
  Administered 2011-10-14 – 2011-10-16 (×8): 0.5 mg via RESPIRATORY_TRACT
  Filled 2011-10-14 (×9): qty 2.5

## 2011-10-14 MED ORDER — ENOXAPARIN SODIUM 40 MG/0.4ML ~~LOC~~ SOLN
40.0000 mg | SUBCUTANEOUS | Status: DC
  Administered 2011-10-14 – 2011-10-16 (×3): 40 mg via SUBCUTANEOUS
  Filled 2011-10-14 (×4): qty 0.4

## 2011-10-14 NOTE — Progress Notes (Signed)
Pt asleep at this time. RT will continue to monitor.

## 2011-10-14 NOTE — ED Notes (Signed)
Pre br tx peak flow at 300.

## 2011-10-14 NOTE — ED Notes (Signed)
Pt ambulated in the hall with pa. Pt able to hold sats at 97% but sob and complaining of chest tightness

## 2011-10-14 NOTE — ED Provider Notes (Signed)
Medical screening examination/treatment/procedure(s) were conducted as a shared visit with non-physician practitioner(s) and myself.  I personally evaluated the patient during the encounter   Joya Gaskins, MD 10/14/11 [phone]

## 2011-10-14 NOTE — ED Provider Notes (Signed)
Pt on asthma protocol in CDU.  7:41 AM Seen and examined by me. Pt with problems with medicaid, no PCP, has been having frequent asthma exacerbations and numerous visits to ER. Pt has received steroids in ed, fluids, multiple neb treatments, magnesium iv. Pt feeling better, but states "still tight up top."  Plan for pt to feel better, social worker consult to help sort out his medicaid. Pt states he has not spoke with Child psychotherapist yet. Pt did receive symbacort yesterday from pharmacy. Will continue to monitor.   Exam: Pt in no distress.  Filed Vitals:   10/14/11 0641  BP: 124/67  Pulse: 95  Temp:   Resp: 20  Oxygen sat 95% on RA. Diffuse expiratory wheezes in all lung fields, air movement decreased bilaterally. Regular HR and rhythm. Abdomen soft, non tender.  Will continue to monitor. Will calls social worker at 8a.  Pt not improved after multiple doses of medications, magnesium, nebs. Pt ambulated, sats dropped at 89. Pt became very tachypnec, reported could not breath. Will admit. Pt failed to improve on protocol.  Spoke with [facility], will admit.   Lottie Mussel, [place] 10/14/11 [phone]

## 2011-10-14 NOTE — ED Provider Notes (Signed)
Medical screening examination/treatment/procedure(s) were performed by non-physician practitioner and as supervising physician I was immediately available for consultation/collaboration.   Carleene Cooper III, MD 10/14/11 2043

## 2011-10-14 NOTE — Progress Notes (Signed)
Patient has been in the ED since yesterday receiving breathing treatments for asthma exacerbation.  RT assessed patient and scheduled breathing treatments every 4 hours.  Patient takes Symbicort at home and Albuterol as needed.  Per patient his medication has been out for 3 months.  RT will continue to monitor patient.

## 2011-10-14 NOTE — Progress Notes (Signed)
Financial counselor contacted to meet with patient due to patient expressing concern over status of his Medicaid. Consulted with Felicia of [employer] who was also able to review Medicaid status and confirmed that application is on Day 6 with DSS for approval. Patient is eligible for medication assistance if necessary.

## 2011-10-14 NOTE — Progress Notes (Signed)
Nursing Note: Pt to room with no complaints. Pt's heart monitor placed. Pt educated on falls and importance of calling nurse when need assistance to ambulate. Pt verbalized understanding. Call bell in place. Will continue to monitor pt. Eiko Mcgowen Scientist, clinical (histocompatibility and immunogenetics).

## 2011-10-14 NOTE — Progress Notes (Signed)
Initial review for inpatient status is complete. 

## 2011-10-14 NOTE — H&P (Signed)
History and Physical       Hospital Admission Note Date: 10/14/2011  Patient name: Devin [name] Medical record number: [id number] Date of birth: 1980-09-24 Age: 32 y.o. Gender: male PCP: HOOPER,JEFFREY C, MD   Chief Complaint:  Shortness of breath, chest tightness with wheezing  HPI: Patient is a 31 year old male with history of asthma, ADHD not on any medications presented to ED yesterday with acute asthma attack. Patient is noticed to have frequent exacerbations treated in the ED in the last 2 months. Patient was seen yesterday at [facility] ED, where he received nebulizer treatments and was discharged home. Patient states that he ran out of Symbicort and albuterol for last 3 months due to financial reasons, and hence the frequent exacerbations. Patient returned back to [facility] ED yesterday evening. Per EMS, patient's pulse ox was initially 80% on room air and improved after IV Solu-Medrol and breathing treatments. Patient was placed in the CDU and received multiple and prolonged albuterol nebs, IV Solu-Medrol, IV magnesium, prednisone with no significant improvement in last almost 16 hours. Hospitalist service was requested for admission to continue medical management of acute asthma with status asthmaticus. Patient has no prior history of intubations. At the time of my encounter, patient was receiving albuterol nebulizer treatment, still has significant expiratory wheezing. Patient did ambulate in the hallway, was able to hold his oxygen saturation are 97% but complained of dyspnea and chest tightness. Patient stated that he was coughing with yellowish productive phlegm yesterday but no fevers or chills.  Review of Systems:  Constitutional: Denies fever, chills, diaphoresis, appetite change and fatigue.  HEENT: Denies photophobia, eye pain, redness, hearing loss, ear pain, congestion, sore throat, rhinorrhea, sneezing, mouth sores, trouble  swallowing, neck pain, neck stiffness and tinnitus.   Respiratory: Please see history of present illness.   Cardiovascular: Denies chest pain, palpitations and leg swelling.  Gastrointestinal: Denies nausea, vomiting, abdominal pain, diarrhea, constipation, blood in stool and abdominal distention.  Genitourinary: Denies dysuria, urgency, frequency, hematuria, flank pain and difficulty urinating.  Musculoskeletal: Denies myalgias, back pain, joint swelling, arthralgias and gait problem.  Skin: Denies pallor, rash and wound.  Neurological: Denies dizziness, seizures, syncope, weakness, light-headedness, numbness and headaches.  Hematological: Denies adenopathy. Easy bruising, personal or family bleeding history  Psychiatric/Behavioral: Denies suicidal ideation, mood changes, confusion, nervousness, sleep disturbance and agitation  Past Medical History: Past Medical History  Diagnosis Date  . Asthma   . ADHD (attention deficit hyperactivity disorder)    Past Surgical History  Procedure Date  . Tympanoplasty     Medications: Prior to Admission medications   Medication Sig Start Date End Date Taking? Authorizing Provider  acetaminophen (TYLENOL) 500 MG tablet Take 1,000 mg by mouth every 6 (six) hours as needed. For pain   Yes Historical Provider, MD  albuterol (PROVENTIL) (2.5 MG/3ML) 0.083% nebulizer solution Take 2.5 mg by nebulization every 6 (six) hours as needed. For shortness of breath. 09/20/11 09/19/12 Yes Joya Gaskins, MD  azithromycin (ZITHROMAX) 250 MG tablet Take 250 mg by mouth as directed. 2 tabs by mouth day 1, 1 tab daily days 2-5 10/10/11 10/15/11  Joni Reining Pisciotta, PA-C  predniSONE (DELTASONE) 20 MG tablet Take 2 tablets (40 mg total) by mouth daily. 10/10/11 10/20/11  Joni Reining Pisciotta, PA-C    Allergies:   Allergies  Allergen Reactions  . Amoxicillin-Pot Clavulanate Hives  . Other Nausea And Vomiting    coconut    Social History:  reports that he has never  smoked. He  does not have any smokeless tobacco history on file. He reports that he does not drink alcohol or use illicit drugs.  Family History: Family History  Problem Relation Age of Onset  . Asthma Mother   . Heart failure Mother   . Hyperlipidemia Mother   . Hypertension Mother   . Migraines Mother   . Coronary artery disease Father   . Heart attack Father     Physical Exam: Blood pressure 135/68, pulse 98, temperature 97.9 F (36.6 C), temperature source Oral, resp. rate 20, SpO2 95.00%. General: Alert, awake, oriented x3, in no acute distress. HEENT: anicteric sclera, pink conjunctiva, pupils equal and reactive to light and accomodation Neck: supple, no masses or lymphadenopathy, no goiter, no bruits  Heart: Regular rate and rhythm, without murmurs, rubs or gallops. Lungs: Diffuse expiratory wheezing bilaterally Abdomen: Soft, nontender, nondistended, positive bowel sounds, no masses. Extremities: No clubbing, cyanosis or edema with positive pedal pulses. Neuro: Grossly intact, no focal neurological deficits, strength 5/5 upper and lower extremities bilaterally Psych: alert and oriented x 3, normal mood and affect Skin: no rashes or lesions, warm and dry   LABS on Admission:  Basic Metabolic Panel:  Lab 10/14/11 2130  NA 140  K 3.6  CL 106  CO2 19  GLUCOSE 137*  BUN 12  CREATININE 0.82  CALCIUM 9.4  MG --  PHOS --   CBC:  Lab 10/14/11 0936  WBC 19.8*  NEUTROABS --  HGB 14.1  HCT 39.8  MCV 87.1  PLT 354     Radiological Exams on Admission: Dg Chest 2 View  10/13/2011  *RADIOLOGY REPORT*  Clinical Data: Asthma exacerbation.  Wheezing.  CHEST - 2 VIEW  Comparison: 09/09/2011.  Findings: The cardiac silhouette, mediastinal and hilar contours are normal and stable.  The lungs are clear.  Mild hyperinflation. Minimal peribronchial thickening.  No effusion.  The bony thorax is intact.  IMPRESSION: Mild hyperinflation and peribronchial thickening but no  infiltrates.  Original Report Authenticated By: P. Loralie Champagne, M.D.    Assessment/Plan Present on Admission:  .Asthma with status asthmaticus: Slight improvement after prolonged treatment in ED - Will place on scheduled albuterol and Atrovent nebs q 4hours scheduled and every 2 hours PRN - As patient has received Solu-Medrol and prednisone in ED, I will place him on IV Solu-Medrol, Symbicort, Avelox for bronchial inflammation - Patient will need social work/case management assistance for financial help with his medications   .ADHD (attention deficit hyperactivity disorder) - Patient states that he's not taken his medications since the age of 89 and has been doing well.  DVT prophylaxis: Lovenox  CODE STATUS: Full CODE STATUS  Further plan will depend as patient's clinical course evolves and further radiologic and laboratory data become available.   @Time  Spent on Admission: 45 minutes  Swayze Pries M.D. [facility] 10/14/2011, 12:17 PM Pager: [phone]  If 7PM-7AM, please contact night-coverage [email or web address] Password [email or web address]

## 2011-10-14 NOTE — ED Notes (Signed)
Removed IV in L AC due to pt preference to improve ability to rest comfortably.

## 2011-10-15 DIAGNOSIS — J45902 Unspecified asthma with status asthmaticus: Secondary | ICD-10-CM

## 2011-10-15 DIAGNOSIS — F988 Other specified behavioral and emotional disorders with onset usually occurring in childhood and adolescence: Secondary | ICD-10-CM

## 2011-10-15 LAB — BASIC METABOLIC PANEL
BUN: 14 mg/dL (ref 6–23)
CO2: 19 mEq/L (ref 19–32)
Calcium: 9.3 mg/dL (ref 8.4–10.5)
Creatinine, Ser: 0.75 mg/dL (ref 0.50–1.35)
GFR calc non Af Amer: >90 mL/min (ref >90)
Glucose, Bld: 133 mg/dL — ABNORMAL HIGH (ref 70–99)

## 2011-10-15 LAB — CBC
MCH: 30.1 pg (ref 26.0–34.0)
MCHC: 33.5 g/dL (ref 30.0–36.0)
MCV: 90 fL (ref 78.0–100.0)
Platelets: 340 10*3/uL (ref 150–400)
RDW: 13.7 % (ref 11.5–15.5)

## 2011-10-15 LAB — URINE CULTURE
Colony Count: NO GROWTH
Culture  Setup Time: 201306242245

## 2011-10-15 MED ORDER — METHYLPREDNISOLONE SODIUM SUCC 40 MG IJ SOLR
40.0000 mg | Freq: Three times a day (TID) | INTRAMUSCULAR | Status: DC
  Administered 2011-10-15 – 2011-10-17 (×6): 40 mg via INTRAVENOUS
  Filled 2011-10-15 (×9): qty 1

## 2011-10-15 MED ORDER — PNEUMOCOCCAL VAC POLYVALENT 25 MCG/0.5ML IJ INJ
0.5000 mL | INJECTION | INTRAMUSCULAR | Status: AC
  Administered 2011-10-16: 0.5 mL via INTRAMUSCULAR
  Filled 2011-10-15: qty 0.5

## 2011-10-15 MED ORDER — TRAMADOL HCL 50 MG PO TABS
50.0000 mg | ORAL_TABLET | Freq: Three times a day (TID) | ORAL | Status: DC
  Administered 2011-10-15 – 2011-10-17 (×8): 50 mg via ORAL
  Filled 2011-10-15 (×10): qty 1

## 2011-10-15 NOTE — Progress Notes (Signed)
Patient ID: Devin [name]  male  RUE:[id number]    DOB: [birth year]    DOA: 10/13/2011  PCP: Quentin Mulling, MD  Subjective: Still wheezy however significantly improving today  Objective: Weight change:   Intake/Output Summary (Last 24 hours) at 10/15/11 1233 Last data filed at 10/15/11 1007  Gross per 24 hour  Intake   1059 ml  Output      0 ml  Net   1059 ml   Blood pressure 120/74, pulse 76, temperature 98.2 F (36.8 C), temperature source Oral, resp. rate 20, height 5\' 10"  (1.778 m), weight 90.311 kg (199 lb 1.6 oz), SpO2 98.00%.  Physical Exam: General: Alert and awake, oriented x3, not in any acute distress. HEENT: anicteric sclera, pupils reactive to light and accommodation, EOMI CVS: S1-S2 clear, no murmur rubs or gallops Chest: Bilateral expiratory wheezing, improving air entry Abdomen: soft nontender, nondistended, normal bowel sounds, no organomegaly Extremities: no cyanosis, clubbing or edema noted bilaterally Neuro: Cranial nerves II-XII intact, no focal neurological deficits  Lab Results: Basic Metabolic Panel:  Lab 10/15/11 1478 10/14/11 1720 10/14/11 0936  NA 139 -- 140  K 4.1 -- 3.6  CL 107 -- 106  CO2 19 -- 19  GLUCOSE 133* -- 137*  BUN 14 -- 12  CREATININE 0.75 0.87 --  CALCIUM 9.3 -- 9.4  MG -- -- --  PHOS -- -- --   CBC:  Lab 10/15/11 0608 10/14/11 1720  WBC 25.0* 22.6*  NEUTROABS -- --  HGB 14.2 13.4  HCT 42.4 38.3*  MCV 90.0 87.6  PLT 340 329    Studies/Results: Dg Chest 2 View  10/13/2011  *RADIOLOGY REPORT*  Clinical Data: Asthma exacerbation.  Wheezing.  CHEST - 2 VIEW  Comparison: 09/09/2011.  Findings: The cardiac silhouette, mediastinal and hilar contours are normal and stable.  The lungs are clear.  Mild hyperinflation. Minimal peribronchial thickening.  No effusion.  The bony thorax is intact.  IMPRESSION: Mild hyperinflation and peribronchial thickening but no infiltrates.  Original Report Authenticated By: P. Loralie Champagne, M.D.      Medications: Scheduled Meds:   . sodium chloride  1,000 mL Intravenous Once  . albuterol  5 mg Nebulization Q4H  . budesonide-formoterol  2 puff Inhalation BID  . enoxaparin  40 mg Subcutaneous Q24H  . ipratropium  0.5 mg Nebulization Q4H  . methylPREDNISolone (SOLU-MEDROL) injection  60 mg Intravenous Q6H  . montelukast  10 mg Oral QHS  . moxifloxacin  400 mg Oral Q2000  . sodium chloride  3 mL Intravenous Q12H  . traMADol  50 mg Oral Q8H  . DISCONTD: sodium chloride   Intravenous STAT  . DISCONTD: magnesium sulfate 1 - 4 g bolus IVPB  2 g Intravenous Q12H   Continuous Infusions:   . sodium chloride 75 mL/hr at 10/14/11 1610     Assessment/Plan:   .Asthma with status asthmaticus: Slight improvement since yesterday - Continue on scheduled albuterol and Atrovent nebs q 4hours scheduled and every 2 hours PRN  - Taper IV Solu-Medrol, will transition to oral prednisone tomorrow if improving, Symbicort, Avelox for bronchial inflammation  - Patient will need social work/case management assistance for financial help with his medications   .ADHD (attention deficit hyperactivity disorder)  - Patient states that he's not taken his medications since the age of 65 and has been doing well.   DVT prophylaxis: Lovenox   CODE STATUS: Full CODE STATUS  Disposition: Hopefully tomorrow   LOS: 2 days   Ashlin Hidalgo M.D. [facility] 10/15/2011, 12:33 PM Pager: [phone]  If 7PM-7AM, please contact night-coverage [email or web address] Password [email or web address]

## 2011-10-15 NOTE — Progress Notes (Signed)
CSW cannot provide medication assistance. CSW contacted CM to meet with patient and work on medication assistance. Clinical Social Worker will sign off for now as social work intervention is no longer needed. Please consult [place] again if new need arises.  Sabino Niemann, MSW, [employer] [phone]

## 2011-10-16 ENCOUNTER — Inpatient Hospital Stay (HOSPITAL_COMMUNITY): Payer: Self-pay

## 2011-10-16 DIAGNOSIS — F988 Other specified behavioral and emotional disorders with onset usually occurring in childhood and adolescence: Secondary | ICD-10-CM

## 2011-10-16 DIAGNOSIS — J45902 Unspecified asthma with status asthmaticus: Secondary | ICD-10-CM

## 2011-10-16 NOTE — Progress Notes (Signed)
Subjective: Feels some improvement since admission but not at baseline. He feels tightness, and SOB. He has been having Asthma exacerbation since may. He started to work in [employer] since April.  Objective: Filed Vitals:   10/15/11 2152 10/15/11 2330 10/16/11 0500 10/16/11 0906  BP: 118/76  121/70   Pulse: 81  62   Temp: 97.8 F (36.6 C)  98.4 F (36.9 C)   TempSrc: Oral  Oral   Resp: 18  17   Height:      Weight:   89.177 kg (196 lb 9.6 oz)   SpO2: 97% 96% 97% 94%   Weight change: 0.577 kg (1 lb 4.4 oz)   General: Alert, awake, oriented x3, in no acute distress.  HEENT: No bruits, no goiter.  Heart: Regular rate and rhythm, without murmurs, rubs, gallops.  Lungs: Bilateral Wheezes, bilateral air movement.  Abdomen: Soft, nontender, nondistended, positive bowel sounds.  Neuro: Grossly intact, nonfocal. Extremities; no edema.   Lab Results:  Basename 10/15/11 0608 10/14/11 1720 10/14/11 0936  NA 139 -- 140  K 4.1 -- 3.6  CL 107 -- 106  CO2 19 -- 19  GLUCOSE 133* -- 137*  BUN 14 -- 12  CREATININE 0.75 0.87 --  CALCIUM 9.3 -- 9.4  MG -- -- --  PHOS -- -- --    Basename 10/15/11 0608 10/14/11 1720  WBC 25.0* 22.6*  NEUTROABS -- --  HGB 14.2 13.4  HCT 42.4 38.3*  MCV 90.0 87.6  PLT 340 329    Micro Results: Recent Results (from the past 240 hour(s))  URINE CULTURE     Status: Normal   Collection Time   10/14/11 10:23 PM      Component Value Range Status Comment   Specimen Description URINE, RANDOM   Final    Special Requests NONE   Final    Culture  Setup Time [id number]   Final    Colony Count NO GROWTH   Final    Culture NO GROWTH   Final    Report Status 10/15/2011 FINAL   Final     Studies/Results: No results found.  Medications: I have reviewed the patient's current medications. .Asthma with status asthmaticus: -Patient improving. Not at baseline. Still feels tightness and dyspnea.  - Continue on scheduled albuterol and Atrovent nebs q  4hours scheduled and every 2 hours PRN  - Continue with  IV Solu-Medrol,, Symbicort, Avelox for bronchial inflammation  - Does not qualify for medications assistance per SW note.  -WBC increasing, likely secondary to steroids. I will repeat Chest x ray due to leukocytosis and dyspnea.   .ADHD (attention deficit hyperactivity disorder)  - Patient states that he's not taken his medications since the age of 30 and has been doing well.  DVT prophylaxis: Lovenox  Will plan for discharge tomorrow if clinically improved.      LOS: 3 days   Maize Brittingham M.D.  [facility] 10/16/2011, 9:35 AM

## 2011-10-17 DIAGNOSIS — F988 Other specified behavioral and emotional disorders with onset usually occurring in childhood and adolescence: Secondary | ICD-10-CM

## 2011-10-17 DIAGNOSIS — J45902 Unspecified asthma with status asthmaticus: Secondary | ICD-10-CM

## 2011-10-17 MED ORDER — PREDNISONE 20 MG PO TABS: ORAL_TABLET | ORAL | Status: AC

## 2011-10-17 MED ORDER — BUDESONIDE-FORMOTEROL FUMARATE 160-4.5 MCG/ACT IN AERO: 2.0000 | INHALATION_SPRAY | Freq: Two times a day (BID) | RESPIRATORY_TRACT | Status: DC

## 2011-10-17 MED ORDER — MONTELUKAST SODIUM 10 MG PO TABS: 10.0000 mg | ORAL_TABLET | Freq: Every day | ORAL | Status: DC

## 2011-10-17 MED ORDER — TRAMADOL HCL 50 MG PO TABS: 50.0000 mg | ORAL_TABLET | Freq: Three times a day (TID) | ORAL | Status: AC | PRN

## 2011-10-17 MED ORDER — ALBUTEROL SULFATE HFA 108 (90 BASE) MCG/ACT IN AERS: 2.0000 | INHALATION_SPRAY | Freq: Four times a day (QID) | RESPIRATORY_TRACT | Status: DC | PRN

## 2011-10-17 MED ORDER — MOXIFLOXACIN HCL 400 MG PO TABS: 400.0000 mg | ORAL_TABLET | Freq: Every day | ORAL | Status: AC

## 2011-10-17 MED ORDER — ACETAMINOPHEN 500 MG PO TABS: 500.0000 mg | ORAL_TABLET | Freq: Four times a day (QID) | ORAL | Status: DC | PRN

## 2011-10-17 NOTE — Progress Notes (Signed)
Nursing Note: Pt to be discharged. IV and heart monitor discontinued per MD. Pt received all discharge information. Pt verbalized understanding. Case manager consulted for medication needs. Will discharge pt after case management meet needs of pt. Lakeyia Surber Scientist, clinical (histocompatibility and immunogenetics).

## 2011-10-17 NOTE — Progress Notes (Signed)
Nursing Note: Pt has received all discharge medication from Pharmacy. Pt has no complaints at this time. Pt stated "I need to find a ride because I don't have a way to get home". Cab voucher will be given to pt. Toniqua Melamed Scientist, clinical (histocompatibility and immunogenetics).

## 2011-10-17 NOTE — Discharge Summary (Signed)
Physician Discharge Summary  Vuong Musa AOZ:[id number] DOB: Jun 17, 1980 DOA: 10/13/2011  PCP: Quentin Mulling, MD  Admit date: 10/13/2011 Discharge date: 10/17/2011  Discharge Diagnoses:   Asthma with status asthmaticus  ADHD (attention deficit hyperactivity disorder)   Discharge Condition: Improved.  Disposition: Needs to follow up with PCP.   Diet: Regular.   History of present illness:  Patient is a 31 year old male with history of asthma, ADHD not on any medications presented to ED yesterday with acute asthma attack. Patient is noticed to have frequent exacerbations treated in the ED in the last 2 months. Patient was seen yesterday at [facility] ED, where he received nebulizer treatments and was discharged home. Patient states that he ran out of Symbicort and albuterol for last 3 months due to financial reasons, and hence the frequent exacerbations. Patient returned back to [facility] ED yesterday evening. Per EMS, patient's pulse ox was initially 80% on room air and improved after IV Solu-Medrol and breathing treatments. Patient was placed in the CDU and received multiple and prolonged albuterol nebs, IV Solu-Medrol, IV magnesium, prednisone with no significant improvement in last almost 16 hours. Hospitalist service was requested for admission to continue medical management of acute asthma with status asthmaticus   Hospital Course:   .Asthma with status asthmaticus:  -Patient was admitted  with asthma exacerbation. He was started on IV solumedrol, nebulizer treatment, and avelox. Over course of hospitalization he improved. Chest x ray was repeated and it was negative for PNA. I advised to avoid smoke. He will need to consider changing job. He works in [employer].   .ADHD (attention deficit hyperactivity disorder)  - Patient states that he's not taken his medications since the age of 81 and has been doing well.     Discharge Exam: Filed Vitals:   10/17/11 0421  BP: 113/65    Pulse: 56  Temp: 98.4 F (36.9 C)  Resp: 16   Filed Vitals:   10/17/11 0015 10/17/11 0409 10/17/11 0421 10/17/11 0955  BP:   113/65   Pulse: 56 62 56   Temp:   98.4 F (36.9 C)   TempSrc:   Oral   Resp: 16 16 16    Height:      Weight:   89.268 kg (196 lb 12.8 oz)   SpO2: 99% 97% 96% 95%   General: No distress.  Cardiovascular: S1, S2 RRR. Respiratory: mild expiratory wheezes.   Discharge Instructions   Medication List  As of 10/17/2011 10:41 AM   STOP taking these medications         azithromycin 250 MG tablet         TAKE these medications         acetaminophen 500 MG tablet   Commonly known as: TYLENOL   Take 1 tablet (500 mg total) by mouth every 6 (six) hours as needed. For pain      albuterol (2.5 MG/3ML) 0.083% nebulizer solution   Commonly known as: PROVENTIL   Take 2.5 mg by nebulization every 6 (six) hours as needed. For shortness of breath.      albuterol 108 (90 BASE) MCG/ACT inhaler   Commonly known as: PROVENTIL HFA;VENTOLIN HFA   Inhale 2 puffs into the lungs every 6 (six) hours as needed for wheezing.      budesonide-formoterol 160-4.5 MCG/ACT inhaler   Commonly known as: SYMBICORT   Inhale 2 puffs into the lungs 2 (two) times daily.      montelukast 10 MG tablet   Commonly  known as: SINGULAIR   Take 1 tablet (10 mg total) by mouth at bedtime.      moxifloxacin 400 MG tablet   Commonly known as: AVELOX   Take 1 tablet (400 mg total) by mouth daily at 8 pm.      predniSONE 20 MG tablet   Commonly known as: DELTASONE   Take 3 tablet by mouth for 4 days then 2 tablet by mouth for 3 days then 1 tablet by mouth for 2 days then stop.      traMADol 50 MG tablet   Commonly known as: ULTRAM   Take 1 tablet (50 mg total) by mouth every 8 (eight) hours as needed for pain.              The results of significant diagnostics from this hospitalization (including imaging, microbiology, ancillary and laboratory) are listed below for reference.     Significant Diagnostic Studies: Dg Chest 2 View  10/16/2011  *RADIOLOGY REPORT*  Clinical Data: Shortness of breath.  Asthma.  Chest tightness.  CHEST - 2 VIEW  Comparison: 10/13/2011  Findings: Heart size and vascularity are normal and the lungs are clear except for slight peribronchial thickening.  No effusions. No osseous abnormality.  IMPRESSION: Chronic or recurrent bronchitis.  Original Report Authenticated By: Gwynn Burly, M.D.   Dg Chest 2 View  10/13/2011  *RADIOLOGY REPORT*  Clinical Data: Asthma exacerbation.  Wheezing.  CHEST - 2 VIEW  Comparison: 09/09/2011.  Findings: The cardiac silhouette, mediastinal and hilar contours are normal and stable.  The lungs are clear.  Mild hyperinflation. Minimal peribronchial thickening.  No effusion.  The bony thorax is intact.  IMPRESSION: Mild hyperinflation and peribronchial thickening but no infiltrates.  Original Report Authenticated By: P. Loralie Champagne, M.D.    Microbiology: Recent Results (from the past 240 hour(s))  URINE CULTURE     Status: Normal   Collection Time   10/14/11 10:23 PM      Component Value Range Status Comment   Specimen Description URINE, RANDOM   Final    Special Requests NONE   Final    Culture  Setup Time [id number]   Final    Colony Count NO GROWTH   Final    Culture NO GROWTH   Final    Report Status 10/15/2011 FINAL   Final      Labs: Basic Metabolic Panel:  Lab 10/15/11 9629 10/14/11 1720 10/14/11 0936  NA 139 -- 140  K 4.1 -- 3.6  CL 107 -- 106  CO2 19 -- 19  GLUCOSE 133* -- 137*  BUN 14 -- 12  CREATININE 0.75 0.87 0.82  CALCIUM 9.3 -- 9.4  MG -- -- --  PHOS -- -- --   CBC:  Lab 10/15/11 0608 10/14/11 1720 10/14/11 0936  WBC 25.0* 22.6* 19.8*  NEUTROABS -- -- --  HGB 14.2 13.4 14.1  HCT 42.4 38.3* 39.8  MCV 90.0 87.6 87.1  PLT 340 329 354    Time coordinating discharge: 30 minutes.   SignedHartley Barefoot  [facility] 10/17/2011, 10:41 AM

## 2011-11-19 ENCOUNTER — Encounter (HOSPITAL_COMMUNITY): Payer: Self-pay | Admitting: Emergency Medicine

## 2011-11-19 ENCOUNTER — Emergency Department (HOSPITAL_COMMUNITY): Payer: Self-pay

## 2011-11-19 ENCOUNTER — Emergency Department (HOSPITAL_COMMUNITY)
Admission: EM | Admit: 2011-11-19 | Discharge: 2011-11-19 | Disposition: A | Discharge status: Home / Self Care | Payer: Self-pay | Attending: Emergency Medicine | Admitting: Emergency Medicine

## 2011-11-19 DIAGNOSIS — J45901 Unspecified asthma with (acute) exacerbation: Secondary | ICD-10-CM | POA: Insufficient documentation

## 2011-11-19 DIAGNOSIS — F909 Attention-deficit hyperactivity disorder, unspecified type: Secondary | ICD-10-CM | POA: Insufficient documentation

## 2011-11-19 MED ORDER — ALBUTEROL (5 MG/ML) CONTINUOUS INHALATION SOLN
INHALATION_SOLUTION | RESPIRATORY_TRACT | Status: AC
  Filled 2011-11-19: qty 20

## 2011-11-19 MED ORDER — IPRATROPIUM BROMIDE 0.02 % IN SOLN
0.5000 mg | Freq: Once | RESPIRATORY_TRACT | Status: AC
  Administered 2011-11-19: 0.5 mg via RESPIRATORY_TRACT
  Filled 2011-11-19: qty 2.5

## 2011-11-19 MED ORDER — ALBUTEROL SULFATE HFA 108 (90 BASE) MCG/ACT IN AERS
2.0000 | INHALATION_SPRAY | RESPIRATORY_TRACT | Status: DC
  Administered 2011-11-19: 2 via RESPIRATORY_TRACT
  Filled 2011-11-19: qty 6.7

## 2011-11-19 MED ORDER — ALBUTEROL SULFATE (5 MG/ML) 0.5% IN NEBU
5.0000 mg | INHALATION_SOLUTION | Freq: Once | RESPIRATORY_TRACT | Status: AC
  Administered 2011-11-19: 5 mg via RESPIRATORY_TRACT
  Filled 2011-11-19: qty 40

## 2011-11-19 MED ORDER — METHYLPREDNISOLONE SODIUM SUCC 125 MG IJ SOLR
INTRAMUSCULAR | Status: AC
  Filled 2011-11-19: qty 2

## 2011-11-19 MED ORDER — IPRATROPIUM BROMIDE 0.02 % IN SOLN
RESPIRATORY_TRACT | Status: AC
  Filled 2011-11-19: qty 2.5

## 2011-11-19 MED ORDER — PREDNISONE 10 MG PO TABS: 60.0000 mg | ORAL_TABLET | Freq: Every day | ORAL | Status: DC

## 2011-11-19 NOTE — ED Notes (Signed)
Pt states he has had SOB all day, last breathing tx at home was 5 pm.  Still c/o "tightness" after EMS breathing tx.  2nd breathing tx started now.

## 2011-11-19 NOTE — ED Notes (Signed)
Pt states he feels somewhat better after treatment, 93% on RA with slight expiratory wheezing.

## 2011-11-19 NOTE — ED Provider Notes (Signed)
History     CSN: [id number]  Arrival date & time 11/19/11  7829   First MD Initiated Contact with Patient 11/19/11 0236      Chief Complaint  Patient presents with  . Shortness of Breath     The history is provided by the patient and medical records.   the patient awoke with increasing shortness of breath today.  He does not smoke cigarettes.  He has a long-standing history of asthma.  He tried his albuterol home without improvement in symptoms and thus he called 911.  EMS gave Solu-Medrol and nebulized respiratory treatments in route with some improvement in his symptoms.  He denies recent fevers or chills.  His had no productive cough.  Is no history of DVT or pulmonary embolism.  He denies unilateral leg swelling.  He has no recent long travel.  His symptoms are mild to moderate in severity.  Nothing worsens or improves his symptoms  Past Medical History  Diagnosis Date  . Asthma   . ADHD (attention deficit hyperactivity disorder)     Past Surgical History  Procedure Date  . Tympanoplasty     Family History  Problem Relation Age of Onset  . Asthma Mother   . Heart failure Mother   . Hyperlipidemia Mother   . Hypertension Mother   . Migraines Mother   . Coronary artery disease Father   . Heart attack Father     History  Substance Use Topics  . Smoking status: Never Smoker   . Smokeless tobacco: Not on file  . Alcohol Use: No     occasionally      Review of Systems  Respiratory: Positive for shortness of breath.   All other systems reviewed and are negative.    Allergies  Amoxicillin-pot clavulanate and Other  Home Medications   Current Outpatient Rx  Name Route Sig Dispense Refill  . ALBUTEROL SULFATE HFA 108 (90 BASE) MCG/ACT IN AERS Inhalation Inhale 2 puffs into the lungs every 6 (six) hours as needed for wheezing. 1 Inhaler 2  . ALBUTEROL SULFATE (2.5 MG/3ML) 0.083% IN NEBU Nebulization Take 2.5 mg by nebulization every 6 (six) hours as needed. For  shortness of breath.    Marland Kitchen PREDNISONE 10 MG PO TABS Oral Take 6 tablets (60 mg total) by mouth daily. 30 tablet 0    BP 118/65  Temp 98 F (36.7 C) (Oral)  Resp 23  SpO2 95%  Physical Exam  Nursing note and vitals reviewed. Constitutional: He is oriented to person, place, and time. He appears well-developed and well-nourished.  HENT:  Head: Normocephalic and atraumatic.  Eyes: EOM are normal.  Neck: Normal range of motion.  Cardiovascular: Normal rate, regular rhythm, normal heart sounds and intact distal pulses.   Pulmonary/Chest: Effort normal. No respiratory distress. He has wheezes.  Abdominal: Soft. He exhibits no distension. There is no tenderness.  Musculoskeletal: Normal range of motion.  Neurological: He is alert and oriented to person, place, and time.  Skin: Skin is warm and dry.  Psychiatric: He has a normal mood and affect. Judgment normal.    ED Course  Procedures (including critical care time)  Labs Reviewed - No data to display Dg Chest Portable 1 View  11/19/2011  *RADIOLOGY REPORT*  Clinical Data: Asthma, shortness of breath.  PORTABLE CHEST - 1 VIEW  Comparison: 10/16/2011  Findings: Minimal lung base opacities.  Lungs otherwise clear.  No pleural effusion or pneumothorax.  Cardiomediastinal contours are within normal range.  Mild  peribronchial thickening.  No acute osseous finding.  IMPRESSION: Mild peribronchial thickening is similar to prior.  Mild bibasilar atelectasis.  Original Report Authenticated By: Waneta Martins, M.D.    I personally reviewed the imaging tests through PACS system  I reviewed available ER/hospitalization records thought the EMR   1. Asthma exacerbation       MDM  Is what appears to be an acute asthma exacerbation.  He received treatments in route.  The patient received Solu-Medrol in route.      4:49 AM Patient reports his breathing is much better at this time.  Discharge home with prednisone.  The patient request an  albuterol inhaler for home.  Patient is nebulized albuterol at home    Lyanne Co, MD 11/19/11 [phone]

## 2011-11-19 NOTE — ED Notes (Signed)
Patient woke up SOB, asthma attack.  Used Albuterol inhaler with no relief.  Audible wheezing upon arrival, PTAR did nebulizer.  EMS administered 125mg  of Solumedrol and atrovent/albuterol mix.  Oxygen saturation was  99%, BP122/76, HR98, resp18.  EMS placed PIV #18 left AC.

## 2011-12-05 ENCOUNTER — Emergency Department (HOSPITAL_COMMUNITY)
Admission: EM | Admit: 2011-12-05 | Discharge: 2011-12-05 | Disposition: A | Discharge status: Home / Self Care | Payer: Self-pay | Attending: Emergency Medicine | Admitting: Emergency Medicine

## 2011-12-05 DIAGNOSIS — J45901 Unspecified asthma with (acute) exacerbation: Secondary | ICD-10-CM | POA: Insufficient documentation

## 2011-12-05 MED ORDER — PREDNISONE 20 MG PO TABS: 60.0000 mg | ORAL_TABLET | Freq: Every day | ORAL | Status: DC

## 2011-12-05 MED ORDER — ALBUTEROL (5 MG/ML) CONTINUOUS INHALATION SOLN
INHALATION_SOLUTION | RESPIRATORY_TRACT | Status: AC
  Administered 2011-12-05: 08:00:00
  Filled 2011-12-05: qty 80

## 2011-12-05 MED ORDER — METHYLPREDNISOLONE SODIUM SUCC 125 MG IJ SOLR
125.0000 mg | Freq: Once | INTRAMUSCULAR | Status: AC
  Administered 2011-12-05: 125 mg via INTRAVENOUS
  Filled 2011-12-05: qty 2

## 2011-12-05 MED ORDER — IPRATROPIUM BROMIDE 0.02 % IN SOLN
RESPIRATORY_TRACT | Status: AC
  Administered 2011-12-05: 08:00:00
  Filled 2011-12-05: qty 2.5

## 2011-12-05 MED ORDER — ALBUTEROL SULFATE HFA 108 (90 BASE) MCG/ACT IN AERS
2.0000 | INHALATION_SPRAY | Freq: Once | RESPIRATORY_TRACT | Status: AC
  Administered 2011-12-05: 2 via RESPIRATORY_TRACT
  Filled 2011-12-05: qty 6.7

## 2011-12-05 NOTE — ED Provider Notes (Signed)
I saw and evaluated the patient, reviewed the resident's note and I agree with the findings and plan. Patient with a history of asthma who had run out of his nebulizers and did not have an inhaler came in with diffuse wheezing. He was in no acute distress and substantially improved after albuterol and Atrovent. Patient was given an albuterol inhaler prior to discharge.  Gwyneth Sprout, MD 12/05/11 1000

## 2011-12-05 NOTE — ED Provider Notes (Signed)
History     CSN: [id number]  Arrival date & time 12/05/11  0757   First MD Initiated Contact with Patient 12/05/11 0802      Chief Complaint  Patient presents with  . Asthma    (Consider location/radiation/quality/duration/timing/severity/associated sxs/prior treatment) Patient is a 31 y.o. male presenting with asthma. The history is provided by the patient.  Asthma This is a recurrent problem. The current episode started today. The problem occurs constantly. Progression since onset: improving. Associated symptoms include coughing (not productive) and vomiting (2 days ago, now resolved). Pertinent negatives include no abdominal pain, chest pain, chills, fever, headaches or neck pain. Exacerbated by: allergies, is out of meds. Treatments tried: albuterol and atrovent. The treatment provided moderate relief.    Past Medical History  Diagnosis Date  . Asthma   . ADHD (attention deficit hyperactivity disorder)     Past Surgical History  Procedure Date  . Tympanoplasty     Family History  Problem Relation Age of Onset  . Asthma Mother   . Heart failure Mother   . Hyperlipidemia Mother   . Hypertension Mother   . Migraines Mother   . Coronary artery disease Father   . Heart attack Father     History  Substance Use Topics  . Smoking status: Never Smoker   . Smokeless tobacco: Not on file  . Alcohol Use: No     occasionally      Review of Systems  Constitutional: Negative for fever and chills.  HENT: Positive for rhinorrhea. Negative for neck pain.   Eyes: Negative.   Respiratory: Positive for cough (not productive), chest tightness and shortness of breath.   Cardiovascular: Negative for chest pain and leg swelling.  Gastrointestinal: Positive for vomiting (2 days ago, now resolved) and diarrhea (2 days ago, now resolved). Negative for abdominal pain.  Genitourinary: Negative.  Negative for dysuria and decreased urine volume.  Musculoskeletal: Negative.   Skin:  Negative.   Neurological: Negative for light-headedness and headaches.  Hematological: Negative.   Psychiatric/Behavioral: Negative.   All other systems reviewed and are negative.    Allergies  Amoxicillin-pot clavulanate and Other  Home Medications   Current Outpatient Rx  Name Route Sig Dispense Refill  . ALBUTEROL SULFATE HFA 108 (90 BASE) MCG/ACT IN AERS Inhalation Inhale 2 puffs into the lungs every 6 (six) hours as needed for wheezing. 1 Inhaler 2  . ALBUTEROL SULFATE (2.5 MG/3ML) 0.083% IN NEBU Nebulization Take 2.5 mg by nebulization every 6 (six) hours as needed. For shortness of breath.    Marland Kitchen PREDNISONE 10 MG PO TABS Oral Take 6 tablets (60 mg total) by mouth daily. 30 tablet 0    BP 106/70  Pulse 57  Temp 97.9 F (36.6 C) (Oral)  Resp 18  SpO2 99%  Physical Exam  Nursing note and vitals reviewed. Constitutional: He is oriented to person, place, and time. He appears well-developed and well-nourished. No distress.  HENT:  Head: Normocephalic and atraumatic.  Right Ear: External ear normal.  Left Ear: External ear normal.  Nose: Nose normal.  Eyes: Right eye exhibits no discharge. Left eye exhibits no discharge.  Neck: Neck supple.  Cardiovascular: Normal rate, regular rhythm, normal heart sounds and intact distal pulses.   Pulmonary/Chest: Effort normal. No accessory muscle usage. Not tachypneic. No respiratory distress. He has wheezes.  Abdominal: Soft. He exhibits no distension. There is no tenderness.  Musculoskeletal: He exhibits no edema and no tenderness.  Neurological: He is alert and oriented to  person, place, and time.  Skin: Skin is warm and dry.    ED Course  Procedures (including critical care time)  Labs Reviewed - No data to display No results found.   1. Asthma with acute exacerbation       MDM  31 yo male with frequent asthma exacerbations with recurrence this AM. Dry cough without fevers. Diffuse wheezing, giving solumedrol and  breathing treatments. Doubt PNA. Improved substantially with nebs and feels back to his baseline. Due to money issues has been unable to fill his nebulizer, will give inhaler here and he says he should be able to get his steroids and nebulizer refill in next day or two.        Pricilla Loveless, MD 12/05/11 [phone]

## 2011-12-05 NOTE — ED Notes (Signed)
Pt reports he ran out of his home albuterol. Began wheezing this AM around 630. Arrives via EMS who gave 10mg  albuterol, 0.5mg  atrovent, started 20g IV LAC. Pt states he's beginning to feel better.

## 2011-12-10 ENCOUNTER — Encounter (HOSPITAL_COMMUNITY): Payer: Self-pay | Admitting: Emergency Medicine

## 2011-12-10 ENCOUNTER — Emergency Department (HOSPITAL_COMMUNITY)
Admission: EM | Admit: 2011-12-10 | Discharge: 2011-12-10 | Disposition: A | Discharge status: Home / Self Care | Payer: Self-pay | Attending: Emergency Medicine | Admitting: Emergency Medicine

## 2011-12-10 DIAGNOSIS — J45901 Unspecified asthma with (acute) exacerbation: Secondary | ICD-10-CM | POA: Insufficient documentation

## 2011-12-10 DIAGNOSIS — F909 Attention-deficit hyperactivity disorder, unspecified type: Secondary | ICD-10-CM | POA: Insufficient documentation

## 2011-12-10 MED ORDER — ALBUTEROL SULFATE HFA 108 (90 BASE) MCG/ACT IN AERS
2.0000 | INHALATION_SPRAY | Freq: Once | RESPIRATORY_TRACT | Status: AC
  Administered 2011-12-10: 2 via RESPIRATORY_TRACT
  Filled 2011-12-10: qty 6.7

## 2011-12-10 MED ORDER — ALBUTEROL SULFATE (5 MG/ML) 0.5% IN NEBU
10.0000 mg | INHALATION_SOLUTION | Freq: Once | RESPIRATORY_TRACT | Status: AC
  Administered 2011-12-10: 10 mg via RESPIRATORY_TRACT
  Filled 2011-12-10: qty 2

## 2011-12-10 MED ORDER — DEXAMETHASONE 6 MG PO TABS
10.0000 mg | ORAL_TABLET | Freq: Once | ORAL | Status: AC
  Administered 2011-12-10: 10 mg via ORAL
  Filled 2011-12-10: qty 1

## 2011-12-10 MED ORDER — ALBUTEROL SULFATE (5 MG/ML) 0.5% IN NEBU
INHALATION_SOLUTION | RESPIRATORY_TRACT | Status: AC
  Filled 2011-12-10: qty 1

## 2011-12-10 MED ORDER — DEXAMETHASONE 4 MG PO TABS: 8.0000 mg | ORAL_TABLET | Freq: Once | ORAL | Status: AC

## 2011-12-10 MED ORDER — IPRATROPIUM BROMIDE 0.02 % IN SOLN
0.5000 mg | Freq: Once | RESPIRATORY_TRACT | Status: AC
  Administered 2011-12-10: 0.5 mg via RESPIRATORY_TRACT
  Filled 2011-12-10: qty 2.5

## 2011-12-10 NOTE — ED Notes (Signed)
Patient complaint of increased shortness of breath over the last 2 days.  Patient has history of Asthma.  Patient stated breathing worse over the last 8 hours.  EMS reports patient been without asthma for the last 3 days.  EMS reports they gave albuterol 5 mg neb with improved lung sounds.  Initially wheezing bilaterally.

## 2011-12-10 NOTE — ED Notes (Signed)
Continuous nebulizer finished, patient eating sandwich.  States he is feeling much better.

## 2011-12-15 NOTE — ED Provider Notes (Signed)
History    31yM w/dyspnea. Hx of asthma and says feels similar to previous exacerbations. Denies cp. Occasional nonproductive cough. No fever or chills. No unusual leg pain or swelling. Recently seen for same symptoms but says couldn't afford the meds.  CSN: [id number]  Arrival date & time 12/10/11  1946   First MD Initiated Contact with Patient 12/10/11 1956      Chief Complaint  Patient presents with  . Asthma    (Consider location/radiation/quality/duration/timing/severity/associated sxs/prior treatment) HPI  Past Medical History  Diagnosis Date  . Asthma   . ADHD (attention deficit hyperactivity disorder)     Past Surgical History  Procedure Date  . Tympanoplasty     Family History  Problem Relation Age of Onset  . Asthma Mother   . Heart failure Mother   . Hyperlipidemia Mother   . Hypertension Mother   . Migraines Mother   . Coronary artery disease Father   . Heart attack Father     History  Substance Use Topics  . Smoking status: Never Smoker   . Smokeless tobacco: Not on file  . Alcohol Use: No     occasionally      Review of Systems   Review of symptoms negative unless otherwise noted in HPI.   Allergies  Amoxicillin-pot clavulanate and Other  Home Medications   Current Outpatient Rx  Name Route Sig Dispense Refill  . ALBUTEROL SULFATE HFA 108 (90 BASE) MCG/ACT IN AERS Inhalation Inhale 2 puffs into the lungs every 4 (four) hours as needed. For asthma    . ALBUTEROL SULFATE (2.5 MG/3ML) 0.083% IN NEBU Nebulization Take 2.5 mg by nebulization every 6 (six) hours as needed. For shortness of breath.    Marland Kitchen PREDNISONE 20 MG PO TABS Oral Take 3 tablets (60 mg total) by mouth daily. 12 tablet 0  . ALBUTEROL SULFATE HFA 108 (90 BASE) MCG/ACT IN AERS Inhalation Inhale 2 puffs into the lungs every 6 (six) hours as needed. For asthma    . DEXAMETHASONE 4 MG PO TABS Oral Take 2 tablets (8 mg total) by mouth once. 2 tablet 0    BP 105/67  Pulse 77   Temp 98.1 F (36.7 C) (Oral)  Resp 17  SpO2 98%  Physical Exam  Nursing note and vitals reviewed. Constitutional: He appears well-developed and well-nourished. No distress.  HENT:  Head: Normocephalic and atraumatic.  Eyes: Conjunctivae are normal. Right eye exhibits no discharge. Left eye exhibits no discharge.  Neck: Normal range of motion. Neck supple.  Cardiovascular: Normal rate, regular rhythm and normal heart sounds.  Exam reveals no gallop and no friction rub.   No murmur heard. Pulmonary/Chest: Effort normal. No respiratory distress. He has wheezes.       Mild expiratory wheezing b/l  Abdominal: Soft. He exhibits no distension. There is no tenderness.  Musculoskeletal: He exhibits no edema and no tenderness.       Lower extremities symmetric as compared to each other. No calf tenderness. Negative Homan's. No palpable cords.   Neurological: He is alert.  Skin: Skin is warm and dry.  Psychiatric: He has a normal mood and affect. His behavior is normal. Thought content normal.    ED Course  Procedures (including critical care time)  Labs Reviewed - No data to display No results found.   1. Asthma exacerbation       MDM  31 year old male with asthma exacerbation. Patient was recently evaluated for the same. He was discharged with prescriptions for albuterol  inhaler and steroids. He did not fill this because of financial constraints. Patient was treated with nebulized treatments emergency room. He was also given inhaler prior to discharge. He is given a dose of Decadron and a prescription for another dose. Return precautions discussed. Outpt fu.        [name], MD 12/15/11 [phone]

## 2012-01-05 ENCOUNTER — Emergency Department (HOSPITAL_COMMUNITY)
Admission: EM | Admit: 2012-01-05 | Discharge: 2012-01-05 | Disposition: A | Discharge status: Home / Self Care | Payer: Self-pay | Attending: Emergency Medicine | Admitting: Emergency Medicine

## 2012-01-05 DIAGNOSIS — J45901 Unspecified asthma with (acute) exacerbation: Secondary | ICD-10-CM

## 2012-01-05 DIAGNOSIS — F909 Attention-deficit hyperactivity disorder, unspecified type: Secondary | ICD-10-CM | POA: Insufficient documentation

## 2012-01-05 DIAGNOSIS — J45909 Unspecified asthma, uncomplicated: Secondary | ICD-10-CM | POA: Insufficient documentation

## 2012-01-05 MED ORDER — DEXAMETHASONE SODIUM PHOSPHATE 10 MG/ML IJ SOLN: 10.0000 mg | Freq: Once | INTRAMUSCULAR | Status: DC

## 2012-01-05 MED ORDER — DEXAMETHASONE SODIUM PHOSPHATE 10 MG/ML IJ SOLN
10.0000 mg | Freq: Once | INTRAMUSCULAR | Status: AC
  Administered 2012-01-05: 10 mg via INTRAMUSCULAR
  Filled 2012-01-05: qty 1

## 2012-01-05 MED ORDER — PREDNISONE 50 MG PO TABS: 50.0000 mg | ORAL_TABLET | Freq: Every day | ORAL | Status: DC

## 2012-01-05 MED ORDER — ALBUTEROL SULFATE HFA 108 (90 BASE) MCG/ACT IN AERS: 1.0000 | INHALATION_SPRAY | Freq: Four times a day (QID) | RESPIRATORY_TRACT | Status: DC | PRN

## 2012-01-05 MED ORDER — ALBUTEROL SULFATE (5 MG/ML) 0.5% IN NEBU
5.0000 mg | INHALATION_SOLUTION | Freq: Once | RESPIRATORY_TRACT | Status: AC
  Administered 2012-01-05: 5 mg via RESPIRATORY_TRACT
  Filled 2012-01-05: qty 1

## 2012-01-05 NOTE — ED Notes (Signed)
Dr. Rhunette Croft notified of same.

## 2012-01-05 NOTE — Progress Notes (Signed)
Pt barcode not scanned due to issues with WOW scanner - no other avail workstation at this time

## 2012-01-05 NOTE — ED Provider Notes (Signed)
History     CSN: [id number]  Arrival date & time 01/05/12  1446   First MD Initiated Contact with Patient 01/05/12 1510      Chief Complaint  Patient presents with  . Asthma    (Consider location/radiation/quality/duration/timing/severity/associated sxs/prior treatment) HPI Comments: Pt comes in with cc of 2 day hx of dib. Pt states that his usual triggers are weather changes, allergens. He started having worsening dib, and called EMS. Pt has no cough, n/v/f/c. Pt was given 2 treatments prior to my arrival and feel a lot better already. No intubations, icu admissions.  Patient is a 31 y.o. male presenting with asthma. The history is provided by the patient and medical records.  Asthma Associated symptoms include shortness of breath. Pertinent negatives include no chest pain and no abdominal pain.    Past Medical History  Diagnosis Date  . Asthma   . ADHD (attention deficit hyperactivity disorder)     Past Surgical History  Procedure Date  . Tympanoplasty     Family History  Problem Relation Age of Onset  . Asthma Mother   . Heart failure Mother   . Hyperlipidemia Mother   . Hypertension Mother   . Migraines Mother   . Coronary artery disease Father   . Heart attack Father     History  Substance Use Topics  . Smoking status: Never Smoker   . Smokeless tobacco: Not on file  . Alcohol Use: No     occasionally      Review of Systems  Constitutional: Negative for activity change and appetite change.  Respiratory: Positive for shortness of breath and wheezing. Negative for cough.   Cardiovascular: Negative for chest pain.  Gastrointestinal: Negative for abdominal pain.  Genitourinary: Negative for dysuria.    Allergies  Amoxicillin-pot clavulanate and Other  Home Medications   Current Outpatient Rx  Name Route Sig Dispense Refill  . ALBUTEROL SULFATE (2.5 MG/3ML) 0.083% IN NEBU Nebulization Take 2.5 mg by nebulization every 6 (six) hours as needed. For  shortness of breath.    . ALBUTEROL SULFATE HFA 108 (90 BASE) MCG/ACT IN AERS Inhalation Inhale 2 puffs into the lungs every 4 (four) hours as needed. For asthma      BP 99/55  Pulse 70  Temp 98 F (36.7 C) (Oral)  SpO2 96%  Physical Exam  Constitutional: He is oriented to person, place, and time. He appears well-developed.  HENT:  Head: Normocephalic and atraumatic.  Eyes: Conjunctivae normal and EOM are normal. Pupils are equal, round, and reactive to light.  Neck: Normal range of motion. Neck supple.  Cardiovascular: Normal rate and regular rhythm.   Pulmonary/Chest: Effort normal and breath sounds normal. No respiratory distress. He has no wheezes. He has no rales. He exhibits no tenderness.  Abdominal: Soft. Bowel sounds are normal. He exhibits no distension. There is no tenderness. There is no rebound and no guarding.  Neurological: He is alert and oriented to person, place, and time.  Skin: Skin is warm.    ED Course  Procedures (including critical care time)  Labs Reviewed - No data to display No results found.   No diagnosis found.    MDM  Pt comes in with cc of asthma. Pt has been taking some nebs at home, and yet his dib is getting worse. He is s/p 2 tretments and is breathing very comfortably with no wheezing on my exam. Will give 1 more nebs and then discharge with prednisone. He has nebs at  home.        Derwood Kaplan, MD 01/05/12 [phone]

## 2012-01-05 NOTE — ED Notes (Signed)
Patient brought in by EMS. History of Asthma. Started to have SOB. Wheezing allo lobes per EMS. Patient received 5 mg of Albuterol and of.5 of Atrovent.

## 2012-01-05 NOTE — ED Notes (Signed)
Discharge instructions reviewed w/ pt., verbalizes understanding. Two prescriptions provided at discharge. 

## 2012-01-05 NOTE — ED Notes (Signed)
Pt upset he was not provided an inhaler at discharge, when nurse entered room to discharge pt, pt "jacking off beneath the cover". Pt states "I'll just be back later tonight if I don't get an inhaler". Charge nurse, Patti notified of same.

## 2012-08-02 ENCOUNTER — Encounter (HOSPITAL_COMMUNITY): Payer: Self-pay | Admitting: Nurse Practitioner

## 2012-08-02 ENCOUNTER — Emergency Department (HOSPITAL_COMMUNITY)
Admission: EM | Admit: 2012-08-02 | Discharge: 2012-08-02 | Disposition: A | Discharge status: Home / Self Care | Payer: Self-pay | Attending: Emergency Medicine | Admitting: Emergency Medicine

## 2012-08-02 DIAGNOSIS — F909 Attention-deficit hyperactivity disorder, unspecified type: Secondary | ICD-10-CM | POA: Insufficient documentation

## 2012-08-02 DIAGNOSIS — R112 Nausea with vomiting, unspecified: Secondary | ICD-10-CM | POA: Insufficient documentation

## 2012-08-02 DIAGNOSIS — J45909 Unspecified asthma, uncomplicated: Secondary | ICD-10-CM | POA: Insufficient documentation

## 2012-08-02 DIAGNOSIS — R6883 Chills (without fever): Secondary | ICD-10-CM | POA: Insufficient documentation

## 2012-08-02 DIAGNOSIS — M545 Low back pain, unspecified: Secondary | ICD-10-CM | POA: Insufficient documentation

## 2012-08-02 DIAGNOSIS — Z79899 Other long term (current) drug therapy: Secondary | ICD-10-CM | POA: Insufficient documentation

## 2012-08-02 DIAGNOSIS — R0602 Shortness of breath: Secondary | ICD-10-CM | POA: Insufficient documentation

## 2012-08-02 LAB — URINALYSIS, ROUTINE W REFLEX MICROSCOPIC
Bilirubin Urine: NEGATIVE
Glucose, UA: NEGATIVE mg/dL
Hgb urine dipstick: NEGATIVE
Ketones, ur: 40 mg/dL — AB
Leukocytes, UA: NEGATIVE
pH: 8 (ref 5.0–8.0)

## 2012-08-02 MED ORDER — ONDANSETRON HCL 4 MG/2ML IJ SOLN
4.0000 mg | Freq: Once | INTRAMUSCULAR | Status: AC
  Administered 2012-08-02: 4 mg via INTRAVENOUS
  Filled 2012-08-02: qty 2

## 2012-08-02 MED ORDER — ALBUTEROL SULFATE (5 MG/ML) 0.5% IN NEBU
5.0000 mg | INHALATION_SOLUTION | Freq: Once | RESPIRATORY_TRACT | Status: AC
  Administered 2012-08-02: 5 mg via RESPIRATORY_TRACT
  Filled 2012-08-02: qty 1

## 2012-08-02 MED ORDER — ALBUTEROL SULFATE HFA 108 (90 BASE) MCG/ACT IN AERS
2.0000 | INHALATION_SPRAY | RESPIRATORY_TRACT | Status: DC | PRN
  Administered 2012-08-02: 2 via RESPIRATORY_TRACT
  Filled 2012-08-02: qty 6.7

## 2012-08-02 MED ORDER — IPRATROPIUM BROMIDE 0.02 % IN SOLN
0.5000 mg | Freq: Once | RESPIRATORY_TRACT | Status: AC
  Administered 2012-08-02: 0.5 mg via RESPIRATORY_TRACT
  Filled 2012-08-02: qty 2.5

## 2012-08-02 MED ORDER — SODIUM CHLORIDE 0.9 % IV BOLUS (SEPSIS)
1000.0000 mL | Freq: Once | INTRAVENOUS | Status: AC
  Administered 2012-08-02: 1000 mL via INTRAVENOUS

## 2012-08-02 MED ORDER — KETOROLAC TROMETHAMINE 30 MG/ML IJ SOLN
30.0000 mg | Freq: Once | INTRAMUSCULAR | Status: AC
  Administered 2012-08-02: 30 mg via INTRAVENOUS
  Filled 2012-08-02: qty 1

## 2012-08-02 MED ORDER — NAPROXEN 500 MG PO TABS: 500.0000 mg | ORAL_TABLET | Freq: Two times a day (BID) | ORAL | Status: DC

## 2012-08-02 NOTE — ED Notes (Signed)
States he woke this am with severe lower back pain. Denies any injury or history of back pain. Denies any bowel or bladder changes. Also states his asthma is worse because of the weather. Pt is able to speak in full unlabored sentences in triage

## 2012-08-02 NOTE — ED Provider Notes (Signed)
Medical screening examination/treatment/procedure(s) were conducted as a shared visit with non-physician practitioner(s) and myself.  I personally evaluated the patient during the encounter.   No bowel or blader incontinence.    Donnetta Hutching, MD 08/02/12 1425

## 2012-08-02 NOTE — ED Provider Notes (Signed)
History     CSN: [id number]  Arrival date & time 08/02/12  1118   First MD Initiated Contact with Patient 08/02/12 1129      Chief Complaint  Patient presents with  . Back Pain    (Consider location/radiation/quality/duration/timing/severity/associated sxs/prior treatment) HPI  32 year old male with history of asthma presents complaining of back pain. Patient reports he was awoke this morning with sharp stabbing pain to his low back. Pain is severe, 10 out of 10, persistent, worsening with movement. He felt nauseated and vomited twice with nonbloody nonbilious vomit. He endorsed chills. He reports increased shortness of breath as well, has a history of asthma. Patient denies any strenuous exercise or any recent injury. He denies fever, chest pain, abdominal pain, dysuria, hernia, hematuria, or rash. He denies any numbness or weakness. No specific treatment tried.  Past Medical History  Diagnosis Date  . Asthma   . ADHD (attention deficit hyperactivity disorder)     Past Surgical History  Procedure Laterality Date  . Tympanoplasty      Family History  Problem Relation Age of Onset  . Asthma Mother   . Heart failure Mother   . Hyperlipidemia Mother   . Hypertension Mother   . Migraines Mother   . Coronary artery disease Father   . Heart attack Father     History  Substance Use Topics  . Smoking status: Never Smoker   . Smokeless tobacco: Not on file  . Alcohol Use: No     Comment: occasionally      Review of Systems  Constitutional:       A complete 10 system review of systems was obtained and all systems are negative except as noted in the HPI and PMH.    Allergies  Amoxicillin-pot clavulanate and Other  Home Medications   Current Outpatient Rx  Name  Route  Sig  Dispense  Refill  . albuterol (PROVENTIL) (2.5 MG/3ML) 0.083% nebulizer solution   Nebulization   Take 2.5 mg by nebulization every 6 (six) hours as needed. For shortness of breath.            BP 106/76  Pulse 117  Temp(Src) 99.1 F (37.3 C) (Oral)  Resp 20  SpO2 96%  Physical Exam  Nursing note and vitals reviewed. Constitutional: He is oriented to person, place, and time. He appears well-developed and well-nourished. He appears distressed (Appears uncomfortable, writhing in bed).  HENT:  Head: Normocephalic and atraumatic.  Eyes: Conjunctivae are normal.  Neck: Neck supple.  Cardiovascular:  Tachycardia without murmurs, rubs, or gallops noted  Pulmonary/Chest: Effort normal. He has wheezes (Scatter wheezes heard, no rales or rhonchi).  Abdominal: Soft. There is no tenderness.  No abdominal hernia noted  Genitourinary:  No obvious CVA tenderness  Musculoskeletal: He exhibits tenderness (Generalized tenderness to paralumbar region on palpation with increasing pain from leg range of motion. Patellar deep tendon reflex 2+, no foot drop. Pedal pulse palpable with brisk cap refill).  Neurological: He is alert and oriented to person, place, and time.  Skin: Skin is warm. No rash noted.  Psychiatric: He has a normal mood and affect.    ED Course  Procedures (including critical care time)  11:55 AM Patient with acute onset of low back pain, worsening with movement. No recent trauma to suggest acute fracture or dislocation. No obvious CVA tenderness to suggest kidney stone. Has history of asthma and exhibits mild scatter wheezes. Breathing treatment started. Pain medication and antinausea medication given. Will check UA.  1:36 PM Patient felt much better after receiving treatment. His pain is currently at 2. He is able to tolerates by mouth. UA shows no evidence of blood or infection. At this time I think patient is stable for discharge. Return precautions discussed. Will give patient albuterol inhaler to as a rescue inhaler to use as needed. Resource given.  Labs Reviewed  URINALYSIS, ROUTINE W REFLEX MICROSCOPIC - Abnormal; Notable for the following:    APPearance  CLOUDY (*)    Ketones, ur 40 (*)    All other components within normal limits   No results found.   1. Low back pain       MDM  BP 106/76  Pulse 117  Temp(Src) 99.1 F (37.3 C) (Oral)  Resp 20  SpO2 96%  I have reviewed nursing notes and vital signs.  I reviewed available ER/hospitalization records thought the EMR         Fayrene Helper, [place] 08/02/12 1342

## 2012-08-02 NOTE — ED Notes (Signed)
Pt brought back from triage; pt undressed, in gown

## 2012-08-02 NOTE — ED Notes (Addendum)
Pt c/o sharp lower back pain and n/v that started this morning when he woke up. Pt states he has no hx of back problems. Rates pain 10/10. Pt is visibly upset, in tears, and anxious.

## 2012-08-02 NOTE — ED Notes (Signed)
Pt made aware of need of urine specimen; urinal handed to pt to use when he can 

## 2012-09-25 ENCOUNTER — Encounter (HOSPITAL_COMMUNITY): Payer: Self-pay | Admitting: Emergency Medicine

## 2012-09-25 ENCOUNTER — Emergency Department (HOSPITAL_COMMUNITY)
Admission: EM | Admit: 2012-09-25 | Discharge: 2012-09-26 | Disposition: A | Discharge status: Home / Self Care | Payer: Self-pay | Attending: Emergency Medicine | Admitting: Emergency Medicine

## 2012-09-25 DIAGNOSIS — Z8659 Personal history of other mental and behavioral disorders: Secondary | ICD-10-CM | POA: Insufficient documentation

## 2012-09-25 DIAGNOSIS — J45909 Unspecified asthma, uncomplicated: Secondary | ICD-10-CM | POA: Insufficient documentation

## 2012-09-25 DIAGNOSIS — J029 Acute pharyngitis, unspecified: Secondary | ICD-10-CM | POA: Insufficient documentation

## 2012-09-25 DIAGNOSIS — R1310 Dysphagia, unspecified: Secondary | ICD-10-CM | POA: Insufficient documentation

## 2012-09-25 DIAGNOSIS — Z79899 Other long term (current) drug therapy: Secondary | ICD-10-CM | POA: Insufficient documentation

## 2012-09-25 DIAGNOSIS — H9209 Otalgia, unspecified ear: Secondary | ICD-10-CM | POA: Insufficient documentation

## 2012-09-25 NOTE — ED Notes (Signed)
PT. REPORTS LEFT SORE THROAT / SWELLING FOR SEVERAL DAYS " HARD TO SWALLOW' , RESPIRATIONS UNLABORED / AIRWAY INTACT .

## 2012-09-26 MED ORDER — HYDROCODONE-ACETAMINOPHEN 7.5-325 MG/15ML PO SOLN: 10.0000 mL | Freq: Once | ORAL | Status: DC

## 2012-09-26 MED ORDER — HYDROCODONE-ACETAMINOPHEN 7.5-325 MG/15ML PO SOLN
10.0000 mL | Freq: Once | ORAL | Status: AC
  Administered 2012-09-26: 10 mL via ORAL
  Filled 2012-09-26: qty 15

## 2012-09-26 NOTE — ED Provider Notes (Signed)
History     CSN: [id number]  Arrival date & time 09/25/12  2225   First MD Initiated Contact with Patient 09/25/12 2310      Chief Complaint  Patient presents with  . Sore Throat    (Consider location/radiation/quality/duration/timing/severity/associated sxs/prior treatment) HPI Comments: Is a 32 year old, gentleman who's had a sore throat, with difficulty swallowing for several, days.  Denies any fever or chills.  No rhinitis.  Pain is now radiating to his left ear is been taking over-the-counter medication without any relief.  Patient is a 32 y.o. male presenting with pharyngitis. The history is provided by the patient.  Sore Throat This is a new problem. The current episode started in the past 7 days. The problem occurs constantly. The problem has been gradually worsening. Associated symptoms include a sore throat. Pertinent negatives include no abdominal pain, chills, congestion, fever, headaches, nausea, rash or vomiting. The symptoms are aggravated by swallowing. He has tried acetaminophen and NSAIDs for the symptoms. The treatment provided no relief.    Past Medical History  Diagnosis Date  . Asthma   . ADHD (attention deficit hyperactivity disorder)     Past Surgical History  Procedure Laterality Date  . Tympanoplasty      Family History  Problem Relation Age of Onset  . Asthma Mother   . Heart failure Mother   . Hyperlipidemia Mother   . Hypertension Mother   . Migraines Mother   . Coronary artery disease Father   . Heart attack Father     History  Substance Use Topics  . Smoking status: Never Smoker   . Smokeless tobacco: Not on file  . Alcohol Use: No     Comment: occasionally      Review of Systems  Constitutional: Negative for fever and chills.  HENT: Positive for ear pain, sore throat and trouble swallowing. Negative for congestion, rhinorrhea, drooling and mouth sores.   Gastrointestinal: Negative for nausea, vomiting and abdominal pain.  Skin:  Negative for pallor and rash.  Neurological: Negative for dizziness and headaches.  All other systems reviewed and are negative.    Allergies  Amoxicillin-pot clavulanate and Other  Home Medications   Current Outpatient Rx  Name  Route  Sig  Dispense  Refill  . albuterol (PROVENTIL) (2.5 MG/3ML) 0.083% nebulizer solution   Nebulization   Take 2.5 mg by nebulization daily as needed for wheezing or shortness of breath.         Marland Kitchen ibuprofen (ADVIL,MOTRIN) 200 MG tablet   Oral   Take 400 mg by mouth every 4 (four) hours as needed for pain.         Marland Kitchen HYDROcodone-acetaminophen (HYCET) 7.5-325 mg/15 ml solution   Oral   Take 10 mLs by mouth once.   120 mL   0     BP 128/74  Pulse 97  Temp(Src) 97.8 F (36.6 C) (Oral)  Resp 18  SpO2 100%  Physical Exam  Nursing note and vitals reviewed. Constitutional: He appears well-developed and well-nourished.  HENT:  Head: Normocephalic and atraumatic. No trismus in the jaw.  Right Ear: External ear normal.  Left Ear: External ear normal.  Mouth/Throat: Uvula is midline and mucous membranes are normal. Posterior oropharyngeal erythema present. No oropharyngeal exudate.  Eyes: Pupils are equal, round, and reactive to light.  Cardiovascular: Normal rate.   Pulmonary/Chest: Effort normal.  Musculoskeletal: Normal range of motion.  Lymphadenopathy:    He has cervical adenopathy.       Left cervical:  Superficial cervical adenopathy present.  Neurological: He is alert.  Skin: Skin is warm and dry.    ED Course  Procedures (including critical care time)  Labs Reviewed  RAPID STREP SCREEN  CULTURE, GROUP A STREP   No results found.   1. Viral pharyngitis       MDM   Rapid strep is negative, and leukocyte given, the patient's pain control, with instructions to follow up with Dr. Pollyann Kennedy.  ENT if he has persistent pain, past the next 2-3, days        Arman Filter, NP 09/26/12 0111

## 2012-09-27 LAB — CULTURE, GROUP A STREP

## 2012-09-27 NOTE — ED Provider Notes (Signed)
Medical screening examination/treatment/procedure(s) were performed by non-physician practitioner and as supervising physician I was immediately available for consultation/collaboration.   Eisa Conaway W Elzie Sheets, MD 09/27/12 0521 

## 2012-09-28 NOTE — ED Notes (Signed)
Post ED Visit - Positive Culture Follow-up  Culture report reviewed by antimicrobial stewardship pharmacist: []  Wes Dulaney, Pharm.D., BCPS [x]  Celedonio Miyamoto, Pharm.D., BCPS []  Georgina Pillion, Pharm.D., BCPS []  [place], [place].D., BCPS, AAHIVP []  Estella Husk, Pharm.D., BCPS, AAHIVP  Positive group A strep culture  no further patient follow-up is required at this time.  Larena Sox 09/28/2012, 4:26 PM

## 2012-09-29 ENCOUNTER — Encounter (HOSPITAL_COMMUNITY): Payer: Self-pay | Admitting: Emergency Medicine

## 2012-09-29 ENCOUNTER — Emergency Department (HOSPITAL_COMMUNITY)
Admission: EM | Admit: 2012-09-29 | Discharge: 2012-09-29 | Disposition: A | Discharge status: Home / Self Care | Payer: Self-pay | Attending: Emergency Medicine | Admitting: Emergency Medicine

## 2012-09-29 DIAGNOSIS — Z8659 Personal history of other mental and behavioral disorders: Secondary | ICD-10-CM | POA: Insufficient documentation

## 2012-09-29 DIAGNOSIS — J45909 Unspecified asthma, uncomplicated: Secondary | ICD-10-CM | POA: Insufficient documentation

## 2012-09-29 DIAGNOSIS — R1310 Dysphagia, unspecified: Secondary | ICD-10-CM | POA: Insufficient documentation

## 2012-09-29 DIAGNOSIS — Z79899 Other long term (current) drug therapy: Secondary | ICD-10-CM | POA: Insufficient documentation

## 2012-09-29 DIAGNOSIS — R6883 Chills (without fever): Secondary | ICD-10-CM | POA: Insufficient documentation

## 2012-09-29 DIAGNOSIS — R0602 Shortness of breath: Secondary | ICD-10-CM | POA: Insufficient documentation

## 2012-09-29 DIAGNOSIS — J020 Streptococcal pharyngitis: Secondary | ICD-10-CM | POA: Insufficient documentation

## 2012-09-29 DIAGNOSIS — R110 Nausea: Secondary | ICD-10-CM | POA: Insufficient documentation

## 2012-09-29 MED ORDER — HYDROCODONE-ACETAMINOPHEN 7.5-325 MG/15ML PO SOLN: 15.0000 mL | Freq: Three times a day (TID) | ORAL | Status: DC | PRN

## 2012-09-29 MED ORDER — ONDANSETRON 4 MG PO TBDP
4.0000 mg | ORAL_TABLET | Freq: Once | ORAL | Status: AC
  Administered 2012-09-29: 4 mg via ORAL
  Filled 2012-09-29: qty 1

## 2012-09-29 MED ORDER — AMOXICILLIN 250 MG/5ML PO SUSR
500.0000 mg | Freq: Three times a day (TID) | ORAL | Status: DC
  Administered 2012-09-29: 500 mg via ORAL
  Filled 2012-09-29 (×2): qty 10

## 2012-09-29 MED ORDER — DEXAMETHASONE SODIUM PHOSPHATE 10 MG/ML IJ SOLN
10.0000 mg | Freq: Once | INTRAMUSCULAR | Status: AC
  Administered 2012-09-29: 10 mg via INTRAMUSCULAR
  Filled 2012-09-29: qty 1

## 2012-09-29 MED ORDER — HYDROCODONE-ACETAMINOPHEN 7.5-325 MG/15ML PO SOLN
10.0000 mL | Freq: Once | ORAL | Status: AC
  Administered 2012-09-29: 10 mL via ORAL
  Filled 2012-09-29: qty 15

## 2012-09-29 MED ORDER — AMOXICILLIN 250 MG/5ML PO SUSR: 500.0000 mg | Freq: Three times a day (TID) | ORAL | Status: DC

## 2012-09-29 NOTE — ED Provider Notes (Signed)
History    This chart was scribed for Devin [name] (PA) non-physician practitioner working with Gwyneth Sprout, MD by Sofie Rower, ED Scribe. This patient was seen in room TR04C/TR04C and the patient's care was started at 6:38PM.   CSN: [id number]  Arrival date & time 09/29/12  1647   First MD Initiated Contact with Patient 09/29/12 1838      Chief Complaint  Patient presents with  . Sore Throat    (Consider location/radiation/quality/duration/timing/severity/associated sxs/prior treatment) The history is provided by the patient. No language interpreter was used.    Devin [name] is a 32 y.o. male , with a hx of viral pharyngitis (Pt received evaluation at [facility] on 09/25/12), asthma, ADHD, and tympanoplasty, who presents to the Emergency Department complaining of gradual, progressively worsening, sore throat, onset two weeks ago.  Associated symptoms include chills, difficulty swallowing and difficulty breathing. The pt reports he was evaluated within [facility] two days ago, where which he was given a prescription for pain management, however, he was not administered antibiotics. The progressively worsening sore throat, along with experiencing difficulty swallowing and difficulty sleeping, have prompted the pt's concern and desire to seek medical evaluation at [facility] this evening (09/29/12).   The pt denies experiencing any fever.   The pt does not smoke, however, he does drink alcohol occasionally.   PCP is Dr. Quintella Reichert.    Past Medical History  Diagnosis Date  . Asthma   . ADHD (attention deficit hyperactivity disorder)     Past Surgical History  Procedure Laterality Date  . Tympanoplasty      Family History  Problem Relation Age of Onset  . Asthma Mother   . Heart failure Mother   . Hyperlipidemia Mother   . Hypertension Mother   . Migraines Mother   . Coronary artery disease Father   . Heart attack Father     History  Substance Use Topics  . Smoking status: Never Smoker   .  Smokeless tobacco: Not on file  . Alcohol Use: No     Comment: occasionally      Review of Systems  Constitutional: Positive for chills. Negative for fever.  HENT: Positive for sore throat and trouble swallowing.   Respiratory: Positive for shortness of breath.   Gastrointestinal: Positive for nausea. Negative for vomiting.  All other systems reviewed and are negative.    Allergies  Amoxicillin-pot clavulanate and Other  Home Medications   Current Outpatient Rx  Name  Route  Sig  Dispense  Refill  . albuterol (PROVENTIL) (2.5 MG/3ML) 0.083% nebulizer solution   Nebulization   Take 2.5 mg by nebulization daily as needed for wheezing or shortness of breath.         Marland Kitchen ibuprofen (ADVIL,MOTRIN) 200 MG tablet   Oral   Take 400 mg by mouth every 4 (four) hours as needed for pain.         Marland Kitchen amoxicillin (AMOXIL) 250 MG/5ML suspension   Oral   Take 10 mLs (500 mg total) by mouth 3 (three) times daily.   300 mL   0   . HYDROcodone-acetaminophen (HYCET) 7.5-325 mg/15 ml solution   Oral   Take 15 mLs by mouth every 8 (eight) hours as needed for pain.   120 mL   0     BP 136/92  Pulse 95  Temp(Src) 98.1 F (36.7 C) (Oral)  Resp 18  SpO2 95%  Physical Exam  Nursing note and vitals reviewed. Constitutional: He is oriented to person, place,  and time. He appears well-developed and well-nourished. No distress.  Pt appears uncomfortable. Pt is shivering within a blanket. Pt is able to speak full sentences. No respiratory distress.   HENT:  Head: Normocephalic and atraumatic. No trismus in the jaw.  Right Ear: Hearing, tympanic membrane, external ear and ear canal normal.  Left Ear: Hearing, tympanic membrane, external ear and ear canal normal.  Nose: Nose normal.  Mouth/Throat: Uvula is midline and mucous membranes are normal. He does not have dentures. No oral lesions. Normal dentition. No dental abscesses, edematous, lacerations or dental caries. Oropharyngeal exudate,  posterior oropharyngeal edema and posterior oropharyngeal erythema present. No tonsillar abscesses.    Eyes: EOM are normal.  Neck: Neck supple. No tracheal deviation present.  Cardiovascular: Normal rate, regular rhythm and normal heart sounds.  Exam reveals no gallop and no friction rub.   No murmur heard. Pulmonary/Chest: Effort normal and breath sounds normal. No respiratory distress. He has no wheezes.  Musculoskeletal: Normal range of motion.  Neurological: He is alert and oriented to person, place, and time.  Skin: Skin is warm and dry.  Psychiatric: He has a normal mood and affect. His behavior is normal.    ED Course  Procedures (including critical care time)  DIAGNOSTIC STUDIES: Oxygen Saturation is 95% on room air, normal by my interpretation.    COORDINATION OF CARE:   6:56 PM- Treatment plan concerning conference with attending physician (Dr. Gwyneth Sprout) discussed with patient. Pt agrees with treatment.     Labs Reviewed - No data to display No results found.   1. Strep pharyngitis       MDM  Concern for paratonsilar abscess  Discussed pt with Dr. Anitra Lauth who agreed to see pt. Does not believe there is an abscess present at this time.  Will tx as outpatient.  No need for imaging at this time. Tx in ED: lortab liquid, decadron, and 1st dose of liquid amoxacillin.  Pt states he is allergic to augmentin (breaks out in hives) but has had amoxacillin in past w/o problems.   Rx: amoxacillin and lortab.  F/u with Dr. Quintella Reichert in 2-3 days if not improving. Return to ED if unable to swallow liquids or have difficulty breathing. Pt verbalized understanding and agreement with tx plan Vitals: unremarkable. Discharged in stable condition.    Discussed pt with attending during ED encounter.    I personally performed the services described in this documentation, which was scribed in my presence. The recorded information has been reviewed and is accurate.     Devin Finner, PA-C 09/29/12 2018

## 2012-09-29 NOTE — ED Provider Notes (Signed)
Medical screening examination/treatment/procedure(s) were conducted as a shared visit with non-physician practitioner(s) and myself.  I personally evaluated the patient during the encounter Patient was strap and necrotic left tonsil however no findings suggestive of peritonsillar abscess at this time. No uvular deviation or peritonsillar swelling. Patient will be started on antibiotics, pain control and Decadron. To return if symptoms worsen  Gwyneth Sprout, MD 09/30/12 0000

## 2012-09-29 NOTE — ED Notes (Signed)
Pt c/o continued sore throat; pt sts seen here for same; pt noted to have white patches to throat; per culture pt group A strep +

## 2012-09-29 NOTE — ED Notes (Signed)
Warm blankets and ice chips given

## 2012-10-06 ENCOUNTER — Emergency Department (HOSPITAL_COMMUNITY)
Admission: EM | Admit: 2012-10-06 | Discharge: 2012-10-06 | Disposition: A | Discharge status: Home / Self Care | Payer: Self-pay | Attending: Emergency Medicine | Admitting: Emergency Medicine

## 2012-10-06 ENCOUNTER — Emergency Department (HOSPITAL_COMMUNITY): Payer: Self-pay

## 2012-10-06 ENCOUNTER — Encounter (HOSPITAL_COMMUNITY): Payer: Self-pay | Admitting: Emergency Medicine

## 2012-10-06 DIAGNOSIS — R062 Wheezing: Secondary | ICD-10-CM | POA: Insufficient documentation

## 2012-10-06 DIAGNOSIS — Z8659 Personal history of other mental and behavioral disorders: Secondary | ICD-10-CM | POA: Insufficient documentation

## 2012-10-06 DIAGNOSIS — J45901 Unspecified asthma with (acute) exacerbation: Secondary | ICD-10-CM | POA: Insufficient documentation

## 2012-10-06 MED ORDER — ALBUTEROL SULFATE (2.5 MG/3ML) 0.083% IN NEBU: 2.5000 mg | INHALATION_SOLUTION | RESPIRATORY_TRACT | Status: DC | PRN

## 2012-10-06 MED ORDER — ALBUTEROL (5 MG/ML) CONTINUOUS INHALATION SOLN
10.0000 mg/h | INHALATION_SOLUTION | RESPIRATORY_TRACT | Status: AC
  Administered 2012-10-06: 10 mg/h via RESPIRATORY_TRACT
  Filled 2012-10-06: qty 20

## 2012-10-06 MED ORDER — ALBUTEROL SULFATE HFA 108 (90 BASE) MCG/ACT IN AERS
2.0000 | INHALATION_SPRAY | Freq: Four times a day (QID) | RESPIRATORY_TRACT | Status: DC
  Administered 2012-10-06: 2 via RESPIRATORY_TRACT
  Filled 2012-10-06: qty 6.7

## 2012-10-06 MED ORDER — PREDNISONE 50 MG PO TABS: 50.0000 mg | ORAL_TABLET | Freq: Every day | ORAL | Status: DC

## 2012-10-06 MED ORDER — PREDNISONE 20 MG PO TABS
60.0000 mg | ORAL_TABLET | Freq: Once | ORAL | Status: AC
  Administered 2012-10-06: 60 mg via ORAL
  Filled 2012-10-06: qty 3

## 2012-10-06 NOTE — ED Notes (Addendum)
Pt recently had strep throat and just finished his course of antibiotics a few days ago. Pt states he was walking to the store to get diapers for his kids and became short of breath. Pt states he has to use his neb tx a few times a week. Pt states he feels a little better after first neb tx. Pt also has a cough and states he is coughing up yellow sputum.

## 2012-10-06 NOTE — ED Provider Notes (Signed)
History     CSN: [id number]  Arrival date & time 10/06/12  1621   First MD Initiated Contact with Patient 10/06/12 1623      Chief Complaint  Patient presents with  . Shortness of Breath    (Consider location/radiation/quality/duration/timing/severity/associated sxs/prior treatment) HPI Patient presents emergency Department with onset of this wheezing and shortness of breath, just prior to arrival.  Patient, states, that he has a history of asthma and has had multiple flares.  Patient, states he was recently seen in the emergency department.  Patient, states he was treated for strep pharyngitis.  Patient, states he finished antibiotics 2 days, ago.  He had a productive cough.  Patient denies chest pain, nausea, vomiting, abdominal pain, diarrhea, dizziness, weakness, syncope, fever, rash, blurred vision, or headache.  Patient, states, that he ran out of his albuterol. Past Medical History  Diagnosis Date  . Asthma   . ADHD (attention deficit hyperactivity disorder)     Past Surgical History  Procedure Laterality Date  . Tympanoplasty      Family History  Problem Relation Age of Onset  . Asthma Mother   . Heart failure Mother   . Hyperlipidemia Mother   . Hypertension Mother   . Migraines Mother   . Coronary artery disease Father   . Heart attack Father     History  Substance Use Topics  . Smoking status: Never Smoker   . Smokeless tobacco: Not on file  . Alcohol Use: No     Comment: occasionally      Review of Systems All other systems negative except as documented in the HPI. All pertinent positives and negatives as reviewed in the HPI.  Allergies  Amoxicillin-pot clavulanate and Other  Home Medications   Current Outpatient Rx  Name  Route  Sig  Dispense  Refill  . albuterol (PROVENTIL) (2.5 MG/3ML) 0.083% nebulizer solution   Nebulization   Take 2.5 mg by nebulization every 4 (four) hours as needed for wheezing or shortness of breath. For  wheezing/shortness of breath         . amoxicillin (AMOXIL) 250 MG/5ML suspension   Oral   Take 10 mLs (500 mg total) by mouth 3 (three) times daily.   300 mL   0   . ibuprofen (ADVIL,MOTRIN) 200 MG tablet   Oral   Take 400 mg by mouth every 4 (four) hours as needed for pain. For pain           BP 113/65  Pulse 81  Resp 18  SpO2 96%  Physical Exam  Nursing note and vitals reviewed. Constitutional: He is oriented to person, place, and time. He appears well-developed and well-nourished. No distress.  HENT:  Head: Normocephalic and atraumatic.  Mouth/Throat: Oropharynx is clear and moist.  Eyes: Pupils are equal, round, and reactive to light.  Neck: Normal range of motion. Neck supple.  Cardiovascular: Normal rate, regular rhythm and normal heart sounds.  Exam reveals no gallop and no friction rub.   No murmur heard. Pulmonary/Chest: Effort normal. He has wheezes. He has no rales.  Abdominal: Soft. Bowel sounds are normal. He exhibits no distension.  Neurological: He is alert and oriented to person, place, and time. He exhibits normal muscle tone. Coordination normal.  Skin: Skin is warm and dry. No rash noted.    ED Course  Procedures (including critical care time)  Labs Reviewed - No data to display Dg Chest 2 View  10/06/2012   *RADIOLOGY REPORT*  Clinical Data:  Short of breath  CHEST - 2 VIEW  Comparison: 11/19/2011  Findings: Normal mediastinum and cardiac silhouette.  Normal pulmonary  vasculature.  No evidence of effusion, infiltrate, or pneumothorax.  No acute bony abnormality.  IMPRESSION: No acute cardiopulmonary process.   Original Report Authenticated By: Genevive Bi, M.D.    6:00 PM recheck of the patient and he has no further wheezing is finishing his hour-long treatment.  Patient is advised of the plan to discharge him once he is finished with his treatment.  Patient has maintained normal pulse oximetry   MDM          Carlyle Dolly,  PA-C 10/06/12 1829

## 2012-10-06 NOTE — ED Provider Notes (Signed)
Medical screening examination/treatment/procedure(s) were performed by non-physician practitioner and as supervising physician I was immediately available for consultation/collaboration.   Itzayanna Kaster, MD 10/06/12 2333 

## 2012-10-06 NOTE — ED Notes (Signed)
Pt has hx of asthma, pt has not had medications  X 3days. Pt went for a walk and had a asthma flare up. Pt has wheezing in upper lobes, tightness in lower lobes. Alert and oriented. Pt was given a neb tx 5mg  albuterol, 0.5mg  atrovent and pt states he feels better. Vitals 130/80, 88 p, resp 18, 99% RA.

## 2012-10-23 ENCOUNTER — Encounter (HOSPITAL_COMMUNITY): Payer: Self-pay | Admitting: Emergency Medicine

## 2012-10-23 ENCOUNTER — Emergency Department (HOSPITAL_COMMUNITY)
Admission: EM | Admit: 2012-10-23 | Discharge: 2012-10-23 | Disposition: A | Discharge status: Home / Self Care | Payer: Self-pay | Attending: Emergency Medicine | Admitting: Emergency Medicine

## 2012-10-23 DIAGNOSIS — Z8659 Personal history of other mental and behavioral disorders: Secondary | ICD-10-CM | POA: Insufficient documentation

## 2012-10-23 DIAGNOSIS — J45901 Unspecified asthma with (acute) exacerbation: Secondary | ICD-10-CM | POA: Insufficient documentation

## 2012-10-23 DIAGNOSIS — Z79899 Other long term (current) drug therapy: Secondary | ICD-10-CM | POA: Insufficient documentation

## 2012-10-23 MED ORDER — ALBUTEROL SULFATE (2.5 MG/3ML) 0.083% IN NEBU: 2.5000 mg | INHALATION_SOLUTION | Freq: Four times a day (QID) | RESPIRATORY_TRACT | Status: DC | PRN

## 2012-10-23 MED ORDER — ALBUTEROL SULFATE HFA 108 (90 BASE) MCG/ACT IN AERS
2.0000 | INHALATION_SPRAY | Freq: Four times a day (QID) | RESPIRATORY_TRACT | Status: DC | PRN
  Administered 2012-10-23: 2 via RESPIRATORY_TRACT
  Filled 2012-10-23: qty 6.7

## 2012-10-23 NOTE — ED Provider Notes (Signed)
History    CSN: [id number] Arrival date & time 10/23/12  4098  First MD Initiated Contact with Patient 10/23/12 1117     Chief Complaint  Patient presents with  . Shortness of Breath   (Consider location/radiation/quality/duration/timing/severity/associated sxs/prior Treatment) Patient is a 32 y.o. male presenting with shortness of breath. The history is provided by the patient and the EMS personnel.  Shortness of Breath Associated symptoms: wheezing   Associated symptoms: no abdominal pain, no fever, no headaches and no rash    Patient brought in by EMS. Patient with history of asthma ran out of his nebulizer solution last night woke up this morning very short of breath with wheezing. EMS provided albuterol nebulizer patient feeling much better. Wheezing has resolved. Patient currently does not have a primary care Dr. Patient has no other complaints.   Past Medical History  Diagnosis Date  . Asthma   . ADHD (attention deficit hyperactivity disorder)    Past Surgical History  Procedure Laterality Date  . Tympanoplasty     Family History  Problem Relation Age of Onset  . Asthma Mother   . Heart failure Mother   . Hyperlipidemia Mother   . Hypertension Mother   . Migraines Mother   . Coronary artery disease Father   . Heart attack Father    History  Substance Use Topics  . Smoking status: Never Smoker   . Smokeless tobacco: Not on file  . Alcohol Use: No     Comment: occasionally    Review of Systems  Constitutional: Negative for fever.  HENT: Negative for congestion.   Eyes: Negative for redness.  Respiratory: Positive for shortness of breath and wheezing.   Gastrointestinal: Negative for abdominal pain.  Musculoskeletal: Negative for myalgias.  Skin: Negative for rash.  Neurological: Negative for headaches.  Hematological: Does not bruise/bleed easily.  Psychiatric/Behavioral: Negative for confusion.    Allergies  Amoxicillin-pot clavulanate and  Other  Home Medications   Current Outpatient Rx  Name  Route  Sig  Dispense  Refill  . albuterol (PROVENTIL) (2.5 MG/3ML) 0.083% nebulizer solution   Nebulization   Take 3 mLs (2.5 mg total) by nebulization every 4 (four) hours as needed for wheezing or shortness of breath. For wheezing/shortness of breath   75 mL   1   . albuterol (PROVENTIL) (2.5 MG/3ML) 0.083% nebulizer solution   Nebulization   Take 3 mLs (2.5 mg total) by nebulization every 6 (six) hours as needed for wheezing.   75 mL   12   . amoxicillin (AMOXIL) 250 MG/5ML suspension   Oral   Take 10 mLs (500 mg total) by mouth 3 (three) times daily.   300 mL   0   . ibuprofen (ADVIL,MOTRIN) 200 MG tablet   Oral   Take 400 mg by mouth every 4 (four) hours as needed for pain. For pain         . predniSONE (DELTASONE) 50 MG tablet   Oral   Take 1 tablet (50 mg total) by mouth daily.   5 tablet   0    BP 120/73  Pulse 74  Temp(Src) 97.6 F (36.4 C) (Oral)  Resp 17  SpO2 97% Physical Exam  Nursing note and vitals reviewed. Constitutional: He is oriented to person, place, and time. He appears well-developed and well-nourished. No distress.  HENT:  Head: Normocephalic and atraumatic.  Mouth/Throat: Oropharynx is clear and moist.  Eyes: Conjunctivae and EOM are normal. Pupils are equal, round, and reactive  to light.  Neck: Normal range of motion.  Cardiovascular: Normal rate, regular rhythm and normal heart sounds.   No murmur heard. Pulmonary/Chest: Effort normal and breath sounds normal. No respiratory distress. He has no wheezes.  Abdominal: Soft. Bowel sounds are normal. There is no tenderness.  Neurological: He is alert and oriented to person, place, and time. No cranial nerve deficit. He exhibits normal muscle tone. Coordination normal.    ED Course  Procedures (including critical care time) Labs Reviewed - No data to display No results found. 1. Asthma exacerbation, unspecified asthma severity      MDM   Patient brought in by EMS. Patient albuterol nebulizer given by them. Wheezing has resolved. Patient ran out of his albuterol nebulizer solution. Patient has no history of asthma patient currently stable nontoxic no acute distress.  Shelda Jakes, MD 10/23/12 [phone]

## 2012-10-23 NOTE — ED Notes (Signed)
EMS stated, he has SOB started last night and used his last inhaler last night, and woke up this am and couldn't breath good.  HHN treatment given in route to ED.  Albuterol 5 HHN 10 minutes ago.

## 2012-10-27 ENCOUNTER — Telehealth (HOSPITAL_COMMUNITY): Payer: Self-pay | Admitting: Emergency Medicine

## 2012-10-27 NOTE — ED Notes (Signed)
Pt states unable to find Rx for Albuterol Neb solution shown it was printed on 7/4.  Pt states spoke to someone yesterday who said they would call it in for pt and they didn't.  Rx called to [facility] [phone] and left on VM for albuterol neb as written 7/4

## 2013-01-11 ENCOUNTER — Emergency Department (HOSPITAL_COMMUNITY)
Admission: EM | Admit: 2013-01-11 | Discharge: 2013-01-11 | Disposition: A | Discharge status: Home / Self Care | Payer: Self-pay | Attending: Emergency Medicine | Admitting: Emergency Medicine

## 2013-01-11 ENCOUNTER — Encounter (HOSPITAL_COMMUNITY): Payer: Self-pay | Admitting: Emergency Medicine

## 2013-01-11 DIAGNOSIS — Z8659 Personal history of other mental and behavioral disorders: Secondary | ICD-10-CM | POA: Insufficient documentation

## 2013-01-11 DIAGNOSIS — Z880 Allergy status to penicillin: Secondary | ICD-10-CM | POA: Insufficient documentation

## 2013-01-11 DIAGNOSIS — J45901 Unspecified asthma with (acute) exacerbation: Secondary | ICD-10-CM | POA: Insufficient documentation

## 2013-01-11 MED ORDER — ALBUTEROL SULFATE (5 MG/ML) 0.5% IN NEBU
5.0000 mg | INHALATION_SOLUTION | Freq: Once | RESPIRATORY_TRACT | Status: AC
  Administered 2013-01-11: 5 mg via RESPIRATORY_TRACT
  Filled 2013-01-11: qty 1

## 2013-01-11 MED ORDER — PREDNISONE 20 MG PO TABS
60.0000 mg | ORAL_TABLET | Freq: Once | ORAL | Status: AC
  Administered 2013-01-11: 60 mg via ORAL
  Filled 2013-01-11: qty 3

## 2013-01-11 MED ORDER — PREDNISONE 50 MG PO TABS: 50.0000 mg | ORAL_TABLET | Freq: Every day | ORAL | Status: DC

## 2013-01-11 MED ORDER — IPRATROPIUM BROMIDE 0.02 % IN SOLN
0.5000 mg | Freq: Once | RESPIRATORY_TRACT | Status: AC
  Administered 2013-01-11: 0.5 mg via RESPIRATORY_TRACT
  Filled 2013-01-11: qty 2.5

## 2013-01-11 MED ORDER — ALBUTEROL SULFATE (5 MG/ML) 0.5% IN NEBU: 2.5000 mg | INHALATION_SOLUTION | Freq: Four times a day (QID) | RESPIRATORY_TRACT | Status: DC | PRN

## 2013-01-11 NOTE — ED Notes (Addendum)
PER EMS- pt picked up from home with c/o asthma x1 day.  Pt has hx of asthma, reports home nebulizer broke. PTA given 5mg  albuterol.

## 2013-01-11 NOTE — Discharge Instructions (Signed)
Asthma Attack Prevention HOW CAN ASTHMA BE PREVENTED? Currently, there is no way to prevent asthma from starting. However, you can take steps to control the disease and prevent its symptoms after you have been diagnosed. Learn about your asthma and how to control it. Take an active role to control your asthma by working with your caregiver to create and follow an asthma action plan. An asthma action plan guides you in taking your medicines properly, avoiding factors that make your asthma worse, tracking your level of asthma control, responding to worsening asthma, and seeking emergency care when needed. To track your asthma, keep records of your symptoms, check your peak flow number using a peak flow meter (handheld device that shows how well air moves out of your lungs), and get regular asthma checkups.  Other ways to prevent asthma attacks include:  Use medicines as your caregiver directs.  Identify and avoid things that make your asthma worse (as much as you can).  Keep track of your asthma symptoms and level of control.  Get regular checkups for your asthma.  With your caregiver, write a detailed plan for taking medicines and managing an asthma attack. Then be sure to follow your action plan. Asthma is an ongoing condition that needs regular monitoring and treatment.  Identify and avoid asthma triggers. A number of outdoor allergens and irritants (pollen, mold, cold air, air pollution) can trigger asthma attacks. Find out what causes or makes your asthma worse, and take steps to avoid those triggers.  Monitor your breathing. Learn to recognize warning signs of an attack, such as slight coughing, wheezing or shortness of breath. However, your lung function may already decrease before you notice any signs or symptoms, so regularly measure and record your peak airflow with a home peak flow meter.  Identify and treat attacks early. If you act quickly, you're less likely to have a severe attack.  You will also need less medicine to control your symptoms. When your peak flow measurements decrease and alert you to an upcoming attack, take your medicine as instructed, and immediately stop any activity that may have triggered the attack. If your symptoms do not improve, get medical help.  Pay attention to increasing quick-relief inhaler use. If you find yourself relying on your quick-relief inhaler (such as albuterol), your asthma is not under control. See your caregiver about adjusting your treatment. IDENTIFY AND CONTROL FACTORS THAT MAKE YOUR ASTHMA WORSE A number of common things can set off or make your asthma symptoms worse (asthma triggers). Keep track of your asthma symptoms for several weeks, detailing all the environmental and emotional factors that are linked with your asthma. When you have an asthma attack, go back to your asthma diary to see which factor, or combination of factors, might have contributed to it. Once you know what these factors are, you can take steps to control many of them.  Allergies: If you have allergies and asthma, it is important to take asthma prevention steps at home. Asthma attacks (worsening of asthma symptoms) can be triggered by allergies, which can cause temporary increased inflammation of your airways. Minimizing contact with the substance to which you are allergic will help prevent an asthma attack. Animal Dander:   Some people are allergic to the flakes of skin or dried saliva from animals with fur or feathers. Keep these pets out of your home.  If you can't keep a pet outdoors, keep the pet out of your bedroom and other sleeping areas at all times,  and keep the door closed.  Remove carpets and furniture covered with cloth from your home. If that is not possible, keep the pet away from fabric-covered furniture and carpets. Dust Mites:  Many people with asthma are allergic to dust mites. Dust mites are tiny bugs that are found in every home, in  mattresses, pillows, carpets, fabric-covered furniture, bedcovers, clothes, stuffed toys, fabric, and other fabric-covered items.  Cover your mattress in a special dust-proof cover.  Cover your pillow in a special dust-proof cover, or wash the pillow each week in hot water. Water must be hotter than 130 F to kill dust mites. Cold or warm water used with detergent and bleach can also be effective.  Wash the sheets and blankets on your bed each week in hot water.  Try not to sleep or lie on cloth-covered cushions.  Call ahead when traveling and ask for a smoke-free hotel room. Bring your own bedding and pillows, in case the hotel only supplies feather pillows and down comforters, which may contain dust mites and cause asthma symptoms.  Remove carpets from your bedroom and those laid on concrete, if you can.  Keep stuffed toys out of the bed, or wash the toys weekly in hot water or cooler water with detergent and bleach. Cockroaches:  Many people with asthma are allergic to the droppings and remains of cockroaches.  Keep food and garbage in closed containers. Never leave food out.  Use poison baits, traps, powders, gels, or paste (for example, boric acid).  If a spray is used to kill cockroaches, stay out of the room until the odor goes away. Indoor Mold:  Fix leaky faucets, pipes, or other sources of water that have mold around them.  Clean floors and moldy surfaces with a fungicide or diluted bleach.  Avoid using humidifiers, vaporizers, or swamp coolers. These can spread molds through the air. Pollen and Outdoor Mold:  When pollen or mold spore counts are high, try to keep your windows closed.  Stay indoors with windows closed from late morning to afternoon, if you can. Pollen and some mold spore counts are highest at that time.  Ask your caregiver whether you need to take or increase anti-inflammatory medicine before your allergy season starts. Irritants:   Tobacco smoke is  an irritant. If you smoke, ask your caregiver how you can quit. Ask family members to quit smoking, too. Do not allow smoking in your home or car.  If possible, do not use a wood-burning stove, kerosene heater, or fireplace. Minimize exposure to all sources of smoke, including incense, candles, fires, and fireworks.  Try to stay away from strong odors and sprays, such as perfume, talcum powder, hair spray, and paints.  Decrease humidity in your home and use an indoor air cleaning device. Reduce indoor humidity to below 60 percent. Dehumidifiers or central air conditioners can do this.  Decrease house dust exposure by changing furnace and air cooler filters frequently.  Try to have someone else vacuum for you once or twice a week, if you can. Stay out of rooms while they are being vacuumed and for a short while afterward.  If you vacuum, use a dust mask from a hardware store, a double-layered or microfilter vacuum cleaner bag, or a vacuum cleaner with a HEPA filter.  Sulfites in foods and beverages can be irritants. Do not drink beer or wine, or eat dried fruit, processed potatoes, or shrimp if they cause asthma symptoms.  Cold air can trigger an asthma attack.  Cover your nose and mouth with a scarf on cold or windy days.  Several health conditions can make asthma more difficult to manage, including runny nose, sinus infections, reflux disease, psychological stress, and sleep apnea. Your caregiver will treat these conditions, as well.  Avoid close contact with people who have a cold or the flu, since your asthma symptoms may get worse if you catch the infection from them. Wash your hands thoroughly after touching items that may have been handled by people with a respiratory infection.  Get a flu shot every year to protect against the flu virus, which often makes asthma worse for days or weeks. Also get a pneumonia shot once every 5 10 years. Medicines:  Aspirin and other pain relievers can  cause asthma attacks. Ten percent to 20% of people with asthma have sensitivity to aspirin or a group of pain relievers called non-steroidal anti-inflammatory medicines (NSAIDS), such as ibuprofen and naproxen. These medicines are used to treat pain and reduce fevers. Asthma attacks caused by any of these medicines can be severe and even fatal. These medicines must be avoided in people who have known aspirin sensitive asthma. Products with acetaminophen are considered safe for people who have asthma. It is important that people with aspirin sensitivity read labels of all over-the-counter medicines used to treat pain, colds, coughs, and fever.  Beta blockers and ACE inhibitors are other medicines which you should discuss with your caregiver, in relation to your asthma. ALLERGY SKIN TESTING  Ask your asthma caregiver about allergy skin testing or blood testing (RAST test) to identify the allergens to which you are sensitive. If you are found to have allergies, allergy shots (immunotherapy) for asthma may help prevent future allergies and asthma. With allergy shots, small doses of allergens (substances to which you are allergic) are injected under your skin on a regular schedule. Over a period of time, your body may become used to the allergen and less responsive with asthma symptoms. You can also take measures to minimize your exposure to those allergens. EXERCISE  If you have exercise-induced asthma, or are planning vigorous exercise, or exercise in cold, humid, or dry environments, prevent exercise-induced asthma by following your caregiver's advice regarding asthma treatment before exercising. Document Released: 03/27/2009 Document Revised: 03/25/2012 Document Reviewed: 03/27/2009 [facility] Patient Information 2014 [place], [place].  Asthma, Adult Asthma is a condition that affects your lungs. It is characterized by swelling and narrowing of your airways as well as increased mucus production. The narrowing  comes from swelling and muscle spasms inside the airways. When this happens, breathing can be difficult and you can have coughing, wheezing, and shortness of breath. Knowing more about asthma can help you manage it better. Asthma cannot be cured, but medicines and lifestyle changes can help control it. Asthma can be a minor problem for some people but if it is not controlled it can lead to a life-threatening asthma attack. Asthma can change over time. It is important to work with your caregiver to manage your asthma symptoms. CAUSES The exact cause of asthma is unknown. Asthma is believed to be caused by inherited (genetic) and environmental exposures. Swelling and redness (inflammation) of the airways occurs in asthma. This can be triggered by allergies, viral lung infections, or irritants in the air. Allergic reactions can cause you to wheeze immediately or several hours after an exposure. Asthma triggers are different for each person. It is important to pay attention and know what triggers your asthma.  Common triggers for asthma  attacks include:  Animal dander from the skin, hair, or feathers of animals.  Dust mites contained in house dust.  Cockroaches.  Pollen from trees or grass.  Mold.  Cigarette or tobacco smoke. Smoking cannot be allowed in homes of people with asthma. People with asthma should not smoke and should not be around smokers.  Air pollutants such as dust, household cleaners, hair sprays, aerosol sprays, paint fumes, strong chemicals, or strong odors.  Cold air or weather changes. Cold air may cause inflammation. Winds increase molds and pollens in the air. There is not one best climate for people with asthma.  Strong emotions such as crying or laughing hard.  Stress.  Certain medicines such as aspirin or beta-blockers.  Sulfites in such foods and drinks as dried fruits and wine.  Infections or inflammatory conditions such as the flu, a cold, or an inflammation of  the nasal membranes (rhinitis).  Gastroesophageal reflux disease (GERD). GERD is a condition where stomach acid backs up into your throat (esophagus).  Exercise or strenous activity. Proper pre-exercise medicines allow most people to participate in sports. SYMPTOMS  Feeling short of breath.  Chest tightness or pain.  Difficulty sleeping due to coughing, wheezing, or feeling short of breath.  A whistling or wheezing sound with exhalation.  Coughing or wheezing that is worse when you:  Have a virus (such as a cold or the flu).  Are suffering from allergies.  Are exposed to certain fumes or chemicals.  Exercise. Signs that your asthma is probably getting worse include:   More frequent and bothersome asthma signs and symptoms.  Increasing difficulty breathing. This can be measured by a peak flow meter, which is a simple device used to check how well your lungs are working.  An increasingly frequent need to use a quick-relief inhaler. DIAGNOSIS  The diagnosis of asthma is made by review of your medical history, a physical exam, and possibly from other tests. Lung function studies may help with the diagnosis. TREATMENT  Asthma cannot be cured. However, for the majority of adults, asthma can be controlled with treatment. Besides avoidance of triggers of your asthma, medicines are often required. There are 2 classes of medicine used for asthma treatment: controller medicines (reduce inflammation and symptoms) andreliever or rescue medicines (relieve asthma symptoms during acute attacks). You may require daily medicines to control your asthma. The most effective long-term controller medicines for asthma are inhaled corticosteroids (blocks inflammation). Other long-term control medicines include:  Leukotriene receptor antagonists (blocks a pathway of inflammation).  Long-acting beta2-agonists (relaxes the muscles of the airways for at least 12 hours) with an inhaled  corticosteroid.  Cromolyn sodium or nedocromil (alters certain inflammatory cells' ability to release chemicals that cause inflammation).  Immunomodulators (alters the immune system to prevent asthma symptoms).  Theophylline (relaxes muscles in the airways). You may also require a short-acting beta2-agonist to relieve asthma symptoms during an acute attack. You should understand what to do during an acute attack. Inhaled medicines are effective when used properly. Read the instructions on how to use your medicines correctly and speak to your caregiver if you have questions. Follow up with your caregiver on a regular basis to make sure your asthma is well-controlled. If your asthma is not well-controlled, if you have been hospitalized for asthma, or if multiple medicines or medium to high doses of inhaled corticosteroids are needed to control your asthma, request a referral to an asthma specialist. HOME CARE INSTRUCTIONS   Take medicines as directed by your  caregiver.  Control your home environment in the following ways to help prevent asthma attacks:  Change your heating and air conditioning filter at least once a month.  Place a filter or cheesecloth over your heating and air conditioning vents.  Limit the use of fireplaces and wood stoves.  Do not smoke. Do not stay in places where others are smoking.  Get rid of pests (such as roaches and mice) and their droppings.  If you see mold on a plant, throw it away.  Clean your floors and dust every week. Use unscented cleaning products. Use a vacuum cleaner with a HEPA filter if possible. If vacuuming or cleaning triggers your asthma, try to find someone else to do these chores.  Floors in your house should be wood, tile, or vinyl. Carpet can trap dander and dust.  Use allergy-proof pillows, mattress covers, and box spring covers.  Wash bedsheets and blankets every week in hot water and dry in a dryer.  Use a blanket that is made of  polyester or cotton with a tight nap.  Do not use a dust ruffle on your bed.  Clean bathrooms and kitchens with bleach and repaint with mold-resistant paint.  Wash hands frequently.  Talk to your caregiver about an action plan for managing asthma attacks. This includes the use of a peak flow meter which measures the severity of the attack and medicines that can help stop the attack. An action plan can help minimize or stop the attack without having to seek medical care.  Remain calm during an asthma attack.  Always have a plan prepared for seeking medical attention. This should include contacting your caregiver and in the case of a severe attack, calling your local emergency services (911 in [place] SEEK MEDICAL CARE IF:   You have wheezing, shortness of breath, or a cough even if taking medicine to prevent attacks.  You have thickening of sputum.  Your sputum changes from clear or white to yellow, green, gray, or bloody.  You have any problems that may be related to the medicines you are taking (such as a rash, itching, swelling, or trouble breathing).  You are using a reliever medicine more than 2 3 times per week.  Your peak flow is still at 50 79% of personal best after following your action plan for 1 hour. SEEK IMMEDIATE MEDICAL CARE IF:   You are short of breath even at rest.  You get short of breath when doing very little physical activity.  You have difficulty eating, drinking, or talking due to asthma symptoms.  You have chest pain or you feel that your heart is beating fast.  You have a bluish color to your lips or fingernails.  You are lightheaded, dizzy, or faint.  You have a fever or persistent symptoms for more than 2 3 days.  You have a fever and symptoms suddenly get worse.  You seem to be getting worse and are unresponsive to treatment during an asthma attack.  Your peak flow is less than 50% of personal best. MAKE SURE YOU:   Understand these  instructions.  Will watch your condition.  Will get help right away if you are not doing well or get worse. Document Released: 04/08/2005 Document Revised: 03/25/2012 Document Reviewed: 11/25/2007 [facility] Patient Information 2014 [place], [place].

## 2013-01-11 NOTE — ED Notes (Signed)
Bed: WA08 Expected date:  Expected time:  Means of arrival:  Comments: ems- asthma

## 2013-01-11 NOTE — ED Provider Notes (Addendum)
CSN: [id number]     Arrival date & time 01/11/13  1746 History   First MD Initiated Contact with Patient 01/11/13 1804     Chief Complaint  Patient presents with  . Asthma   (Consider location/radiation/quality/duration/timing/severity/associated sxs/prior Treatment) HPI Comments: SUBJECTIVE:  Devin [name] is a 32 y.o. male seen urgently with exacerbation of asthma for 2 days. Wheezing is described as moderate. Associated symptoms:cough described as productive of clear sputum. Current asthma medications: using meds compliantly. Patient admits to smoke cigarettes.  OBJECTIVE:  The patient appears alert, well appearing, and in no distress, oriented to person, place, and time and acyanotic, in no respiratory distress. ENT: ENT exam normal, no neck nodes or sinus tenderness CHEST:wheezing noted - mild  ASSESSMENT:  Asthma - acute exacerbation  PLAN:  oximetry on room air was 95%  RX per orders - Use bronchodilator MDI 2 puff q4h prn, steroid MDI regularly to prevent asthma and oral steroid taper.  Additional suggestions to patient: counseled to quit smoking.   Patient is a 32 y.o. male presenting with asthma. The history is provided by the patient and medical records.  Asthma Associated symptoms include shortness of breath. Pertinent negatives include no chest pain and no abdominal pain.    Past Medical History  Diagnosis Date  . Asthma   . ADHD (attention deficit hyperactivity disorder)    Past Surgical History  Procedure Laterality Date  . Tympanoplasty     Family History  Problem Relation Age of Onset  . Asthma Mother   . Heart failure Mother   . Hyperlipidemia Mother   . Hypertension Mother   . Migraines Mother   . Coronary artery disease Father   . Heart attack Father    History  Substance Use Topics  . Smoking status: Never Smoker   . Smokeless tobacco: Not on file  . Alcohol Use: No     Comment: occasionally    Review of Systems  Constitutional: Negative  for activity change and appetite change.  Respiratory: Positive for shortness of breath and wheezing.   Cardiovascular: Negative for chest pain.  Gastrointestinal: Negative for abdominal pain.  Genitourinary: Negative for dysuria.    Allergies  Amoxicillin-pot clavulanate and Other  Home Medications   Current Outpatient Rx  Name  Route  Sig  Dispense  Refill  . albuterol (PROVENTIL) (2.5 MG/3ML) 0.083% nebulizer solution   Nebulization   Take 2.5 mg by nebulization every 4 (four) hours as needed for shortness of breath.          BP 130/62  Pulse 91  Temp(Src) 97.9 F (36.6 C) (Oral)  Resp 18  SpO2 98% Physical Exam  Nursing note and vitals reviewed. Constitutional: He is oriented to person, place, and time.  HENT:  Head: Normocephalic.  Eyes: Conjunctivae are normal.  Neck: Neck supple.  Cardiovascular: Normal rate and regular rhythm.   Pulmonary/Chest: Effort normal. No respiratory distress. He has wheezes.  Abdominal: Soft.  Neurological: He is alert and oriented to person, place, and time.    ED Course  Procedures (including critical care time) Labs Review Labs Reviewed - No data to display Imaging Review No results found.  MDM  No diagnosis found.  Pt comes in with cc of asthma. Cough and wheezing since yday, with few rescue treatments at home. Currently post EMS tx, and appearing well, no resp distress. We will give a treatment here and discharge. No fevers, lung exam benign, no indication for CXR.  Smoking cessation instruction/counseling given:  counseled patient on the dangers of tobacco use, advised patient to stop smoking, and reviewed strategies to maximize success, 2 minutes.   Derwood Kaplan, MD 01/11/13 1850  Derwood Kaplan, MD 01/25/13 [phone]

## 2013-02-13 ENCOUNTER — Telehealth (HOSPITAL_COMMUNITY): Payer: Self-pay | Admitting: Emergency Medicine

## 2013-07-07 ENCOUNTER — Emergency Department (HOSPITAL_COMMUNITY): Payer: Self-pay

## 2013-07-07 ENCOUNTER — Encounter (HOSPITAL_COMMUNITY): Payer: Self-pay | Admitting: Emergency Medicine

## 2013-07-07 ENCOUNTER — Emergency Department (HOSPITAL_COMMUNITY)
Admission: EM | Admit: 2013-07-07 | Discharge: 2013-07-07 | Disposition: A | Discharge status: Home / Self Care | Payer: Self-pay | Attending: Emergency Medicine | Admitting: Emergency Medicine

## 2013-07-07 DIAGNOSIS — J45901 Unspecified asthma with (acute) exacerbation: Secondary | ICD-10-CM | POA: Insufficient documentation

## 2013-07-07 DIAGNOSIS — R112 Nausea with vomiting, unspecified: Secondary | ICD-10-CM | POA: Insufficient documentation

## 2013-07-07 DIAGNOSIS — Z8659 Personal history of other mental and behavioral disorders: Secondary | ICD-10-CM | POA: Insufficient documentation

## 2013-07-07 DIAGNOSIS — Z79899 Other long term (current) drug therapy: Secondary | ICD-10-CM | POA: Insufficient documentation

## 2013-07-07 LAB — BASIC METABOLIC PANEL
BUN: 16 mg/dL (ref 6–23)
CALCIUM: 9.9 mg/dL (ref 8.4–10.5)
CHLORIDE: 99 meq/L (ref 96–112)
CO2: 21 meq/L (ref 19–32)
CREATININE: 0.89 mg/dL (ref 0.50–1.35)
GFR calc Af Amer: >90 mL/min (ref >90)
GFR calc non Af Amer: >90 mL/min (ref >90)
GLUCOSE: 105 mg/dL — AB (ref 70–99)
Potassium: 4.1 mEq/L (ref 3.7–5.3)
Sodium: 139 mEq/L (ref 137–147)

## 2013-07-07 LAB — CBC WITH DIFFERENTIAL/PLATELET
BASOS ABS: 0 10*3/uL (ref 0.0–0.1)
Basophils Relative: 0 % (ref 0–1)
EOS PCT: 0 % (ref 0–5)
Eosinophils Absolute: 0 10*3/uL (ref 0.0–0.7)
HEMATOCRIT: 43.8 % (ref 39.0–52.0)
HEMOGLOBIN: 15.5 g/dL (ref 13.0–17.0)
LYMPHS ABS: 1.2 10*3/uL (ref 0.7–4.0)
LYMPHS PCT: 7 % — AB (ref 12–46)
MCH: 30.9 pg (ref 26.0–34.0)
MCHC: 35.4 g/dL (ref 30.0–36.0)
MCV: 87.3 fL (ref 78.0–100.0)
MONO ABS: 0.2 10*3/uL (ref 0.1–1.0)
Monocytes Relative: 1 % — ABNORMAL LOW (ref 3–12)
NEUTROS ABS: 16.6 10*3/uL — AB (ref 1.7–7.7)
Neutrophils Relative %: 92 % — ABNORMAL HIGH (ref 43–77)
Platelets: 342 10*3/uL (ref 150–400)
RBC: 5.02 MIL/uL (ref 4.22–5.81)
RDW: 13.5 % (ref 11.5–15.5)
WBC: 18 10*3/uL — AB (ref 4.0–10.5)

## 2013-07-07 MED ORDER — ALBUTEROL SULFATE HFA 108 (90 BASE) MCG/ACT IN AERS
2.0000 | INHALATION_SPRAY | RESPIRATORY_TRACT | Status: DC | PRN
  Administered 2013-07-07: 2 via RESPIRATORY_TRACT
  Filled 2013-07-07: qty 6.7

## 2013-07-07 MED ORDER — ALBUTEROL SULFATE (2.5 MG/3ML) 0.083% IN NEBU: 2.5000 mg | INHALATION_SOLUTION | RESPIRATORY_TRACT | Status: DC | PRN

## 2013-07-07 MED ORDER — PREDNISONE 20 MG PO TABS: 60.0000 mg | ORAL_TABLET | Freq: Every day | ORAL | Status: DC

## 2013-07-07 MED ORDER — ALBUTEROL (5 MG/ML) CONTINUOUS INHALATION SOLN
10.0000 mg/h | INHALATION_SOLUTION | Freq: Once | RESPIRATORY_TRACT | Status: AC
  Administered 2013-07-07: 10 mg/h via RESPIRATORY_TRACT
  Filled 2013-07-07: qty 20

## 2013-07-07 NOTE — Discharge Instructions (Signed)
Asthma, Adult Asthma is a recurring condition in which the airways tighten and narrow. Asthma can make it difficult to breathe. It can cause coughing, wheezing, and shortness of breath. Asthma episodes (also called asthma attacks) range from minor to life-threatening. Asthma cannot be cured, but medicines and lifestyle changes can help control it. CAUSES Asthma is believed to be caused by inherited (genetic) and environmental factors, but its exact cause is unknown. Asthma may be triggered by allergens, lung infections, or irritants in the air. Asthma triggers are different for each person. Common triggers include:   Animal dander.  Dust mites.  Cockroaches.  Pollen from trees or grass.  Mold.  Smoke.  Air pollutants such as dust, household cleaners, hair sprays, aerosol sprays, paint fumes, strong chemicals, or strong odors.  Cold air, weather changes, and winds (which increase molds and pollens in the air).  Strong emotional expressions such as crying or laughing hard.  Stress.  Certain medicines (such as aspirin) or types of drugs (such as beta-blockers).  Sulfites in foods and drinks. Foods and drinks that may contain sulfites include dried fruit, potato chips, and sparkling grape juice.  Infections or inflammatory conditions such as the flu, a cold, or an inflammation of the nasal membranes (rhinitis).  Gastroesophageal reflux disease (GERD).  Exercise or strenuous activity. SYMPTOMS Symptoms may occur immediately after asthma is triggered or many hours later. Symptoms include:  Wheezing.  Excessive nighttime or early morning coughing.  Frequent or severe coughing with a common cold.  Chest tightness.  Shortness of breath. DIAGNOSIS  The diagnosis of asthma is made by a review of your medical history and a physical exam. Tests may also be performed. These may include:  Lung function studies. These tests show how much air you breath in and out.  Allergy  tests.  Imaging tests such as X-rays. TREATMENT  Asthma cannot be cured, but it can usually be controlled. Treatment involves identifying and avoiding your asthma triggers. It also involves medicines. There are 2 classes of medicine used for asthma treatment:   Controller medicines. These prevent asthma symptoms from occurring. They are usually taken every day.  Reliever or rescue medicines. These quickly relieve asthma symptoms. They are used as needed and provide short-term relief. Your health care provider will help you create an asthma action plan. An asthma action plan is a written plan for managing and treating your asthma attacks. It includes a list of your asthma triggers and how they may be avoided. It also includes information on when medicines should be taken and when their dosage should be changed. An action plan may also involve the use of a device called a peak flow meter. A peak flow meter measures how well the lungs are working. It helps you monitor your condition. HOME CARE INSTRUCTIONS   Take medicine as directed by your health care provider. Speak with your health care provider if you have questions about how or when to take the medicines.  Use a peak flow meter as directed by your health care provider. Record and keep track of readings.  Understand and use the action plan to help minimize or stop an asthma attack without needing to seek medical care.  Control your home environment in the following ways to help prevent asthma attacks:  Do not smoke. Avoid being exposed to secondhand smoke.  Change your heating and air conditioning filter regularly.  Limit your use of fireplaces and wood stoves.  Get rid of pests (such as roaches and   mice) and their droppings.  Throw away plants if you see mold on them.  Clean your floors and dust regularly. Use unscented cleaning products.  Try to have someone else vacuum for you regularly. Stay out of rooms while they are being  vacuumed and for a short while afterward. If you vacuum, use a dust mask from a hardware store, a double-layered or microfilter vacuum cleaner bag, or a vacuum cleaner with a HEPA filter.  Replace carpet with wood, tile, or vinyl flooring. Carpet can trap dander and dust.  Use allergy-proof pillows, mattress covers, and box spring covers.  Wash bed sheets and blankets every week in hot water and dry them in a dryer.  Use blankets that are made of polyester or cotton.  Clean bathrooms and kitchens with bleach. If possible, have someone repaint the walls in these rooms with mold-resistant paint. Keep out of the rooms that are being cleaned and painted.  Wash hands frequently. SEEK MEDICAL CARE IF:   You have wheezing, shortness of breath, or a cough even if taking medicine to prevent attacks.  The colored mucus you cough up (sputum) is thicker than usual.  Your sputum changes from clear or white to yellow, green, gray, or bloody.  You have any problems that may be related to the medicines you are taking (such as a rash, itching, swelling, or trouble breathing).  You are using a reliever medicine more than 2 3 times per week.  Your peak flow is still at 50 79% of you personal best after following your action plan for 1 hour. SEEK IMMEDIATE MEDICAL CARE IF:   You seem to be getting worse and are unresponsive to treatment during an asthma attack.  You are short of breath even at rest.  You get short of breath when doing very little physical activity.  You have difficulty eating, drinking, or talking due to asthma symptoms.  You develop chest pain.  You develop a fast heartbeat.  You have a bluish color to your lips or fingernails.  You are lightheaded, dizzy, or faint.  Your peak flow is less than 50% of your personal best.  You have a fever or persistent symptoms for more than 2 3 days.  You have a fever and symptoms suddenly get worse. MAKE SURE YOU:   Understand these  instructions.  Will watch your condition.  Will get help right away if you are not doing well or get worse. Document Released: 04/08/2005 Document Revised: 12/09/2012 Document Reviewed: 11/05/2012 [employer] Patient Information 2014 [employer].  

## 2013-07-07 NOTE — ED Notes (Signed)
Resp called to start neb tx.

## 2013-07-07 NOTE — ED Notes (Addendum)
Per EMS: pt from home c/o increased asthma sx x 2 days; pt sts some N/V starting today; pt with wheezing noted; pt 5mg  albuterol and .5 atrovent and 125mg  solumedrol in route with relief; pt given 4mg  zofran for nausea and has 20g in [facility]

## 2013-07-07 NOTE — Discharge Planning (Signed)
[facility] Felicia E, [facility]  Spoke to patient about primary care resources and establishing care with a provider. [facility] at 10:00am.[facility] card application, resource guide, and my contact information was given for any future questions or concerns. Patient is aware of this appointment, patient expressed no other needs at this time.

## 2013-07-07 NOTE — ED Provider Notes (Signed)
CSN: [id number]     Arrival date & time 07/07/13  1028 History   First MD Initiated Contact with Patient 07/07/13 1038     Chief Complaint  Patient presents with  . Shortness of Breath  . Asthma     (Consider location/radiation/quality/duration/timing/severity/associated sxs/prior Treatment) HPI Comments: The patient presents to the ER for evaluation of wheezing and shortness of breath. Patient does have a previous history of asthma. Patient reports that he has not been able to use his nebulizer because his tubing has been malfunctioning. He does not have an inhaler. Patient has had nonproductive cough and increased wheezing. Today he had onset of nausea and vomiting. There is no abdominal pain. Has not had diarrhea. Patient denies fever. Patient arrives by ambulance. He has been given Zofran 4 mg, albuterol 5 mg and Solu-Medrol 125 mg prior to arrival.  Patient is a 33 y.o. male presenting with shortness of breath and asthma.  Shortness of Breath Associated symptoms: cough and vomiting   Asthma Associated symptoms include shortness of breath.    Past Medical History  Diagnosis Date  . Asthma   . ADHD (attention deficit hyperactivity disorder)    Past Surgical History  Procedure Laterality Date  . Tympanoplasty     Family History  Problem Relation Age of Onset  . Asthma Mother   . Heart failure Mother   . Hyperlipidemia Mother   . Hypertension Mother   . Migraines Mother   . Coronary artery disease Father   . Heart attack Father    History  Substance Use Topics  . Smoking status: Never Smoker   . Smokeless tobacco: Not on file  . Alcohol Use: No     Comment: occasionally    Review of Systems  Respiratory: Positive for cough and shortness of breath.   Gastrointestinal: Positive for nausea and vomiting.  All other systems reviewed and are negative.      Allergies  Amoxicillin-pot clavulanate and Other  Home Medications   Current Outpatient Rx  Name  Route   Sig  Dispense  Refill  . albuterol (PROVENTIL) (5 MG/ML) 0.5% nebulizer solution   Nebulization   Take 0.5 mLs (2.5 mg total) by nebulization every 6 (six) hours as needed for wheezing.   20 mL   12    BP 108/62  Pulse 90  Temp(Src) 97.9 F (36.6 C) (Oral)  Resp 18  SpO2 98% Physical Exam  Constitutional: He is oriented to person, place, and time. He appears well-developed and well-nourished. No distress.  HENT:  Head: Normocephalic and atraumatic.  Right Ear: Hearing normal.  Left Ear: Hearing normal.  Nose: Nose normal.  Mouth/Throat: Oropharynx is clear and moist and mucous membranes are normal.  Eyes: Conjunctivae and EOM are normal. Pupils are equal, round, and reactive to light.  Neck: Normal range of motion. Neck supple.  Cardiovascular: Regular rhythm, S1 normal and S2 normal.  Exam reveals no gallop and no friction rub.   No murmur heard. Pulmonary/Chest: Tachypnea noted. No respiratory distress. He has wheezes. He has rhonchi. He exhibits no tenderness.  Abdominal: Soft. Normal appearance and bowel sounds are normal. There is no hepatosplenomegaly. There is no tenderness. There is no rebound, no guarding, no tenderness at McBurney's point and negative Murphy's sign. No hernia.  Musculoskeletal: Normal range of motion.  Neurological: He is alert and oriented to person, place, and time. He has normal strength. No cranial nerve deficit or sensory deficit. Coordination normal. GCS eye subscore is 4. GCS  verbal subscore is 5. GCS motor subscore is 6.  Skin: Skin is warm, dry and intact. No rash noted. No cyanosis.  Psychiatric: He has a normal mood and affect. His speech is normal and behavior is normal. Thought content normal.    ED Course  Procedures (including critical care time) Labs Review Labs Reviewed  CBC WITH DIFFERENTIAL - Abnormal; Notable for the following:    WBC 18.0 (*)    Neutrophils Relative % 92 (*)    Neutro Abs 16.6 (*)    Lymphocytes Relative 7 (*)     Monocytes Relative 1 (*)    All other components within normal limits  BASIC METABOLIC PANEL - Abnormal; Notable for the following:    Glucose, Bld 105 (*)    All other components within normal limits   Imaging Review Dg Chest Port 1 View  07/07/2013   CLINICAL DATA:  Severe shortness of breath today, cough and congestion recently  EXAM: PORTABLE CHEST - 1 VIEW  COMPARISON:  DG CHEST 2 VIEW dated 10/06/2012; DG CHEST 1V PORT dated 11/19/2011; DG CHEST 2 VIEW dated 10/16/2011  FINDINGS: The heart size and mediastinal contours are within normal limits. Both lungs are clear. The visualized skeletal structures are unremarkable.  IMPRESSION: No active disease.   Electronically Signed   By: Esperanza Heiraymond  Rubner M.D.   On: 07/07/2013 14:42     EKG Interpretation None      MDM   Final diagnoses:  None    Patient presents to the ER for evaluation of difficulty breathing. He has a history of asthma and has not had access to his bronchodilators. He did have significant bronchospasm on arrival. Blood work unremarkable other than leukocytosis, unclear etiology. Chest x-ray was clear, no pneumonia. He did have nausea and vomiting today without abdominal pain. Abdominal exam is benign. Patient has had significant improvement with dilators. The ER. He'll be discharged with continued treatment with bronchodilators, prednisone, Phenergan as needed for nausea and vomiting.    Gilda Creasehristopher J. Tyreanna Bisesi, MD 07/07/13 [id number]

## 2013-07-19 ENCOUNTER — Inpatient Hospital Stay: Payer: Self-pay

## 2013-11-01 ENCOUNTER — Emergency Department (HOSPITAL_COMMUNITY)
Admission: EM | Admit: 2013-11-01 | Discharge: 2013-11-01 | Disposition: A | Discharge status: Home / Self Care | Payer: Self-pay | Attending: Emergency Medicine | Admitting: Emergency Medicine

## 2013-11-01 DIAGNOSIS — Z8659 Personal history of other mental and behavioral disorders: Secondary | ICD-10-CM | POA: Insufficient documentation

## 2013-11-01 DIAGNOSIS — Z880 Allergy status to penicillin: Secondary | ICD-10-CM | POA: Insufficient documentation

## 2013-11-01 DIAGNOSIS — R0789 Other chest pain: Secondary | ICD-10-CM | POA: Insufficient documentation

## 2013-11-01 DIAGNOSIS — R059 Cough, unspecified: Secondary | ICD-10-CM | POA: Insufficient documentation

## 2013-11-01 DIAGNOSIS — R05 Cough: Secondary | ICD-10-CM | POA: Insufficient documentation

## 2013-11-01 DIAGNOSIS — IMO0002 Reserved for concepts with insufficient information to code with codable children: Secondary | ICD-10-CM | POA: Insufficient documentation

## 2013-11-01 DIAGNOSIS — J45901 Unspecified asthma with (acute) exacerbation: Secondary | ICD-10-CM | POA: Insufficient documentation

## 2013-11-01 MED ORDER — ALBUTEROL SULFATE HFA 108 (90 BASE) MCG/ACT IN AERS
2.0000 | INHALATION_SPRAY | RESPIRATORY_TRACT | Status: DC | PRN
  Administered 2013-11-01: 2 via RESPIRATORY_TRACT
  Filled 2013-11-01: qty 6.7

## 2013-11-01 MED ORDER — PREDNISONE 20 MG PO TABS: 40.0000 mg | ORAL_TABLET | Freq: Every day | ORAL | Status: DC

## 2013-11-01 MED ORDER — ALBUTEROL SULFATE (2.5 MG/3ML) 0.083% IN NEBU: 2.5000 mg | INHALATION_SOLUTION | RESPIRATORY_TRACT | Status: DC | PRN

## 2013-11-01 MED ORDER — ALBUTEROL SULFATE (2.5 MG/3ML) 0.083% IN NEBU
5.0000 mg | INHALATION_SOLUTION | Freq: Once | RESPIRATORY_TRACT | Status: DC
Start: 2013-11-01 — End: 2013-11-01
  Filled 2013-11-01: qty 6

## 2013-11-01 MED ORDER — PREDNISONE 20 MG PO TABS
60.0000 mg | ORAL_TABLET | Freq: Once | ORAL | Status: AC
  Administered 2013-11-01: 60 mg via ORAL
  Filled 2013-11-01: qty 3

## 2013-11-01 NOTE — ED Provider Notes (Signed)
CSN: [id number]     Arrival date & time 11/01/13  1419 History   First MD Initiated Contact with Patient 11/01/13 1614     Chief Complaint  Patient presents with  . Shortness of Breath     (Consider location/radiation/quality/duration/timing/severity/associated sxs/prior Treatment) HPI Comments: Patient with history of asthma presents with complaint of worsening wheezing over the past one week. Patient states that he has been out of his albuterol nebulizer solution for one week as well. Patient has had cough but no fever. He has had chest tightness with wheezing which is improved with medication. No lower extremity swelling. No nausea or vomiting. No abdominal pain. No URI symptoms. Patient has been unable to see his primary care physician due to financial issues. He feels that he needs to be on a maintenance inhaler. Patient was transported by EMS. He felt much better after initial breathing treatment. The onset of this condition was acute. The course is constant. Aggravating factors: none. Alleviating factors: none.     Patient is a 33 y.o. male presenting with shortness of breath. The history is provided by the patient and medical records.  Shortness of Breath Associated symptoms: cough and wheezing   Associated symptoms: no abdominal pain, no chest pain, no fever, no headaches, no rash, no sore throat and no vomiting     Past Medical History  Diagnosis Date  . Asthma   . ADHD (attention deficit hyperactivity disorder)    Past Surgical History  Procedure Laterality Date  . Tympanoplasty     Family History  Problem Relation Age of Onset  . Asthma Mother   . Heart failure Mother   . Hyperlipidemia Mother   . Hypertension Mother   . Migraines Mother   . Coronary artery disease Father   . Heart attack Father    History  Substance Use Topics  . Smoking status: Never Smoker   . Smokeless tobacco: Not on file  . Alcohol Use: No     Comment: occasionally    Review of Systems   Constitutional: Negative for fever.  HENT: Negative for rhinorrhea and sore throat.   Eyes: Negative for redness.  Respiratory: Positive for cough, chest tightness (improved with treatment, worse with wheezing), shortness of breath and wheezing.   Cardiovascular: Negative for chest pain and palpitations.  Gastrointestinal: Negative for nausea, vomiting, abdominal pain and diarrhea.  Genitourinary: Negative for dysuria.  Musculoskeletal: Negative for myalgias.  Skin: Negative for rash.  Neurological: Negative for headaches.   Allergies  Amoxicillin-pot clavulanate and Other  Home Medications   Prior to Admission medications   Medication Sig Start Date End Date Taking? Authorizing Provider  albuterol (PROVENTIL) (2.5 MG/3ML) 0.083% nebulizer solution Take 3 mLs (2.5 mg total) by nebulization every 4 (four) hours as needed for wheezing or shortness of breath. 07/07/13   Gilda Crease, MD  albuterol (PROVENTIL) (5 MG/ML) 0.5% nebulizer solution Take 0.5 mLs (2.5 mg total) by nebulization every 6 (six) hours as needed for wheezing. 01/11/13   Derwood Kaplan, MD  predniSONE (DELTASONE) 20 MG tablet Take 3 tablets (60 mg total) by mouth daily with breakfast. 07/07/13   Gilda Crease, MD   BP 116/64  Pulse 95  Temp(Src) 98.2 F (36.8 C)  Resp 20  Wt 187 lb (84.823 kg)  SpO2 94%  Physical Exam  Nursing note and vitals reviewed. Constitutional: He appears well-developed and well-nourished.  HENT:  Head: Normocephalic and atraumatic.  Nose: Nose normal.  Mouth/Throat: Oropharynx is clear and  moist. No oropharyngeal exudate.  Eyes: Conjunctivae are normal. Right eye exhibits no discharge. Left eye exhibits no discharge.  Neck: Normal range of motion. Neck supple.  Cardiovascular: Normal rate, regular rhythm and normal heart sounds.   No murmur heard. Pulmonary/Chest: Effort normal and breath sounds normal. No respiratory distress. He has no wheezes. He has no rales.   Abdominal: Soft. There is no tenderness. There is no rebound and no guarding.  Neurological: He is alert.  Skin: Skin is warm and dry.  Psychiatric: He has a normal mood and affect.    ED Course  Procedures (including critical care time) Labs Review Labs Reviewed - No data to display  Imaging Review No results found.   EKG Interpretation None      4:31 PM Patient seen and examined. Patient currently receiving second albuterol treatment. Lungs are currently clear. He states that his wheezing and chest tightness is resolved. Will give oral prednisone. Likely will discharge home with nebulizer solution and inhaler, prednisone when treatment is complete.  Vital signs reviewed and are as follows: Filed Vitals:   11/01/13 1422  BP: 116/64  Pulse: 95  Temp: 98.2 F (36.8 C)  Resp: 20   5:00 PM patient continues to do well. He has no reservations about being discharged home. Lungs are clear on exam. Good air movement and no wheezing. Will discharge to home with albuterol inhaler, albuterol nebulizer solution, prednisone for 4 additional days  Urged PCP to discuss need for maintenance regimen. Patient urged to return with worsening symptoms or other concerns. Patient verbalized understanding and agrees with plan.     MDM   Final diagnoses:  Asthma exacerbation   Patient with asthma exacerbation in the setting of being out of his rescue medication. Patient has no signs of systemic infection. Wheezing well controlled after breathing treatment in emergency department. No persistent chest pain or signs of ACS. Do not suspect PE. He wants to go home. He appears well, nontoxic. Oxygen saturation is normal in room air discharge. Will discharge to home.  No dangerous or life-threatening conditions suspected or identified by history, physical exam, and by work-up. No indications for hospitalization identified.      Renne CriglerJoshua Laruen Risser, PA-C 11/01/13 1701

## 2013-11-01 NOTE — ED Notes (Addendum)
Pt arrives sob from ems states is out of albuteral sob x 45 mins  Pt givem 5 albuteral per ems

## 2013-11-01 NOTE — Discharge Instructions (Signed)
Please read and follow all provided instructions.  Your diagnoses today include:  1. Asthma exacerbation    Tests performed today include:  Vital signs. See below for your results today.   Medications prescribed:   Albuterol inhaler - medication that opens up your airway  Use inhaler as follows: 1-2 puffs with spacer every 4 hours as needed for wheezing, cough, or shortness of breath.    Prednisone - steroid medicine   It is best to take this medication in the morning to prevent sleeping problems. If you are diabetic, monitor your blood sugar closely and stop taking Prednisone if blood sugar is over 300. Take with food to prevent stomach upset.   Take any prescribed medications only as directed.  Home care instructions:  Follow any educational materials contained in this packet.  Follow-up instructions: Please follow-up with your primary care provider in the next 3 days for further evaluation of your symptoms and management of your asthma.  Return instructions:   Please return to the Emergency Department if you experience worsening symptoms.  Please return with worsening wheezing, shortness of breath, or difficulty breathing.  Return with persistent fever above 101F.   Please return if you have any other emergent concerns.  Additional Information:  Your vital signs today were: BP 116/64   Pulse 95   Temp(Src) 98.2 F (36.8 C)   Resp 20   Wt 187 lb (84.823 kg)   SpO2 94% If your blood pressure (BP) was elevated above 135/85 this visit, please have this repeated by your doctor within one month. --------------

## 2013-11-02 NOTE — ED Provider Notes (Signed)
Medical screening examination/treatment/procedure(s) were performed by non-physician practitioner and as supervising physician I was immediately available for consultation/collaboration.   EKG Interpretation None       Spring San, MD 11/02/13 0136 

## 2013-12-31 ENCOUNTER — Encounter (HOSPITAL_COMMUNITY): Payer: Self-pay | Admitting: Emergency Medicine

## 2013-12-31 ENCOUNTER — Emergency Department (HOSPITAL_COMMUNITY)
Admission: EM | Admit: 2013-12-31 | Discharge: 2013-12-31 | Disposition: A | Discharge status: Home / Self Care | Payer: Self-pay | Attending: Emergency Medicine | Admitting: Emergency Medicine

## 2013-12-31 ENCOUNTER — Emergency Department (HOSPITAL_COMMUNITY): Payer: Self-pay

## 2013-12-31 DIAGNOSIS — Z79899 Other long term (current) drug therapy: Secondary | ICD-10-CM | POA: Insufficient documentation

## 2013-12-31 DIAGNOSIS — Z8659 Personal history of other mental and behavioral disorders: Secondary | ICD-10-CM | POA: Insufficient documentation

## 2013-12-31 DIAGNOSIS — R0602 Shortness of breath: Secondary | ICD-10-CM | POA: Insufficient documentation

## 2013-12-31 DIAGNOSIS — J45901 Unspecified asthma with (acute) exacerbation: Secondary | ICD-10-CM | POA: Insufficient documentation

## 2013-12-31 DIAGNOSIS — Z880 Allergy status to penicillin: Secondary | ICD-10-CM | POA: Insufficient documentation

## 2013-12-31 MED ORDER — SODIUM CHLORIDE 0.9 % IV BOLUS (SEPSIS)
1000.0000 mL | Freq: Once | INTRAVENOUS | Status: AC
  Administered 2013-12-31: 1000 mL via INTRAVENOUS

## 2013-12-31 MED ORDER — PREDNISONE 50 MG PO TABS
ORAL_TABLET | ORAL | Status: DC
Start: 2013-12-31 — End: 2014-01-09

## 2013-12-31 MED ORDER — ALBUTEROL SULFATE HFA 108 (90 BASE) MCG/ACT IN AERS
2.0000 | INHALATION_SPRAY | Freq: Once | RESPIRATORY_TRACT | Status: AC
  Administered 2013-12-31: 2 via RESPIRATORY_TRACT
  Filled 2013-12-31: qty 6.7

## 2013-12-31 MED ORDER — ALBUTEROL SULFATE (2.5 MG/3ML) 0.083% IN NEBU: 2.5000 mg | INHALATION_SOLUTION | RESPIRATORY_TRACT | Status: DC | PRN

## 2013-12-31 MED ORDER — ALBUTEROL SULFATE (2.5 MG/3ML) 0.083% IN NEBU
5.0000 mg | INHALATION_SOLUTION | Freq: Once | RESPIRATORY_TRACT | Status: AC
  Administered 2013-12-31: 5 mg via RESPIRATORY_TRACT
  Filled 2013-12-31: qty 6

## 2013-12-31 NOTE — ED Notes (Signed)
Joni Reining, PA back at the bedside.

## 2013-12-31 NOTE — ED Notes (Signed)
PT ambulated with baseline gait; VSS; A&Ox3; no signs of distress; respirations even and unlabored; skin warm and dry; no questions upon discharge.  

## 2013-12-31 NOTE — Discharge Instructions (Signed)
Do not hesitate to return to the emergency room for any new, worsening or concerning symptoms.  Please obtain primary care using resource guide below. But the minute you were seen in the emergency room and that they will need to obtain records for further outpatient management.   Asthma Asthma is a recurring condition in which the airways tighten and narrow. Asthma can make it difficult to breathe. It can cause coughing, wheezing, and shortness of breath. Asthma episodes, also called asthma attacks, range from minor to life-threatening. Asthma cannot be cured, but medicines and lifestyle changes can help control it. CAUSES Asthma is believed to be caused by inherited (genetic) and environmental factors, but its exact cause is unknown. Asthma may be triggered by allergens, lung infections, or irritants in the air. Asthma triggers are different for each person. Common triggers include:   Animal dander.  Dust mites.  Cockroaches.  Pollen from trees or grass.  Mold.  Smoke.  Air pollutants such as dust, household cleaners, hair sprays, aerosol sprays, paint fumes, strong chemicals, or strong odors.  Cold air, weather changes, and winds (which increase molds and pollens in the air).  Strong emotional expressions such as crying or laughing hard.  Stress.  Certain medicines (such as aspirin) or types of drugs (such as beta-blockers).  Sulfites in foods and drinks. Foods and drinks that may contain sulfites include dried fruit, potato chips, and sparkling grape juice.  Infections or inflammatory conditions such as the flu, a cold, or an inflammation of the nasal membranes (rhinitis).  Gastroesophageal reflux disease (GERD).  Exercise or strenuous activity. SYMPTOMS Symptoms may occur immediately after asthma is triggered or many hours later. Symptoms include:  Wheezing.  Excessive nighttime or early morning coughing.  Frequent or severe coughing with a common cold.  Chest  tightness.  Shortness of breath. DIAGNOSIS  The diagnosis of asthma is made by a review of your medical history and a physical exam. Tests may also be performed. These may include:  Lung function studies. These tests show how much air you breathe in and out.  Allergy tests.  Imaging tests such as X-rays. TREATMENT  Asthma cannot be cured, but it can usually be controlled. Treatment involves identifying and avoiding your asthma triggers. It also involves medicines. There are 2 classes of medicine used for asthma treatment:   Controller medicines. These prevent asthma symptoms from occurring. They are usually taken every day.  Reliever or rescue medicines. These quickly relieve asthma symptoms. They are used as needed and provide short-term relief. Your health care provider will help you create an asthma action plan. An asthma action plan is a written plan for managing and treating your asthma attacks. It includes a list of your asthma triggers and how they may be avoided. It also includes information on when medicines should be taken and when their dosage should be changed. An action plan may also involve the use of a device called a peak flow meter. A peak flow meter measures how well the lungs are working. It helps you monitor your condition. HOME CARE INSTRUCTIONS   Take medicines only as directed by your health care provider. Speak with your health care provider if you have questions about how or when to take the medicines.  Use a peak flow meter as directed by your health care provider. Record and keep track of readings.  Understand and use the action plan to help minimize or stop an asthma attack without needing to seek medical care.  Control  your home environment in the following ways to help prevent asthma attacks:  Do not smoke. Avoid being exposed to secondhand smoke.  Change your heating and air conditioning filter regularly.  Limit your use of fireplaces and wood  stoves.  Get rid of pests (such as roaches and mice) and their droppings.  Throw away plants if you see mold on them.  Clean your floors and dust regularly. Use unscented cleaning products.  Try to have someone else vacuum for you regularly. Stay out of rooms while they are being vacuumed and for a short while afterward. If you vacuum, use a dust mask from a hardware store, a double-layered or microfilter vacuum cleaner bag, or a vacuum cleaner with a HEPA filter.  Replace carpet with wood, tile, or vinyl flooring. Carpet can trap dander and dust.  Use allergy-proof pillows, mattress covers, and box spring covers.  Wash bed sheets and blankets every week in hot water and dry them in a dryer.  Use blankets that are made of polyester or cotton.  Clean bathrooms and kitchens with bleach. If possible, have someone repaint the walls in these rooms with mold-resistant paint. Keep out of the rooms that are being cleaned and painted.  Wash hands frequently. SEEK MEDICAL CARE IF:   You have wheezing, shortness of breath, or a cough even if taking medicine to prevent attacks.  The colored mucus you cough up (sputum) is thicker than usual.  Your sputum changes from clear or white to yellow, green, gray, or bloody.  You have any problems that may be related to the medicines you are taking (such as a rash, itching, swelling, or trouble breathing).  You are using a reliever medicine more than 2-3 times per week.  Your peak flow is still at 50-79% of your personal best after following your action plan for 1 hour.  You have a fever. SEEK IMMEDIATE MEDICAL CARE IF:   You seem to be getting worse and are unresponsive to treatment during an asthma attack.  You are short of breath even at rest.  You get short of breath when doing very little physical activity.  You have difficulty eating, drinking, or talking due to asthma symptoms.  You develop chest pain.  You develop a fast  heartbeat.  You have a bluish color to your lips or fingernails.  You are light-headed, dizzy, or faint.  Your peak flow is less than 50% of your personal best. MAKE SURE YOU:   Understand these instructions.  Will watch your condition.  Will get help right away if you are not doing well or get worse. Document Released: 04/08/2005 Document Revised: 08/23/2013 Document Reviewed: 11/05/2012 [place] Patient Information 2015 [place], [place]. This information is not intended to replace advice given to you by your health care provider. Make sure you discuss any questions you have with your health care provider.   Emergency Department Resource Guide 1) Find a Doctor and Pay Out of Pocket Although you won't have to find out who is covered by your insurance plan, it is a good idea to ask around and get recommendations. You will then need to call the office and see if the doctor you have chosen will accept you as a new patient and what types of options they offer for patients who are self-pay. Some doctors offer discounts or will set up payment plans for their patients who do not have insurance, but you will need to ask so you aren't surprised when you get to your  appointment.  2) Contact Your Local Health Department Not all health departments have doctors that can see patients for sick visits, but many do, so it is worth a call to see if yours does. If you don't know where your local health department is, you can check in your phone book. The CDC also has a tool to help you locate your state's health department, and many state websites also have listings of all of their local health departments.  3) Find a Walk-in Clinic If your illness is not likely to be very severe or complicated, you may want to try a walk in clinic. These are popping up all over the country in pharmacies, drugstores, and shopping centers. They're usually staffed by nurse practitioners or physician assistants that have been  trained to treat common illnesses and complaints. They're usually fairly quick and inexpensive. However, if you have serious medical issues or chronic medical problems, these are probably not your best option.  No Primary Care Doctor: - Call Health Connect at  [phone] - they can help you locate a primary care doctor that  accepts your insurance, provides certain services, etc. - Physician Referral Service- [phone]  Chronic Pain Problems: Organization         Address  Phone   Notes  [facility]  [phone] Patients need to be referred by their primary care doctor.   Medication Assistance: Organization         Address  Phone   Notes  [facility] Medication [facility] [address] [place] [place], [place] [address] [phone] --Must be a resident of [facility] -- Must have NO insurance coverage whatsoever (no Medicaid/ Medicare, etc.) -- The pt. MUST have a primary care doctor that directs their care regularly and follows them in the community   [facility]  [phone]   [employer]  [phone]    Agencies that provide inexpensive medical care: Organization         Address  Phone   Notes  [facility]  [phone]   [facility]    [phone]   [facility] [address] [place], [place] [address] [phone]   [facility] [address] [place], [place] [phone]   Planned Parenthood    [phone]   [facility]    [phone]   [facility]  [address], [place] Phone:  [phone], Fax:  [phone] Hours of Operation:  9 am - 6 pm, M-F.  Also accepts Medicaid/Medicare and self-pay.  [facility]  [address], [place] Phone: [phone], Fax: [phone]. Hours of Operation:  8:30 am - 5:30 pm, M-F.  Also accepts Medicaid and self-pay.   [facility] [place] [address] [place] Phone: [phone]   [facility] [address] Natasha Bence [place], [place] [phone], Ext. [phone] Mondays & Thursdays: 7-9 AM.  First 15 patients are seen on a first come, first serve basis.    Medicaid-accepting [facility] Providers:  Organization         Address  Phone   Notes  [facility] [address],  [phone] Also accepts self-pay patients.  [facility] [address] Laurell Josephs [place], [place]  [phone]   [facility] [address], [place] [phone]   [facility] [address] [place] [address], [place] [phone]   Renaye Rakers [address], [place]   [phone] Only accepts [place] Access [place] patients after they have their name applied to their card.   Self-Pay (no insurance) in [facility]:  Organization         Address  Phone   Notes  Sickle Cell Patients, [facility] [address] [place], [place] [phone]   [facility] [address] [place], [place] [phone]   [facility] [place]  [address], [place] [phone]   [facility]/Dr. Osei-Bonsu  [address], [place] or [address], [place] [phone] Phone number for both [place] and [place] locations is the same.  [facility] [address], [place] [phone]   [facility] [address], [place] or [address] [phone]   [facility] [address], [place] [phone], phone; [phone], fax Sees patients 1st and 3rd Saturday of every month.  Must not qualify for public or private insurance (i.e. Medicaid, Medicare, [place] Health Choice, Veterans' Benefits)  Household income should be no  more than 200% of the poverty level The clinic cannot treat you if you are pregnant or think you are pregnant  Sexually transmitted diseases are not treated at the clinic.    Dental Care: Organization         Address  Phone  Notes  [facility] Department of [facility] [address] [place], [place] [phone] Accepts children up to age 60 who are enrolled in [place] or [place] Health Choice; pregnant women with a Medicaid card; and children who have applied for Medicaid or [place] Health Choice, but were declined, whose parents can pay a reduced fee at time of service.  [facility]  [address], [place] [phone] Accepts children up to age 18 who are enrolled in [place] or [place] Health Choice; pregnant women with a Medicaid card; and children who have applied for Medicaid or [place] Health Choice, but were declined, whose parents can pay a reduced fee at time of service.  [facility]  [address] [place], [place] [phone] Patients are seen by appointment only. Walk-ins are not accepted. [facility] will see patients 40 years of age and older. Monday - Tuesday (8am-5pm) Most Wednesdays (8:30-5pm) $30 per visit, cash only  [facility] Adult Dental Access [facility]  [address], [facility] [phone] Patients are seen by appointment only. Walk-ins are not accepted. [facility] will see patients 52 years of age and older. One Wednesday Evening (Monthly: Volunteer Based).  $30 per visit, cash only  [employer]  [phone] for adults; Children under age 5, call Graduate Pediatric Dentistry at [phone]. Children aged 61-14, please call [phone] to request a pediatric application.  Dental services are provided in all areas of dental care including fillings, crowns and bridges, complete and partial dentures, implants, gum treatment, root canals,  and extractions. Preventive care is also provided. Treatment is provided to both adults and children. Patients are selected via a lottery and there is often a waiting list.   [facility] [address], [place] Otto  [phone] [email or web address]   [facility] [address] [place], [place] [phone], Ext. [phone] Second and Fourth Thursday of each month, opens at 6:30 AM; Clinic ends at 9 AM.  Patients are seen on a first-come first-served basis, and a limited number are seen during each clinic.   [facility]  [address] Ether Griffins [place], [place] [phone]   Eligibility Requirements You must have lived in [place], [place], or [place] counties for at least the last three months.   You cannot be eligible for state or federal sponsored [place], including [employer], [place], or [employer].   You generally cannot be eligible for healthcare insurance through your employer.    How to apply: Eligibility screenings are held every Tuesday and Wednesday afternoon from 1:00 pm until 4:00 pm. You do not need an appointment for the interview!  [facility] [address] [place], [place] [phone]   [facility] Health Department  [phone]   [facility] Health Department  [phone]   [facility] Health Department  [phone]    Behavioral Health Resources in the Community: Intensive Outpatient Programs Organization         Address  Phone  Notes  [facility] Services [address] [place], [place] [phone]   [facility] Outpatient [address] [place], [place] [phone]   [facility] [address], [place], [place]  [phone]   [facility] [address]  [place], [place] [phone] or [phone]   Substance Abuse Resources Organization         Address  Phone  Notes  [facility]  [phone]    [facility]  [phone]   [facility] [place]  [phone]   [place]  [phone]   Residential & Outpatient Substance Abuse Program  [phone]   Psychological Services Organization         Address  Phone  Notes  [facility]  [phone]   [facility]  [phone]   [facility] [address] [place] [phone] or [phone]    Mobile Crisis Teams Organization         Address  Phone  Notes  [facility], Mobile Crisis Care Unit  [phone]   [facility]  [address] [place], [place] [phone]   Doristine Locks [address] [place] [phone]    Self-Help/Support Groups Organization         Address  Phone             Notes  [facility] [place] - variety of support groups  336- I7437963 Call for more information  Narcotics Anonymous (NA), [facility] [address], [employer] [place]  2 meetings at this location   Statistician         Address  Phone  Notes  [facility] [address],    [place]  [phone]   [facility]  [address] [place] [address], [place], [place] [phone]   [facility] [address] [place], [place] [phone] Admissions: 8am-3pm M-F  [facility] [address],    [place], [place] [phone]   [facility] [address] [place], [place], [place] [phone]   [facility] [address],  [place], [place] [phone]  [facility] - Intensive Outpatient [address] Laurell Josephs [address], [place], [place] [phone]   [facility] ([facility] [address] [place]  [employer], [place] [phone] or [phone]   [facility] ([facility]) [address], [place], [place] [phone] Accepts Medicaid  [facility] [address],   [place] [phone] Substance Abuse/Addiction Treatment   [facility] Organization         Address  Phone  Notes  [facility]  [phone]   Angie Fava, PhD [address] Ervin Knack [place], [place]   [phone] or [phone]   [facility] Behavior[facility]   [address] [place], [place] [phone]   [facility] [address] [place], [place] [phone] Insurance/Medicaid/sponsorship through [facility] [address]                                    [place], [place] [phone] Therapy/tele-psych/case  [facility] [address], [place] [phone]    Dr. Lolly Mustache  [phone]   [facility] [place]  [employer] [facility] Dept. 1) [address], [place] 2) [address], [place] 3)  [address], [place] [phone]  [phone]   [facility] Child Abuse Hotline ([phone] or [phone] (After Hours)

## 2013-12-31 NOTE — ED Provider Notes (Signed)
CSN: [id number]     Arrival date & time 12/31/13  0605 History   None    Chief Complaint  Patient presents with  . Shortness of Breath  . Asthma     (Consider location/radiation/quality/duration/timing/severity/associated sxs/prior Treatment) HPI  Devin [name] is a 33 y.o. male complaining of complaining of asthma exacerbation which woke him from sleep several hours ago, associated symptoms of dry cough onset yesterday. Patient gave himself a nebulizer treatment at home with little relief. EMS was called they administered one 25 of Solu-Medrol and patient received to do a nebs and route with good relief. Patient states that he has no shortness of breath or difficulty breathing at this time but just feels tired. Patient denies fever, chills, chest pain, calf pain, leg swelling, history of DVT or PE, recent mobilizations, abdominal pain nausea vomiting change in bowel or bladder habits.   Past Medical History  Diagnosis Date  . Asthma   . ADHD (attention deficit hyperactivity disorder)    Past Surgical History  Procedure Laterality Date  . Tympanoplasty     Family History  Problem Relation Age of Onset  . Asthma Mother   . Heart failure Mother   . Hyperlipidemia Mother   . Hypertension Mother   . Migraines Mother   . Coronary artery disease Father   . Heart attack Father    History  Substance Use Topics  . Smoking status: Never Smoker   . Smokeless tobacco: Not on file  . Alcohol Use: Yes     Comment: occasionally    Review of Systems  10 systems reviewed and found to be negative, except as noted in the HPI.   Allergies  Amoxicillin-pot clavulanate and Other  Home Medications   Prior to Admission medications   Medication Sig Start Date End Date Taking? Authorizing Provider  albuterol (PROVENTIL) (2.5 MG/3ML) 0.083% nebulizer solution Take 3 mLs (2.5 mg total) by nebulization every 4 (four) hours as needed for wheezing or shortness of breath. 11/01/13  Yes Renne Crigler, PA-C  albuterol (PROVENTIL) (2.5 MG/3ML) 0.083% nebulizer solution Take 3 mLs (2.5 mg total) by nebulization every 4 (four) hours as needed for wheezing or shortness of breath. 12/31/13   Kasarah Sitts, PA-C  predniSONE (DELTASONE) 50 MG tablet Take 1 tablet daily with breakfast 12/31/13   Audreyana Huntsberry, PA-C   BP 122/82  Pulse 91  Temp(Src) 97.4 F (36.3 C) (Oral)  Resp 18  Ht  (1.753 m)  Wt 187 lb (84.823 kg)  BMI 27.60 kg/m2  SpO2 97% Physical Exam  Nursing note and vitals reviewed. Constitutional: He is oriented to person, place, and time. He appears well-developed and well-nourished. No distress.  Climbing comfortably in bed, sleeping on my arrival  HENT:  Head: Normocephalic and atraumatic.  Mouth/Throat: Oropharynx is clear and moist.  Eyes: Conjunctivae and EOM are normal. Pupils are equal, round, and reactive to light.  Neck: Normal range of motion.  Cardiovascular: Normal rate, regular rhythm and intact distal pulses.   Pulmonary/Chest: Effort normal and breath sounds normal. No stridor. No respiratory distress. He has no wheezes. He has no rales. He exhibits no tenderness.  Excellent air movement in all fields, no wheezing patient speaking in complete sentences and in no distress.  Abdominal: Soft. Bowel sounds are normal. He exhibits no distension and no mass. There is no tenderness. There is no rebound and no guarding.  Musculoskeletal: Normal range of motion. He exhibits no edema and no tenderness.  No calf  asymmetry, superficial collaterals, palpable cords, edema, Homans sign negative bilaterally.    Neurological: He is alert and oriented to person, place, and time.  Psychiatric: He has a normal mood and affect.    ED Course  Procedures (including critical care time) Labs Review Labs Reviewed - No data to display  Imaging Review Dg Chest 2 View  12/31/2013   CLINICAL DATA:  Shortness of breath  EXAM: CHEST  2 VIEW  COMPARISON:  07/07/2013   FINDINGS: The heart size and mediastinal contours are within normal limits. Both lungs are clear. The visualized skeletal structures are unremarkable.  IMPRESSION: No active cardiopulmonary disease.   Electronically Signed   By: Alcide Clever M.D.   On: 12/31/2013 07:29     EKG Interpretation None      MDM   Final diagnoses:  Asthma exacerbation    Filed Vitals:   12/31/13 0751 12/31/13 0800 12/31/13 0815 12/31/13 0841  BP:  114/82 123/85 122/82  Pulse:  91 98 91  Temp:    97.4 F (36.3 C)  TempSrc:    Oral  Resp:  Height:      Weight:      SpO2: 99% 98% 94% 97%    Medications  albuterol (PROVENTIL) (2.5 MG/3ML) 0.083% nebulizer solution 5 mg (5 mg Nebulization Given 12/31/13 0747)  sodium chloride 0.9 % bolus 1,000 mL (0 mLs Intravenous Stopped 12/31/13 0848)  albuterol (PROVENTIL HFA;VENTOLIN HFA) 108 (90 BASE) MCG/ACT inhaler 2 puff (2 puffs Inhalation Given 12/31/13 0851)    Devin [name] is a 33 y.o. male presenting with asthma exacerbation. Patient was given Solu-Medrol and to do a nebs and route. On my exam patient has no wheezing, excellent air movement in all fields and reports significant subjective improvement. Patient has subjective cough so chest x-ray was ordered which was negative for infiltrate. Patient is given another nebulizer in the ED and reevaluated with normal lung sounds. He will be given albuterol inhaler before he believes the ED, I will refill his albuterol nebulizer prescription and start him on a prednisone burst.  Evaluation does not show pathology that would require ongoing emergent intervention or inpatient treatment. Pt is hemodynamically stable and mentating appropriately. Discussed findings and plan with patient/guardian, who agrees with care plan. All questions answered. Return precautions discussed and outpatient follow up given.   Discharge Medication List as of 12/31/2013  8:38 AM    START taking these medications   Details  !!  albuterol (PROVENTIL) (2.5 MG/3ML) 0.083% nebulizer solution Take 3 mLs (2.5 mg total) by nebulization every 4 (four) hours as needed for wheezing or shortness of breath., Starting 12/31/2013, Until Discontinued, Print    predniSONE (DELTASONE) 50 MG tablet Take 1 tablet daily with breakfast, Print     !! - Potential duplicate medications found. Please discuss with provider.           Wynetta Emery, PA-C 12/31/13 1013

## 2013-12-31 NOTE — ED Notes (Signed)
Per EMS, pt woke up from his sleep with diffficulty breathing. Pt administered 1 albuterol treatment at home with no relief. EMS gave an 2 duo neb treatment (5 albuterol and .5 atrovent.) EMS also gave 125 of Solumedrol. NAD at this time. Pt reports that he feels some better.

## 2013-12-31 NOTE — ED Provider Notes (Signed)
Medical screening examination/treatment/procedure(s) were performed by non-physician practitioner and as supervising physician I was immediately available for consultation/collaboration.   EKG Interpretation None      Sequoia Witz, MD, FACEP   Nyjah Schwake L Adalina Dopson, MD 12/31/13 1545 

## 2013-12-31 NOTE — ED Notes (Signed)
Pt taken to xray 

## 2014-01-09 ENCOUNTER — Emergency Department (HOSPITAL_COMMUNITY)
Admission: EM | Admit: 2014-01-09 | Discharge: 2014-01-09 | Disposition: A | Discharge status: Home / Self Care | Payer: Self-pay | Attending: Emergency Medicine | Admitting: Emergency Medicine

## 2014-01-09 ENCOUNTER — Emergency Department (HOSPITAL_COMMUNITY): Payer: Self-pay

## 2014-01-09 ENCOUNTER — Encounter (HOSPITAL_COMMUNITY): Payer: Self-pay | Admitting: Emergency Medicine

## 2014-01-09 DIAGNOSIS — J45902 Unspecified asthma with status asthmaticus: Secondary | ICD-10-CM | POA: Insufficient documentation

## 2014-01-09 DIAGNOSIS — Z8659 Personal history of other mental and behavioral disorders: Secondary | ICD-10-CM | POA: Insufficient documentation

## 2014-01-09 DIAGNOSIS — Z79899 Other long term (current) drug therapy: Secondary | ICD-10-CM | POA: Insufficient documentation

## 2014-01-09 DIAGNOSIS — J4532 Mild persistent asthma with status asthmaticus: Secondary | ICD-10-CM

## 2014-01-09 DIAGNOSIS — Z880 Allergy status to penicillin: Secondary | ICD-10-CM | POA: Insufficient documentation

## 2014-01-09 DIAGNOSIS — J45909 Unspecified asthma, uncomplicated: Secondary | ICD-10-CM | POA: Insufficient documentation

## 2014-01-09 MED ORDER — IPRATROPIUM BROMIDE 0.02 % IN SOLN
0.5000 mg | Freq: Once | RESPIRATORY_TRACT | Status: AC
  Administered 2014-01-09: 0.5 mg via RESPIRATORY_TRACT
  Filled 2014-01-09: qty 2.5

## 2014-01-09 MED ORDER — METHYLPREDNISOLONE SODIUM SUCC 125 MG IJ SOLR: 125.0000 mg | Freq: Once | INTRAMUSCULAR | Status: DC

## 2014-01-09 MED ORDER — PREDNISONE 50 MG PO TABS: 50.0000 mg | ORAL_TABLET | Freq: Every day | ORAL | Status: DC

## 2014-01-09 MED ORDER — ALBUTEROL (5 MG/ML) CONTINUOUS INHALATION SOLN
10.0000 mg/h | INHALATION_SOLUTION | Freq: Once | RESPIRATORY_TRACT | Status: AC
  Administered 2014-01-09: 10 mg/h via RESPIRATORY_TRACT
  Filled 2014-01-09: qty 20

## 2014-01-09 MED ORDER — AMOXICILLIN 500 MG PO CAPS: 500.0000 mg | ORAL_CAPSULE | Freq: Three times a day (TID) | ORAL | Status: DC

## 2014-01-09 MED ORDER — ALBUTEROL SULFATE HFA 108 (90 BASE) MCG/ACT IN AERS
1.0000 | INHALATION_SPRAY | Freq: Once | RESPIRATORY_TRACT | Status: AC
  Administered 2014-01-09: 1 via RESPIRATORY_TRACT
  Filled 2014-01-09: qty 6.7

## 2014-01-09 NOTE — ED Notes (Signed)
Per EMS, pt woke up at 1:30 feeling SOB. Pt has hx of asthma. Pt had two nebulizer treatments of  of albuterol prior to EMS arrival with no relief. EMS administered and additional  of albuterol,  of atrovent and 125 of solumedrol  in route. Pt reports feeling better at this time. NAD at this time. Pt reports URI x 2 weeks.

## 2014-01-09 NOTE — Discharge Instructions (Signed)

## 2014-01-10 NOTE — ED Provider Notes (Signed)
CSN: [id number]     Arrival date & time 01/09/14  0234 History   First MD Initiated Contact with Patient 01/09/14 0303     Chief Complaint  Patient presents with  . Shortness of Breath  . Asthma     (Consider location/radiation/quality/duration/timing/severity/associated sxs/prior Treatment) HPI Comments: SUBJECTIVE:  Devin [name] is a 33 y.o. male seen urgently with exacerbation of asthma for 1 days. Wheezing is described as moderate to severe. Associated symptoms:congestion and sneezing and cough. Current asthma medications: none. Patient denies smoke cigarettes.   Patient is a 33 y.o. male presenting with shortness of breath and asthma. The history is provided by the patient.  Shortness of Breath Associated symptoms: cough and wheezing   Associated symptoms: no chest pain   Asthma Associated symptoms include shortness of breath. Pertinent negatives include no chest pain.    Past Medical History  Diagnosis Date  . Asthma   . ADHD (attention deficit hyperactivity disorder)    Past Surgical History  Procedure Laterality Date  . Tympanoplasty     Family History  Problem Relation Age of Onset  . Asthma Mother   . Heart failure Mother   . Hyperlipidemia Mother   . Hypertension Mother   . Migraines Mother   . Coronary artery disease Father   . Heart attack Father    History  Substance Use Topics  . Smoking status: Never Smoker   . Smokeless tobacco: Not on file  . Alcohol Use: Yes     Comment: occasionally    Review of Systems  Constitutional: Positive for activity change.  Respiratory: Positive for cough, shortness of breath and wheezing.   Cardiovascular: Negative for chest pain.  Allergic/Immunologic: Negative for immunocompromised state.  Neurological: Negative for weakness.  Hematological: Does not bruise/bleed easily.      Allergies  Amoxicillin-pot clavulanate and Other  Home Medications   Prior to Admission medications   Medication Sig Start Date  End Date Taking? Authorizing Provider  albuterol (PROVENTIL) (2.5 MG/3ML) 0.083% nebulizer solution Take 3 mLs (2.5 mg total) by nebulization every 4 (four) hours as needed for wheezing or shortness of breath. 12/31/13  Yes Nicole Pisciotta, PA-C  amoxicillin (AMOXIL) 500 MG capsule Take 1 capsule (500 mg total) by mouth 3 (three) times daily. 01/09/14   Derwood Kaplan, MD  predniSONE (DELTASONE) 50 MG tablet Take 1 tablet (50 mg total) by mouth daily. 01/09/14   Shealeigh Dunstan, MD   BP 110/61  Pulse 94  Temp(Src) 97.9 F (36.6 C) (Oral)  Resp 14  SpO2 96% Physical Exam  Nursing note and vitals reviewed. Constitutional: He is oriented to person, place, and time. He appears well-developed.  Eyes: Conjunctivae are normal.  Neck: Neck supple.  Cardiovascular: Normal rate.   Pulmonary/Chest: Effort normal. He has wheezes.  Neurological: He is alert and oriented to person, place, and time.    ED Course  Procedures (including critical care time) Labs Review Labs Reviewed - No data to display  Imaging Review Dg Chest 2 View  01/09/2014   CLINICAL DATA:  Asthma, assess for pneumonia.  EXAM: CHEST  2 VIEW  COMPARISON:  Chest radiograph December 31, 2013  FINDINGS: Cardiomediastinal silhouette is unremarkable. The lungs are clear without pleural effusions or focal consolidations. Trachea projects midline and there is no pneumothorax. Soft tissue planes and included osseous structures are non-suspicious.  IMPRESSION: No active cardiopulmonary disease.   Electronically Signed   By: Awilda Metro   On: 01/09/2014 04:02  EKG Interpretation None      MDM   Final diagnoses:  Status asthmaticus, mild persistent    Pt comes in with wheezing. Given multiple tx, and better. Suspect asthma exacerbation. Albuterol inhaler given. F.u info and return precautions given.  Derwood Kaplan, MD 01/10/14 1008

## 2014-01-25 ENCOUNTER — Emergency Department (HOSPITAL_COMMUNITY)
Admission: EM | Admit: 2014-01-25 | Discharge: 2014-01-25 | Disposition: A | Discharge status: Home / Self Care | Payer: Self-pay | Attending: Emergency Medicine | Admitting: Emergency Medicine

## 2014-01-25 ENCOUNTER — Emergency Department (HOSPITAL_COMMUNITY): Payer: Self-pay

## 2014-01-25 ENCOUNTER — Encounter (HOSPITAL_COMMUNITY): Payer: Self-pay | Admitting: Emergency Medicine

## 2014-01-25 DIAGNOSIS — R0682 Tachypnea, not elsewhere classified: Secondary | ICD-10-CM | POA: Insufficient documentation

## 2014-01-25 DIAGNOSIS — J45901 Unspecified asthma with (acute) exacerbation: Secondary | ICD-10-CM | POA: Insufficient documentation

## 2014-01-25 DIAGNOSIS — Z8659 Personal history of other mental and behavioral disorders: Secondary | ICD-10-CM | POA: Insufficient documentation

## 2014-01-25 DIAGNOSIS — Z880 Allergy status to penicillin: Secondary | ICD-10-CM | POA: Insufficient documentation

## 2014-01-25 DIAGNOSIS — Z79899 Other long term (current) drug therapy: Secondary | ICD-10-CM | POA: Insufficient documentation

## 2014-01-25 LAB — CBC WITH DIFFERENTIAL/PLATELET
Basophils Absolute: 0 10*3/uL (ref 0.0–0.1)
Basophils Relative: 0 % (ref 0–1)
EOS PCT: 10 % — AB (ref 0–5)
Eosinophils Absolute: 0.9 10*3/uL — ABNORMAL HIGH (ref 0.0–0.7)
HEMATOCRIT: 41.7 % (ref 39.0–52.0)
Hemoglobin: 14.5 g/dL (ref 13.0–17.0)
LYMPHS ABS: 2.9 10*3/uL (ref 0.7–4.0)
Lymphocytes Relative: 33 % (ref 12–46)
MCH: 30.9 pg (ref 26.0–34.0)
MCHC: 34.8 g/dL (ref 30.0–36.0)
MCV: 88.7 fL (ref 78.0–100.0)
Monocytes Absolute: 0.6 10*3/uL (ref 0.1–1.0)
Monocytes Relative: 7 % (ref 3–12)
NEUTROS PCT: 50 % (ref 43–77)
Neutro Abs: 4.5 10*3/uL (ref 1.7–7.7)
Platelets: 308 10*3/uL (ref 150–400)
RBC: 4.7 MIL/uL (ref 4.22–5.81)
RDW: 12.5 % (ref 11.5–15.5)
WBC: 9 10*3/uL (ref 4.0–10.5)

## 2014-01-25 LAB — BASIC METABOLIC PANEL
Anion gap: 16 — ABNORMAL HIGH (ref 5–15)
BUN: 10 mg/dL (ref 6–23)
CO2: 21 mEq/L (ref 19–32)
Calcium: 8.7 mg/dL (ref 8.4–10.5)
Chloride: 103 mEq/L (ref 96–112)
Creatinine, Ser: 0.85 mg/dL (ref 0.50–1.35)
GFR calc Af Amer: >90 mL/min (ref >90)
GFR calc non Af Amer: >90 mL/min (ref >90)
GLUCOSE: 100 mg/dL — AB (ref 70–99)
POTASSIUM: 3.7 meq/L (ref 3.7–5.3)
Sodium: 140 mEq/L (ref 137–147)

## 2014-01-25 MED ORDER — MAGNESIUM SULFATE 40 MG/ML IJ SOLN
2.0000 g | Freq: Once | INTRAMUSCULAR | Status: AC
  Administered 2014-01-25: 2 g via INTRAVENOUS
  Filled 2014-01-25: qty 50

## 2014-01-25 MED ORDER — PREDNISONE 20 MG PO TABS: ORAL_TABLET | ORAL | Status: DC

## 2014-01-25 MED ORDER — IPRATROPIUM-ALBUTEROL 0.5-2.5 (3) MG/3ML IN SOLN
3.0000 mL | RESPIRATORY_TRACT | Status: AC
Start: 2014-01-25 — End: 2014-01-25
  Administered 2014-01-25 (×3): 3 mL via RESPIRATORY_TRACT
  Filled 2014-01-25 (×3): qty 3

## 2014-01-25 MED ORDER — DEXAMETHASONE SODIUM PHOSPHATE 10 MG/ML IJ SOLN
10.0000 mg | Freq: Once | INTRAMUSCULAR | Status: AC
  Administered 2014-01-25: 10 mg via INTRAMUSCULAR
  Filled 2014-01-25: qty 1

## 2014-01-25 MED ORDER — SODIUM CHLORIDE 0.9 % IV BOLUS (SEPSIS)
1000.0000 mL | Freq: Once | INTRAVENOUS | Status: AC
  Administered 2014-01-25: 1000 mL via INTRAVENOUS

## 2014-01-25 MED ORDER — ALBUTEROL SULFATE HFA 108 (90 BASE) MCG/ACT IN AERS
2.0000 | INHALATION_SPRAY | Freq: Once | RESPIRATORY_TRACT | Status: AC
  Administered 2014-01-25: 2 via RESPIRATORY_TRACT
  Filled 2014-01-25: qty 6.7

## 2014-01-25 MED ORDER — ALBUTEROL (5 MG/ML) CONTINUOUS INHALATION SOLN
10.0000 mg/h | INHALATION_SOLUTION | RESPIRATORY_TRACT | Status: DC
  Administered 2014-01-25: 10 mg/h via RESPIRATORY_TRACT
  Filled 2014-01-25: qty 20

## 2014-01-25 NOTE — ED Notes (Signed)
Gave pt some ice water 

## 2014-01-25 NOTE — ED Notes (Signed)
Patient walked good stats stay at 92 to 93 room air heart rate at 112

## 2014-01-25 NOTE — ED Notes (Signed)
Pt arrived by [facility]. Hx of asthma and increase in sob this am 0100. ems came out and gave neb tx at 0100 which temporarily helped, then return of symptoms around 0400. Received neb tx x 2 pta and 125mg  solumedrol pta.

## 2014-01-25 NOTE — Discharge Instructions (Signed)
Asthma Mr. Devin [name], he was seen today for an asthma attack. Your urine breathing treatments, steroids, and IV fluids. Her chest x-ray did not show any pneumonia. Continue to take steroids for the next 4 days and followup with your primary care provider for continued treatment. If any symptoms worsen or he develop worsening shortness of breath come back to the emergency department immediately for evaluation. Thank you. Asthma is a condition of the lungs in which the airways tighten and narrow. Asthma can make it hard to breathe. Asthma cannot be cured, but medicine and lifestyle changes can help control it. Asthma may be started (triggered) by:  Animal skin flakes (dander).  Dust.  Cockroaches.  Pollen.  Mold.  Smoke.  Cleaning products.  Hair sprays or aerosol sprays.  Paint fumes or strong smells.  Cold air, weather changes, and winds.  Crying or laughing hard.  Stress.  Certain medicines or drugs.  Foods, such as dried fruit, potato chips, and sparkling grape juice.  Infections or conditions (colds, flu).  Exercise.  Certain medical conditions or diseases.  Exercise or tiring activities. HOME CARE   Take medicine as told by your doctor.  Use a peak flow meter as told by your doctor. A peak flow meter is a tool that measures how well the lungs are working.  Record and keep track of the peak flow meter's readings.  Understand and use the asthma action plan. An asthma action plan is a written plan for taking care of your asthma and treating your attacks.  To help prevent asthma attacks:  Do not smoke. Stay away from secondhand smoke.  Change your heating and air conditioning filter often.  Limit your use of fireplaces and wood stoves.  Get rid of pests (such as roaches and mice) and their droppings.  Throw away plants if you see mold on them.  Clean your floors. Dust regularly. Use cleaning products that do not smell.  Have someone vacuum when you are not  home. Use a vacuum cleaner with a HEPA filter if possible.  Replace carpet with wood, tile, or vinyl flooring. Carpet can trap animal skin flakes and dust.  Use allergy-proof pillows, mattress covers, and box spring covers.  Wash bed sheets and blankets every week in hot water and dry them in a dryer.  Use blankets that are made of polyester or cotton.  Clean bathrooms and kitchens with bleach. If possible, have someone repaint the walls in these rooms with mold-resistant paint. Keep out of the rooms that are being cleaned and painted.  Wash hands often. GET HELP IF:  You have make a whistling sound when breaking (wheeze), have shortness of breath, or have a cough even if taking medicine to prevent attacks.  The colored mucus you cough up (sputum) is thicker than usual.  The colored mucus you cough up changes from clear or white to yellow, green, gray, or bloody.  You have problems from the medicine you are taking such as:  A rash.  Itching.  Swelling.  Trouble breathing.  You need reliever medicines more than 2-3 times a week.  Your peak flow measurement is still at 50-79% of your personal best after following the action plan for 1 hour.  You have a fever. GET HELP RIGHT AWAY IF:   You seem to be worse and are not responding to medicine during an asthma attack.  You are short of breath even at rest.  You get short of breath when doing very little activity.  You have trouble eating, drinking, or talking.  You have chest pain.  You have a fast heartbeat.  Your lips or fingernails start to turn blue.  You are light-headed, dizzy, or faint.  Your peak flow is less than 50% of your personal best. MAKE SURE YOU:   Understand these instructions.  Will watch your condition.  Will get help right away if you are not doing well or get worse. Document Released: 09/25/2007 Document Revised: 08/23/2013 Document Reviewed: 11/05/2012 [facility] Patient Information 2015  [place], [place]. This information is not intended to replace advice given to you by your health care provider. Make sure you discuss any questions you have with your health care provider.

## 2014-01-25 NOTE — ED Notes (Signed)
Respiratory at bedside.

## 2014-01-25 NOTE — ED Provider Notes (Signed)
CSN: [id number]     Arrival date & time 01/25/14  0531 History   First MD Initiated Contact with Patient 01/25/14 [phone]     Chief Complaint  Patient presents with  . Asthma  . Shortness of Breath     (Consider location/radiation/quality/duration/timing/severity/associated sxs/prior Treatment) HPI Devin [name] is a 33 y.o. male with past medical history of ADHD and asthma coming in with shortness of breath. Patient states this occurred immediately prior to arrival while sleeping. He has been short of breath and wheezing all week and using his nebulizer with increasing frequency. He states tonight he became acutely more short of breath. He called 911, EMS gave him a nebulized treatment and Solu-Medrol. He denies any recent infections or fever, rhinorrhea.  He does not know her history there was. He does admit to coughing but has been nonproductive. He has no chest pain. Patient has no further complaints.  10 Systems reviewed and are negative for acute change except as noted in the HPI.     Past Medical History  Diagnosis Date  . Asthma   . ADHD (attention deficit hyperactivity disorder)    Past Surgical History  Procedure Laterality Date  . Tympanoplasty     Family History  Problem Relation Age of Onset  . Asthma Mother   . Heart failure Mother   . Hyperlipidemia Mother   . Hypertension Mother   . Migraines Mother   . Coronary artery disease Father   . Heart attack Father    History  Substance Use Topics  . Smoking status: Never Smoker   . Smokeless tobacco: Not on file  . Alcohol Use: Yes     Comment: occasionally    Review of Systems    Allergies  Amoxicillin-pot clavulanate and Other  Home Medications   Prior to Admission medications   Medication Sig Start Date End Date Taking? Authorizing Provider  albuterol (PROVENTIL) (2.5 MG/3ML) 0.083% nebulizer solution Take 3 mLs (2.5 mg total) by nebulization every 4 (four) hours as needed for wheezing or shortness of  breath. 12/31/13  Yes Nicole Pisciotta, PA-C   BP 138/89  Pulse 91  Temp(Src) 97.7 F (36.5 C) (Oral)  Resp 18  SpO2 98% Physical Exam  Nursing note and vitals reviewed. Constitutional: He is oriented to person, place, and time. Vital signs are normal. He appears well-developed and well-nourished.  Non-toxic appearance. He does not appear ill. No distress.  HENT:  Head: Normocephalic and atraumatic.  Nose: Nose normal.  Mouth/Throat: Oropharynx is clear and moist. No oropharyngeal exudate.  Eyes: Conjunctivae and EOM are normal. Pupils are equal, round, and reactive to light. No scleral icterus.  Neck: Normal range of motion. Neck supple. No tracheal deviation, no edema, no erythema and normal range of motion present. No mass and no thyromegaly present.  Cardiovascular: Normal rate, regular rhythm, S1 normal, S2 normal, normal heart sounds, intact distal pulses and normal pulses.  Exam reveals no gallop and no friction rub.   No murmur heard. Pulses:      Radial pulses are 2+ on the right side, and 2+ on the left side.       Dorsalis pedis pulses are 2+ on the right side, and 2+ on the left side.  Pulmonary/Chest: He is in respiratory distress. He has wheezes. He has no rhonchi. He has no rales.  There is tachypnea and increased work of breathing. Inspiratory and expiratory wheezes heard bilaterally. No use of accessory muscles noted.  Abdominal: Soft. Normal appearance and  bowel sounds are normal. He exhibits no distension, no ascites and no mass. There is no hepatosplenomegaly. There is no tenderness. There is no rebound, no guarding and no CVA tenderness.  Musculoskeletal: Normal range of motion. He exhibits no edema and no tenderness.  Lymphadenopathy:    He has no cervical adenopathy.  Neurological: He is alert and oriented to person, place, and time. He has normal strength. No cranial nerve deficit or sensory deficit. GCS eye subscore is 4. GCS verbal subscore is 5. GCS motor  subscore is 6.  Skin: Skin is warm, dry and intact. No petechiae and no rash noted. He is not diaphoretic. No erythema. No pallor.  Psychiatric: He has a normal mood and affect. His behavior is normal. Judgment normal.    ED Course  Procedures (including critical care time) Labs Review Labs Reviewed  CBC WITH DIFFERENTIAL - Abnormal; Notable for the following:    Eosinophils Relative 10 (*)    Eosinophils Absolute 0.9 (*)    All other components within normal limits  BASIC METABOLIC PANEL - Abnormal; Notable for the following:    Glucose, Bld 100 (*)    Anion gap 16 (*)    All other components within normal limits    Imaging Review Dg Chest 2 View  01/25/2014   CLINICAL DATA:  Asthma with acute onset shortness of breath and cough  EXAM: CHEST  2 VIEW  COMPARISON:  January 09, 2014  FINDINGS: Lungs are clear. Heart size and pulmonary vascularity are normal. No adenopathy. No bone lesions.  IMPRESSION: No edema or consolidation.   Electronically Signed   By: Bretta BangWilliam  Woodruff M.D.   On: 01/25/2014 07:01     EKG Interpretation None      MDM   Final diagnoses:  None    Patient presents emergency Department with a history of asthma, complaining of shortness of breath and wheezing. He was given breathing treatment in the emergency department as well as IV fluids and magnesium. Will obtain chest x-ray to evaluate for pneumonia as he has had this in the past. Patient has not had any episodes of hypoxia per EMS or in the emergency department.  Upon my repeat evaluation the patient appears much more comfortable. He is still wheezing on exam. There is no tachypnea and there is no respiratory distress. I have ordered a continuous albuterol treatment. Patient was signed out to Dr. Radford PaxBeaton. Please see his note for ultimate disposition of this patient. It is my suspicion that he will be able to go home with a prednisone course. Chest x-ray was negative for pneumonia.    Tomasita CrumbleAdeleke Kassadie Pancake,  MD 01/25/14 [phone]

## 2014-01-25 NOTE — ED Notes (Signed)
Patient transported to X-ray 

## 2014-01-28 NOTE — Discharge Planning (Signed)
[facility]ist was not able to see patient, [facility] orange card information and resources will be sent to the address provided. °

## 2014-02-21 ENCOUNTER — Emergency Department (HOSPITAL_COMMUNITY)
Admission: EM | Admit: 2014-02-21 | Discharge: 2014-02-21 | Disposition: A | Discharge status: Home / Self Care | Payer: Self-pay | Attending: Emergency Medicine | Admitting: Emergency Medicine

## 2014-02-21 ENCOUNTER — Encounter (HOSPITAL_COMMUNITY): Payer: Self-pay | Admitting: Emergency Medicine

## 2014-02-21 DIAGNOSIS — Z8659 Personal history of other mental and behavioral disorders: Secondary | ICD-10-CM | POA: Insufficient documentation

## 2014-02-21 DIAGNOSIS — J45901 Unspecified asthma with (acute) exacerbation: Secondary | ICD-10-CM | POA: Insufficient documentation

## 2014-02-21 DIAGNOSIS — Z79899 Other long term (current) drug therapy: Secondary | ICD-10-CM | POA: Insufficient documentation

## 2014-02-21 DIAGNOSIS — Z7952 Long term (current) use of systemic steroids: Secondary | ICD-10-CM | POA: Insufficient documentation

## 2014-02-21 DIAGNOSIS — Z880 Allergy status to penicillin: Secondary | ICD-10-CM | POA: Insufficient documentation

## 2014-02-21 MED ORDER — IPRATROPIUM-ALBUTEROL 0.5-2.5 (3) MG/3ML IN SOLN
3.0000 mL | Freq: Once | RESPIRATORY_TRACT | Status: AC
  Administered 2014-02-21: 3 mL via RESPIRATORY_TRACT
  Filled 2014-02-21: qty 3

## 2014-02-21 MED ORDER — PREDNISONE 20 MG PO TABS: 60.0000 mg | ORAL_TABLET | Freq: Every day | ORAL | Status: DC

## 2014-02-21 MED ORDER — ALBUTEROL SULFATE HFA 108 (90 BASE) MCG/ACT IN AERS
2.0000 | INHALATION_SPRAY | Freq: Once | RESPIRATORY_TRACT | Status: AC
  Administered 2014-02-21: 2 via RESPIRATORY_TRACT
  Filled 2014-02-21: qty 6.7

## 2014-02-21 MED ORDER — ALBUTEROL SULFATE (2.5 MG/3ML) 0.083% IN NEBU: 2.5000 mg | INHALATION_SOLUTION | RESPIRATORY_TRACT | Status: DC | PRN

## 2014-02-21 NOTE — Discharge Instructions (Signed)
Asthma °Asthma is a recurring condition in which the airways tighten and narrow. Asthma can make it difficult to breathe. It can cause coughing, wheezing, and shortness of breath. Asthma episodes, also called asthma attacks, range from minor to life-threatening. Asthma cannot be cured, but medicines and lifestyle changes can help control it. °CAUSES °Asthma is believed to be caused by inherited (genetic) and environmental factors, but its exact cause is unknown. Asthma may be triggered by allergens, lung infections, or irritants in the air. Asthma triggers are different for each person. Common triggers include:  °· Animal dander. °· Dust mites. °· Cockroaches. °· Pollen from trees or grass. °· Mold. °· Smoke. °· Air pollutants such as dust, household cleaners, hair sprays, aerosol sprays, paint fumes, strong chemicals, or strong odors. °· Cold air, weather changes, and winds (which increase molds and pollens in the air). °· Strong emotional expressions such as crying or laughing hard. °· Stress. °· Certain medicines (such as aspirin) or types of drugs (such as beta-blockers). °· Sulfites in foods and drinks. Foods and drinks that may contain sulfites include dried fruit, potato chips, and sparkling grape juice. °· Infections or inflammatory conditions such as the flu, a cold, or an inflammation of the nasal membranes (rhinitis). °· Gastroesophageal reflux disease (GERD). °· Exercise or strenuous activity. °SYMPTOMS °Symptoms may occur immediately after asthma is triggered or many hours later. Symptoms include: °· Wheezing. °· Excessive nighttime or early morning coughing. °· Frequent or severe coughing with a common cold. °· Chest tightness. °· Shortness of breath. °DIAGNOSIS  °The diagnosis of asthma is made by a review of your medical history and a physical exam. Tests may also be performed. These may include: °· Lung function studies. These tests show how much air you breathe in and out. °· Allergy  tests. °· Imaging tests such as X-rays. °TREATMENT  °Asthma cannot be cured, but it can usually be controlled. Treatment involves identifying and avoiding your asthma triggers. It also involves medicines. There are 2 classes of medicine used for asthma treatment:  °· Controller medicines. These prevent asthma symptoms from occurring. They are usually taken every day. °· Reliever or rescue medicines. These quickly relieve asthma symptoms. They are used as needed and provide short-term relief. °Your health care provider will help you create an asthma action plan. An asthma action plan is a written plan for managing and treating your asthma attacks. It includes a list of your asthma triggers and how they may be avoided. It also includes information on when medicines should be taken and when their dosage should be changed. An action plan may also involve the use of a device called a peak flow meter. A peak flow meter measures how well the lungs are working. It helps you monitor your condition. °HOME CARE INSTRUCTIONS  °· Take medicines only as directed by your health care provider. Speak with your health care provider if you have questions about how or when to take the medicines. °· Use a peak flow meter as directed by your health care provider. Record and keep track of readings. °· Understand and use the action plan to help minimize or stop an asthma attack without needing to seek medical care. °· Control your home environment in the following ways to help prevent asthma attacks: °¨ Do not smoke. Avoid being exposed to secondhand smoke. °¨ Change your heating and air conditioning filter regularly. °¨ Limit your use of fireplaces and wood stoves. °¨ Get rid of pests (such as roaches and   mice) and their droppings. °¨ Throw away plants if you see mold on them. °¨ Clean your floors and dust regularly. Use unscented cleaning products. °¨ Try to have someone else vacuum for you regularly. Stay out of rooms while they are  being vacuumed and for a short while afterward. If you vacuum, use a dust mask from a hardware store, a double-layered or microfilter vacuum cleaner bag, or a vacuum cleaner with a HEPA filter. °¨ Replace carpet with wood, tile, or vinyl flooring. Carpet can trap dander and dust. °¨ Use allergy-proof pillows, mattress covers, and box spring covers. °¨ Wash bed sheets and blankets every week in hot water and dry them in a dryer. °¨ Use blankets that are made of polyester or cotton. °¨ Clean bathrooms and kitchens with bleach. If possible, have someone repaint the walls in these rooms with mold-resistant paint. Keep out of the rooms that are being cleaned and painted. °¨ Wash hands frequently. °SEEK MEDICAL CARE IF:  °· You have wheezing, shortness of breath, or a cough even if taking medicine to prevent attacks. °· The colored mucus you cough up (sputum) is thicker than usual. °· Your sputum changes from clear or white to yellow, green, gray, or bloody. °· You have any problems that may be related to the medicines you are taking (such as a rash, itching, swelling, or trouble breathing). °· You are using a reliever medicine more than 2-3 times per week. °· Your peak flow is still at 50-79% of your personal best after following your action plan for 1 hour. °· You have a fever. °SEEK IMMEDIATE MEDICAL CARE IF:  °· You seem to be getting worse and are unresponsive to treatment during an asthma attack. °· You are short of breath even at rest. °· You get short of breath when doing very little physical activity. °· You have difficulty eating, drinking, or talking due to asthma symptoms. °· You develop chest pain. °· You develop a fast heartbeat. °· You have a bluish color to your lips or fingernails. °· You are light-headed, dizzy, or faint. °· Your peak flow is less than 50% of your personal best. °MAKE SURE YOU:  °· Understand these instructions. °· Will watch your condition. °· Will get help right away if you are not  doing well or get worse. °Document Released: 04/08/2005 Document Revised: 08/23/2013 Document Reviewed: 11/05/2012 °[employer]® Patient Information ©2015 [employer]. This information is not intended to replace advice given to you by your health care provider. Make sure you discuss any questions you have with your health care provider. ° °Prednisone tablets °What is this medicine? °PREDNISONE (PRED ni sone) is a corticosteroid. It is commonly used to treat inflammation of the skin, joints, lungs, and other organs. Common conditions treated include asthma, allergies, and arthritis. It is also used for other conditions, such as blood disorders and diseases of the adrenal glands. °This medicine may be used for other purposes; ask your health care provider or pharmacist if you have questions. °COMMON BRAND NAME(S): Deltasone, Predone, Sterapred, Sterapred DS °What should I tell my health care provider before I take this medicine? °They need to know if you have any of these conditions: °-Cushing's syndrome °-diabetes °-glaucoma °-heart disease °-high blood pressure °-infection (especially a virus infection such as chickenpox, cold sores, or herpes) °-kidney disease °-liver disease °-mental illness °-myasthenia gravis °-osteoporosis °-seizures °-stomach or intestine problems °-thyroid disease °-an unusual or allergic reaction to lactose, prednisone, other medicines, foods, dyes, or preservatives °-pregnant   or trying to get pregnant -breast-feeding How should I use this medicine? Take this medicine by mouth with a glass of water. Follow the directions on the prescription label. Take this medicine with food. If you are taking this medicine once a day, take it in the morning. Do not take more medicine than you are told to take. Do not suddenly stop taking your medicine because you may develop a severe reaction. Your doctor will tell you how much medicine to take. If your doctor wants you to stop the medicine, the dose may  be slowly lowered over time to avoid any side effects. Talk to your pediatrician regarding the use of this medicine in children. Special care may be needed. Overdosage: If you think you have taken too much of this medicine contact a poison control center or emergency room at once. NOTE: This medicine is only for you. Do not share this medicine with others. What if I miss a dose? If you miss a dose, take it as soon as you can. If it is almost time for your next dose, talk to your doctor or health care professional. You may need to miss a dose or take an extra dose. Do not take double or extra doses without advice. What may interact with this medicine? Do not take this medicine with any of the following medications: -metyrapone -mifepristone This medicine may also interact with the following medications: -aminoglutethimide -amphotericin B -aspirin and aspirin-like medicines -barbiturates -certain medicines for diabetes, like glipizide or glyburide -cholestyramine -cholinesterase inhibitors -cyclosporine -digoxin -diuretics -ephedrine -male hormones, like estrogens and birth control pills -isoniazid -ketoconazole -NSAIDS, medicines for pain and inflammation, like ibuprofen or naproxen -phenytoin -rifampin -toxoids -vaccines -warfarin This list may not describe all possible interactions. Give your health care provider a list of all the medicines, herbs, non-prescription drugs, or dietary supplements you use. Also tell them if you smoke, drink alcohol, or use illegal drugs. Some items may interact with your medicine. What should I watch for while using this medicine? Visit your doctor or health care professional for regular checks on your progress. If you are taking this medicine over a prolonged period, carry an identification card with your name and address, the type and dose of your medicine, and your doctor's name and address. This medicine may increase your risk of getting an  infection. Tell your doctor or health care professional if you are around anyone with measles or chickenpox, or if you develop sores or blisters that do not heal properly. If you are going to have surgery, tell your doctor or health care professional that you have taken this medicine within the last twelve months. Ask your doctor or health care professional about your diet. You may need to lower the amount of salt you eat. This medicine may affect blood sugar levels. If you have diabetes, check with your doctor or health care professional before you change your diet or the dose of your diabetic medicine. What side effects may I notice from receiving this medicine? Side effects that you should report to your doctor or health care professional as soon as possible: -allergic reactions like skin rash, itching or hives, swelling of the face, lips, or tongue -changes in emotions or moods -changes in vision -depressed mood -eye pain -fever or chills, cough, sore throat, pain or difficulty passing urine -increased thirst -swelling of ankles, feet Side effects that usually do not require medical attention (report to your doctor or health care professional if they continue or are  bothersome): -confusion, excitement, restlessness -headache -nausea, vomiting -skin problems, acne, thin and shiny skin -trouble sleeping -weight gain This list may not describe all possible side effects. Call your doctor for medical advice about side effects. You may report side effects to FDA at 1-800-FDA-1088. Where should I keep my medicine? Keep out of the reach of children. Store at room temperature between 15 and 30 degrees C (59 and 86 degrees F). Protect from light. Keep container tightly closed. Throw away any unused medicine after the expiration date. NOTE: This sheet is a summary. It may not cover all possible information. If you have questions about this medicine, talk to your doctor, pharmacist, or health care  provider.  2015, [employer] Standard. (2010-11-22 10:57:14)  Albuterol inhalation solution What is this medicine? ALBUTEROL (al [address] ole) is a bronchodilator. It helps to open up the airways in your lungs to make it easier to breathe. This medicine is used to treat and to prevent bronchospasm. This medicine may be used for other purposes; ask your health care provider or pharmacist if you have questions. COMMON BRAND NAME(S): Accuneb, Proventil What should I tell my health care provider before I take this medicine? They need to know if you have any of the following conditions: -diabetes -heart disease or irregular heartbeat -high blood pressure -pheochromocytoma -seizures -thyroid disease -an unusual or allergic reaction to albuterol, levalbuterol, sulfites, other medicines, foods, dyes, or preservatives -pregnant or trying to get pregnant -breast-feeding How should I use this medicine? This medicine is used in a nebulizer. Nebulizers make a liquid into an aerosol that you breathe in through your mouth or your mouth and nose into your lungs. You will be taught how to use your nebulizer. Follow the directions on your prescription label. Take your medicine at regular intervals. Do not use more often than directed. Talk to your pediatrician regarding the use of this medicine in children. Special care may be needed. Overdosage: If you think you have taken too much of this medicine contact a poison control center or emergency room at once. NOTE: This medicine is only for you. Do not share this medicine with others. What if I miss a dose? If you miss a dose, use it as soon as you can. If it is almost time for your next dose, use only that dose. Do not use double or extra doses. What may interact with this medicine? -anti-infectives like chloroquine and pentamidine -caffeine -cisapride -diuretics -medicines for colds -medicines for depression or emotional or psychotic  conditions -medicines for weight loss including some herbal products -methadone -some antibiotics like clarithromycin, erythromycin, levofloxacin, and linezolid -some heart medicines -steroid hormones like dexamethasone, cortisone, hydrocortisone -theophylline -thyroid hormones This list may not describe all possible interactions. Give your health care provider a list of all the medicines, herbs, non-prescription drugs, or dietary supplements you use. Also tell them if you smoke, drink alcohol, or use illegal drugs. Some items may interact with your medicine. What should I watch for while using this medicine? Tell your doctor or health care professional if your symptoms do not improve. Do not use extra albuterol. Call your doctor right away if your asthma or bronchitis gets worse while you are using this medicine. If your mouth gets dry try chewing sugarless gum or sucking hard candy. Drink water as directed. What side effects may I notice from receiving this medicine? Side effects that you should report to your doctor or health care professional as soon as possible: -allergic reactions like skin  rash, itching or hives, swelling of the face, lips, or tongue -breathing problems -chest pain -feeling faint or lightheaded, falls -high blood pressure -irregular heartbeat -fever -muscle cramps or weakness -pain, tingling, numbness in the hands or feet -vomiting Side effects that usually do not require medical attention (report to your doctor or health care professional if they continue or are bothersome): -cough -difficulty sleeping -headache -nervousness, trembling -stomach upset -stuffy or runny nose -throat irritation -unusual taste This list may not describe all possible side effects. Call your doctor for medical advice about side effects. You may report side effects to FDA at 1-800-FDA-1088. Where should I keep my medicine? Keep out of the reach of children. Store between 2 and 25  degrees C (36 and 77 degrees F). Do not freeze. Protect from light. Throw away any unused medicine after the expiration date. Most products are kept in the foil package until time of use. Some products can be used up to 1 week after they are removed from the foil pouch. Check the instructions that come with your medicine. NOTE: This sheet is a summary. It may not cover all possible information. If you have questions about this medicine, talk to your doctor, pharmacist, or health care provider.  2015, [employer] Standard. (2010-12-28 15:19:55)

## 2014-02-21 NOTE — ED Notes (Signed)
Patient had an asthma attack this evening at 8pm, called EMS and received 2 breathing treatments. Patient was still wheezing after treatments, but did not come to hospital. Patient went to bed and woke at 3:00am and wheezing was worse. Patient called EMS again and was transported to facility. Patient received 10mg  of Albuterol, 125 of solumedrol en route. HR 88 RA sat of 96%

## 2014-02-21 NOTE — ED Provider Notes (Signed)
CSN: [id number]     Arrival date & time 02/21/14  0446 History   First MD Initiated Contact with Patient 02/21/14 [phone]     Chief complaint: Asthma  (Consider location/radiation/quality/duration/timing/severity/associated sxs/prior Treatment) The history is provided by the patient.  33 year old male with a history of asthma states that he had an asthma attack last night at about 8:30 PM which was improved slightly with home nebulizer. That was his last dose of albuterol that he had for his nebulizer. He had recurrence of asthma at about 2:30. He did not have any medication to take for it, so he called for an ambulance. EMS has given methylprednisolone and 2 nebulizer treatments with albuterol with significant but not complete improvement. There has been a nonproductive cough. He denies fever, chills, sweats. He states he has had to be admitted to the hospital for his asthma but has not had to be intubated. He does not have a PCP currently.  Past Medical History  Diagnosis Date  . Asthma   . ADHD (attention deficit hyperactivity disorder)    Past Surgical History  Procedure Laterality Date  . Tympanoplasty     Family History  Problem Relation Age of Onset  . Asthma Mother   . Heart failure Mother   . Hyperlipidemia Mother   . Hypertension Mother   . Migraines Mother   . Coronary artery disease Father   . Heart attack Father    History  Substance Use Topics  . Smoking status: Never Smoker   . Smokeless tobacco: Not on file  . Alcohol Use: Yes     Comment: occasionally    Review of Systems  All other systems reviewed and are negative.     Allergies  Amoxicillin-pot clavulanate and Other  Home Medications   Prior to Admission medications   Medication Sig Start Date End Date Taking? Authorizing Provider  albuterol (PROVENTIL) (2.5 MG/3ML) 0.083% nebulizer solution Take 3 mLs (2.5 mg total) by nebulization every 4 (four) hours as needed for wheezing or shortness of breath.  12/31/13   Nicole Pisciotta, PA-C  predniSONE (DELTASONE) 20 MG tablet 3 tabs po day one, then 2 tabs daily x 4 days 01/25/14   Tomasita CrumbleAdeleke Oni, MD   BP 97/67 mmHg  Pulse 70  Resp 15  SpO2 96% Physical Exam  Nursing note and vitals reviewed.  33 year old male, resting comfortably and in no acute distress. Vital signs are normal. Oxygen saturation is 86%, which is normal. Head is normocephalic and atraumatic. PERRLA, EOMI. Oropharynx is clear. Neck is nontender and supple without adenopathy or JVD. Back is nontender and there is no CVA tenderness. Lungs have mild expiratory wheezes without ralesor rhonchi. Chest is nontender. Heart has regular rate and rhythm without murmur. Abdomen is soft, flat, nontender without masses or hepatosplenomegaly and peristalsis is normoactive. Extremities have no cyanosis or edema, full range of motion is present. Skin is warm and dry without rash. Neurologic: Mental status is normal, cranial nerves are intact, there are no motor or sensory deficits.  ED Course  Procedures (including critical care time)   MDM   Final diagnoses:  Asthma exacerbation    Exacerbation of asthma.and he'll be given a third nebulizer treatment. Her records are reviewed showing multiple ED visits for asthma and most recent hospitalization in 2013.  Following the third nebulizer treatment, patient is resting comfortably maintaining adequate oxygen saturation. He is discharged with prescription for prednisone and albuterol solution for his nebulizer.  Dione Boozeavid Rickayla Wieland, MD 02/21/14  0648 

## 2014-03-07 ENCOUNTER — Encounter (HOSPITAL_COMMUNITY): Payer: Self-pay | Admitting: *Deleted

## 2014-03-07 ENCOUNTER — Emergency Department (HOSPITAL_COMMUNITY)
Admission: EM | Admit: 2014-03-07 | Discharge: 2014-03-07 | Disposition: A | Discharge status: Home / Self Care | Payer: Self-pay | Attending: Emergency Medicine | Admitting: Emergency Medicine

## 2014-03-07 DIAGNOSIS — Z7951 Long term (current) use of inhaled steroids: Secondary | ICD-10-CM | POA: Insufficient documentation

## 2014-03-07 DIAGNOSIS — Z880 Allergy status to penicillin: Secondary | ICD-10-CM | POA: Insufficient documentation

## 2014-03-07 DIAGNOSIS — Z8659 Personal history of other mental and behavioral disorders: Secondary | ICD-10-CM | POA: Insufficient documentation

## 2014-03-07 DIAGNOSIS — Z79899 Other long term (current) drug therapy: Secondary | ICD-10-CM | POA: Insufficient documentation

## 2014-03-07 DIAGNOSIS — Z7952 Long term (current) use of systemic steroids: Secondary | ICD-10-CM | POA: Insufficient documentation

## 2014-03-07 DIAGNOSIS — J45901 Unspecified asthma with (acute) exacerbation: Secondary | ICD-10-CM | POA: Insufficient documentation

## 2014-03-07 MED ORDER — IPRATROPIUM-ALBUTEROL 0.5-2.5 (3) MG/3ML IN SOLN
3.0000 mL | Freq: Once | RESPIRATORY_TRACT | Status: AC
  Administered 2014-03-07: 3 mL via RESPIRATORY_TRACT
  Filled 2014-03-07: qty 3

## 2014-03-07 MED ORDER — PREDNISONE 50 MG PO TABS: 50.0000 mg | ORAL_TABLET | Freq: Every day | ORAL | Status: DC

## 2014-03-07 MED ORDER — ALBUTEROL SULFATE HFA 108 (90 BASE) MCG/ACT IN AERS
2.0000 | INHALATION_SPRAY | RESPIRATORY_TRACT | Status: DC | PRN
  Administered 2014-03-07: 2 via RESPIRATORY_TRACT
  Filled 2014-03-07: qty 6.7

## 2014-03-07 MED ORDER — IPRATROPIUM-ALBUTEROL 0.5-2.5 (3) MG/3ML IN SOLN
RESPIRATORY_TRACT | Status: AC
  Administered 2014-03-07: 3 mL
  Filled 2014-03-07: qty 3

## 2014-03-07 MED ORDER — BUDESONIDE 180 MCG/ACT IN AEPB: 2.0000 | INHALATION_SPRAY | Freq: Every day | RESPIRATORY_TRACT | Status: DC

## 2014-03-07 MED ORDER — IPRATROPIUM-ALBUTEROL 0.5-2.5 (3) MG/3ML IN SOLN: 3.0000 mL | Freq: Once | RESPIRATORY_TRACT | Status: AC

## 2014-03-07 MED ORDER — ALBUTEROL SULFATE (2.5 MG/3ML) 0.083% IN NEBU: 2.5000 mg | INHALATION_SOLUTION | Freq: Once | RESPIRATORY_TRACT | Status: AC

## 2014-03-07 MED ORDER — ALBUTEROL SULFATE (2.5 MG/3ML) 0.083% IN NEBU
INHALATION_SOLUTION | RESPIRATORY_TRACT | Status: AC
  Administered 2014-03-07: 2.5 mg
  Filled 2014-03-07: qty 3

## 2014-03-07 MED ORDER — ALBUTEROL SULFATE (2.5 MG/3ML) 0.083% IN NEBU
5.0000 mg | INHALATION_SOLUTION | Freq: Once | RESPIRATORY_TRACT | Status: AC
  Administered 2014-03-07: 5 mg via RESPIRATORY_TRACT
  Filled 2014-03-07: qty 6

## 2014-03-07 MED ORDER — IPRATROPIUM-ALBUTEROL 0.5-2.5 (3) MG/3ML IN SOLN
3.0000 mL | Freq: Once | RESPIRATORY_TRACT | Status: DC
  Filled 2014-03-07: qty 3

## 2014-03-07 MED ORDER — IPRATROPIUM BROMIDE 0.02 % IN SOLN
0.5000 mg | Freq: Once | RESPIRATORY_TRACT | Status: AC
  Administered 2014-03-07: 0.5 mg via RESPIRATORY_TRACT
  Filled 2014-03-07: qty 2.5

## 2014-03-07 MED ORDER — ALBUTEROL SULFATE (2.5 MG/3ML) 0.083% IN NEBU: 2.5000 mg | INHALATION_SOLUTION | Freq: Four times a day (QID) | RESPIRATORY_TRACT | Status: DC | PRN

## 2014-03-07 NOTE — ED Notes (Signed)
Pt arrived by [facility] for asthma and sob, no relief with neb tx at home. Has received 10mg  albuterol, 0.5mg  atrovent and 125mg  solumedrol pta, spo2 98% at triage.

## 2014-03-07 NOTE — ED Provider Notes (Signed)
CSN: [id number]     Arrival date & time 03/07/14  1426 History   First MD Initiated Contact with Patient 03/07/14 1821     Chief Complaint  Patient presents with  . Asthma     (Consider location/radiation/quality/duration/timing/severity/associated sxs/prior Treatment) HPI  Pt presenting with c/o wheezing and shortness of breath.  He has hx of asthma and states that over the past 2-3 days he has been having increased shortness of breath.  He used neb treatment at home but then called EMS due to continued shortness of breath.  Per EMS he received albuterol/atrovent and solumedrol.  While waiting to be brought back to room he received a 2 more treatments- last one finished 10-15 minutes ago.  No fever/chills.  No vomiting or change in stools.  No leg swelling.  He was in the ED 2 weeks ago with similar symptoms- states he took the steroids and felt completely improved until a change in the weather several days ago. He has hx of admission in the past, but no inttubations.  Most recent steroids was 2 weeks ago.  He states he has run out of albuterol nebs and MDI and that he is unable to afford controller medications due to having no insurance.  There are no other associated systemic symptoms, there are no other alleviating or modifying factors.   Past Medical History  Diagnosis Date  . Asthma   . ADHD (attention deficit hyperactivity disorder)    Past Surgical History  Procedure Laterality Date  . Tympanoplasty     Family History  Problem Relation Age of Onset  . Asthma Mother   . Heart failure Mother   . Hyperlipidemia Mother   . Hypertension Mother   . Migraines Mother   . Coronary artery disease Father   . Heart attack Father    History  Substance Use Topics  . Smoking status: Never Smoker   . Smokeless tobacco: Not on file  . Alcohol Use: Yes     Comment: occasionally    Review of Systems  ROS reviewed and all otherwise negative except for mentioned in HPI    Allergies   Amoxicillin-pot clavulanate and Other  Home Medications   Prior to Admission medications   Medication Sig Start Date End Date Taking? Authorizing Provider  albuterol (PROVENTIL) (2.5 MG/3ML) 0.083% nebulizer solution Take 3 mLs (2.5 mg total) by nebulization every 4 (four) hours as needed for wheezing or shortness of breath. 02/21/14  Yes Dione Boozeavid Glick, MD  albuterol (PROVENTIL) (2.5 MG/3ML) 0.083% nebulizer solution Take 3 mLs (2.5 mg total) by nebulization every 6 (six) hours as needed for wheezing or shortness of breath. 03/07/14   Ethelda ChickMartha K Linker, MD  budesonide (PULMICORT FLEXHALER) 180 MCG/ACT inhaler Inhale 2 puffs into the lungs daily. 03/07/14   Ethelda ChickMartha K Linker, MD  predniSONE (DELTASONE) 20 MG tablet Take 3 tablets (60 mg total) by mouth daily. 02/21/14   Dione Boozeavid Glick, MD  predniSONE (DELTASONE) 50 MG tablet Take 1 tablet (50 mg total) by mouth daily. 03/07/14   Ethelda ChickMartha K Linker, MD   BP 128/74 mmHg  Pulse 94  Temp(Src) 98.9 F (37.2 C) (Oral)  Resp 19  Ht 5\' 10"  (1.778 m)  Wt 187 lb (84.823 kg)  BMI 26.83 kg/m2  SpO2 96%  Vitals reviewed Physical Exam  Physical Examination: General appearance - alert, well appearing, and in no distress Mental status - alert, oriented to person, place, and time Eyes - no conjunctival injection, no scleral icterus Mouth -  mucous membranes moist, pharynx normal without lesions Chest - bilateral expiratory wheezing- mild, no rales or rhonchi, symmetric air entry Heart - normal rate, regular rhythm, normal S1, S2, no murmurs, rubs, clicks or gallops Abdomen - soft, nontender, nondistended, no masses or organomegaly Extremities - peripheral pulses normal, no pedal edema, no clubbing or cyanosis Skin - normal coloration and turgor, no rashes  ED Course  Procedures (including critical care time) Labs Review Labs Reviewed - No data to display  Imaging Review No results found.   EKG Interpretation None      MDM   Final diagnoses:  Asthma  exacerbation    Pt with hx of asthma, presents with wheezing and cough.  Pt feels improved after 2 nebs in the ED, has also received solumedrol.  After 3rd neb he is much improved, discharged with rx for prednisone, given albuterol MDI as well as albuterol neb solutions which he has run out of.  Discharged with strict return precautions.  Pt agreeable with plan.    Ethelda ChickMartha K Linker, MD 03/07/14 [phone]

## 2014-03-07 NOTE — Progress Notes (Signed)
ED CM noted patient to not have PCP, or health insurance. Pt presented to [facility] ED via EMS with asthma exacerbation .  Spoke with patient and he reports  not having a PCP.  CM discussed with patient on how to obtain an a PCP, and provided verbal and written information on the [facility] and [facility]. Discussed the importance and benefits of establishing care with PCP, and not utilizing the ED for primary care needs.  Reviewed the sign and symptoms of when to seek emergency care. Pt verbalized understanding. Pt was appreciative and is agreement with plan. Updated EDP and RN on DCP.  No further CM needs identified.

## 2014-03-14 ENCOUNTER — Emergency Department (HOSPITAL_COMMUNITY): Payer: Self-pay

## 2014-03-14 ENCOUNTER — Encounter (HOSPITAL_COMMUNITY): Payer: Self-pay | Admitting: Emergency Medicine

## 2014-03-14 ENCOUNTER — Emergency Department (HOSPITAL_COMMUNITY)
Admission: EM | Admit: 2014-03-14 | Discharge: 2014-03-14 | Disposition: A | Discharge status: Home / Self Care | Payer: Self-pay | Attending: Emergency Medicine | Admitting: Emergency Medicine

## 2014-03-14 DIAGNOSIS — Z7951 Long term (current) use of inhaled steroids: Secondary | ICD-10-CM | POA: Insufficient documentation

## 2014-03-14 DIAGNOSIS — Z880 Allergy status to penicillin: Secondary | ICD-10-CM | POA: Insufficient documentation

## 2014-03-14 DIAGNOSIS — Z79899 Other long term (current) drug therapy: Secondary | ICD-10-CM | POA: Insufficient documentation

## 2014-03-14 DIAGNOSIS — Z8659 Personal history of other mental and behavioral disorders: Secondary | ICD-10-CM | POA: Insufficient documentation

## 2014-03-14 DIAGNOSIS — R059 Cough, unspecified: Secondary | ICD-10-CM

## 2014-03-14 DIAGNOSIS — Z7952 Long term (current) use of systemic steroids: Secondary | ICD-10-CM | POA: Insufficient documentation

## 2014-03-14 DIAGNOSIS — R05 Cough: Secondary | ICD-10-CM

## 2014-03-14 DIAGNOSIS — J45901 Unspecified asthma with (acute) exacerbation: Secondary | ICD-10-CM | POA: Insufficient documentation

## 2014-03-14 MED ORDER — ALBUTEROL SULFATE HFA 108 (90 BASE) MCG/ACT IN AERS: 2.0000 | INHALATION_SPRAY | RESPIRATORY_TRACT | Status: DC | PRN

## 2014-03-14 MED ORDER — PREDNISONE 20 MG PO TABS: 40.0000 mg | ORAL_TABLET | Freq: Every day | ORAL | Status: DC

## 2014-03-14 MED ORDER — BUDESONIDE-FORMOTEROL FUMARATE 160-4.5 MCG/ACT IN AERO: 2.0000 | INHALATION_SPRAY | Freq: Every day | RESPIRATORY_TRACT | Status: DC

## 2014-03-14 MED ORDER — ALBUTEROL SULFATE (2.5 MG/3ML) 0.083% IN NEBU
5.0000 mg | INHALATION_SOLUTION | Freq: Once | RESPIRATORY_TRACT | Status: AC
  Administered 2014-03-14: 5 mg via RESPIRATORY_TRACT
  Filled 2014-03-14: qty 6

## 2014-03-14 MED ORDER — ALBUTEROL (5 MG/ML) CONTINUOUS INHALATION SOLN: 10.0000 mg | INHALATION_SOLUTION | RESPIRATORY_TRACT | Status: DC

## 2014-03-14 NOTE — Discharge Instructions (Signed)

## 2014-03-14 NOTE — ED Notes (Signed)
Pt comfortable with discharge and follow up instructions. Pt declines wheelchair, escorted to waiting area by this RN. Prescriptions x3. 

## 2014-03-14 NOTE — ED Provider Notes (Signed)
CSN: [id number]     Arrival date & time 03/14/14  [id number] History   First MD Initiated Contact with Patient 03/14/14 1004     Chief Complaint  Patient presents with  . Shortness of Breath  . Asthma     (Consider location/radiation/quality/duration/timing/severity/associated sxs/prior Treatment) HPI Comments: Patient is a 33 year old male with history of asthma who presents the emergency department for evaluation of wheezing and shortness breath. He reports that he has long-standing history of asthma and had a flare that began around 12:30 AM. He took 2 home nebulizer treatments and initially felt improved. He woke up this morning and felt as though shortness of breath was worse. He took a home breathing treatment, but then felt as if he needed to call EMS. EMS gave an additional 2 breathing treatments for a total of 10 mg of albuterol, 0.5 mg of Atrovent, and he received 125 mg of Solu-Medrol. He has had a cough for the past 2 days. He denies any fevers or chills. He has been admitted for his asthma in the past, but has never been intubated. He was last seen on 11/16 for an asthma exacerbation. He did not fill his prescriptions because he could not afford them. The total cost of his prescriptions were $11. His brother should be paying him back money he owes him by the end of the week.   Patient is a 33 y.o. male presenting with shortness of breath and asthma. The history is provided by the patient. No language interpreter was used.  Shortness of Breath Associated symptoms: cough and wheezing   Associated symptoms: no abdominal pain, no chest pain, no fever and no vomiting   Asthma Associated symptoms include coughing. Pertinent negatives include no abdominal pain, chest pain, chills, fever, nausea or vomiting.    Past Medical History  Diagnosis Date  . Asthma   . ADHD (attention deficit hyperactivity disorder)    Past Surgical History  Procedure Laterality Date  . Tympanoplasty     Family  History  Problem Relation Age of Onset  . Asthma Mother   . Heart failure Mother   . Hyperlipidemia Mother   . Hypertension Mother   . Migraines Mother   . Coronary artery disease Father   . Heart attack Father    History  Substance Use Topics  . Smoking status: Never Smoker   . Smokeless tobacco: Not on file  . Alcohol Use: Yes     Comment: occasionally    Review of Systems  Constitutional: Negative for fever and chills.  Respiratory: Positive for cough, shortness of breath and wheezing.   Cardiovascular: Negative for chest pain.  Gastrointestinal: Negative for nausea, vomiting and abdominal pain.  All other systems reviewed and are negative.     Allergies  Amoxicillin-pot clavulanate and Other  Home Medications   Prior to Admission medications   Medication Sig Start Date End Date Taking? Authorizing Provider  albuterol (PROVENTIL) (2.5 MG/3ML) 0.083% nebulizer solution Take 3 mLs (2.5 mg total) by nebulization every 4 (four) hours as needed for wheezing or shortness of breath. 02/21/14   Dione Boozeavid Glick, MD  albuterol (PROVENTIL) (2.5 MG/3ML) 0.083% nebulizer solution Take 3 mLs (2.5 mg total) by nebulization every 6 (six) hours as needed for wheezing or shortness of breath. 03/07/14   Ethelda ChickMartha K Linker, MD  budesonide (PULMICORT FLEXHALER) 180 MCG/ACT inhaler Inhale 2 puffs into the lungs daily. 03/07/14   Ethelda ChickMartha K Linker, MD  predniSONE (DELTASONE) 20 MG tablet Take 3 tablets (60  mg total) by mouth daily. 02/21/14   Dione Boozeavid Glick, MD  predniSONE (DELTASONE) 50 MG tablet Take 1 tablet (50 mg total) by mouth daily. 03/07/14   Ethelda ChickMartha K Linker, MD   BP 141/89 mmHg  Pulse 86  Temp(Src) 98.4 F (36.9 C) (Oral)  Resp 13  SpO2 97% Physical Exam  Constitutional: He is oriented to person, place, and time. He appears well-developed and well-nourished. No distress.  Speaking in full sentences   HENT:  Head: Normocephalic and atraumatic.  Right Ear: External ear normal.  Left Ear:  External ear normal.  Nose: Nose normal.  Eyes: Conjunctivae are normal.  Neck: Normal range of motion. No tracheal deviation present.  Cardiovascular: Normal rate, regular rhythm and normal heart sounds.   Pulmonary/Chest: Effort normal. No stridor. He has wheezes (diffuse).  Abdominal: Soft. He exhibits no distension. There is no tenderness.  Musculoskeletal: Normal range of motion.  Neurological: He is alert and oriented to person, place, and time.  Skin: Skin is warm and dry. He is not diaphoretic.  Psychiatric: He has a normal mood and affect. His behavior is normal.  Nursing note and vitals reviewed.   ED Course  Procedures (including critical care time) Labs Review Labs Reviewed - No data to display  Imaging Review Dg Chest 2 View  03/14/2014   CLINICAL DATA:  Difficulty breathing ; history of asthma  EXAM: CHEST  2 VIEW  COMPARISON:  January 25, 2014  FINDINGS: Lungs are clear. Heart size and pulmonary vascularity are normal. No adenopathy. No bone lesions.  IMPRESSION: Lungs are clear.   Electronically Signed   By: Bretta BangWilliam  Woodruff M.D.   On: 03/14/2014 11:08     EKG Interpretation None      MDM   Final diagnoses:  Cough  Asthma exacerbation    Patient ambulated in ED with O2 saturations maintained >90, no current signs of respiratory distress. Lung exam improved after nebulizer treatment. Prednisone given in the ED and pt will bd dc with 5 day burst. Pt states they are breathing at baseline. Pt has been instructed to continue using prescribed medications and to speak with PCP about today's exacerbation. [facility]. I spoke with the case manager who recommends patient take his prescriptions [facility] pharmacy [facility] and because he has an appointment, he will be able to fill his medications there. Discussed reasons to return to ED immediately. Vital signs stable for discharge. Patient / Family /  Caregiver informed of clinical course, understand medical decision-making process, and agree with plan.      Mora BellmanHannah S Nil Xiong, PA-C 03/14/14 1252  Flint MelterElliott L Wentz, MD 03/14/14 [phone]

## 2014-03-14 NOTE — Discharge Planning (Addendum)
Spoke with patient at bedside regarding discharge planning.  [facility] at 1115.  Pt concerned about affording medications after discharge from ED.  NCM verified that [facility] since he has appointment.

## 2014-03-14 NOTE — ED Notes (Signed)
Per EMS, pt has hx of asthma. Pt reports started having difficulty breathing 0030 last night took breathing treatments and got better. Pt woke up feeling sob this morning took two breathing treatment with no relief. EMS gave an additional two treatments for a total of 10mg  of albuterol, .5mg  of atrovent and 125 solumedrol in route with some relief.

## 2014-03-16 ENCOUNTER — Ambulatory Visit: Payer: Self-pay | Admitting: Family Medicine

## 2014-05-24 ENCOUNTER — Emergency Department (HOSPITAL_COMMUNITY)
Admission: EM | Admit: 2014-05-24 | Discharge: 2014-05-24 | Disposition: A | Discharge status: Home / Self Care | Payer: Self-pay | Attending: Emergency Medicine | Admitting: Emergency Medicine

## 2014-05-24 ENCOUNTER — Emergency Department (HOSPITAL_COMMUNITY): Payer: Self-pay

## 2014-05-24 ENCOUNTER — Encounter (HOSPITAL_COMMUNITY): Payer: Self-pay | Admitting: Emergency Medicine

## 2014-05-24 DIAGNOSIS — J4521 Mild intermittent asthma with (acute) exacerbation: Secondary | ICD-10-CM | POA: Insufficient documentation

## 2014-05-24 DIAGNOSIS — Z880 Allergy status to penicillin: Secondary | ICD-10-CM | POA: Insufficient documentation

## 2014-05-24 DIAGNOSIS — Z8659 Personal history of other mental and behavioral disorders: Secondary | ICD-10-CM | POA: Insufficient documentation

## 2014-05-24 DIAGNOSIS — Z7952 Long term (current) use of systemic steroids: Secondary | ICD-10-CM | POA: Insufficient documentation

## 2014-05-24 DIAGNOSIS — Z79899 Other long term (current) drug therapy: Secondary | ICD-10-CM | POA: Insufficient documentation

## 2014-05-24 DIAGNOSIS — J45901 Unspecified asthma with (acute) exacerbation: Secondary | ICD-10-CM

## 2014-05-24 DIAGNOSIS — Z7951 Long term (current) use of inhaled steroids: Secondary | ICD-10-CM | POA: Insufficient documentation

## 2014-05-24 LAB — BASIC METABOLIC PANEL
Anion gap: 7 (ref 5–15)
BUN: 15 mg/dL (ref 6–23)
CO2: 24 mmol/L (ref 19–32)
Calcium: 8.9 mg/dL (ref 8.4–10.5)
Chloride: 107 mmol/L (ref 96–112)
Creatinine, Ser: 0.9 mg/dL (ref 0.50–1.35)
GFR calc Af Amer: >90 mL/min (ref >90)
GFR calc non Af Amer: >90 mL/min (ref >90)
Glucose, Bld: 97 mg/dL (ref 70–99)
Potassium: 3.5 mmol/L (ref 3.5–5.1)
Sodium: 138 mmol/L (ref 135–145)

## 2014-05-24 LAB — CBC WITH DIFFERENTIAL/PLATELET
Basophils Absolute: 0 10*3/uL (ref 0.0–0.1)
Basophils Relative: 0 % (ref 0–1)
Eosinophils Absolute: 0.7 10*3/uL (ref 0.0–0.7)
Eosinophils Relative: 7 % — ABNORMAL HIGH (ref 0–5)
HCT: 40.7 % (ref 39.0–52.0)
Hemoglobin: 14.3 g/dL (ref 13.0–17.0)
Lymphocytes Relative: 20 % (ref 12–46)
Lymphs Abs: 2.2 10*3/uL (ref 0.7–4.0)
MCH: 31 pg (ref 26.0–34.0)
MCHC: 35.1 g/dL (ref 30.0–36.0)
MCV: 88.3 fL (ref 78.0–100.0)
Monocytes Absolute: 0.8 10*3/uL (ref 0.1–1.0)
Monocytes Relative: 7 % (ref 3–12)
Neutro Abs: 7 10*3/uL (ref 1.7–7.7)
Neutrophils Relative %: 66 % (ref 43–77)
Platelets: 330 10*3/uL (ref 150–400)
RBC: 4.61 MIL/uL (ref 4.22–5.81)
RDW: 12.5 % (ref 11.5–15.5)
WBC: 10.6 10*3/uL — ABNORMAL HIGH (ref 4.0–10.5)

## 2014-05-24 MED ORDER — GUAIFENESIN-CODEINE 100-10 MG/5ML PO SOLN
10.0000 mL | Freq: Once | ORAL | Status: AC
  Administered 2014-05-24: 10 mL via ORAL
  Filled 2014-05-24: qty 10

## 2014-05-24 MED ORDER — PREDNISONE 20 MG PO TABS
60.0000 mg | ORAL_TABLET | Freq: Once | ORAL | Status: AC
  Administered 2014-05-24: 60 mg via ORAL
  Filled 2014-05-24: qty 3

## 2014-05-24 MED ORDER — PREDNISONE 20 MG PO TABS: 40.0000 mg | ORAL_TABLET | Freq: Every day | ORAL | Status: DC

## 2014-05-24 MED ORDER — ALBUTEROL SULFATE HFA 108 (90 BASE) MCG/ACT IN AERS
2.0000 | INHALATION_SPRAY | Freq: Once | RESPIRATORY_TRACT | Status: AC
  Administered 2014-05-24: 2 via RESPIRATORY_TRACT
  Filled 2014-05-24: qty 6.7

## 2014-05-24 NOTE — ED Provider Notes (Signed)
CSN: [id number]     Arrival date & time 05/24/14  1316 History   First MD Initiated Contact with Patient 05/24/14 1339     Chief Complaint  Patient presents with  . Shortness of Breath     (Consider location/radiation/quality/duration/timing/severity/associated sxs/prior Treatment) HPI   33yM with wheezing/sob. Hx of asthma and says feels like previous exacerbations. Started very early this morning and woke up from sleep. Using nebs at home with mild improvement. Occasional nonproductive cough for the past couple weeks. No fever or chills. Denies any pain. No unusual leg pain or swelling. No n/v.   Past Medical History  Diagnosis Date  . Asthma   . ADHD (attention deficit hyperactivity disorder)    Past Surgical History  Procedure Laterality Date  . Tympanoplasty     Family History  Problem Relation Age of Onset  . Asthma Mother   . Heart failure Mother   . Hyperlipidemia Mother   . Hypertension Mother   . Migraines Mother   . Coronary artery disease Father   . Heart attack Father    History  Substance Use Topics  . Smoking status: Never Smoker   . Smokeless tobacco: Not on file  . Alcohol Use: Yes     Comment: occasionally    Review of Systems  All systems reviewed and negative, other than as noted in HPI.   Allergies  Amoxicillin-pot clavulanate and Other  Home Medications   Prior to Admission medications   Medication Sig Start Date End Date Taking? Authorizing Provider  albuterol (PROVENTIL HFA;VENTOLIN HFA) 108 (90 BASE) MCG/ACT inhaler Inhale 2 puffs into the lungs every 2 (two) hours as needed for wheezing or shortness of breath (cough). 03/14/14   Mora BellmanHannah S Merrell, PA-C  albuterol (PROVENTIL) (2.5 MG/3ML) 0.083% nebulizer solution Take 3 mLs (2.5 mg total) by nebulization every 4 (four) hours as needed for wheezing or shortness of breath. 02/21/14   Dione Boozeavid Glick, MD  albuterol (PROVENTIL) (2.5 MG/3ML) 0.083% nebulizer solution Take 3 mLs (2.5 mg total) by  nebulization every 6 (six) hours as needed for wheezing or shortness of breath. 03/07/14   Ethelda ChickMartha K Linker, MD  budesonide-formoterol (SYMBICORT) 160-4.5 MCG/ACT inhaler Inhale 2 puffs into the lungs daily. 03/14/14   Mora BellmanHannah S Merrell, PA-C  predniSONE (DELTASONE) 20 MG tablet Take 2 tablets (40 mg total) by mouth daily. 03/14/14   Mora BellmanHannah S Merrell, PA-C  predniSONE (DELTASONE) 20 MG tablet Take 2 tablets (40 mg total) by mouth daily. 05/24/14   Raeford RazorStephen Yomayra Tate, MD   BP 121/78 mmHg  Temp(Src) 97.9 F (36.6 C) (Oral)  Resp 20  Ht 5\' 9"  (1.753 m)  Wt 185 lb (83.915 kg)  BMI 27.31 kg/m2  SpO2 98% Physical Exam  Constitutional: He appears well-developed and well-nourished. No distress.  HENT:  Head: Normocephalic and atraumatic.  Eyes: Conjunctivae are normal. Right eye exhibits no discharge. Left eye exhibits no discharge.  Neck: Neck supple.  Cardiovascular: Normal rate, regular rhythm and normal heart sounds.  Exam reveals no gallop and no friction rub.   No murmur heard. Pulmonary/Chest: Effort normal. No respiratory distress.  Mild end expiratory wheezing. Speaking in complete sentences. No appreciably increased WOB.   Abdominal: Soft. He exhibits no distension. There is no tenderness.  Musculoskeletal: He exhibits no edema or tenderness.  Lower extremities symmetric as compared to each other. No calf tenderness. Negative Homan's. No palpable cords.   Neurological: He is alert.  Skin: Skin is warm and dry.  Psychiatric: He has  a normal mood and affect. His behavior is normal. Thought content normal.  Nursing note and vitals reviewed.   ED Course  Procedures (including critical care time) Labs Review Labs Reviewed  CBC WITH DIFFERENTIAL/PLATELET - Abnormal; Notable for the following:    WBC 10.6 (*)    Eosinophils Relative 7 (*)    All other components within normal limits  BASIC METABOLIC PANEL    Imaging Review Dg Chest Portable 1 View  05/24/2014   CLINICAL DATA:   Shortness of breath.  Cough.  EXAM: PORTABLE CHEST - 1 VIEW  COMPARISON:  03/14/2014  FINDINGS: The heart size and mediastinal contours are within normal limits. Both lungs are clear. The visualized skeletal structures are unremarkable.  IMPRESSION: Normal chest.   Electronically Signed   By: Geanie Cooley M.D.   On: 05/24/2014 14:16     EKG Interpretation None      MDM   Final diagnoses:  Asthma exacerbation   33yM with cough and SOB. Clinically mild asthma exacerbation. Minimal wheezing on my exam. Consider alternative etiology such as PE, pneumonia, atypical symptoms of ACS, GERD, etc but doubt.   [name], MD 05/26/14 [phone]

## 2014-05-24 NOTE — ED Notes (Signed)
Per EMS: pt from home with c/o sob and wheezing that started at 0300 today, pt denies any n/v/d or fevers. Reports non productive cough x2 weeks-hx of asthma and states he has had neb treatments at home that have not helped. EMS noted no wheezing upon arrival but reports fire dept giving 5mg  of albuterol before EMS arrived. Pt in NAD. axox 4.

## 2014-05-24 NOTE — Discharge Instructions (Signed)
Asthma, Acute Bronchospasm °Acute bronchospasm caused by asthma is also referred to as an asthma attack. Bronchospasm means your air passages become narrowed. The narrowing is caused by inflammation and tightening of the muscles in the air tubes (bronchi) in your lungs. This can make it hard to breathe or cause you to wheeze and cough. °CAUSES °Possible triggers are: °· Animal dander from the skin, hair, or feathers of animals. °· Dust mites contained in house dust. °· Cockroaches. °· Pollen from trees or grass. °· Mold. °· Cigarette or tobacco smoke. °· Air pollutants such as dust, household cleaners, hair sprays, aerosol sprays, paint fumes, strong chemicals, or strong odors. °· Cold air or weather changes. Cold air may trigger inflammation. Winds increase molds and pollens in the air. °· Strong emotions such as crying or laughing hard. °· Stress. °· Certain medicines such as aspirin or beta-blockers. °· Sulfites in foods and drinks, such as dried fruits and wine. °· Infections or inflammatory conditions, such as a flu, cold, or inflammation of the nasal membranes (rhinitis). °· Gastroesophageal reflux disease (GERD). GERD is a condition where stomach acid backs up into your esophagus. °· Exercise or strenuous activity. °SIGNS AND SYMPTOMS  °· Wheezing. °· Excessive coughing, particularly at night. °· Chest tightness. °· Shortness of breath. °DIAGNOSIS  °Your health care provider will ask you about your medical history and perform a physical exam. A chest X-ray or blood testing may be performed to look for other causes of your symptoms or other conditions that may have triggered your asthma attack.  °TREATMENT  °Treatment is aimed at reducing inflammation and opening up the airways in your lungs.  Most asthma attacks are treated with inhaled medicines. These include quick relief or rescue medicines (such as bronchodilators) and controller medicines (such as inhaled corticosteroids). These medicines are sometimes  given through an inhaler or a nebulizer. Systemic steroid medicine taken by mouth or given through an IV tube also can be used to reduce the inflammation when an attack is moderate or severe. Antibiotic medicines are only used if a bacterial infection is present.  °HOME CARE INSTRUCTIONS  °· Rest. °· Drink plenty of liquids. This helps the mucus to remain thin and be easily coughed up. Only use caffeine in moderation and do not use alcohol until you have recovered from your illness. °· Do not smoke. Avoid being exposed to secondhand smoke. °· You play a critical role in keeping yourself in good health. Avoid exposure to things that cause you to wheeze or to have breathing problems. °· Keep your medicines up-to-date and available. Carefully follow your health care provider's treatment plan. °· Take your medicine exactly as prescribed. °· When pollen or pollution is bad, keep windows closed and use an air conditioner or go to places with air conditioning. °· Asthma requires careful medical care. See your health care provider for a follow-up as advised. If you are more than [redacted] weeks pregnant and you were prescribed any new medicines, let your obstetrician know about the visit and how you are doing. Follow up with your health care provider as directed. °· After you have recovered from your asthma attack, make an appointment with your outpatient doctor to talk about ways to reduce the likelihood of future attacks. If you do not have a doctor who manages your asthma, make an appointment with a primary care doctor to discuss your asthma. °SEEK IMMEDIATE MEDICAL CARE IF:  °· You are getting worse. °· You have trouble breathing. If severe, call your local   emergency services (911 in the [place] °· You develop chest pain or discomfort. °· You are vomiting. °· You are not able to keep fluids down. °· You are coughing up yellow, green, brown, or bloody sputum. °· You have a fever and your symptoms suddenly get worse. °· You have  trouble swallowing. °MAKE SURE YOU:  °· Understand these instructions. °· Will watch your condition. °· Will get help right away if you are not doing well or get worse. °Document Released: 07/24/2006 Document Revised: 04/13/2013 Document Reviewed: 10/14/2012 °ExitCare® Patient Information ©2015 [employer]. This information is not intended to replace advice given to you by your health care provider. Make sure you discuss any questions you have with your health care provider. ° ° °Emergency Department Resource Guide °1) Find a Doctor and Pay Out of Pocket °Although you won't have to find out who is covered by your insurance plan, it is a good idea to ask around and get recommendations. You will then need to call the office and see if the doctor you have chosen will accept you as a new patient and what types of options they offer for patients who are self-pay. Some doctors offer discounts or will set up payment plans for their patients who do not have insurance, but you will need to ask so you aren't surprised when you get to your appointment. ° °2) Contact Your Local Health Department °Not all health departments have doctors that can see patients for sick visits, but many do, so it is worth a call to see if yours does. If you don't know where your local health department is, you can check in your phone book. The CDC also has a tool to help you locate your state's health department, and many state websites also have listings of all of their local health departments. ° °3) Find a Walk-in Clinic °If your illness is not likely to be very severe or complicated, you may want to try a walk in clinic. These are popping up all over the country in pharmacies, drugstores, and shopping centers. They're usually staffed by nurse practitioners or physician assistants that have been trained to treat common illnesses and complaints. They're usually fairly quick and inexpensive. However, if you have serious medical issues or chronic  medical problems, these are probably not your best option. ° °No Primary Care Doctor: °- Call [facility] at  832-8000 - they can help you locate a primary care doctor that  accepts your insurance, provides certain services, etc. °- Physician Referral Service- 1-800-533-3463 ° °Chronic Pain Problems: °Organization         Address  Phone   Notes  °[facility]  (336) 297-2271 Patients need to be referred by their primary care doctor.  ° °Medication Assistance: °Organization         Address  Phone   Notes  °[place] [facility] [address] °[place], [place] [address] (336) 641-8030 --Must be a resident of [place] °-- Must have NO insurance coverage whatsoever (no Medicaid/ Medicare, etc.) °-- The pt. MUST have a primary care doctor that directs their care regularly and follows them in the community °  °[facility]  (866) 331-1348   °[employer]  (888) 892-1162   ° °Agencies that provide inexpensive medical care: °Organization         Address  Phone   Notes  °[facility]  (336) 832-8035   ° Internal Medicine    (  336) 832-7272   °[facility] [address] °[place], [place] [address] (336) 832-4777   °[facility] [address], °[place] (336) 271-4999   °[facility]    (336) 373-0678   °[facility]    (336) 272-1050   °[facility] ° [address], [place] Phone:  (336) 832-4444, Fax:  (336) 832-4440 Hours of Operation:  9 am - 6 pm, M-F.  Also accepts Medicaid/Medicare and self-pay.  °[facility] ° [address], [place] Phone: (336) [phone], Fax: (336) [phone]. Hours of Operation:  8:30 am - 5:30 pm, M-F.  Also accepts Medicaid and self-pay.  °[facility] [place] [address], [place] Phone: (336) 878-6027   °[facility] [address], [place], [place] (336)723-1848,  Ext. 123 Mondays & Thursdays: 7-9 AM.  First 15 patients are seen on a first come, first serve basis. °  ° °Medicaid-accepting [place] Providers: ° °Organization         Address  Phone   Notes  °[facility] [address], [place] (336) 641-2100 Also accepts self-pay patients.  °[facility] [address], [place] ° (336) 856-9996   °[facility] [address], [place] (336) 288-8857   °[facility] [address] [place] [address], [place] (336) 299-7000   °Veita Bland [address], [place]  ° (336) 373-1557 Only accepts [place] Access Medicaid patients after they have their name applied to their card.  ° °Self-Pay (no insurance) in [place]: ° °Organization         Address  Phone   Notes  °Sickle Cell Patients, [facility] [address], [place] (336) 832-1970   °[facility] [address], [place] (336) 832-4400   °[facility] ° [address], [place] (336) 992-4800   °[facility]/Dr. Osei-Bonsu ° [address] [place] [address], [place] or [address], [place] (336) 841-8500 Phone number for both [place] and [place] locations is the same.  °[facility] [address], [place] (336) 299-0000   °[facility] [address] [place] [address], [place] or [address] (336) 852-7530 °(336) 878-2260   °[facility] [address], [place] (336) 350-1642, phone; (336) 294-5005, fax Sees patients 1st and 3rd Saturday of every month.  Must not qualify for public or private insurance (i.e. Medicaid, Medicare, [place] Health Choice, Veterans' Benefits) • Household income should be no more than 200% of the poverty level •The clinic cannot treat you if you are pregnant or think you are pregnant • Sexually transmitted diseases are not  treated at the clinic.  ° ° °Dental Care: °Organization         Address  Phone  Notes  °[facility] [address], [place] (336) 641-6152 Accepts children up to age 21 who are enrolled in Medicaid or [place] Health Choice; pregnant women with a Medicaid card; and children who have applied for Medicaid or [place] Health Choice, but were declined, whose parents can pay a reduced fee at time of service.  °[facility] [place]  [address], [place] (336) 641-7733 Accepts children up   to age 21 who are enrolled in Medicaid or [place] Health Choice; pregnant women with a Medicaid card; and children who have applied for Medicaid or [place] Health Choice, but were declined, whose parents can pay a reduced fee at time of service.  °[facility] ° [address], [place] (336) 641-4533 Patients are seen by appointment only. Walk-ins are not accepted. [facility] will see patients 18 years of age and older. °Monday - Tuesday (8am-5pm) °Most Wednesdays (8:30-5pm) °$30 per visit, cash only  °[facility] ° [address], [place] (336) 641-4533 Patients are seen by appointment only. Walk-ins are not accepted. [facility] will see patients 18 years of age and older. °One Wednesday Evening (Monthly: Volunteer Based).  $30 per visit, cash only  °[facility]  (919) 537-3737 for adults; Children under age 4, call Graduate Pediatric Dentistry at ([phone]) [phone]. Children aged 4-14, please call ([phone]) [phone] to request a pediatric application. ° Dental services are provided in all areas of dental care including fillings, crowns and bridges, complete and partial dentures, implants, gum treatment, root canals, and extractions. Preventive care is also provided. Treatment is provided to both adults and children. °Patients are selected via a lottery and there is  often a waiting list. °  °[facility] [address], °[place] ° (336) 763-8833 [email or web address] °  °[facility] [address], [place], [place] (336)723-1848, Ext. 123 Second and Fourth Thursday of each month, opens at 6:30 AM; Clinic ends at 9 AM.  Patients are seen on a first-come first-served basis, and a limited number are seen during each clinic.  ° °[facility] ° [address], [place], [place] (336) 723-7904   Eligibility Requirements °You must have lived in [place], [place], or [place] counties for at least the last three months. °  You cannot be eligible for state or federal sponsored healthcare insurance, including Veterans Administration, Medicaid, or Medicare. °  You generally cannot be eligible for healthcare insurance through your employer.  °  How to apply: °Eligibility screenings are held every Tuesday and Wednesday afternoon from 1:00 pm until 4:00 pm. You do not need an appointment for the interview!  °[facility] [address], [place], [place] 336-631-2330   °[facility]  336-342-8273   °[facility]  336-703-3100   °[facility]  336-570-6415   ° °Behavioral Health Resources in the Community: °Intensive Outpatient Programs °Organization         Address  Phone  Notes  °[facility] [address], [place], [place] 336-878-6098   °[facility] [address], [place], [place] 336-832-9800   °[facility] [address], [place], [place] ° 336-882-2125   °[facility] [address],  °[place],  1-800-853-5163 or 336-641-4981   °Substance Abuse Resources °Organization         Address  Phone  Notes  °[facility]  336-882-2125   °[facility]  336-784-9470   °[facility]  336-285-9073   °[facility]  336-845-3988   °Residential & Outpatient Substance  Abuse Program  1-800-659-3381   °Psychological Services °Organization         Address  Phone  Notes  °[facility]  336- 832-9600   °[facility]  336- 378-7881   °[facility]   [address], [place] 1-800-853-5163 or 336-641-4981   ° °Mobile Crisis Teams °Organization         Address  Phone  Notes  °[facility], Mobile Crisis Care Unit  1-877-626-1772   °Assertive °[facility] ° [address] [place], [place] 336-834-9664   °Sharon DeEsch [address] °[place] 336-554-5454   ° °Self-Help/Support Groups °Organization         Address  Phone             Notes  °[facility] [place] - variety of support groups  336- 373-1402 Call for more information  °Narcotics Anonymous (NA), [facility] [address], °[place]  2 meetings at this location  ° °Residential Treatment Programs °Organization         Address  Phone  Notes  °[facility] [address],    °[place]  1-866-801-8205   °[facility] ° [address], [place], [place] 704-293-8524   °[facility] [address], [place] 336-845-3988 Admissions: 8am-3pm M-F  °[facility] [address]    °[place], [place] 336-841-1104   °[facility] [address], [place], [place] 336-379-7146   °[facility] [address]  °[place], [place] 336-285-9073   °[facility] - Intensive Outpatient [address], [place], [place] 336-852-3033   °[facility] [address]  °[place], [place] 1-877-615-2722 or 336-784-9470   °[facility] [address] [place], [place] 336-227-7417 Accepts Medicaid  °[facility] [address]  °[place] 1-800-659-3381 Substance Abuse/Addiction Treatment  ° °[place] Behavioral Health Resources °Organization          Address  Phone  Notes  °[facility]  (888) 581-9988   °Julie Brannon, PhD [address] [place], [place]   (336) 349-5553 or (336) 951-0000   °[facility]   [address] °[place], [place] (336) 349-4454   °[facility] [address], [place], [place] (336) 342-8316 Insurance/Medicaid/sponsorship through [facility]  °[facility] [address]                                    [place], [place] (336) 342-8316 Therapy/tele-psych/case  °[facility] [address]  ° [place], [place] (336) 349-2233    °Dr. Arfeen  (336) 349-4544   °[facility] [place]  [employer] [place] [facility] 1) [address], [place] °2) [address], [place] °3)  [address], [place] (336) 349-3220 °(336) 342-7768 ° °(336) 342-8140   °[place] [facility] (336) 342-1394 or (336) 342-3537 (After Hours)    ° ° ° °

## 2014-06-08 ENCOUNTER — Emergency Department (HOSPITAL_COMMUNITY): Payer: Self-pay

## 2014-06-08 ENCOUNTER — Encounter (HOSPITAL_COMMUNITY): Payer: Self-pay | Admitting: Emergency Medicine

## 2014-06-08 ENCOUNTER — Inpatient Hospital Stay (HOSPITAL_COMMUNITY)
Admission: EM | Admit: 2014-06-08 | Discharge: 2014-06-09 | DRG: 202 | Disposition: A | Discharge status: Home / Self Care | Payer: Self-pay | Attending: Internal Medicine | Admitting: Internal Medicine

## 2014-06-08 DIAGNOSIS — J45901 Unspecified asthma with (acute) exacerbation: Principal | ICD-10-CM | POA: Diagnosis present

## 2014-06-08 DIAGNOSIS — T380X5A Adverse effect of glucocorticoids and synthetic analogues, initial encounter: Secondary | ICD-10-CM | POA: Diagnosis not present

## 2014-06-08 DIAGNOSIS — F909 Attention-deficit hyperactivity disorder, unspecified type: Secondary | ICD-10-CM | POA: Diagnosis present

## 2014-06-08 DIAGNOSIS — J45902 Unspecified asthma with status asthmaticus: Secondary | ICD-10-CM

## 2014-06-08 DIAGNOSIS — J9600 Acute respiratory failure, unspecified whether with hypoxia or hypercapnia: Secondary | ICD-10-CM | POA: Diagnosis present

## 2014-06-08 DIAGNOSIS — J4552 Severe persistent asthma with status asthmaticus: Secondary | ICD-10-CM

## 2014-06-08 DIAGNOSIS — Z888 Allergy status to other drugs, medicaments and biological substances status: Secondary | ICD-10-CM

## 2014-06-08 DIAGNOSIS — Z881 Allergy status to other antibiotic agents status: Secondary | ICD-10-CM

## 2014-06-08 DIAGNOSIS — D72829 Elevated white blood cell count, unspecified: Secondary | ICD-10-CM | POA: Diagnosis not present

## 2014-06-08 LAB — CBC WITH DIFFERENTIAL/PLATELET
BASOS ABS: 0 10*3/uL (ref 0.0–0.1)
Basophils Relative: 0 % (ref 0–1)
Eosinophils Absolute: 0.8 10*3/uL — ABNORMAL HIGH (ref 0.0–0.7)
Eosinophils Relative: 8 % — ABNORMAL HIGH (ref 0–5)
HEMATOCRIT: 44.4 % (ref 39.0–52.0)
Hemoglobin: 15.4 g/dL (ref 13.0–17.0)
LYMPHS ABS: 4.2 10*3/uL — AB (ref 0.7–4.0)
Lymphocytes Relative: 44 % (ref 12–46)
MCH: 31.1 pg (ref 26.0–34.0)
MCHC: 34.7 g/dL (ref 30.0–36.0)
MCV: 89.7 fL (ref 78.0–100.0)
Monocytes Absolute: 1 10*3/uL (ref 0.1–1.0)
Monocytes Relative: 10 % (ref 3–12)
NEUTROS ABS: 3.6 10*3/uL (ref 1.7–7.7)
Neutrophils Relative %: 38 % — ABNORMAL LOW (ref 43–77)
PLATELETS: 310 10*3/uL (ref 150–400)
RBC: 4.95 MIL/uL (ref 4.22–5.81)
RDW: 12.9 % (ref 11.5–15.5)
WBC: 9.6 10*3/uL (ref 4.0–10.5)

## 2014-06-08 LAB — INFLUENZA PANEL BY PCR (TYPE A & B)
H1N1 flu by pcr: NOT DETECTED
INFLAPCR: NEGATIVE
Influenza B By PCR: NEGATIVE

## 2014-06-08 LAB — BASIC METABOLIC PANEL
Anion gap: 10 (ref 5–15)
BUN: 14 mg/dL (ref 6–23)
CHLORIDE: 106 mmol/L (ref 96–112)
CO2: 22 mmol/L (ref 19–32)
CREATININE: 0.92 mg/dL (ref 0.50–1.35)
Calcium: 9.3 mg/dL (ref 8.4–10.5)
GFR calc Af Amer: >90 mL/min (ref >90)
GFR calc non Af Amer: >90 mL/min (ref >90)
GLUCOSE: 97 mg/dL (ref 70–99)
Potassium: 3.8 mmol/L (ref 3.5–5.1)
Sodium: 138 mmol/L (ref 135–145)

## 2014-06-08 MED ORDER — ALBUTEROL SULFATE (2.5 MG/3ML) 0.083% IN NEBU
2.5000 mg | INHALATION_SOLUTION | Freq: Four times a day (QID) | RESPIRATORY_TRACT | Status: DC
  Administered 2014-06-08 – 2014-06-09 (×5): 2.5 mg via RESPIRATORY_TRACT
  Filled 2014-06-08 (×4): qty 3

## 2014-06-08 MED ORDER — METHYLPREDNISOLONE SODIUM SUCC 125 MG IJ SOLR
60.0000 mg | Freq: Four times a day (QID) | INTRAMUSCULAR | Status: DC
  Administered 2014-06-08 – 2014-06-09 (×4): 60 mg via INTRAVENOUS
  Filled 2014-06-08 (×8): qty 0.96

## 2014-06-08 MED ORDER — INFLUENZA VAC SPLIT QUAD 0.5 ML IM SUSY
0.5000 mL | PREFILLED_SYRINGE | INTRAMUSCULAR | Status: DC
  Filled 2014-06-08: qty 0.5

## 2014-06-08 MED ORDER — IPRATROPIUM-ALBUTEROL 0.5-2.5 (3) MG/3ML IN SOLN
3.0000 mL | Freq: Once | RESPIRATORY_TRACT | Status: AC
  Administered 2014-06-08: 3 mL via RESPIRATORY_TRACT

## 2014-06-08 MED ORDER — BUDESONIDE-FORMOTEROL FUMARATE 160-4.5 MCG/ACT IN AERO
2.0000 | INHALATION_SPRAY | Freq: Every day | RESPIRATORY_TRACT | Status: DC
  Administered 2014-06-08 – 2014-06-09 (×2): 2 via RESPIRATORY_TRACT
  Filled 2014-06-08: qty 6

## 2014-06-08 MED ORDER — SODIUM CHLORIDE 0.9 % IV SOLN: 250.0000 mL | INTRAVENOUS | Status: DC | PRN

## 2014-06-08 MED ORDER — ACETAMINOPHEN 650 MG RE SUPP: 650.0000 mg | Freq: Four times a day (QID) | RECTAL | Status: DC | PRN

## 2014-06-08 MED ORDER — ONDANSETRON HCL 4 MG PO TABS: 4.0000 mg | ORAL_TABLET | Freq: Four times a day (QID) | ORAL | Status: DC | PRN

## 2014-06-08 MED ORDER — ALUM & MAG HYDROXIDE-SIMETH 200-200-20 MG/5ML PO SUSP: 30.0000 mL | Freq: Four times a day (QID) | ORAL | Status: DC | PRN

## 2014-06-08 MED ORDER — ALBUTEROL SULFATE (2.5 MG/3ML) 0.083% IN NEBU
5.0000 mg | INHALATION_SOLUTION | Freq: Once | RESPIRATORY_TRACT | Status: AC
  Administered 2014-06-08: 5 mg via RESPIRATORY_TRACT
  Filled 2014-06-08: qty 6

## 2014-06-08 MED ORDER — ALBUTEROL (5 MG/ML) CONTINUOUS INHALATION SOLN
10.0000 mg/h | INHALATION_SOLUTION | Freq: Once | RESPIRATORY_TRACT | Status: AC
  Administered 2014-06-08: 10 mg/h via RESPIRATORY_TRACT
  Filled 2014-06-08: qty 20

## 2014-06-08 MED ORDER — ALBUTEROL SULFATE (2.5 MG/3ML) 0.083% IN NEBU
INHALATION_SOLUTION | RESPIRATORY_TRACT | Status: AC
  Filled 2014-06-08: qty 3

## 2014-06-08 MED ORDER — OXYCODONE HCL 5 MG PO TABS
5.0000 mg | ORAL_TABLET | ORAL | Status: DC | PRN
  Administered 2014-06-09: 5 mg via ORAL
  Filled 2014-06-08: qty 1

## 2014-06-08 MED ORDER — SODIUM CHLORIDE 0.9 % IJ SOLN: 3.0000 mL | Freq: Two times a day (BID) | INTRAMUSCULAR | Status: DC

## 2014-06-08 MED ORDER — MAGNESIUM SULFATE IN D5W 10-5 MG/ML-% IV SOLN
1.0000 g | Freq: Once | INTRAVENOUS | Status: AC
  Administered 2014-06-08: 1 g via INTRAVENOUS
  Filled 2014-06-08: qty 100

## 2014-06-08 MED ORDER — ACETAMINOPHEN 325 MG PO TABS: 650.0000 mg | ORAL_TABLET | Freq: Four times a day (QID) | ORAL | Status: DC | PRN

## 2014-06-08 MED ORDER — SODIUM CHLORIDE 0.9 % IJ SOLN: 3.0000 mL | INTRAMUSCULAR | Status: DC | PRN

## 2014-06-08 MED ORDER — PANTOPRAZOLE SODIUM 40 MG PO TBEC
40.0000 mg | DELAYED_RELEASE_TABLET | Freq: Every day | ORAL | Status: DC
  Administered 2014-06-08 – 2014-06-09 (×2): 40 mg via ORAL
  Filled 2014-06-08 (×2): qty 1

## 2014-06-08 MED ORDER — MORPHINE SULFATE 2 MG/ML IJ SOLN: 1.0000 mg | INTRAMUSCULAR | Status: DC | PRN

## 2014-06-08 MED ORDER — ALBUTEROL SULFATE (2.5 MG/3ML) 0.083% IN NEBU: 2.5000 mg | INHALATION_SOLUTION | RESPIRATORY_TRACT | Status: DC | PRN

## 2014-06-08 MED ORDER — ONDANSETRON HCL 4 MG/2ML IJ SOLN
4.0000 mg | Freq: Four times a day (QID) | INTRAMUSCULAR | Status: DC | PRN
Start: 2014-06-08 — End: 2014-06-09

## 2014-06-08 MED ORDER — ENOXAPARIN SODIUM 40 MG/0.4ML ~~LOC~~ SOLN
40.0000 mg | SUBCUTANEOUS | Status: DC
  Administered 2014-06-08: 40 mg via SUBCUTANEOUS
  Filled 2014-06-08 (×2): qty 0.4

## 2014-06-08 MED ORDER — SODIUM CHLORIDE 0.9 % IJ SOLN
3.0000 mL | Freq: Two times a day (BID) | INTRAMUSCULAR | Status: DC
  Administered 2014-06-08 (×2): 3 mL via INTRAVENOUS

## 2014-06-08 NOTE — H&P (Signed)
[facility] History and Physical  Devin [name] FAO:[id number] DOB: 1980/10/05 DOA: 06/08/2014  Referring physician:  PCP: No PCP Per Patient   Chief Complaint: Cough/shortness of breath  HPI: Devin [name] is a 34 y.o. male with a past medical history of asthma who was brought to the emergency department overnight for complaints of worsening shortness of breath and cough. Patient was recently seen in the emergency department on 05/24/2014 with complaints of cough and shortness of breath treated for asthmatic exacerbation and discharged from the emergency department in stable condition. He reports ongoing cough over the past 2 weeks with associated wheezing. He states at approximately 2 AM this morning he woke up with significant shortness of breath, cough, wheezing, "gasping for air"  for which he called EMS. He was administered 125 mg of Solu-Medrol and in the emergency department was given albuterol nebs along with magnesium. Although he states feeling better during my evaluation continues to feel tightness and not quite at his baseline. Patient reports compliance to his medications at home that include albuterol nebulizers and Pulmicort. He denies tobacco abuse or exposure to secondhand smoke. Patient reported not receiving the flu vaccine this year.                                                                                                                                                                                                                                                                       Review of Systems:  Constitutional:  No weight loss, night sweats, Fevers, positive for chills, fatigue.  HEENT:  No headaches, Difficulty swallowing,Tooth/dental problems,Sore throat,  No sneezing, itching, ear ache, nasal congestion, post nasal drip,  Cardio-vascular:  No chest pain, Orthopnea, PND, swelling in lower extremities, anasarca, dizziness, palpitations  GI:  No heartburn,  indigestion, abdominal pain, nausea, vomiting, diarrhea, change in bowel habits, loss of appetite  Resp:  Positive for shortness of breath with exertion or at rest. No excess mucus, no productive cough, No non-productive cough, No coughing up of blood.No change in color of mucus.No wheezing.No chest wall deformity  Skin:  no rash or lesions.  GU:  no dysuria, change in color of urine, no urgency or frequency. No flank pain.  Musculoskeletal:  No joint pain or  swelling. No decreased range of motion. No back pain.  Psych:  No change in mood or affect. No depression or anxiety. No memory loss.   Past Medical History  Diagnosis Date  . Asthma   . ADHD (attention deficit hyperactivity disorder)    Past Surgical History  Procedure Laterality Date  . Tympanoplasty     Social History:  reports that he has never smoked. He does not have any smokeless tobacco history on file. He reports that he drinks alcohol. He reports that he does not use illicit drugs.  Allergies  Allergen Reactions  . Amoxicillin-Pot Clavulanate Hives    Tolerates Amoxicillin - Thuy 09/29/12  . Other Nausea And Vomiting    coconut    Family History  Problem Relation Age of Onset  . Asthma Mother   . Heart failure Mother   . Hyperlipidemia Mother   . Hypertension Mother   . Migraines Mother   . Coronary artery disease Father   . Heart attack Father     Prior to Admission medications   Medication Sig Start Date End Date Taking? Authorizing Provider  albuterol (PROVENTIL HFA;VENTOLIN HFA) 108 (90 BASE) MCG/ACT inhaler Inhale 2 puffs into the lungs every 2 (two) hours as needed for wheezing or shortness of breath (cough). 03/14/14  Yes Mora Bellman, PA-C  albuterol (PROVENTIL) (2.5 MG/3ML) 0.083% nebulizer solution Take 3 mLs (2.5 mg total) by nebulization every 4 (four) hours as needed for wheezing or shortness of breath. 02/21/14  Yes Dione Booze, MD  budesonide-formoterol [facility]) 160-4.5 MCG/ACT inhaler  Inhale 2 puffs into the lungs daily. 03/14/14  Yes Mora Bellman, PA-C  albuterol (PROVENTIL) (2.5 MG/3ML) 0.083% nebulizer solution Take 3 mLs (2.5 mg total) by nebulization every 6 (six) hours as needed for wheezing or shortness of breath. Patient not taking: Reported on 06/08/2014 03/07/14   Ethelda Chick, MD  predniSONE (DELTASONE) 20 MG tablet Take 2 tablets (40 mg total) by mouth daily. Patient not taking: Reported on 06/08/2014 03/14/14   Mora Bellman, PA-C  predniSONE (DELTASONE) 20 MG tablet Take 2 tablets (40 mg total) by mouth daily. Patient not taking: Reported on 06/08/2014 05/24/14   [name], MD   Physical Exam: Filed Vitals:   06/08/14 0630 06/08/14 0645 06/08/14 0700 06/08/14 0715  BP: 112/60 101/54 112/75 105/69  Pulse: 106 113 107 108  Temp:      TempSrc:      Resp: Height:      Weight:      SpO2: 92% 93% 94% 99%    Wt Readings from Last 3 Encounters:  06/08/14 83.915 kg (185 lb)  05/24/14 83.915 kg (185 lb)  03/07/14 84.823 kg (187 lb)    General:  Patient having mild dyspnea at rest, audible wheezes, awake alert conversive Eyes: PERRL, normal lids, irises & conjunctiva ENT: grossly normal hearing, lips & tongue Neck: no LAD, masses or thyromegaly Cardiovascular: Tachycardic, RRR, no m/r/g. No LE edema. Telemetry: SR, no arrhythmias  Respiratory: Diminished breath tones bilaterally with expiratory wheezes, scattered rhonchi. I did not note utilization of accessory muscles Abdomen: soft, ntnd Skin: no rash or induration seen on limited exam Musculoskeletal: grossly normal tone BUE/BLE Psychiatric: grossly normal mood and affect, speech fluent and appropriate Neurologic: grossly non-focal.          Labs on Admission:  Basic Metabolic Panel:  Recent Labs Lab 06/08/14 0402  NA 138  K 3.8  CL 106  CO2 22  GLUCOSE 97  BUN 14  CREATININE 0.92  CALCIUM 9.3   Liver Function Tests: No results for input(s): AST, ALT, ALKPHOS,  BILITOT, PROT, ALBUMIN in the last 168 hours. No results for input(s): LIPASE, AMYLASE in the last 168 hours. No results for input(s): AMMONIA in the last 168 hours. CBC:  Recent Labs Lab 06/08/14 0402  WBC 9.6  NEUTROABS 3.6  HGB 15.4  HCT 44.4  MCV 89.7  PLT 310   Cardiac Enzymes: No results for input(s): CKTOTAL, CKMB, CKMBINDEX, TROPONINI in the last 168 hours.  BNP (last 3 results) No results for input(s): BNP in the last 8760 hours.  ProBNP (last 3 results) No results for input(s): PROBNP in the last 8760 hours.  CBG: No results for input(s): GLUCAP in the last 168 hours.  Radiological Exams on Admission: Dg Chest 2 View  06/08/2014   CLINICAL DATA:  Dyspnea  EXAM: CHEST  2 VIEW  COMPARISON:  05/24/2014  FINDINGS: The heart size and mediastinal contours are within normal limits. Both lungs are clear. The visualized skeletal structures are unremarkable.  IMPRESSION: No active cardiopulmonary disease.   Electronically Signed   By: Ellery Plunkaniel R Mitchell M.D.   On: 06/08/2014 04:20    EKG: Independently reviewed.   Assessment/Plan Active Problems:   Status asthmaticus   Asthmatic bronchitis with exacerbation   1. Acute asthmatic exacerbation. Patient with a history of asthma presenting with increased shortness of breath and cough, having bilateral external tori wheezes on exam. Improved in the emergency department after the administration of systemic steroids and beta agonists. We'll admit him to the inpatient service whereupon a continual systemic steroids with centimeter 06 mg IV every 6 hours along with scheduled albuterol nebulizers. Will continue inhaled steroid. Initial chest x-ray did not reveal acute infiltrate. Reported not having flu vaccine this see her, will check a flu swab. 2. Acute respiratory failure, evidence by patient presenting in respiratory distress, having respiratory rate of 35, likely due to asthmatic exacerbation. As outlined above will treat with IV  steroids, nebulizers, inhaled steroids, supportive care    Code Status: Full code  DVT Prophylaxis:Lovenox  Family Communication: Family not available  Disposition Plan: Will admit patient to the inpatient service, may require greater than 2 nights hospitalization  Time spent: 60 minutes  Jeralyn BennettZAMORA, Ardena Gangl [facility] Pager [phone]

## 2014-06-08 NOTE — ED Notes (Signed)
MD at bedside. 

## 2014-06-08 NOTE — ED Provider Notes (Signed)
CSN: [id number]     Arrival date & time 06/08/14  0344 History  This chart was scribed for Linwood Dibbles, MD by Bronson Curb, ED Scribe. This patient was seen in room A11C/A11C and the patient's care was started at 3:51 AM.   Chief Complaint  Patient presents with  . Shortness of Breath    The history is provided by the patient. No language interpreter was used.     HPI Comments: Devin [name] is a 34 y.o. male, with history of asthma, brought in by ambulance, who presents to the Emergency Department complaining of SOB that began approximately 30 minuntes ago. Patient states he was sleeping prior to onset and notes the SOB woke him from sleep. He reports he has been coughing the past week. There is associated wheezing. He states he was admitted to the hospital several years ago for asthma. Patient denies CP, leg swelling, nausea, or vomiting. Patient was seen here 2 weeks ago for the same.   Past Medical History  Diagnosis Date  . Asthma   . ADHD (attention deficit hyperactivity disorder)    Past Surgical History  Procedure Laterality Date  . Tympanoplasty     Family History  Problem Relation Age of Onset  . Asthma Mother   . Heart failure Mother   . Hyperlipidemia Mother   . Hypertension Mother   . Migraines Mother   . Coronary artery disease Father   . Heart attack Father    History  Substance Use Topics  . Smoking status: Never Smoker   . Smokeless tobacco: Not on file  . Alcohol Use: Yes     Comment: occasionally    Review of Systems  A complete 10 system review of systems was obtained and all systems are negative except as noted in the HPI and PMH.    Allergies  Amoxicillin-pot clavulanate and Other  Home Medications   Prior to Admission medications   Medication Sig Start Date End Date Taking? Authorizing Provider  albuterol (PROVENTIL HFA;VENTOLIN HFA) 108 (90 BASE) MCG/ACT inhaler Inhale 2 puffs into the lungs every 2 (two) hours as needed for wheezing or  shortness of breath (cough). 03/14/14  Yes Mora Bellman, PA-C  albuterol (PROVENTIL) (2.5 MG/3ML) 0.083% nebulizer solution Take 3 mLs (2.5 mg total) by nebulization every 4 (four) hours as needed for wheezing or shortness of breath. 02/21/14  Yes Dione Booze, MD  budesonide-formoterol [facility]) 160-4.5 MCG/ACT inhaler Inhale 2 puffs into the lungs daily. 03/14/14  Yes Mora Bellman, PA-C  albuterol (PROVENTIL) (2.5 MG/3ML) 0.083% nebulizer solution Take 3 mLs (2.5 mg total) by nebulization every 6 (six) hours as needed for wheezing or shortness of breath. Patient not taking: Reported on 06/08/2014 03/07/14   Ethelda Chick, MD  predniSONE (DELTASONE) 20 MG tablet Take 2 tablets (40 mg total) by mouth daily. Patient not taking: Reported on 06/08/2014 03/14/14   Mora Bellman, PA-C  predniSONE (DELTASONE) 20 MG tablet Take 2 tablets (40 mg total) by mouth daily. Patient not taking: Reported on 06/08/2014 05/24/14   [name], MD   Triage Vitals: BP 131/82 mmHg  Pulse 115  Temp(Src) 97.8 F (36.6 C) (Axillary)  Resp 14  Ht  (1.753 m)  Wt 185 lb (83.915 kg)  BMI 27.31 kg/m2  SpO2 98%  Physical Exam  Constitutional: He appears well-developed and well-nourished. No distress.  HENT:  Head: Normocephalic and atraumatic.  Right Ear: External ear normal.  Left Ear: External ear normal.  Eyes:  Conjunctivae are normal. Right eye exhibits no discharge. Left eye exhibits no discharge. No scleral icterus.  Neck: Neck supple. No tracheal deviation present.  Cardiovascular: Regular rhythm and intact distal pulses.  Tachycardia present.   Pulmonary/Chest: Accessory muscle usage present. No stridor. Tachypnea noted. No respiratory distress. He has wheezes. He has no rales.  Abdominal: Soft. Bowel sounds are normal. He exhibits no distension. There is no tenderness. There is no rebound and no guarding.  Musculoskeletal: He exhibits no edema or tenderness.  Neurological: He is alert. He  has normal strength. No cranial nerve deficit (no facial droop, extraocular movements intact, no slurred speech) or sensory deficit. He exhibits normal muscle tone. He displays no seizure activity. Coordination normal.  Skin: Skin is warm and dry. No rash noted.  Psychiatric: He has a normal mood and affect.  Nursing note and vitals reviewed.   ED Course  Procedures (including critical care time)  DIAGNOSTIC STUDIES: Oxygen Saturation is 98% on room air, normal by my interpretation.    COORDINATION OF CARE: At 0354 Discussed treatment plan with patient. Patient agrees.   Labs Review Labs Reviewed  CBC WITH DIFFERENTIAL/PLATELET - Abnormal; Notable for the following:    Neutrophils Relative % 38 (*)    Eosinophils Relative 8 (*)    Lymphs Abs 4.2 (*)    Eosinophils Absolute 0.8 (*)    All other components within normal limits  BASIC METABOLIC PANEL    Imaging Review Dg Chest 2 View  06/08/2014   CLINICAL DATA:  Dyspnea  EXAM: CHEST  2 VIEW  COMPARISON:  05/24/2014  FINDINGS: The heart size and mediastinal contours are within normal limits. Both lungs are clear. The visualized skeletal structures are unremarkable.  IMPRESSION: No active cardiopulmonary disease.   Electronically Signed   By: Ellery Plunkaniel R Mitchell M.D.   On: 06/08/2014 04:20     EKG Interpretation   Date/Time:  Wednesday June 08 2014 04:02:58 EST Ventricular Rate:  111 PR Interval:  160 QRS Duration: 88 QT Interval:  329 QTC Calculation: 447 R Axis:   54 Text Interpretation:  Sinus tachycardia Premature ventricular complexes ,  new since last tracing Confirmed by Terrian Sentell  MD-J, Jarron Curley ([id number]) on 06/08/2014  4:14:02 AM     Medications  ipratropium-albuterol (DUONEB) 0.5-2.5 (3) MG/3ML nebulizer solution 3 mL (3 mLs Nebulization Given 06/08/14 0354)  albuterol (PROVENTIL,VENTOLIN) solution continuous neb (10 mg/hr Nebulization Given 06/08/14 0431)  given solumedrol by ems  MDM   Final diagnoses:  Asthma with  status asthmaticus, unspecified asthma severity    Pt with persistent wheezing despite solumedrol and hour long neb.  Will consult with medical service for admission for treatment of persistent asthma attack failing to respond to ED treatment.  I personally performed the services described in this documentation, which was scribed in my presence.  The recorded information has been reviewed and is accurate.    Linwood DibblesJon Brody Kump, MD 06/08/14 [phone]

## 2014-06-08 NOTE — ED Notes (Signed)
Per EMS pt reports asthma attack, onset 30 minutes ago, pt reports it woke him from his sleep. EMS adm 5mg  albuterol and 125 mg of SoluMedrol. Duoneb started upon arrival to deparment. Pt reports cough for one week.

## 2014-06-08 NOTE — ED Notes (Signed)
RT called and informed of continuous neb.  

## 2014-06-08 NOTE — Evaluation (Signed)
Recurrent asthma exacerbation. Was seen in ER within past 1-2 weeks for sxs. Persistent cough wheezing.  Got IV solumedrol by EMS. Had CAT. Still wheezing w/ mild increased WOB. Pending mag.  CXR WNL. Satting mid 90s on RA. Would like admission for continued treatment. Accepted to med surg bed.

## 2014-06-08 NOTE — Care Management Note (Signed)
    Page 1 of 1   06/09/2014     3:36:13 PM CARE MANAGEMENT NOTE 06/09/2014  Patient:  Devin [name],Devin [name]   Account Number:  [id number]  Date Initiated:  06/08/2014  Documentation initiated by:  Donn PieriniWEBSTER,Haley Roza  Subjective/Objective Assessment:   Pt admitted with asthma     Action/Plan:   PTA pt lived at home-   Anticipated DC Date:  06/09/2014   Anticipated DC Plan:  HOME/SELF CARE      DC Planning Services  CM consult  [facility]  Medication Assistance  Follow-up appt scheduled      Choice offered to / List presented to:             Status of service:  Completed, signed off Medicare Important Message given?   (If response is "NO", the following Medicare IM given date fields will be blank) Date Medicare IM given:   Medicare IM given by:   Date Additional Medicare IM given:   Additional Medicare IM given by:    Discharge Disposition:  HOME/SELF CARE  Per UR Regulation:  Reviewed for med. necessity/level of care/duration of stay  If discussed at Long Length of Stay Meetings, dates discussed:    Comments:  06/09/14- 1130- Donn PieriniKristi Gurinder Toral RN, BSN [phone] Pt for d/c home today, will give pt inhaler from here, has other script for inhaler and predisone- [facility]- pt states he should be ok on getting these two meds- [facility] at 10:00 with Dr. Tyna JakschAddani  06/08/14- 1100- Donn PieriniKristi Olufemi Mofield RN, BSN [phone] Referral for potential medication needs- spoke with pt at bedside per pt he does not have insurance and no PCP- pt does state that [facility] but was unable to keep appointment due to being sick- will arrange another [facility] for [facility] to [facility]- pt also reports that he has a nebulizer at home and has solution meds for this, pt however does not have any of his other meds. Will need his inhalers at discharge - CM will also look to see if will need MATCH for any medication  needs. pt will also need bus pass at discharge- will let CSW know of need.  NCM to continue to follow.

## 2014-06-08 NOTE — ED Notes (Signed)
Patient transported to X-ray 

## 2014-06-09 DIAGNOSIS — J45901 Unspecified asthma with (acute) exacerbation: Principal | ICD-10-CM

## 2014-06-09 DIAGNOSIS — D72829 Elevated white blood cell count, unspecified: Secondary | ICD-10-CM

## 2014-06-09 LAB — CBC
HEMATOCRIT: 43.6 % (ref 39.0–52.0)
HEMOGLOBIN: 14.8 g/dL (ref 13.0–17.0)
MCH: 31.4 pg (ref 26.0–34.0)
MCHC: 33.9 g/dL (ref 30.0–36.0)
MCV: 92.4 fL (ref 78.0–100.0)
Platelets: 293 10*3/uL (ref 150–400)
RBC: 4.72 MIL/uL (ref 4.22–5.81)
RDW: 13.2 % (ref 11.5–15.5)
WBC: 17 10*3/uL — ABNORMAL HIGH (ref 4.0–10.5)

## 2014-06-09 LAB — BASIC METABOLIC PANEL
ANION GAP: 8 (ref 5–15)
BUN: 15 mg/dL (ref 6–23)
CALCIUM: 9.6 mg/dL (ref 8.4–10.5)
CO2: 21 mmol/L (ref 19–32)
Chloride: 109 mmol/L (ref 96–112)
Creatinine, Ser: 0.93 mg/dL (ref 0.50–1.35)
GFR calc Af Amer: >90 mL/min (ref >90)
Glucose, Bld: 134 mg/dL — ABNORMAL HIGH (ref 70–99)
Potassium: 4.2 mmol/L (ref 3.5–5.1)
SODIUM: 138 mmol/L (ref 135–145)

## 2014-06-09 MED ORDER — ALBUTEROL SULFATE (2.5 MG/3ML) 0.083% IN NEBU: 2.5000 mg | INHALATION_SOLUTION | RESPIRATORY_TRACT | Status: DC | PRN

## 2014-06-09 MED ORDER — BUDESONIDE-FORMOTEROL FUMARATE 160-4.5 MCG/ACT IN AERO: 2.0000 | INHALATION_SPRAY | Freq: Two times a day (BID) | RESPIRATORY_TRACT | Status: DC

## 2014-06-09 MED ORDER — PREDNISONE 50 MG PO TABS
50.0000 mg | ORAL_TABLET | Freq: Every day | ORAL | Status: DC
  Administered 2014-06-09: 50 mg via ORAL
  Filled 2014-06-09 (×2): qty 1

## 2014-06-09 MED ORDER — ALBUTEROL SULFATE HFA 108 (90 BASE) MCG/ACT IN AERS: 2.0000 | INHALATION_SPRAY | RESPIRATORY_TRACT | Status: DC | PRN

## 2014-06-09 MED ORDER — PREDNISONE 10 MG PO TABS: ORAL_TABLET | ORAL | Status: DC

## 2014-06-09 NOTE — Discharge Summary (Signed)
Physician Discharge Summary  Devin KillingsMark [name] NWG:[id number] DOB: 01/05/1981 DOA: 06/08/2014  PCP: No PCP Per Patient  Admit date: 06/08/2014 Discharge date: 06/09/2014  Time spent: Less than 30 minutes  Recommendations for Outpatient Follow-up:  1. [facility]/PCP in 1 week. Case management will assist with appointment. 2. Consider outpatient pulmonology consultation.  Discharge Diagnoses:  Active Problems:   Status asthmaticus   Asthmatic bronchitis with exacerbation   Discharge Condition: Improved & Stable  Diet recommendation: Regular diet  Filed Weights   06/08/14 0353  Weight: 83.915 kg (185 lb)    History of present illness & Hospital course:  34 year old male with history of asthma since age 48 years, ADHD-not on treatment since age 34 years, presented to the ED with complaints of worsening dyspnea, nonproductive cough and wheezing. He had been recently seen in the ED on 05/24/14 for similar complaints and asthma exacerbation. He denies tobacco abuse or exposure to secondhand smoke. Chest x-ray was negative for acute findings. He was hospitalized and started on oxygen, IV Solu-Medrol, bronchodilators. He has done quite well and denies any dyspnea, cough or wheezing since a.m. of admission on 06/08/14. He is anxious to go home. Patient for be discharged on a short course of oral prednisone taper, will increase Symbicort to 2 puffs twice a day and patient may continue when necessary home albuterol nebulizers and albuterol inhaler while out of house. Consider outpatient pulmonology consultation to further evaluate his asthma and make medication adjustments. His asthma exacerbation seemed to be intermittent and precipitated by weather-related changes. Case management will [facility]. Not sure if he was truly hypoxic in the ED-had oxygen saturation to a low of 91%. Currently saturating in the high 90s on room air. Leukocytosis today is most  likely secondary to steroids. Influenza panel PCR: Negative   Consultations:  None  Procedures:  None    Discharge Exam:  Complaints:  Feels much better. Denies dyspnea, chest pain, cough or wheezing since yesterday morning while in ED. He is anxious to go home.  Filed Vitals:   06/08/14 0921 06/08/14 2032 06/09/14 0502 06/09/14 0832  BP:  123/82 119/76   Pulse:  99 69   Temp:  97.9 F (36.6 C) 97.8 F (36.6 C)   TempSrc:  Oral Oral   Resp:  20 18   Height:      Weight:      SpO2: 96% 96% 98% 98%    General exam: Pleasant young male sitting up comfortably in bed without distress. Respiratory system: Clear. No increased work of breathing. Cardiovascular system: S1 & S2 heard, RRR. No JVD, murmurs, gallops, clicks or pedal edema. Telemetry: SR-mild ST intermittently. Gastrointestinal system: Abdomen is nondistended, soft and nontender. Normal bowel sounds heard. Central nervous system: Alert and oriented. No focal neurological deficits. Extremities: Symmetric 5 x 5 power.  Discharge Instructions      Discharge Instructions    Activity as tolerated - No restrictions    Complete by:  As directed      Call MD for:  difficulty breathing, headache or visual disturbances    Complete by:  As directed      Call MD for:  temperature >100.4    Complete by:  As directed      Diet general    Complete by:  As directed             Medication List    TAKE these medications  albuterol (2.5 MG/3ML) 0.083% nebulizer solution  Commonly known as:  PROVENTIL  Take 3 mLs (2.5 mg total) by nebulization every 4 (four) hours as needed for wheezing or shortness of breath.     albuterol 108 (90 BASE) MCG/ACT inhaler  Commonly known as:  PROVENTIL HFA;VENTOLIN HFA  Inhale 2 puffs into the lungs every 4 (four) hours as needed for wheezing or shortness of breath (cough).     budesonide-formoterol 160-4.5 MCG/ACT inhaler  Commonly known as:  SYMBICORT  Inhale 2 puffs into  the lungs 2 (two) times daily.     predniSONE 10 MG tablet  Commonly known as:  DELTASONE  Start 06/10/14: Take 5 tabs daily 1 day, then 4 tabs daily 2 days, then 3 tabs daily 2 days, then 2 tabs daily 2 days, then 1 tab daily 2 days, then stop.       Follow-up Information    Follow up with [facility]. Schedule an appointment as soon as possible for a visit in 1 week.       The results of significant diagnostics from this hospitalization (including imaging, microbiology, ancillary and laboratory) are listed below for reference.    Significant Diagnostic Studies: Dg Chest 2 View  06/08/2014   CLINICAL DATA:  Dyspnea  EXAM: CHEST  2 VIEW  COMPARISON:  05/24/2014  FINDINGS: The heart size and mediastinal contours are within normal limits. Both lungs are clear. The visualized skeletal structures are unremarkable.  IMPRESSION: No active cardiopulmonary disease.   Electronically Signed   By: Ellery Plunk M.D.   On: 06/08/2014 04:20   Dg Chest Portable 1 View  05/24/2014   CLINICAL DATA:  Shortness of breath.  Cough.  EXAM: PORTABLE CHEST - 1 VIEW  COMPARISON:  03/14/2014  FINDINGS: The heart size and mediastinal contours are within normal limits. Both lungs are clear. The visualized skeletal structures are unremarkable.  IMPRESSION: Normal chest.   Electronically Signed   By: Geanie Cooley M.D.   On: 05/24/2014 14:16    Microbiology: No results found for this or any previous visit (from the past 240 hour(s)).   Labs: Basic Metabolic Panel:  Recent Labs Lab 06/08/14 0402 06/09/14 0619  NA 138 138  K 3.8 4.2  CL 106 109  CO2 22 21  GLUCOSE 97 134*  BUN 14 15  CREATININE 0.92 0.93  CALCIUM 9.3 9.6   Liver Function Tests: No results for input(s): AST, ALT, ALKPHOS, BILITOT, PROT, ALBUMIN in the last 168 hours. No results for input(s): LIPASE, AMYLASE in the last 168 hours. No results for input(s): AMMONIA in the last 168 hours. CBC:  Recent  Labs Lab 06/08/14 0402 06/09/14 0619  WBC 9.6 17.0*  NEUTROABS 3.6  --   HGB 15.4 14.8  HCT 44.4 43.6  MCV 89.7 92.4  PLT 310 293   Cardiac Enzymes: No results for input(s): CKTOTAL, CKMB, CKMBINDEX, TROPONINI in the last 168 hours. BNP: BNP (last 3 results) No results for input(s): BNP in the last 8760 hours.  ProBNP (last 3 results) No results for input(s): PROBNP in the last 8760 hours.  CBG: No results for input(s): GLUCAP in the last 168 hours.      Signed:  Marcellus Scott, MD, FACP, FHM. [facility] Pager [phone]  If 7PM-7AM, please contact night-coverage [email or web address] Password [email or web address] 06/09/2014, 9:54 AM

## 2014-06-09 NOTE — Discharge Instructions (Signed)
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
## 2014-06-09 NOTE — Progress Notes (Signed)
Chaplain responded to referral from social work. Chaplain introduced herself to pt. Pt reports no needs at this time, and preparing for discharge.    06/09/14 1100  Clinical Encounter Type  Visited With Patient  Visit Type Initial;Spiritual support  Referral From Social work  Stress Factors  Patient Stress Factors None identified  Hasten Sweitzer, Mayer MaskerCourtney F, Chaplain 06/09/2014 11:54 AM

## 2014-06-17 ENCOUNTER — Ambulatory Visit: Payer: Self-pay | Attending: Internal Medicine | Admitting: Internal Medicine

## 2014-06-17 ENCOUNTER — Encounter: Payer: Self-pay | Admitting: Internal Medicine

## 2014-06-17 VITALS — BP 143/94 | HR 75 | Temp 98.0°F | Resp 16 | Wt 193.0 lb

## 2014-06-17 DIAGNOSIS — Z79899 Other long term (current) drug therapy: Secondary | ICD-10-CM | POA: Insufficient documentation

## 2014-06-17 DIAGNOSIS — F909 Attention-deficit hyperactivity disorder, unspecified type: Secondary | ICD-10-CM | POA: Insufficient documentation

## 2014-06-17 DIAGNOSIS — R030 Elevated blood-pressure reading, without diagnosis of hypertension: Secondary | ICD-10-CM

## 2014-06-17 DIAGNOSIS — Z23 Encounter for immunization: Secondary | ICD-10-CM | POA: Insufficient documentation

## 2014-06-17 DIAGNOSIS — Z7952 Long term (current) use of systemic steroids: Secondary | ICD-10-CM | POA: Insufficient documentation

## 2014-06-17 DIAGNOSIS — IMO0001 Reserved for inherently not codable concepts without codable children: Secondary | ICD-10-CM

## 2014-06-17 DIAGNOSIS — I10 Essential (primary) hypertension: Secondary | ICD-10-CM | POA: Insufficient documentation

## 2014-06-17 DIAGNOSIS — J45909 Unspecified asthma, uncomplicated: Secondary | ICD-10-CM | POA: Insufficient documentation

## 2014-06-17 DIAGNOSIS — J4540 Moderate persistent asthma, uncomplicated: Secondary | ICD-10-CM

## 2014-06-17 DIAGNOSIS — Z139 Encounter for screening, unspecified: Secondary | ICD-10-CM

## 2014-06-17 NOTE — Patient Instructions (Signed)
DASH Eating Plan °DASH stands for "Dietary Approaches to Stop Hypertension." The DASH eating plan is a healthy eating plan that has been shown to reduce high blood pressure (hypertension). Additional health benefits may include reducing the risk of type 2 diabetes mellitus, heart disease, and stroke. The DASH eating plan may also help with weight loss. °WHAT DO I NEED TO KNOW ABOUT THE DASH EATING PLAN? °For the DASH eating plan, you will follow these general guidelines: °· Choose foods with a percent daily value for sodium of less than 5% (as listed on the food label). °· Use salt-free seasonings or herbs instead of table salt or sea salt. °· Check with your health care provider or pharmacist before using salt substitutes. °· Eat lower-sodium products, often labeled as "lower sodium" or "no salt added." °· Eat fresh foods. °· Eat more vegetables, fruits, and low-fat dairy products. °· Choose whole grains. Look for the word "whole" as the first word in the ingredient list. °· Choose fish and skinless chicken or turkey more often than red meat. Limit fish, poultry, and meat to 6 oz (170 g) each day. °· Limit sweets, desserts, sugars, and sugary drinks. °· Choose heart-healthy fats. °· Limit cheese to 1 oz (28 g) per day. °· Eat more home-cooked food and less restaurant, buffet, and fast food. °· Limit fried foods. °· Cook foods using methods other than frying. °· Limit canned vegetables. If you do use them, rinse them well to decrease the sodium. °· When eating at a restaurant, ask that your food be prepared with less salt, or no salt if possible. °WHAT FOODS CAN I EAT? °Seek help from a dietitian for individual calorie needs. °Grains °Whole grain or whole wheat bread. Brown rice. Whole grain or whole wheat pasta. Quinoa, bulgur, and whole grain cereals. Low-sodium cereals. Corn or whole wheat flour tortillas. Whole grain cornbread. Whole grain crackers. Low-sodium crackers. °Vegetables °Fresh or frozen vegetables  (raw, steamed, roasted, or grilled). Low-sodium or reduced-sodium tomato and vegetable juices. Low-sodium or reduced-sodium tomato sauce and paste. Low-sodium or reduced-sodium canned vegetables.  °Fruits °All fresh, canned (in natural juice), or frozen fruits. °Meat and Other Protein Products °Ground beef (85% or leaner), grass-fed beef, or beef trimmed of fat. Skinless chicken or turkey. Ground chicken or turkey. Pork trimmed of fat. All fish and seafood. Eggs. Dried beans, peas, or lentils. Unsalted nuts and seeds. Unsalted canned beans. °Dairy °Low-fat dairy products, such as skim or 1% milk, 2% or reduced-fat cheeses, low-fat ricotta or cottage cheese, or plain low-fat yogurt. Low-sodium or reduced-sodium cheeses. °Fats and Oils °Tub margarines without trans fats. Light or reduced-fat mayonnaise and salad dressings (reduced sodium). Avocado. Safflower, olive, or canola oils. Natural peanut or almond butter. °Other °Unsalted popcorn and pretzels. °The items listed above may not be a complete list of recommended foods or beverages. Contact your dietitian for more options. °WHAT FOODS ARE NOT RECOMMENDED? °Grains °White bread. White pasta. White rice. Refined cornbread. Bagels and croissants. Crackers that contain trans fat. °Vegetables °Creamed or fried vegetables. Vegetables in a cheese sauce. Regular canned vegetables. Regular canned tomato sauce and paste. Regular tomato and vegetable juices. °Fruits °Dried fruits. Canned fruit in light or heavy syrup. Fruit juice. °Meat and Other Protein Products °Fatty cuts of meat. Ribs, chicken wings, bacon, sausage, bologna, salami, chitterlings, fatback, hot dogs, bratwurst, and packaged luncheon meats. Salted nuts and seeds. Canned beans with salt. °Dairy °Whole or 2% milk, cream, half-and-half, and cream cheese. Whole-fat or sweetened yogurt. Full-fat   cheeses or blue cheese. Nondairy creamers and whipped toppings. Processed cheese, cheese spreads, or cheese  curds. °Condiments °Onion and garlic salt, seasoned salt, table salt, and sea salt. Canned and packaged gravies. Worcestershire sauce. Tartar sauce. Barbecue sauce. Teriyaki sauce. Soy sauce, including reduced sodium. Steak sauce. Fish sauce. Oyster sauce. Cocktail sauce. Horseradish. Ketchup and mustard. Meat flavorings and tenderizers. Bouillon cubes. Hot sauce. Tabasco sauce. Marinades. Taco seasonings. Relishes. °Fats and Oils °Butter, stick margarine, lard, shortening, ghee, and bacon fat. Coconut, palm kernel, or palm oils. Regular salad dressings. °Other °Pickles and olives. Salted popcorn and pretzels. °The items listed above may not be a complete list of foods and beverages to avoid. Contact your dietitian for more information. °WHERE CAN I FIND MORE INFORMATION? °National [employer], [employer], [employer] Institute: www.nhlbi.nih.gov/health/health-topics/topics/dash/ °Document Released: 03/28/2011 Document Revised: 08/23/2013 Document Reviewed: 02/10/2013 °ExitCare® Patient Information ©2015 [employer]. This information is not intended to replace advice given to you by your health care provider. Make sure you discuss any questions you have with your health care provider. ° °

## 2014-06-17 NOTE — Progress Notes (Signed)
Patient here for follow up from the hospital Was admitted for asthma exacerbation Patient states he is feeling much better

## 2014-06-17 NOTE — Progress Notes (Signed)
MRN: [id number] Name: Devin KillingsMark Galyon  Sex: male Age: 34 y.o. DOB: 01/11/1981  Allergies: Amoxicillin-pot clavulanate and Other  Chief Complaint  Patient presents with  . Hospitalization Follow-up    HPI: Patient is 34 y.o. male who has history of asthma since childhood, recently hospitalized with symptoms of worsening shortness of breath nonproductive cough and wheezing, EMR reviewed patient had a chest x-ray done which was negative for any acute findings, patient was treated with IV Solu-Medrol bronchodilators, was discharged on tapering dose of prednisone and is in the car was increased to 2 puffs twice a day, was given prescription for albuterol as per patient he needs assistance with her medications, currently reports improvement in the symptoms denies any fever chills wheezing.  Past Medical History  Diagnosis Date  . Asthma   . ADHD (attention deficit hyperactivity disorder)     Past Surgical History  Procedure Laterality Date  . Tympanoplasty        Medication List       This list is accurate as of: 06/17/14 10:37 AM.  Always use your most recent med list.               albuterol (2.5 MG/3ML) 0.083% nebulizer solution  Commonly known as:  PROVENTIL  Take 3 mLs (2.5 mg total) by nebulization every 4 (four) hours as needed for wheezing or shortness of breath.     albuterol 108 (90 BASE) MCG/ACT inhaler  Commonly known as:  PROVENTIL HFA;VENTOLIN HFA  Inhale 2 puffs into the lungs every 4 (four) hours as needed for wheezing or shortness of breath (cough).     budesonide-formoterol 160-4.5 MCG/ACT inhaler  Commonly known as:  SYMBICORT  Inhale 2 puffs into the lungs 2 (two) times daily.     predniSONE 10 MG tablet  Commonly known as:  DELTASONE  Start 06/10/14: Take 5 tabs daily 1 day, then 4 tabs daily 2 days, then 3 tabs daily 2 days, then 2 tabs daily 2 days, then 1 tab daily 2 days, then stop.        No orders of the defined types were placed in this  encounter.    Immunization History  Administered Date(s) Administered  . Pneumococcal Polysaccharide-23 10/16/2011    Family History  Problem Relation Age of Onset  . Heart failure Mother   . Hyperlipidemia Mother   . Hypertension Mother   . Migraines Mother   . COPD Mother   . Coronary artery disease Father   . Heart attack Father   . Heart disease Father   . Diabetes Maternal Aunt   . Asthma Maternal Grandfather   . Asthma Paternal Grandfather     History  Substance Use Topics  . Smoking status: Never Smoker   . Smokeless tobacco: Never Used  . Alcohol Use: Yes     Comment: occasionally    Review of Systems   As noted in HPI  Filed Vitals:   06/17/14 1012  BP: 143/94  Pulse: 75  Temp: 98 F (36.7 C)  Resp: 16    Physical Exam  Physical Exam  Constitutional: He is oriented to person, place, and time. No distress.  Eyes: EOM are normal. Pupils are equal, round, and reactive to light.  Cardiovascular: Normal rate and regular rhythm.   Pulmonary/Chest: Breath sounds normal. No respiratory distress. He has no wheezes. He has no rales.  Abdominal: Soft. There is no tenderness.  Musculoskeletal: He exhibits no edema.  Neurological: He is alert and  oriented to person, place, and time.    CBC    Component Value Date/Time   WBC 17.0* 06/09/2014 0619   RBC 4.72 06/09/2014 0619   HGB 14.8 06/09/2014 0619   HCT 43.6 06/09/2014 0619   PLT 293 06/09/2014 0619   MCV 92.4 06/09/2014 0619   LYMPHSABS 4.2* 06/08/2014 0402   MONOABS 1.0 06/08/2014 0402   EOSABS 0.8* 06/08/2014 0402   BASOSABS 0.0 06/08/2014 0402    CMP     Component Value Date/Time   NA 138 06/09/2014 0619   K 4.2 06/09/2014 0619   CL 109 06/09/2014 0619   CO2 21 06/09/2014 0619   GLUCOSE 134* 06/09/2014 0619   BUN 15 06/09/2014 0619   CREATININE 0.93 06/09/2014 0619   CALCIUM 9.6 06/09/2014 0619   PROT 6.7 09/22/2011 1618   ALBUMIN 3.9 09/22/2011 1618   AST 21 09/22/2011 1618   ALT  31 09/22/2011 1618   ALKPHOS 41 09/22/2011 1618   BILITOT 0.5 09/22/2011 1618   GFRNONAA >90 06/09/2014 0619   GFRAA >90 06/09/2014 0619    No results found for: CHOL  No components found for: HGA1C  Lab Results  Component Value Date/Time   AST 21 09/22/2011 04:18 PM    Assessment and Plan  Asthma, chronic, moderate persistent, uncomplicated Patient is currently on Symbicort, will use albuterol when necessary. Patient denies smoking cigarettes.  Needs flu shot Flu shot given today.  Elevated BP Advise patient for DASH diet.  Screening - Plan: ordered baseline blood work, patient would like to come back for blood test. Lipid panel, CBC with Differential/Platelet, COMPLETE METABOLIC PANEL WITH GFR, TSH, Vit D  25 hydroxy (rtn osteoporosis monitoring)   Health Maintenance  -Vaccinations:  uptodate with pneumovax flu shot today   Return in about 3 months (around 09/15/2014), or if symptoms worsen or fail to improve, for asthma.   This note has been created with Education officer, environmental. Any transcriptional errors are unintentional.    Doris Cheadle, MD

## 2014-07-09 ENCOUNTER — Encounter (HOSPITAL_COMMUNITY): Payer: Self-pay

## 2014-07-09 ENCOUNTER — Other Ambulatory Visit: Payer: Self-pay

## 2014-07-09 ENCOUNTER — Emergency Department (HOSPITAL_COMMUNITY): Admission: EM | Admit: 2014-07-09 | Discharge: 2014-07-09 | Discharge status: Absent without leave | Payer: MEDICAID

## 2014-07-09 ENCOUNTER — Emergency Department (HOSPITAL_COMMUNITY)
Admission: EM | Admit: 2014-07-09 | Discharge: 2014-07-09 | Disposition: A | Discharge status: Home / Self Care | Payer: Self-pay | Attending: Emergency Medicine | Admitting: Emergency Medicine

## 2014-07-09 DIAGNOSIS — J45901 Unspecified asthma with (acute) exacerbation: Secondary | ICD-10-CM

## 2014-07-09 DIAGNOSIS — Z8659 Personal history of other mental and behavioral disorders: Secondary | ICD-10-CM | POA: Insufficient documentation

## 2014-07-09 DIAGNOSIS — Z7951 Long term (current) use of inhaled steroids: Secondary | ICD-10-CM | POA: Insufficient documentation

## 2014-07-09 DIAGNOSIS — Z79899 Other long term (current) drug therapy: Secondary | ICD-10-CM | POA: Insufficient documentation

## 2014-07-09 DIAGNOSIS — Z880 Allergy status to penicillin: Secondary | ICD-10-CM | POA: Insufficient documentation

## 2014-07-09 MED ORDER — ALBUTEROL SULFATE (2.5 MG/3ML) 0.083% IN NEBU: 2.5000 mg | INHALATION_SOLUTION | RESPIRATORY_TRACT | Status: DC | PRN

## 2014-07-09 MED ORDER — ALBUTEROL SULFATE HFA 108 (90 BASE) MCG/ACT IN AERS: 1.0000 | INHALATION_SPRAY | RESPIRATORY_TRACT | Status: DC | PRN

## 2014-07-09 MED ORDER — BUDESONIDE-FORMOTEROL FUMARATE 160-4.5 MCG/ACT IN AERO: 2.0000 | INHALATION_SPRAY | Freq: Two times a day (BID) | RESPIRATORY_TRACT | Status: DC

## 2014-07-09 MED ORDER — PREDNISONE 20 MG PO TABS: 60.0000 mg | ORAL_TABLET | Freq: Every day | ORAL | Status: DC

## 2014-07-09 MED ORDER — ALBUTEROL SULFATE HFA 108 (90 BASE) MCG/ACT IN AERS
1.0000 | INHALATION_SPRAY | RESPIRATORY_TRACT | Status: DC | PRN
Start: 2014-07-09 — End: 2014-07-09
  Administered 2014-07-09: 2 via RESPIRATORY_TRACT
  Filled 2014-07-09: qty 6.7

## 2014-07-09 NOTE — ED Provider Notes (Signed)
CSN: [id number]     Arrival date & time 07/09/14  0050 History  This chart was scribed for Marisa Severinlga Polette Nofsinger, MD by Tanda RockersMargaux Venter, ED Scribe. [facility] and the patient's care was started at 2:17 AM.    Chief Complaint  Patient presents with  . Asthma   The history is provided by the patient. No language interpreter was used.     HPI Comments: Devin KillingsMark [name] is a 34 y.o. male brought in by ambulance, with PMHx of asthma who presents to the Emergency Department complaining of asthma exacerbation that began earlier today. Pt reports he had a coughing spell earlier tonight which exacerbated his asthma. He admits to smoking cigarettes but is trying to quit. He also complains of chest tightness. He states that he is still feeling tight in his chest but that he feels better otherwise. He denies any other symptoms.    Past Medical History  Diagnosis Date  . Asthma   . ADHD (attention deficit hyperactivity disorder)    Past Surgical History  Procedure Laterality Date  . Tympanoplasty     Family History  Problem Relation Age of Onset  . Heart failure Mother   . Hyperlipidemia Mother   . Hypertension Mother   . Migraines Mother   . COPD Mother   . Coronary artery disease Father   . Heart attack Father   . Heart disease Father   . Diabetes Maternal Aunt   . Asthma Maternal Grandfather   . Asthma Paternal Grandfather    History  Substance Use Topics  . Smoking status: Never Smoker   . Smokeless tobacco: Never Used  . Alcohol Use: Yes     Comment: occasionally    Review of Systems  Respiratory: Positive for cough, chest tightness and shortness of breath.   All other systems reviewed and are negative.     Allergies  Amoxicillin-pot clavulanate and Other  Home Medications   Prior to Admission medications   Medication Sig Start Date End Date Taking? Authorizing Provider  albuterol (PROVENTIL) (2.5 MG/3ML) 0.083% nebulizer solution Take 3 mLs (2.5 mg  total) by nebulization every 4 (four) hours as needed for wheezing or shortness of breath. 02/21/14  Yes Dione Boozeavid Glick, MD  budesonide-formoterol [facility](SYMBICORT) 160-4.5 MCG/ACT inhaler Inhale 2 puffs into the lungs 2 (two) times daily. 06/09/14  Yes Elease EtienneAnand D Hongalgi, MD  albuterol (PROVENTIL HFA;VENTOLIN HFA) 108 (90 BASE) MCG/ACT inhaler Inhale 2 puffs into the lungs every 4 (four) hours as needed for wheezing or shortness of breath (cough). Patient not taking: Reported on 07/09/2014 06/09/14   Elease EtienneAnand D Hongalgi, MD  predniSONE (DELTASONE) 10 MG tablet Start 06/10/14: Take 5 tabs daily 1 day, then 4 tabs daily 2 days, then 3 tabs daily 2 days, then 2 tabs daily 2 days, then 1 tab daily 2 days, then stop. Patient not taking: Reported on 07/09/2014 06/09/14   Elease EtienneAnand D Hongalgi, MD   Triage Vitals: BP 125/87 mmHg  Temp(Src) 98.1 F (36.7 C) (Oral)  Resp 22  Ht 5\' 9"  (1.753 m)  Wt 189 lb (85.73 kg)  BMI 27.90 kg/m2  SpO2 96%   Physical Exam  Constitutional: He is oriented to person, place, and time. He appears well-developed and well-nourished.  HENT:  Head: Normocephalic and atraumatic.  Nose: Nose normal.  Mouth/Throat: Oropharynx is clear and moist.  Eyes: Conjunctivae and EOM are normal. Pupils are equal, round, and reactive to light.  Neck: Normal range of motion. Neck supple.  No JVD present. No tracheal deviation present. No thyromegaly present.  Cardiovascular: Normal rate, regular rhythm, normal heart sounds and intact distal pulses.  Exam reveals no gallop and no friction rub.   No murmur heard. Pulmonary/Chest: Effort normal. No stridor. No respiratory distress. He has wheezes. He has no rales. He exhibits no tenderness.  Abdominal: Soft. Bowel sounds are normal. He exhibits no distension and no mass. There is no tenderness. There is no rebound and no guarding.  Musculoskeletal: Normal range of motion. He exhibits no edema or tenderness.  Lymphadenopathy:    He has no cervical adenopathy.   Neurological: He is alert and oriented to person, place, and time. He displays normal reflexes. He exhibits normal muscle tone. Coordination normal.  Skin: Skin is warm and dry. No rash noted. No erythema. No pallor.  Psychiatric: He has a normal mood and affect. His behavior is normal. Judgment and thought content normal.  Nursing note and vitals reviewed.   ED Course  Procedures (including critical care time)  DIAGNOSTIC STUDIES: Oxygen Saturation is 96% on RA, normal by my interpretation.    COORDINATION OF CARE: 2:19 AM-Discussed treatment plan which includes EKG, albuterol inhaler, Prednisone prescription with pt at bedside and pt agreed to plan.   Labs Review Labs Reviewed - No data to display  Imaging Review No results found.   EKG Interpretation   Date/Time:  Saturday July 09 2014 01:06:21 EDT Ventricular Rate:  102 PR Interval:  165 QRS Duration: 92 QT Interval:  346 QTC Calculation: 451 R Axis:   59 Text Interpretation:  Sinus tachycardia Ventricular premature complex  Consider left atrial enlargement No significant change since last tracing  Confirmed by Ryllie Nieland  MD, Shawndale Kilpatrick ([id number]) on 07/09/2014 2:20:02 AM      MDM   Final diagnoses:  Asthma exacerbation    I personally performed the services described in this documentation, which was scribed in my presence. The recorded information has been reviewed and is accurate.  34 year old male history of asthma with asthma exacerbation.  Patient has received steroids.  He has some and expiratory wheezing, but sats have been good here.  He dropped slightly with ambulation.  Patient is stable for discharge home.  He will placed on steroids and given albuterol inhaler for home use as well as his nebulizer machine.     Marisa Severin, MD 07/09/14 [phone]

## 2014-07-09 NOTE — Discharge Instructions (Signed)
Asthma, Acute Bronchospasm Acute bronchospasm caused by asthma is also referred to as an asthma attack. Bronchospasm means your air passages become narrowed. The narrowing is caused by inflammation and tightening of the muscles in the air tubes (bronchi) in your lungs. This can make it hard to breathe or cause you to wheeze and cough. CAUSES Possible triggers are: 1. Animal dander from the skin, hair, or feathers of animals. 2. Dust mites contained in house dust. 3. Cockroaches. 4. Pollen from trees or grass. 5. Mold. 6. Cigarette or tobacco smoke. 7. Air pollutants such as dust, household cleaners, hair sprays, aerosol sprays, paint fumes, strong chemicals, or strong odors. 8. Cold air or weather changes. Cold air may trigger inflammation. Winds increase molds and pollens in the air. 9. Strong emotions such as crying or laughing hard. 10. Stress. 11. Certain medicines such as aspirin or beta-blockers. 12. Sulfites in foods and drinks, such as dried fruits and wine. 13. Infections or inflammatory conditions, such as a flu, cold, or inflammation of the nasal membranes (rhinitis). 14. Gastroesophageal reflux disease (GERD). GERD is a condition where stomach acid backs up into your esophagus. 15. Exercise or strenuous activity. SIGNS AND SYMPTOMS   Wheezing.  Excessive coughing, particularly at night.  Chest tightness.  Shortness of breath. DIAGNOSIS  Your health care provider will ask you about your medical history and perform a physical exam. A chest X-ray or blood testing may be performed to look for other causes of your symptoms or other conditions that may have triggered your asthma attack. TREATMENT  Treatment is aimed at reducing inflammation and opening up the airways in your lungs. Most asthma attacks are treated with inhaled medicines. These include quick relief or rescue medicines (such as bronchodilators) and controller medicines (such as inhaled corticosteroids). These  medicines are sometimes given through an inhaler or a nebulizer. Systemic steroid medicine taken by mouth or given through an IV tube also can be used to reduce the inflammation when an attack is moderate or severe. Antibiotic medicines are only used if a bacterial infection is present.  HOME CARE INSTRUCTIONS   Rest.  Drink plenty of liquids. This helps the mucus to remain thin and be easily coughed up. Only use caffeine in moderation and do not use alcohol until you have recovered from your illness.  Do not smoke. Avoid being exposed to secondhand smoke.  You play a critical role in keeping yourself in good health. Avoid exposure to things that cause you to wheeze or to have breathing problems.  Keep your medicines up-to-date and available. Carefully follow your health care provider's treatment plan.  Take your medicine exactly as prescribed.  When pollen or pollution is bad, keep windows closed and use an air conditioner or go to places with air conditioning.  Asthma requires careful medical care. See your health care provider for a follow-up as advised. If you are more than [redacted] weeks pregnant and you were prescribed any new medicines, let your obstetrician know about the visit and how you are doing. Follow up with your health care provider as directed.  After you have recovered from your asthma attack, make an appointment with your outpatient doctor to talk about ways to reduce the likelihood of future attacks. If you do not have a doctor who manages your asthma, make an appointment with a primary care doctor to discuss your asthma. SEEK IMMEDIATE MEDICAL CARE IF:   You are getting worse.  You have trouble breathing. If severe, call your local  emergency services (911 in the [place]  You develop chest pain or discomfort.  You are vomiting.  You are not able to keep fluids down.  You are coughing up yellow, green, brown, or bloody sputum.  You have a fever and your symptoms suddenly  get worse.  You have trouble swallowing. MAKE SURE YOU:   Understand these instructions.  Will watch your condition.  Will get help right away if you are not doing well or get worse. Document Released: 07/24/2006 Document Revised: 04/13/2013 Document Reviewed: 10/14/2012 [facility] Patient Information 2015 [place], [place]. This information is not intended to replace advice given to you by your health care provider. Make sure you discuss any questions you have with your health care provider.  How to Use an Inhaler Using your inhaler correctly is very important. Good technique will make sure that the medicine reaches your lungs.  HOW TO USE AN INHALER: 16. Take the cap off the inhaler. 17. If this is the first time using your inhaler, you need to prime it. Shake the inhaler for 5 seconds. Release four puffs into the air, away from your face. Ask your doctor for help if you have questions. 18. Shake the inhaler for 5 seconds. 19. Turn the inhaler so the bottle is above the mouthpiece. 20. Put your pointer finger on top of the bottle. Your thumb holds the bottom of the inhaler. 21. Open your mouth. 22. Either hold the inhaler away from your mouth (the width of 2 fingers) or place your lips tightly around the mouthpiece. Ask your doctor which way to use your inhaler. 23. Breathe out as much air as possible. 24. Breathe in and push down on the bottle 1 time to release the medicine. You will feel the medicine go in your mouth and throat. 25. Continue to take a deep breath in very slowly. Try to fill your lungs. 26. After you have breathed in completely, hold your breath for 10 seconds. This will help the medicine to settle in your lungs. If you cannot hold your breath for 10 seconds, hold it for as long as you can before you breathe out. 27. Breathe out slowly, through pursed lips. Whistling is an example of pursed lips. 28. If your doctor has told you to take more than 1 puff, wait at least 15-30  seconds between puffs. This will help you get the best results from your medicine. Do not use the inhaler more than your doctor tells you to. 29. Put the cap back on the inhaler. 30. Follow the directions from your doctor or from the inhaler package about cleaning the inhaler. If you use more than one inhaler, ask your doctor which inhalers to use and what order to use them in. Ask your doctor to help you figure out when you will need to refill your inhaler.  If you use a steroid inhaler, always rinse your mouth with water after your last puff, gargle and spit out the water. Do not swallow the water. GET HELP IF:  The inhaler medicine only partially helps to stop wheezing or shortness of breath.  You are having trouble using your inhaler.  You have some increase in thick spit (phlegm). GET HELP RIGHT AWAY IF:  The inhaler medicine does not help your wheezing or shortness of breath or you have tightness in your chest.  You have dizziness, headaches, or fast heart rate.  You have chills, fever, or night sweats.  You have a large increase of thick spit, or  your thick spit is bloody. MAKE SURE YOU:   Understand these instructions.  Will watch your condition.  Will get help right away if you are not doing well or get worse. Document Released: 01/16/2008 Document Revised: 01/27/2013 Document Reviewed: 11/05/2012 [facility] Patient Information 2015 [place], [place]. This information is not intended to replace advice given to you by your health care provider. Make sure you discuss any questions you have with your health care provider.

## 2014-07-09 NOTE — ED Notes (Signed)
Pt ambulatory around pod; sats dropped from 98% to 93% after returning to room. Reports increased tightness in chest after ambulating

## 2014-07-09 NOTE — ED Notes (Signed)
PER EMS: pt from home with reports of asthma exacerbation. Inspiratory and expiratory wheezing, RR-24. Room air sats 98% but ems reports he wasn't moving much air so then EMS administered one duoneb en route and 125 solumedrol. BP-142/98, HR-114. One hour prior to this EMS truck treating patient, another truck went out and administered a duoneb and the patient went to go to bed but woke up from a 'coughing fit" and couldn't catch his breath so he called EMS again.  

## 2014-07-09 NOTE — ED Notes (Signed)
PER EMS: pt from home with reports of asthma exacerbation. Inspiratory and expiratory wheezing, RR-24. Room air sats 98% but ems reports he wasn't moving much air so then EMS administered one duoneb en route and 125 solumedrol. BP-142/98, HR-114. One hour prior to this EMS truck treating patient, another truck went out and administered a duoneb and the patient went to go to bed but woke up from a 'coughing fit" and couldn't catch his breath so he called EMS again.

## 2014-07-11 ENCOUNTER — Other Ambulatory Visit: Payer: Self-pay

## 2014-07-11 ENCOUNTER — Ambulatory Visit: Payer: Self-pay

## 2014-07-22 ENCOUNTER — Emergency Department (HOSPITAL_COMMUNITY)
Admission: EM | Admit: 2014-07-22 | Discharge: 2014-07-22 | Disposition: A | Discharge status: Home / Self Care | Payer: Self-pay | Attending: Emergency Medicine | Admitting: Emergency Medicine

## 2014-07-22 ENCOUNTER — Emergency Department (HOSPITAL_COMMUNITY): Payer: Self-pay

## 2014-07-22 ENCOUNTER — Encounter (HOSPITAL_COMMUNITY): Payer: Self-pay | Admitting: *Deleted

## 2014-07-22 DIAGNOSIS — Z8659 Personal history of other mental and behavioral disorders: Secondary | ICD-10-CM | POA: Insufficient documentation

## 2014-07-22 DIAGNOSIS — Z7952 Long term (current) use of systemic steroids: Secondary | ICD-10-CM | POA: Insufficient documentation

## 2014-07-22 DIAGNOSIS — Z7951 Long term (current) use of inhaled steroids: Secondary | ICD-10-CM | POA: Insufficient documentation

## 2014-07-22 DIAGNOSIS — J45901 Unspecified asthma with (acute) exacerbation: Secondary | ICD-10-CM | POA: Insufficient documentation

## 2014-07-22 DIAGNOSIS — Z79899 Other long term (current) drug therapy: Secondary | ICD-10-CM | POA: Insufficient documentation

## 2014-07-22 LAB — CBC
HCT: 39 % (ref 39.0–52.0)
Hemoglobin: 13.5 g/dL (ref 13.0–17.0)
MCH: 30.9 pg (ref 26.0–34.0)
MCHC: 34.6 g/dL (ref 30.0–36.0)
MCV: 89.2 fL (ref 78.0–100.0)
PLATELETS: 287 10*3/uL (ref 150–400)
RBC: 4.37 MIL/uL (ref 4.22–5.81)
RDW: 12.7 % (ref 11.5–15.5)
WBC: 14.4 10*3/uL — ABNORMAL HIGH (ref 4.0–10.5)

## 2014-07-22 LAB — BASIC METABOLIC PANEL
ANION GAP: 9 (ref 5–15)
BUN: 11 mg/dL (ref 6–23)
CALCIUM: 8.9 mg/dL (ref 8.4–10.5)
CO2: 23 mmol/L (ref 19–32)
Chloride: 105 mmol/L (ref 96–112)
Creatinine, Ser: 0.96 mg/dL (ref 0.50–1.35)
GFR calc Af Amer: >90 mL/min (ref >90)
GFR calc non Af Amer: >90 mL/min (ref >90)
Glucose, Bld: 109 mg/dL — ABNORMAL HIGH (ref 70–99)
Potassium: 3.5 mmol/L (ref 3.5–5.1)
Sodium: 137 mmol/L (ref 135–145)

## 2014-07-22 MED ORDER — IPRATROPIUM BROMIDE 0.02 % IN SOLN
0.5000 mg | Freq: Once | RESPIRATORY_TRACT | Status: AC
  Administered 2014-07-22: 0.5 mg via RESPIRATORY_TRACT
  Filled 2014-07-22 (×2): qty 2.5

## 2014-07-22 MED ORDER — PREDNISONE 20 MG PO TABS
60.0000 mg | ORAL_TABLET | Freq: Once | ORAL | Status: AC
  Administered 2014-07-22: 60 mg via ORAL
  Filled 2014-07-22: qty 3

## 2014-07-22 MED ORDER — ALBUTEROL (5 MG/ML) CONTINUOUS INHALATION SOLN
10.0000 mg/h | INHALATION_SOLUTION | Freq: Once | RESPIRATORY_TRACT | Status: AC
  Administered 2014-07-22: 10 mg/h via RESPIRATORY_TRACT
  Filled 2014-07-22: qty 20

## 2014-07-22 MED ORDER — ALBUTEROL SULFATE HFA 108 (90 BASE) MCG/ACT IN AERS
2.0000 | INHALATION_SPRAY | Freq: Once | RESPIRATORY_TRACT | Status: AC
  Administered 2014-07-22: 2 via RESPIRATORY_TRACT
  Filled 2014-07-22: qty 6.7

## 2014-07-22 MED ORDER — ALBUTEROL SULFATE HFA 108 (90 BASE) MCG/ACT IN AERS: 2.0000 | INHALATION_SPRAY | Freq: Four times a day (QID) | RESPIRATORY_TRACT | Status: DC | PRN

## 2014-07-22 MED ORDER — PREDNISONE 20 MG PO TABS: 60.0000 mg | ORAL_TABLET | Freq: Every day | ORAL | Status: DC

## 2014-07-22 NOTE — ED Provider Notes (Signed)
CSN: [id number]     Arrival date & time 07/22/14  1101 History   First MD Initiated Contact with Patient 07/22/14 1102     Chief Complaint  Patient presents with  . Shortness of Breath     (Consider location/radiation/quality/duration/timing/severity/associated sxs/prior Treatment) Patient is a 34 y.o. male presenting with shortness of breath.  Shortness of Breath Severity:  Moderate Onset quality:  Gradual Timing:  Constant Progression:  Worsening Chronicity:  Recurrent Context: URI and weather changes   Relieved by:  Nothing Worsened by:  Nothing tried Ineffective treatments: home nebulizers. Associated symptoms: cough   Associated symptoms: no abdominal pain, no fever and no vomiting     Past Medical History  Diagnosis Date  . Asthma   . ADHD (attention deficit hyperactivity disorder)    Past Surgical History  Procedure Laterality Date  . Tympanoplasty     Family History  Problem Relation Age of Onset  . Heart failure Mother   . Hyperlipidemia Mother   . Hypertension Mother   . Migraines Mother   . COPD Mother   . Coronary artery disease Father   . Heart attack Father   . Heart disease Father   . Diabetes Maternal Aunt   . Asthma Maternal Grandfather   . Asthma Paternal Grandfather    History  Substance Use Topics  . Smoking status: Never Smoker   . Smokeless tobacco: Never Used  . Alcohol Use: Yes     Comment: occasionally    Review of Systems  Constitutional: Negative for fever.  Respiratory: Positive for cough and shortness of breath.   Gastrointestinal: Negative for vomiting and abdominal pain.  All other systems reviewed and are negative.     Allergies  Amoxicillin-pot clavulanate and Other  Home Medications   Prior to Admission medications   Medication Sig Start Date End Date Taking? Authorizing Provider  albuterol (PROVENTIL HFA;VENTOLIN HFA) 108 (90 BASE) MCG/ACT inhaler Inhale 2 puffs into the lungs every 4 (four) hours as needed for  wheezing or shortness of breath (cough). Patient not taking: Reported on 07/09/2014 06/09/14   Elease EtienneAnand D Hongalgi, MD  albuterol (PROVENTIL HFA;VENTOLIN HFA) 108 (90 BASE) MCG/ACT inhaler Inhale 1-2 puffs into the lungs every 4 (four) hours as needed for wheezing or shortness of breath. 07/09/14   Marisa Severinlga Otter, MD  albuterol (PROVENTIL HFA;VENTOLIN HFA) 108 (90 BASE) MCG/ACT inhaler Inhale 2 puffs into the lungs every 6 (six) hours as needed for wheezing or shortness of breath. 07/22/14   Elwin MochaBlair Mahayla Haddaway, MD  albuterol (PROVENTIL) (2.5 MG/3ML) 0.083% nebulizer solution Take 3 mLs (2.5 mg total) by nebulization every 4 (four) hours as needed for wheezing or shortness of breath. 07/09/14   Marisa Severinlga Otter, MD  budesonide-formoterol [facility](SYMBICORT) 160-4.5 MCG/ACT inhaler Inhale 2 puffs into the lungs 2 (two) times daily. 07/09/14   Marisa Severinlga Otter, MD  predniSONE (DELTASONE) 20 MG tablet Take 3 tablets (60 mg total) by mouth daily. 07/09/14   Marisa Severinlga Otter, MD  predniSONE (DELTASONE) 20 MG tablet Take 3 tablets (60 mg total) by mouth daily. 07/23/14   Elwin MochaBlair Sayre Mazor, MD   BP 139/77 mmHg  Pulse 83  Temp(Src) 98.1 F (36.7 C) (Oral)  Resp 22  Ht 5\' 10"  (1.778 m)  Wt 189 lb (85.73 kg)  BMI 27.12 kg/m2  SpO2 96% Physical Exam  Constitutional: He is oriented to person, place, and time. He appears well-developed and well-nourished. No distress.  HENT:  Head: Normocephalic and atraumatic.  Mouth/Throat: Oropharynx is clear and moist. No  oropharyngeal exudate.  Eyes: EOM are normal. Pupils are equal, round, and reactive to light.  Neck: Normal range of motion. Neck supple.  Cardiovascular: Normal rate and regular rhythm.  Exam reveals no friction rub.   No murmur heard. Pulmonary/Chest: He is in respiratory distress (mild, prolonged expiratory phase). He has wheezes (diffuse, expiratory). He has no rales.  Abdominal: Soft. He exhibits no distension. There is no tenderness. There is no rebound.  Musculoskeletal: Normal range of  motion. He exhibits no edema.  Neurological: He is alert and oriented to person, place, and time.  Skin: Skin is warm. No rash noted. He is not diaphoretic.  Nursing note and vitals reviewed.   ED Course  Procedures (including critical care time) Labs Review Labs Reviewed  CBC - Abnormal; Notable for the following:    WBC 14.4 (*)    All other components within normal limits  BASIC METABOLIC PANEL - Abnormal; Notable for the following:    Glucose, Bld 109 (*)    All other components within normal limits    Imaging Review Dg Chest Portable 1 View  07/22/2014   CLINICAL DATA:  Shortness of breath and asthma  EXAM: PORTABLE CHEST - 1 VIEW  COMPARISON:  June 08, 2014  FINDINGS: Lungs are clear. Heart size and pulmonary vascularity are normal. No adenopathy. No bone lesions.  IMPRESSION: No edema or consolidation.   Electronically Signed   By: Bretta Bang III M.D.   On: 07/22/2014 11:39     EKG Interpretation   Date/Time:  Friday July 22 2014 11:04:42 EDT Ventricular Rate:  84 PR Interval:  155 QRS Duration: 92 QT Interval:  369 QTC Calculation: 436 R Axis:   56 Text Interpretation:  Sinus rhythm Similar to prior Confirmed by Gwendolyn Grant   MD, Alyze Lauf ([id number]) on 07/22/2014 12:31:26 PM      MDM   Final diagnoses:  Asthma exacerbation    34 year old male with history of asthma. EMS was called his house last night but he refused transfer. Progressive worsening since then. Had a cough yesterday morning with some sore throat. Mild productivity to cough. Here with diffuse wheezing, secretory but occasionally answer to worry. No fevers. He does have prolonged story phase. He's talking in complete sentences but with her speech. Will give an hour-long nebulizer. He received steroids prior to arrival. History of admissions for asthma. Greatly improved symptoms after duoneb. Patient relaxing comfortably, no respiratory distress. Given inhaler to go home, given Rx for steroids. Stable  for discharge.  Elwin Mocha, MD 07/22/14 [phone]

## 2014-07-22 NOTE — ED Notes (Signed)
[facility] per report pt was seen by EMS last night for [place] refused transfer @ 23:00 yesterday, this am pt used 5 mg Albuterol neb with no improvement & called EMS, pt a&O x4, follows commands, speaks in complete sentences, pt rcvd 10 mg Albuterol, 0.5 mg Atrovent & 125 mg Solumedrol, expiratory wheezes present

## 2014-07-22 NOTE — Discharge Instructions (Signed)

## 2014-07-22 NOTE — ED Notes (Signed)
Patient states SOB started at about 11:00 last night. Patient denies chest pain and denies pain anywhere. Patient is completing breathing treatment given by EMS. Patient is alert and oriented x4.

## 2014-08-04 ENCOUNTER — Encounter (HOSPITAL_COMMUNITY): Payer: Self-pay

## 2014-08-04 ENCOUNTER — Emergency Department (HOSPITAL_COMMUNITY)
Admission: EM | Admit: 2014-08-04 | Discharge: 2014-08-04 | Disposition: A | Discharge status: Home / Self Care | Payer: Self-pay | Attending: Emergency Medicine | Admitting: Emergency Medicine

## 2014-08-04 DIAGNOSIS — Z7951 Long term (current) use of inhaled steroids: Secondary | ICD-10-CM | POA: Insufficient documentation

## 2014-08-04 DIAGNOSIS — Z8659 Personal history of other mental and behavioral disorders: Secondary | ICD-10-CM | POA: Insufficient documentation

## 2014-08-04 DIAGNOSIS — Z7952 Long term (current) use of systemic steroids: Secondary | ICD-10-CM | POA: Insufficient documentation

## 2014-08-04 DIAGNOSIS — J45901 Unspecified asthma with (acute) exacerbation: Secondary | ICD-10-CM | POA: Insufficient documentation

## 2014-08-04 DIAGNOSIS — Z880 Allergy status to penicillin: Secondary | ICD-10-CM | POA: Insufficient documentation

## 2014-08-04 DIAGNOSIS — Z79899 Other long term (current) drug therapy: Secondary | ICD-10-CM | POA: Insufficient documentation

## 2014-08-04 MED ORDER — IPRATROPIUM-ALBUTEROL 0.5-2.5 (3) MG/3ML IN SOLN
3.0000 mL | Freq: Once | RESPIRATORY_TRACT | Status: AC
  Administered 2014-08-04: 3 mL via RESPIRATORY_TRACT
  Filled 2014-08-04: qty 3

## 2014-08-04 MED ORDER — PREDNISONE 20 MG PO TABS
60.0000 mg | ORAL_TABLET | Freq: Once | ORAL | Status: AC
  Administered 2014-08-04: 60 mg via ORAL
  Filled 2014-08-04: qty 3

## 2014-08-04 MED ORDER — ALBUTEROL SULFATE HFA 108 (90 BASE) MCG/ACT IN AERS: 2.0000 | INHALATION_SPRAY | RESPIRATORY_TRACT | Status: DC | PRN

## 2014-08-04 MED ORDER — PREDNISONE 20 MG PO TABS
40.0000 mg | ORAL_TABLET | Freq: Every day | ORAL | Status: DC
Start: 2014-08-04 — End: 2014-08-12

## 2014-08-04 MED ORDER — ALBUTEROL SULFATE HFA 108 (90 BASE) MCG/ACT IN AERS
2.0000 | INHALATION_SPRAY | RESPIRATORY_TRACT | Status: DC
  Administered 2014-08-04: 2 via RESPIRATORY_TRACT
  Filled 2014-08-04: qty 6.7

## 2014-08-04 NOTE — ED Notes (Signed)
Per [facility]; pt was at home arguing with spouse and then started having difficulty breathing, pt was yelling from ambulance to spouse as EMS was leaving. Pt stated that he woke up around 0600 and took a home breathing tx. [facility] gave pt 10 of albuterol and 0.5 of Atrovent. Wheezing noted in all fields noted by [facility]. Pt states that he is having a severe asthma attack.  Pt currently on phone holding conversation, no difficulty noted.

## 2014-08-04 NOTE — ED Provider Notes (Signed)
CSN: [id number]     Arrival date & time 08/04/14  4098 History   First MD Initiated Contact with Patient 08/04/14 0654     Chief Complaint  Patient presents with  . Shortness of Breath     (Consider location/radiation/quality/duration/timing/severity/associated sxs/prior Treatment) Patient is a 34 y.o. male presenting with shortness of breath. The history is provided by the patient. No language interpreter was used.  Shortness of Breath Severity:  Moderate Onset quality:  Sudden Duration:  1 day Timing:  Constant Progression:  Improving Chronicity:  Recurrent Context: emotional upset   Context: not activity, not known allergens, not pollens and not smoke exposure   Relieved by:  Inhaler Worsened by:  Coughing Ineffective treatments:  Inhaler Associated symptoms: cough and wheezing   Associated symptoms: no fever     Past Medical History  Diagnosis Date  . Asthma   . ADHD (attention deficit hyperactivity disorder)    Past Surgical History  Procedure Laterality Date  . Tympanoplasty     Family History  Problem Relation Age of Onset  . Heart failure Mother   . Hyperlipidemia Mother   . Hypertension Mother   . Migraines Mother   . COPD Mother   . Coronary artery disease Father   . Heart attack Father   . Heart disease Father   . Diabetes Maternal Aunt   . Asthma Maternal Grandfather   . Asthma Paternal Grandfather    History  Substance Use Topics  . Smoking status: Never Smoker   . Smokeless tobacco: Never Used  . Alcohol Use: Yes     Comment: occasionally    Review of Systems  Constitutional: Negative for fever.  Respiratory: Positive for cough, shortness of breath and wheezing.   All other systems reviewed and are negative.     Allergies  Amoxicillin-pot clavulanate and Other  Home Medications   Prior to Admission medications   Medication Sig Start Date End Date Taking? Authorizing Provider  albuterol (PROVENTIL HFA;VENTOLIN HFA) 108 (90 BASE)  MCG/ACT inhaler Inhale 1-2 puffs into the lungs every 4 (four) hours as needed for wheezing or shortness of breath. 07/09/14  Yes Marisa Severin, MD  albuterol (PROVENTIL HFA;VENTOLIN HFA) 108 (90 BASE) MCG/ACT inhaler Inhale 2 puffs into the lungs every 6 (six) hours as needed for wheezing or shortness of breath. 07/22/14  Yes Elwin Mocha, MD  albuterol (PROVENTIL) (2.5 MG/3ML) 0.083% nebulizer solution Take 3 mLs (2.5 mg total) by nebulization every 4 (four) hours as needed for wheezing or shortness of breath. 07/09/14  Yes Marisa Severin, MD  budesonide-formoterol [facility]) 160-4.5 MCG/ACT inhaler Inhale 2 puffs into the lungs 2 (two) times daily. 07/09/14  Yes Marisa Severin, MD  albuterol (PROVENTIL HFA;VENTOLIN HFA) 108 (90 BASE) MCG/ACT inhaler Inhale 2 puffs into the lungs every 4 (four) hours as needed for wheezing or shortness of breath (cough). Patient not taking: Reported on 07/09/2014 06/09/14   Elease Etienne, MD  predniSONE (DELTASONE) 20 MG tablet Take 3 tablets (60 mg total) by mouth daily. Patient not taking: Reported on 08/04/2014 07/09/14   Marisa Severin, MD  predniSONE (DELTASONE) 20 MG tablet Take 3 tablets (60 mg total) by mouth daily. Patient not taking: Reported on 08/04/2014 07/23/14   Elwin Mocha, MD   BP 125/68 mmHg  Pulse 79  Temp(Src) 98.4 F (36.9 C) (Oral)  Resp 21  SpO2 99% Physical Exam  Constitutional: He is oriented to person, place, and time. He appears well-developed and well-nourished.  HENT:  Head: Normocephalic  and atraumatic.  Eyes: Conjunctivae and EOM are normal. Pupils are equal, round, and reactive to light. Right eye exhibits no discharge. Left eye exhibits no discharge. No scleral icterus.  Neck: Normal range of motion. Neck supple. No JVD present.  Cardiovascular: Normal rate, regular rhythm and normal heart sounds.  Exam reveals no gallop and no friction rub.   No murmur heard. Pulmonary/Chest: Effort normal. No respiratory distress. He has wheezes. He has no  rales. He exhibits no tenderness.  Scattered end expiratory wheezes  Abdominal: Soft. He exhibits no distension and no mass. There is no tenderness. There is no rebound and no guarding.  Musculoskeletal: Normal range of motion. He exhibits no edema or tenderness.  Neurological: He is alert and oriented to person, place, and time.  Skin: Skin is warm and dry.  Psychiatric: He has a normal mood and affect. His behavior is normal. Judgment and thought content normal.  Nursing note and vitals reviewed.   ED Course  Procedures (including critical care time) Labs Review Labs Reviewed - No data to display  Imaging Review No results found.   EKG Interpretation None      MDM   Final diagnoses:  Asthma attack    Hx of asthma and frequent asthma exacerbations.  Has never been intubated, but has required admission in the past.  Tried inhaler with no relief.  Will give nebs, prednisone and reassess.  Patient ambulated in ED with O2 saturations maintained >90, no current signs of respiratory distress. Lung exam improved after nebulizer treatment. Prednisone given in the ED and pt will bd dc with 5 day burst. Pt states they are breathing at baseline. Pt has been instructed to continue using prescribed medications and to speak with PCP about today's exacerbation.      Roxy Horsemanobert Martita Brumm, PA-C 08/04/14 [phone]  Richardean Canalavid H Yao, MD 08/04/14 [phone]

## 2014-08-04 NOTE — ED Notes (Signed)
Pt states that he was "waking up to get my step brother ready for school and just started having an asthma attack"

## 2014-08-04 NOTE — Discharge Instructions (Signed)

## 2014-08-04 NOTE — ED Notes (Signed)
PT ambulated 100+ feet in hallway on pulse ox. PT's pulse ox stayed between 97-100% during ambulation. Pt's hr stayed between 70-85bpm. [facility] PA-[facility]. Pt returned to bed. Monitored by pulse ox, bp cuff, and 5-lead.

## 2014-08-12 ENCOUNTER — Emergency Department (HOSPITAL_COMMUNITY): Payer: Self-pay

## 2014-08-12 ENCOUNTER — Encounter (HOSPITAL_COMMUNITY): Payer: Self-pay

## 2014-08-12 ENCOUNTER — Emergency Department (HOSPITAL_COMMUNITY)
Admission: EM | Admit: 2014-08-12 | Discharge: 2014-08-12 | Disposition: A | Discharge status: Home / Self Care | Payer: Self-pay | Attending: Emergency Medicine | Admitting: Emergency Medicine

## 2014-08-12 DIAGNOSIS — Z8659 Personal history of other mental and behavioral disorders: Secondary | ICD-10-CM | POA: Insufficient documentation

## 2014-08-12 DIAGNOSIS — J45901 Unspecified asthma with (acute) exacerbation: Secondary | ICD-10-CM | POA: Insufficient documentation

## 2014-08-12 DIAGNOSIS — Z7951 Long term (current) use of inhaled steroids: Secondary | ICD-10-CM | POA: Insufficient documentation

## 2014-08-12 DIAGNOSIS — R05 Cough: Secondary | ICD-10-CM | POA: Insufficient documentation

## 2014-08-12 DIAGNOSIS — R059 Cough, unspecified: Secondary | ICD-10-CM

## 2014-08-12 DIAGNOSIS — Z880 Allergy status to penicillin: Secondary | ICD-10-CM | POA: Insufficient documentation

## 2014-08-12 MED ORDER — ALBUTEROL SULFATE HFA 108 (90 BASE) MCG/ACT IN AERS
2.0000 | INHALATION_SPRAY | Freq: Once | RESPIRATORY_TRACT | Status: AC
  Administered 2014-08-12: 2 via RESPIRATORY_TRACT
  Filled 2014-08-12: qty 6.7

## 2014-08-12 MED ORDER — ALBUTEROL (5 MG/ML) CONTINUOUS INHALATION SOLN
10.0000 mg/h | INHALATION_SOLUTION | RESPIRATORY_TRACT | Status: DC
  Administered 2014-08-12: 10 mg/h via RESPIRATORY_TRACT
  Filled 2014-08-12: qty 20

## 2014-08-12 MED ORDER — PREDNISONE 20 MG PO TABS: 60.0000 mg | ORAL_TABLET | Freq: Every day | ORAL | Status: DC

## 2014-08-12 NOTE — ED Notes (Signed)
Per EMS, EMS came to pt's house at midnight and pt received breathing treatment and he refused transport, pt tried a neb at home at 0300 then pt called ems. Pt has had 10 of albuterol and 0.5 atrovent, 125mg  of solumedrol. 133/88, HR 90, 99% on Neb, 24. Pt had wheezing in all fields, after neb pt still has wheezing in all fields but breathing has improved. Dr Littie DeedsGentry in room upon arrival. NSR on monitor.

## 2014-08-12 NOTE — Discharge Instructions (Signed)

## 2014-08-12 NOTE — ED Notes (Signed)
Pt verbalized understanding of d/c instructions and has no further questions.  

## 2014-08-12 NOTE — ED Provider Notes (Signed)
CSN: [id number]     Arrival date & time 08/12/14  0530 History   First MD Initiated Contact with Patient 08/12/14 0536     Chief Complaint  Patient presents with  . Asthma     (Consider location/radiation/quality/duration/timing/severity/associated sxs/prior Treatment) Patient is a 34 y.o. male presenting with shortness of breath.  Shortness of Breath Severity:  Severe Onset quality:  Gradual Duration:  1 day Timing:  Constant Progression:  Worsening Chronicity:  Recurrent Context: URI (son with same symptoms) and weather changes   Relieved by:  Nothing Worsened by:  Nothing tried Associated symptoms: cough   Associated symptoms: no abdominal pain and no fever     Past Medical History  Diagnosis Date  . Asthma   . ADHD (attention deficit hyperactivity disorder)    Past Surgical History  Procedure Laterality Date  . Tympanoplasty     Family History  Problem Relation Age of Onset  . Heart failure Mother   . Hyperlipidemia Mother   . Hypertension Mother   . Migraines Mother   . COPD Mother   . Coronary artery disease Father   . Heart attack Father   . Heart disease Father   . Diabetes Maternal Aunt   . Asthma Maternal Grandfather   . Asthma Paternal Grandfather    History  Substance Use Topics  . Smoking status: Never Smoker   . Smokeless tobacco: Never Used  . Alcohol Use: Yes     Comment: occasionally    Review of Systems  Constitutional: Negative for fever.  Respiratory: Positive for cough and shortness of breath.   Gastrointestinal: Negative for abdominal pain.  All other systems reviewed and are negative.     Allergies  Amoxicillin-pot clavulanate and Other  Home Medications   Prior to Admission medications   Medication Sig Start Date End Date Taking? Authorizing Provider  albuterol (PROVENTIL HFA;VENTOLIN HFA) 108 (90 BASE) MCG/ACT inhaler Inhale 2 puffs into the lungs every 4 (four) hours as needed for wheezing or shortness of breath.  08/04/14  Yes Roxy Horseman, PA-C  budesonide-formoterol (SYMBICORT) 160-4.5 MCG/ACT inhaler Inhale 2 puffs into the lungs 2 (two) times daily. 07/09/14  Yes Marisa Severin, MD  predniSONE (DELTASONE) 20 MG tablet Take 3 tablets (60 mg total) by mouth daily. 08/12/14   [name], MD   BP 133/80 mmHg  Pulse 102  Temp(Src) 98.2 F (36.8 C) (Oral)  Resp 23  Ht  (1.753 m)  Wt 189 lb (85.73 kg)  BMI 27.90 kg/m2  SpO2 94% Physical Exam  Constitutional: He is oriented to person, place, and time. He appears well-developed and well-nourished.  HENT:  Head: Normocephalic and atraumatic.  Eyes: Conjunctivae and EOM are normal.  Neck: Normal range of motion. Neck supple.  Cardiovascular: Normal rate, regular rhythm and normal heart sounds.   Pulmonary/Chest: Effort normal. No respiratory distress. He has wheezes (diffusely).  Abdominal: He exhibits no distension. There is no tenderness. There is no rebound and no guarding.  Musculoskeletal: Normal range of motion.  Neurological: He is alert and oriented to person, place, and time.  Skin: Skin is warm and dry.  Vitals reviewed.   ED Course  Procedures (including critical care time) Labs Review Labs Reviewed - No data to display  Imaging Review Dg Chest [facility] 1 View  08/12/2014   CLINICAL DATA:  Dry cough, shortness of breath.  EXAM: PORTABLE CHEST - 1 VIEW  COMPARISON:  07/22/2014, 05/24/2014  FINDINGS: Cardiomediastinal contours within normal range. Lucency artifact over  the right upper lung degrades evaluation. No confluent airspace opacity, pleural effusion, pneumothorax. No acute osseous finding.  IMPRESSION: No radiographic evidence of active cardiopulmonary disease.   Electronically Signed   By: Jearld LeschAndrew  DelGaizo M.D.   On: 08/12/2014 06:05     EKG Interpretation None      MDM   Final diagnoses:  Cough  Asthma attack    34 y.o. male with pertinent PMH of asthma presents with recurrent symptoms of asthma attack.  Received  duoneb, solumedrol PTA.  On arrival vitals and physical exam as above.  Pt wheezing.  After 10 mg continuous albuterol, symptoms relieved.  No hypoxia or increased WOB.  DC home in stable condition with prednisone.  I have reviewed all laboratory and imaging studies if ordered as above  1. Asthma attack   2. Cough         Mirian MoMatthew Ronte Parker, MD 08/12/14 [phone]

## 2014-08-16 ENCOUNTER — Emergency Department (HOSPITAL_COMMUNITY)
Admission: EM | Admit: 2014-08-16 | Discharge: 2014-08-16 | Disposition: A | Discharge status: Home / Self Care | Payer: Self-pay | Attending: Emergency Medicine | Admitting: Emergency Medicine

## 2014-08-16 ENCOUNTER — Encounter (HOSPITAL_COMMUNITY): Payer: Self-pay | Admitting: *Deleted

## 2014-08-16 DIAGNOSIS — Z7951 Long term (current) use of inhaled steroids: Secondary | ICD-10-CM | POA: Insufficient documentation

## 2014-08-16 DIAGNOSIS — K047 Periapical abscess without sinus: Secondary | ICD-10-CM | POA: Insufficient documentation

## 2014-08-16 DIAGNOSIS — J45909 Unspecified asthma, uncomplicated: Secondary | ICD-10-CM | POA: Insufficient documentation

## 2014-08-16 DIAGNOSIS — Z8659 Personal history of other mental and behavioral disorders: Secondary | ICD-10-CM | POA: Insufficient documentation

## 2014-08-16 DIAGNOSIS — Z880 Allergy status to penicillin: Secondary | ICD-10-CM | POA: Insufficient documentation

## 2014-08-16 DIAGNOSIS — Z79899 Other long term (current) drug therapy: Secondary | ICD-10-CM | POA: Insufficient documentation

## 2014-08-16 MED ORDER — HYDROCODONE-ACETAMINOPHEN 5-325 MG PO TABS
1.0000 | ORAL_TABLET | Freq: Once | ORAL | Status: AC
  Administered 2014-08-16: 1 via ORAL
  Filled 2014-08-16: qty 1

## 2014-08-16 MED ORDER — NAPROXEN 500 MG PO TABS: 500.0000 mg | ORAL_TABLET | Freq: Two times a day (BID) | ORAL | Status: DC

## 2014-08-16 MED ORDER — AMOXICILLIN 500 MG PO CAPS: 500.0000 mg | ORAL_CAPSULE | Freq: Three times a day (TID) | ORAL | Status: DC

## 2014-08-16 MED ORDER — OXYCODONE-ACETAMINOPHEN 5-325 MG PO TABS: 1.0000 | ORAL_TABLET | ORAL | Status: DC | PRN

## 2014-08-16 NOTE — ED Provider Notes (Signed)
CSN: [id number]     Arrival date & time 08/16/14  0849 History   First MD Initiated Contact with Patient 08/16/14 [phone]     Chief Complaint  Patient presents with  . Dental Pain     (Consider location/radiation/quality/duration/timing/severity/associated sxs/prior Treatment) HPI   This is a 34 year old male who presents the emergency department with chief complaint of dental pain. He is a crack in his lower right second molar. He has had 3 days of worsening severe 10 out of 10 pain with, swelling. Pain is worsened with heat, cold, palpation. Patient states that he took aspirin, Tylenol, and Motrin without any relief of his pain. He has been unable to sleep. He has had decreased oral intake because of the severity of pain in his mouth. He denies fevers, chills, difficulty swallowing.  Past Medical History  Diagnosis Date  . Asthma   . ADHD (attention deficit hyperactivity disorder)    Past Surgical History  Procedure Laterality Date  . Tympanoplasty     Family History  Problem Relation Age of Onset  . Heart failure Mother   . Hyperlipidemia Mother   . Hypertension Mother   . Migraines Mother   . COPD Mother   . Coronary artery disease Father   . Heart attack Father   . Heart disease Father   . Diabetes Maternal Aunt   . Asthma Maternal Grandfather   . Asthma Paternal Grandfather    History  Substance Use Topics  . Smoking status: Never Smoker   . Smokeless tobacco: Never Used  . Alcohol Use: Yes     Comment: occasionally    Review of Systems  Ten systems reviewed and are negative for acute change, except as noted in the HPI.    Allergies  Amoxicillin-pot clavulanate and Other  Home Medications   Prior to Admission medications   Medication Sig Start Date End Date Taking? Authorizing Provider  albuterol (PROVENTIL HFA;VENTOLIN HFA) 108 (90 BASE) MCG/ACT inhaler Inhale 2 puffs into the lungs every 4 (four) hours as needed for wheezing or shortness of breath.  08/04/14   Roxy Horseman, PA-C  amoxicillin (AMOXIL) 500 MG capsule Take 1 capsule (500 mg total) by mouth 3 (three) times daily. 08/16/14   Arthor Captain, PA-C  budesonide-formoterol (SYMBICORT) 160-4.5 MCG/ACT inhaler Inhale 2 puffs into the lungs 2 (two) times daily. 07/09/14   Marisa Severin, MD  naproxen (NAPROSYN) 500 MG tablet Take 1 tablet (500 mg total) by mouth 2 (two) times daily with a meal. 08/16/14   Arthor Captain, PA-C  oxyCODONE-acetaminophen (PERCOCET) 5-325 MG per tablet Take 1-2 tablets by mouth every 4 (four) hours as needed. 08/16/14   Arthor Captain, PA-C  predniSONE (DELTASONE) 20 MG tablet Take 3 tablets (60 mg total) by mouth daily. 08/12/14   [name], MD   BP 135/96 mmHg  Pulse 105  Temp(Src) 98.1 F (36.7 C) (Oral)  Resp 18  SpO2 100% Physical Exam Physical Exam  Nursing note and vitals reviewed. Constitutional: He appears well-developed and well-nourished. No distress.  HENT:  Mouth: 2nd molar Lower R side. Cracked. TTP. Gingival erythema. Head: Normocephalic and atraumatic.  Eyes: Conjunctivae normal are normal. No scleral icterus.  Neck: Normal range of motion. Neck supple.  Cardiovascular: Normal rate, regular rhythm and normal heart sounds.   Pulmonary/Chest: Effort normal and breath sounds normal. No respiratory distress.  Abdominal: Soft. There is no tenderness.  Musculoskeletal: He exhibits no edema.  Neurological: He is alert.  Skin: Skin is warm and dry.  He is not diaphoretic.  Psychiatric: His behavior is normal.    ED Course  Procedures (including critical care time) Labs Review Labs Reviewed - No data to display  Imaging Review No results found.   EKG Interpretation None      MDM   Final diagnoses:  Dental infection    Patient with toothache.  No gross abscess.  Exam unconcerning for Ludwig's angina or spread of infection.  Will treat with penicillin and pain medicine.  Urged patient to follow-up with dentist.        Arthor CaptainAbigail Renia Mikelson, PA-C 08/16/14 0930  Tilden FossaElizabeth Rees, MD 08/16/14 1026

## 2014-08-16 NOTE — ED Notes (Signed)
Pt reports rt lower tooth pain for 3 days. Pt states sensitivity to temperature and pressure.

## 2014-08-16 NOTE — Discharge Instructions (Signed)
[employer]  °School of [employer]  °[facility]  °[address]  °[place], [place] [address]  °Phone [phone] ° °You have been diagnosed with Dental pain. Please call the follow up dentist first thing in the morning on Monday for a follow up appointment. Keep your discharge paperwork from today's visit to bring to the dentist office. You may also use the resource guide listed below to help you find a dentist if you do not already have one to followup with. It is very important that you get evaluated by a dentist as soon as possible.  Use your pain medication as prescribed and do not operate heavy machinery while on pain medication. Note that your pain medication contains acetaminophen (Tylenol) & its is not reccommended that you use additional acetaminophen (Tylenol) while taking this medication. Take your full course of antibiotics. Read the instructions below. ° °Eat a soft or liquid diet and rinse your mouth out after meals with warm water. You should see a dentist or return here at once if you have increased swelling, increased pain or uncontrolled bleeding from the site of your injury. ° ° °SEEK MEDICAL CARE IF:  °· You have increased pain not controlled with medicines.  °· You have swelling around your tooth, in your face or neck.  °· You have bleeding which starts, continues, or gets worse.  °· You have a fever >101 °· If you are unable to open your mouth °Soft Diet  °The soft diet may be recommended after you were put on a full liquid diet. A normal diet may follow. The soft diet can also be used after surgery if you are too ill to keep down a normal diet. The soft diet may also be needed if you have a hard time chewing foods.  °DESCRIPTION  °Tender foods are used. Foods do not need to be ground or pureed. Most raw fruits and vegetables and coarse breads and cereals should be avoided. Fried foods and highly seasoned foods may  cause discomfort.  °NUTRITIONAL ADEQUACY  °A healthy diet is possible if foods from each of the basic food groups are eaten daily.  °SOFT DIET FOOD LISTS  °Milk/Dairy  °Allowed: Milk and milk drinks, milk shakes, cream cheese, cottage cheese, mild cheeses.  °Avoid: Sharp or highly seasoned cheese. °Meat/Meat Substitutes  °Allowed: Broiled, roasted, baked, or stewed tender lean beef, mutton, lamb, veal, chicken, turkey, liver, ham, crisp bacon, white fish, tuna, salmon. Eggs, smooth peanut butter.  °Avoid: All fried meats, fish, or fowl. Rich gravies and sauces. Lunch meats, sausages, hot dogs. Meats with gristle, chunky peanut butter. °Breads/Grains  °Allowed: Rice, noodles, spaghetti, macaroni. Dry or cooked refined cereals, such as farina, cream of wheat, oatmeal, grits, whole-wheat cereals. Plain or toasted white or wheat blend or whole-grain breads, soda crackers or saltines, flour tortillas.  °Avoid: Wild rice, coarse cereals, such as bran. Seed in or on breads and crackers. Bread or bread products with nuts or seeds. °Fruits/Vegetables  °Allowed: Fruit and vegetable juices, well-cooked or canned fruits and vegetables, any dried fruit. One citrus fruit daily, 1 vitamin A source daily. Well-ripened, easy to chew fruits, sweet potatoes. Baked, boiled, mashed, creamed, scalloped, or au gratin potatoes. Broths or creamed soups made with allowed vegetables, strained tomatoes.  °Avoid: All gas-forming vegetables (corn, radishes, Brussels sprouts, onions, broccoli, cabbage, parsnips, turnips, chili peppers, pinto beans, split peas, dried beans). Fruits containing seeds and skin. Potato chips and corn chips. All others   that are not made with allowed vegetables. Highly seasoned soups. °Desserts/Sweets  °Allowed: Simple desserts, such as custard, junkets, gelatin desserts, plain ice cream and sherbets, simple cakes and cookies, allowed fruits, sugar, syrup, jelly, honey, plain hard candy, and molasses.  °Avoid: Rich  pastries, any dessert containing dates, nuts, raisins, or coconut. Fried pastries, such as doughnuts. Chocolate. °Beverages  °Allowed: Fruit and vegetable juices. Caffeine-free carbonated drinks, coffee, and tea.  °Avoid: Caffeinated beverages: coffee, tea, soda or pop. °Miscellaneous  °Allowed: Butter, cream, margarine, mayonnaise, oil. Cream sauces, salt, and mild spices.  °Avoid: Highly spiced salad dressings. Highly seasoned foods, hot sauce, mustard, horseradish, and pepper. °SAMPLE MENU  °Breakfast  °Orange juice.  °Oatmeal.  °Soft cooked egg.  °Toast and margarine.  °2% milk.  °Coffee. °Lunch  °Meatloaf.  °Mashed potato.  °Green beans.  °Lemon pudding.  °Bread and margarine.  °Coffee. °Dinner  °Consommé or apricot nectar.  °Chicken breast.  °Rice, peas, and carrots.  °Applesauce.  °Bread and margarine.  °2% milk. °To cut the amount of fat in your diet, omit margarine and use 1% or skim milk.  °NUTRIENT ANALYSIS  °Calories........................1953 Kcal.  °Protein.........................102 gm.  °Carbohydrate...............247 gm.  °Fat................................65 gm.  °Cholesterol...................449 mg.  °Dietary fiber.................19 gm.  °Vitamin A.....................2944 RE.  °Vitamin C.....................79 mg.  °Niacin..........................25 mg.  °Riboflavin....................2.0 mg.  °Thiamin.......................1.5 mg.  °Folate..........................249 mcg.  °Calcium.......................1030 mg.  °Phosphorus.................1782 mg.  °Zinc..............................12 mg.  °Iron..............................13 mg.  °Sodium.........................299 mg.  °Potassium....................3046 mg. °Document Released: 07/16/2007 Document Revised: 07/01/2011 Document Reviewed: 07/16/2007  °ExitCare® Patient Information ©2014 [employer].  ° °RESOURCE GUIDE ° ° °Dental Problems ° °Dr. Janna Civilis °$200 dollar visit °[address] °[place], [place] [address]    °[phone] °  ° °Patients with Medicaid: °[facility]                     [facility] °[address]                                           [address] °Phone:  [phone]                                                  Phone:  [phone] ° °If unable to pay or uninsured, contact:  [facility] or [facility] to become qualified for the adult dental clinic. ° °Chronic Pain Problems °Contact [facility]  [phone] °Patients need to be referred by their primary care doctor. ° °Insufficient Money for Medicine °Contact [employer]:  call "211" or [facility] [phone]. ° °No Primary Care Doctor °Call [facility]  [phone] °Other agencies that provide inexpensive medical care °   [facility]  [phone] °   [facility]  [phone] °   [facility]  [phone] °   [facility]  [phone] °   [facility]  [phone] °   [facility]  [phone] ° °Psychological Services °[facility]  [phone] °[facility]  [phone] °[facility]   [phone] (emergency services [phone]) ° °Substance Abuse Resources °[facility]  [phone] °[facility] [phone] °[facility] [phone] °[facility] [phone] °  Residential & Outpatient Substance Abuse Program  800-659-3381 ° °Abuse/Neglect °[place] [facility] (336) 641-3795 °[place] [facility] Hotline 800-378-5315 (After Hours) ° °Emergency Shelter ° [facility] (336) [phone] ° °Maternity Homes °[facility] (336) 275-9566 °[facility] (704) 372-4663 ° °MRSA Hotline #:   832-7006 ° ° ° °[place] [facility] ° °[facility]     [employer]                          [facility] °[address] [place]                       [address]      [address]  °[place]                                                [place]                            [place] °Phone:  [phone]                                   Phone:  [phone]                 Phone:  [phone] ° °[facility] °Phone:  342-8316 ° °[place] [facility] Hotline °(336) 342-1394 °(336) 342-3537 (After Hours) ° ° ° ° ° °\ °

## 2015-07-16 DIAGNOSIS — T391X1A Poisoning by 4-Aminophenol derivatives, accidental (unintentional), initial encounter: Principal | ICD-10-CM | POA: Diagnosis present

## 2015-07-16 DIAGNOSIS — K0889 Other specified disorders of teeth and supporting structures: Secondary | ICD-10-CM | POA: Diagnosis present

## 2015-07-16 DIAGNOSIS — F909 Attention-deficit hyperactivity disorder, unspecified type: Secondary | ICD-10-CM | POA: Diagnosis present

## 2015-07-16 DIAGNOSIS — R740 Nonspecific elevation of levels of transaminase and lactic acid dehydrogenase [LDH]: Secondary | ICD-10-CM | POA: Diagnosis present

## 2015-07-16 DIAGNOSIS — Z888 Allergy status to other drugs, medicaments and biological substances status: Secondary | ICD-10-CM

## 2015-07-16 DIAGNOSIS — Z881 Allergy status to other antibiotic agents status: Secondary | ICD-10-CM

## 2015-07-16 DIAGNOSIS — Z79899 Other long term (current) drug therapy: Secondary | ICD-10-CM

## 2015-07-16 DIAGNOSIS — X58XXXA Exposure to other specified factors, initial encounter: Secondary | ICD-10-CM | POA: Diagnosis present

## 2015-07-17 ENCOUNTER — Inpatient Hospital Stay (HOSPITAL_COMMUNITY)
Admission: EM | Admit: 2015-07-17 | Discharge: 2015-07-20 | DRG: 918 | Disposition: A | Discharge status: Home Health | Payer: Self-pay | Attending: Internal Medicine | Admitting: Internal Medicine

## 2015-07-17 ENCOUNTER — Emergency Department (HOSPITAL_COMMUNITY): Payer: Self-pay

## 2015-07-17 ENCOUNTER — Encounter (HOSPITAL_COMMUNITY): Payer: Self-pay | Admitting: Emergency Medicine

## 2015-07-17 DIAGNOSIS — R740 Nonspecific elevation of levels of transaminase and lactic acid dehydrogenase [LDH]: Secondary | ICD-10-CM

## 2015-07-17 DIAGNOSIS — K0889 Other specified disorders of teeth and supporting structures: Secondary | ICD-10-CM

## 2015-07-17 DIAGNOSIS — T391X1A Poisoning by 4-Aminophenol derivatives, accidental (unintentional), initial encounter: Secondary | ICD-10-CM

## 2015-07-17 DIAGNOSIS — R7401 Elevation of levels of liver transaminase levels: Secondary | ICD-10-CM | POA: Diagnosis present

## 2015-07-17 HISTORY — DX: Nonspecific elevation of levels of transaminase and lactic acid dehydrogenase (ldh): R74.0

## 2015-07-17 HISTORY — DX: Elevation of levels of liver transaminase levels: R74.01

## 2015-07-17 LAB — COMPREHENSIVE METABOLIC PANEL
ALK PHOS: 76 U/L (ref 38–126)
ALT: 1982 U/L — AB (ref 17–63)
AST: 1128 U/L — ABNORMAL HIGH (ref 15–41)
Albumin: 3.4 g/dL — ABNORMAL LOW (ref 3.5–5.0)
Anion gap: 12 (ref 5–15)
BUN: 5 mg/dL — ABNORMAL LOW (ref 6–20)
CO2: 22 mmol/L (ref 22–32)
Calcium: 9 mg/dL (ref 8.9–10.3)
Chloride: 103 mmol/L (ref 101–111)
Creatinine, Ser: 0.9 mg/dL (ref 0.61–1.24)
GFR calc non Af Amer: >60 mL/min (ref >60)
Glucose, Bld: 103 mg/dL — ABNORMAL HIGH (ref 65–99)
Potassium: 3.5 mmol/L (ref 3.5–5.1)
Sodium: 137 mmol/L (ref 135–145)
TOTAL PROTEIN: 6.1 g/dL — AB (ref 6.5–8.1)
Total Bilirubin: 1.2 mg/dL (ref 0.3–1.2)

## 2015-07-17 LAB — CBC
HCT: 43.6 % (ref 39.0–52.0)
Hemoglobin: 15.2 g/dL (ref 13.0–17.0)
MCH: 30.7 pg (ref 26.0–34.0)
MCHC: 34.9 g/dL (ref 30.0–36.0)
MCV: 88.1 fL (ref 78.0–100.0)
PLATELETS: 269 10*3/uL (ref 150–400)
RBC: 4.95 MIL/uL (ref 4.22–5.81)
RDW: 13.4 % (ref 11.5–15.5)
WBC: 10 10*3/uL (ref 4.0–10.5)

## 2015-07-17 LAB — PROTIME-INR
INR: 1.21 (ref 0.00–1.49)
Prothrombin Time: 15.4 seconds — ABNORMAL HIGH (ref 11.6–15.2)

## 2015-07-17 LAB — URINALYSIS, ROUTINE W REFLEX MICROSCOPIC
Glucose, UA: NEGATIVE mg/dL
Hgb urine dipstick: NEGATIVE
Ketones, ur: 40 mg/dL — AB
Leukocytes, UA: NEGATIVE
NITRITE: NEGATIVE
PH: 7.5 (ref 5.0–8.0)
Protein, ur: NEGATIVE mg/dL
Specific Gravity, Urine: 1.011 (ref 1.005–1.030)

## 2015-07-17 LAB — INFLUENZA PANEL BY PCR (TYPE A & B)
H1N1 flu by pcr: NOT DETECTED
INFLAPCR: NEGATIVE
INFLBPCR: NEGATIVE

## 2015-07-17 LAB — ACETAMINOPHEN LEVEL: Acetaminophen (Tylenol), Serum: <10 ug/mL — ABNORMAL LOW (ref 10–30)

## 2015-07-17 LAB — LIPASE, BLOOD: Lipase: 21 U/L (ref 11–51)

## 2015-07-17 LAB — CK: Total CK: 78 U/L (ref 49–397)

## 2015-07-17 MED ORDER — ONDANSETRON HCL 4 MG/2ML IJ SOLN
4.0000 mg | Freq: Once | INTRAMUSCULAR | Status: AC
  Administered 2015-07-17: 4 mg via INTRAVENOUS
  Filled 2015-07-17: qty 2

## 2015-07-17 MED ORDER — MORPHINE SULFATE (PF) 4 MG/ML IV SOLN
6.0000 mg | Freq: Once | INTRAVENOUS | Status: AC
  Administered 2015-07-17: 6 mg via INTRAVENOUS
  Filled 2015-07-17: qty 2

## 2015-07-17 MED ORDER — INFLUENZA VAC SPLIT QUAD 0.5 ML IM SUSY
0.5000 mL | PREFILLED_SYRINGE | INTRAMUSCULAR | Status: AC
  Administered 2015-07-18: 0.5 mL via INTRAMUSCULAR
  Filled 2015-07-17: qty 0.5

## 2015-07-17 MED ORDER — ACETYLCYSTEINE LOAD VIA INFUSION: 150.0000 mg/kg | Freq: Once | INTRAVENOUS | Status: DC

## 2015-07-17 MED ORDER — KETOROLAC TROMETHAMINE 60 MG/2ML IM SOLN: 60.0000 mg | Freq: Once | INTRAMUSCULAR | Status: DC

## 2015-07-17 MED ORDER — BENZOCAINE 10 % MT GEL: Freq: Four times a day (QID) | OROMUCOSAL | Status: DC | PRN

## 2015-07-17 MED ORDER — DEXTROSE 5 % IV SOLN
15.0000 mg/kg/h | INTRAVENOUS | Status: DC
  Administered 2015-07-17: 15 mg/kg/h via INTRAVENOUS
  Filled 2015-07-17 (×3): qty 200

## 2015-07-17 MED ORDER — MORPHINE SULFATE (PF) 2 MG/ML IV SOLN
2.0000 mg | INTRAVENOUS | Status: DC | PRN
  Administered 2015-07-17 – 2015-07-20 (×11): 4 mg via INTRAVENOUS
  Filled 2015-07-17 (×11): qty 2

## 2015-07-17 MED ORDER — ALBUTEROL SULFATE (2.5 MG/3ML) 0.083% IN NEBU: 2.5000 mg | INHALATION_SOLUTION | Freq: Four times a day (QID) | RESPIRATORY_TRACT | Status: DC | PRN

## 2015-07-17 MED ORDER — IPRATROPIUM-ALBUTEROL 0.5-2.5 (3) MG/3ML IN SOLN
6.0000 mL | RESPIRATORY_TRACT | Status: DC
  Administered 2015-07-17 (×2): 6 mL via RESPIRATORY_TRACT
  Filled 2015-07-17: qty 3
  Filled 2015-07-17: qty 9
  Filled 2015-07-17: qty 3

## 2015-07-17 MED ORDER — ONDANSETRON HCL 4 MG/2ML IJ SOLN
4.0000 mg | Freq: Four times a day (QID) | INTRAMUSCULAR | Status: DC | PRN
  Administered 2015-07-17: 4 mg via INTRAVENOUS
  Filled 2015-07-17: qty 2

## 2015-07-17 MED ORDER — ALBUTEROL SULFATE (2.5 MG/3ML) 0.083% IN NEBU
2.5000 mg | INHALATION_SOLUTION | RESPIRATORY_TRACT | Status: DC | PRN
  Administered 2015-07-17 – 2015-07-20 (×13): 2.5 mg via RESPIRATORY_TRACT
  Filled 2015-07-17 (×13): qty 3

## 2015-07-17 MED ORDER — SODIUM CHLORIDE 0.9 % IV BOLUS (SEPSIS)
1000.0000 mL | Freq: Once | INTRAVENOUS | Status: AC
  Administered 2015-07-17: 1000 mL via INTRAVENOUS

## 2015-07-17 MED ORDER — ACETYLCYSTEINE LOAD VIA INFUSION
150.0000 mg/kg | Freq: Once | INTRAVENOUS | Status: AC
  Administered 2015-07-17: 13125 mg via INTRAVENOUS
  Filled 2015-07-17: qty 329

## 2015-07-17 MED ORDER — PROMETHAZINE HCL 25 MG/ML IJ SOLN
25.0000 mg | Freq: Once | INTRAMUSCULAR | Status: AC
  Administered 2015-07-17: 25 mg via INTRAVENOUS
  Filled 2015-07-17: qty 1

## 2015-07-17 MED ORDER — SODIUM CHLORIDE 0.9 % IV SOLN
INTRAVENOUS | Status: DC
  Administered 2015-07-17: 06:00:00 via INTRAVENOUS

## 2015-07-17 NOTE — ED Provider Notes (Signed)
CSN: [id number]     Arrival date & time 07/16/15  2335 History  By signing my name below, I, Bethel Born, attest that this documentation has been prepared under the direction and in the presence of Tomasita Crumble, MD. Electronically Signed: Bethel Born, ED Scribe. 07/17/2015. 2:36 AM   Chief Complaint  Patient presents with  . Emesis  . Generalized Body Aches  . Cough    The history is provided by the patient. No language interpreter was used.   Devin [name] is a 35 y.o. male who presents to the Emergency Department complaining of nausea and vomiting with onset 3 days ago. Pt states that he is unable to keep anything down and today his vomitus started to look blood tinged.  Associated symptoms include chills, abdominal pain, myalgias, cough, SOB. Tylenol has provided insufficient relief in symptoms at home. Pt denies fever and diarrhea. No known sick contact. He did not have a flu shot this year.   Past Medical History  Diagnosis Date  . Asthma   . ADHD (attention deficit hyperactivity disorder)    Past Surgical History  Procedure Laterality Date  . Tympanoplasty     Family History  Problem Relation Age of Onset  . Heart failure Mother   . Hyperlipidemia Mother   . Hypertension Mother   . Migraines Mother   . COPD Mother   . Coronary artery disease Father   . Heart attack Father   . Heart disease Father   . Diabetes Maternal Aunt   . Asthma Maternal Grandfather   . Asthma Paternal Grandfather    Social History  Substance Use Topics  . Smoking status: Never Smoker   . Smokeless tobacco: Never Used  . Alcohol Use: Yes     Comment: occasionally    Review of Systems 10 Systems reviewed and all are negative for acute change except as noted in the HPI.    Allergies  Amoxicillin-pot clavulanate and Other  Home Medications   Prior to Admission medications   Medication Sig Start Date End Date Taking? Authorizing Provider  acetaminophen (TYLENOL) 325 MG tablet  Take 650 mg by mouth every 6 (six) hours as needed for fever.   Yes Historical Provider, MD  albuterol (PROVENTIL) (2.5 MG/3ML) 0.083% nebulizer solution Take 2.5 mg by nebulization every 6 (six) hours as needed for wheezing or shortness of breath.   Yes Historical Provider, MD  albuterol (PROVENTIL HFA;VENTOLIN HFA) 108 (90 BASE) MCG/ACT inhaler Inhale 2 puffs into the lungs every 4 (four) hours as needed for wheezing or shortness of breath. Patient not taking: Reported on 07/17/2015 08/04/14   Roxy Horseman, PA-C  amoxicillin (AMOXIL) 500 MG capsule Take 1 capsule (500 mg total) by mouth 3 (three) times daily. Patient not taking: Reported on 07/17/2015 08/16/14   Arthor Captain, PA-C  budesonide-formoterol [facility]) 160-4.5 MCG/ACT inhaler Inhale 2 puffs into the lungs 2 (two) times daily. Patient not taking: Reported on 07/17/2015 07/09/14   Marisa Severin, MD  naproxen (NAPROSYN) 500 MG tablet Take 1 tablet (500 mg total) by mouth 2 (two) times daily with a meal. Patient not taking: Reported on 07/17/2015 08/16/14   Arthor Captain, PA-C  oxyCODONE-acetaminophen (PERCOCET) 5-325 MG per tablet Take 1-2 tablets by mouth every 4 (four) hours as needed. Patient not taking: Reported on 07/17/2015 08/16/14   Arthor Captain, PA-C  predniSONE (DELTASONE) 20 MG tablet Take 3 tablets (60 mg total) by mouth daily. Patient not taking: Reported on 07/17/2015 08/12/14   [name], MD  BP 108/86 mmHg  Pulse 107  Temp(Src) 98.6 F (37 C) (Oral)  Resp 20  SpO2 96% Physical Exam  Constitutional: He is oriented to person, place, and time. Vital signs are normal. He appears well-developed and well-nourished.  Non-toxic appearance. He does not appear ill. No distress.  HENT:  Head: Normocephalic and atraumatic.  Nose: Nose normal.  Mouth/Throat: Oropharynx is clear and moist. No oropharyngeal exudate.  Eyes: Conjunctivae and EOM are normal. Pupils are equal, round, and reactive to light. No scleral icterus.      Neck: Normal range of motion. Neck supple. No tracheal deviation, no edema, no erythema and normal range of motion present. No thyroid mass and no thyromegaly present.  Cardiovascular: Normal rate, regular rhythm, S1 normal, S2 normal, normal heart sounds, intact distal pulses and normal pulses.  Exam reveals no gallop and no friction rub.   No murmur heard. Pulmonary/Chest: Effort normal. No respiratory distress. He has wheezes. He has no rhonchi. He has no rales.  Mild expiratory wheezing bilaterally   Abdominal: Soft. Normal appearance and bowel sounds are normal. He exhibits no distension, no ascites and no mass. There is no hepatosplenomegaly. There is no tenderness. There is no rebound, no guarding and no CVA tenderness.  Musculoskeletal: Normal range of motion. He exhibits no edema or tenderness.  Lymphadenopathy:    He has no cervical adenopathy.  Neurological: He is alert and oriented to person, place, and time. He has normal strength. No cranial nerve deficit or sensory deficit.  Skin: Skin is warm, dry and intact. No petechiae and no rash noted. He is not diaphoretic. No erythema. No pallor.  Tactile fever  Psychiatric: He has a normal mood and affect. His behavior is normal. Judgment normal.  Nursing note and vitals reviewed.   ED Course  Procedures (including critical care time) DIAGNOSTIC STUDIES: Oxygen Saturation is 96% on RA,  normal by my interpretation.    COORDINATION OF CARE: 2:32 AM Discussed treatment plan which includes lab work, CXR, Toradol, Zofran, and a breathing treatment with pt at bedside and pt agreed to plan.  Labs Review Labs Reviewed  COMPREHENSIVE METABOLIC PANEL - Abnormal; Notable for the following:    Glucose, Bld 103 (*)    BUN 5 (*)    Total Protein 6.1 (*)    Albumin 3.4 (*)    AST 1128 (*)    ALT 1982 (*)    All other components within normal limits  URINALYSIS, ROUTINE W REFLEX MICROSCOPIC (NOT AT [facility]) - Abnormal; Notable for the  following:    Bilirubin Urine SMALL (*)    Ketones, ur 40 (*)    All other components within normal limits  ACETAMINOPHEN LEVEL - Abnormal; Notable for the following:    Acetaminophen (Tylenol), Serum <10 (*)    All other components within normal limits  LIPASE, BLOOD  CBC  PROTIME-INR  HEPATITIS PANEL, ACUTE  EPSTEIN-BARR VIRUS VCA, IGG  EPSTEIN-BARR VIRUS VCA, IGM  RSV(RESPIRATORY SYNCYTIAL VIRUS) AB, BLOOD  CMV IGM  INFLUENZA PANEL BY PCR (TYPE A & B, H1N1)    Imaging Review Dg Chest 2 View  07/17/2015  CLINICAL DATA:  Acute onset of cough and dizziness. Initial encounter. EXAM: CHEST  2 VIEW COMPARISON:  Chest radiograph performed 08/12/2014 FINDINGS: The lungs are well-aerated and clear. There is no evidence of focal opacification, pleural effusion or pneumothorax. The heart is normal in size; the mediastinal contour is within normal limits. No acute osseous abnormalities are seen. IMPRESSION: No acute  cardiopulmonary process seen. Electronically Signed   By: Roanna RaiderJeffery  Chang M.D.   On: 07/17/2015 00:44   [place] Abdomen Limited Ruq  07/17/2015  CLINICAL DATA:  35 year old male with right upper quadrant abdominal pain. Elevated liver enzymes. EXAM: US ABDOMEN LIMITED - RIGHT UPPER QUADRANT COMPARISON:  None. FINDINGS: Gallbladder: No gallstones or wall thickening visualized. No sonographic Murphy sign noted by sonographer. Common bile duct: Diameter: 2 mm Liver: No focal lesion identified. Within normal limits in parenchymal echogenicity. IMPRESSION: Unremarkable right upper quadrant ultrasound. Electronically Signed   By: Elgie CollardArash  Radparvar M.D.   On: 07/17/2015 03:19   I have personally reviewed and evaluated these images and lab results as part of my medical decision-making.   EKG Interpretation None      MDM   Final diagnoses:  Transaminitis    Patient presents to the ED for flu like symptoms.  He also has diffuse abdominal pain.  Labs show a transaminitis.  He states he took 8g  of tylenol 2 days ago for his body aches and dental pain.  This is likely due to tylenol overdose.  His level here is negative, he could also be having a viral hepatitis.  NAC was started.  Patient admitted to Dr. Julian ReilGardner for continued care.   CRITICAL CARE Performed by: Tomasita CrumbleNI,Tenicia Gural   Total critical care time: 40 minutes acute tylenol OD with transminitis  Critical care time was exclusive of separately billable procedures and treating other patients.  Critical care was necessary to treat or prevent imminent or life-threatening deterioration.  Critical care was time spent personally by me on the following activities: development of treatment plan with patient and/or surrogate as well as nursing, discussions with consultants, evaluation of patient's response to treatment, examination of patient, obtaining history from patient or surrogate, ordering and performing treatments and interventions, ordering and review of laboratory studies, ordering and review of radiographic studies, pulse oximetry and re-evaluation of patient's condition.    I personally performed the services described in this documentation, which was scribed in my presence. The recorded information has been reviewed and is accurate.      Tomasita CrumbleAdeleke Keron Koffman, MD 07/17/15 [phone]

## 2015-07-17 NOTE — H&P (Signed)
[facility] History and Physical  Devin [name] Bonillas UJW:[id number] DOB: 12/15/1980 DOA: 07/17/2015  Referring physician: EDP PCP: Doris CheadleADVANI, DEEPAK, MD   Chief Complaint: Emesis   HPI: Devin [name] Bartolotta is a 35 y.o. male who presents to the ED with c/o nausea, vomiting, and epigastric pain.  3 days ago he began to have cough, congestion, and a tooth ache.  2 days ago he took 8 grams of tylenol over the course of a day for the tooth ache.  Unfortunately that evening he developed abdominal pain, nausea, vomiting.  He did not take any more tylenol but his symptoms have persisted.  He denies fever or diarrhea.  No known sick contacts.  No flu shot this year.  He presents to the ED.  Review of Systems: Systems reviewed.  As above, otherwise negative  Past Medical History  Diagnosis Date  . Asthma   . ADHD (attention deficit hyperactivity disorder)    Past Surgical History  Procedure Laterality Date  . Tympanoplasty     Social History:  reports that he has never smoked. He has never used smokeless tobacco. He reports that he drinks alcohol. He reports that he does not use illicit drugs.  Allergies  Allergen Reactions  . Amoxicillin-Pot Clavulanate Hives    Tolerates Amoxicillin - Thuy 09/29/12  . Other Nausea And Vomiting    coconut    Family History  Problem Relation Age of Onset  . Heart failure Mother   . Hyperlipidemia Mother   . Hypertension Mother   . Migraines Mother   . COPD Mother   . Coronary artery disease Father   . Heart attack Father   . Heart disease Father   . Diabetes Maternal Aunt   . Asthma Maternal Grandfather   . Asthma Paternal Grandfather      Prior to Admission medications   Medication Sig Start Date End Date Taking? Authorizing Provider  albuterol (PROVENTIL) (2.5 MG/3ML) 0.083% nebulizer solution Take 2.5 mg by nebulization every 6 (six) hours as needed for wheezing or shortness of breath.   Yes Historical Provider, MD   Physical Exam: Filed Vitals:    07/17/15 0430 07/17/15 0500  BP: 116/75 127/87  Pulse: 83 84  Temp:    Resp:  16    BP 127/87 mmHg  Pulse 84  Temp(Src) 98.6 F (37 C) (Oral)  Resp 16  Ht 5\' 9"  (1.753 m)  Wt 87.544 kg (193 lb)  BMI 28.49 kg/m2  SpO2 98%  General Appearance:    Alert, oriented, no distress, appears stated age  Head:    Normocephalic, atraumatic  Eyes:    PERRL, EOMI, sclera non-icteric        Nose:   Nares without drainage or epistaxis. Mucosa, turbinates normal  Throat:   Moist mucous membranes. Oropharynx without erythema or exudate.  Neck:   Supple. No carotid bruits.  No thyromegaly.  No lymphadenopathy.   Back:     No CVA tenderness, no spinal tenderness  Lungs:     Clear to auscultation bilaterally, without wheezes, rhonchi or rales  Chest wall:    No tenderness to palpitation  Heart:    Regular rate and rhythm without murmurs, gallops, rubs  Abdomen:     Soft, non-tender, nondistended, normal bowel sounds, no organomegaly  Genitalia:    deferred  Rectal:    deferred  Extremities:   No clubbing, cyanosis or edema.  Pulses:   2+ and symmetric all extremities  Skin:   Skin color, texture, turgor normal,  no rashes or lesions  Lymph nodes:   Cervical, supraclavicular, and axillary nodes normal  Neurologic:   CNII-XII intact. Normal strength, sensation and reflexes      throughout    Labs on Admission:  Basic Metabolic Panel:  Recent Labs Lab 07/17/15 0014  NA 137  K 3.5  CL 103  CO2 22  GLUCOSE 103*  BUN 5*  CREATININE 0.90  CALCIUM 9.0   Liver Function Tests:  Recent Labs Lab 07/17/15 0014  AST 1128*  ALT 1982*  ALKPHOS 76  BILITOT 1.2  PROT 6.1*  ALBUMIN 3.4*    Recent Labs Lab 07/17/15 0014  LIPASE 21   No results for input(s): AMMONIA in the last 168 hours. CBC:  Recent Labs Lab 07/17/15 0014  WBC 10.0  HGB 15.2  HCT 43.6  MCV 88.1  PLT 269   Cardiac Enzymes: No results for input(s): CKTOTAL, CKMB, CKMBINDEX, TROPONINI in the last 168  hours.  BNP (last 3 results) No results for input(s): PROBNP in the last 8760 hours. CBG: No results for input(s): GLUCAP in the last 168 hours.  Radiological Exams on Admission: Dg Chest 2 View  07/17/2015  CLINICAL DATA:  Acute onset of cough and dizziness. Initial encounter. EXAM: CHEST  2 VIEW COMPARISON:  Chest radiograph performed 08/12/2014 FINDINGS: The lungs are well-aerated and clear. There is no evidence of focal opacification, pleural effusion or pneumothorax. The heart is normal in size; the mediastinal contour is within normal limits. No acute osseous abnormalities are seen. IMPRESSION: No acute cardiopulmonary process seen. Electronically Signed   By: Roanna Raider M.D.   On: 07/17/2015 00:44   US Abdomen Limited Ruq  07/17/2015  CLINICAL DATA:  35 year old male with right upper quadrant abdominal pain. Elevated liver enzymes. EXAM: US ABDOMEN LIMITED - RIGHT UPPER QUADRANT COMPARISON:  None. FINDINGS: Gallbladder: No gallstones or wall thickening visualized. No sonographic Murphy sign noted by sonographer. Common bile duct: Diameter: 2 mm Liver: No focal lesion identified. Within normal limits in parenchymal echogenicity. IMPRESSION: Unremarkable right upper quadrant ultrasound. Electronically Signed   By: Elgie Collard M.D.   On: 07/17/2015 03:19    EKG: Independently reviewed.  Assessment/Plan Principal Problem:   Transaminitis Active Problems:   Tooth ache   1. Transaminitis - accidental tylenol overdose vs viral 1. Repeat CMP in AM tomorrow 2. Acute hepatitis pnl, EBV, CMV, RSV have been ordered 1. Also have ordered flu pnl due to possible ILI symptoms (though it seems unlikely that influenza is causing the transaminitis itself). 3. Put on acetylcystine per pharm consult at poison control recs. 1. Tylenol level is currently negative 4. Likely warrants GI consult after labs back to see if they have any recommendations to offer. 5. IVF 6. Symptom control with  morphine / zofran PRN 2. Tooth ache - 1. PRN benzocain    Code Status: Full  Family Communication: No family in room Disposition Plan: Admit to inpatient   Time spent: 70 min  Math Brazie M. [facility] Pager [phone]  If 7AM-7PM, please contact the day team taking care of the patient [email or web address] Password [facility] 07/17/2015, 5:35 AM

## 2015-07-17 NOTE — Progress Notes (Signed)
[facility] PROGRESS NOTE  Devin [name] VWU:[id number] DOB: 10-05-1980 DOA: 07/17/2015 PCP: Doris Cheadle, MD  Assessment/Plan: 35 y/o male with PMH of Asthma presented with nausea, vomiting, myalgias. He recently took some tylenol for recent URI, congestion   Elevated LFTs, probable tylenol toxicity. [id number]. [id number].  INR 1.2. Lipase-21. Bili-1.2. Tylenol level <10 on admission. RUQ [place]: unremarkable.  -he reports feeling better. we will cont IV acetylcysteine. Check CK. Pend Acute hepatitis pnl, EBV, CMV, RSV. Cont IVF, supportive care   Asthma. Clinically stable. Cont prn albuterol   Code Status: full Family Communication: d/w patient, Charity fundraiser (indicate person spoken with, relationship, and if by phone, the number) Disposition Plan: home soon    Consultants:  none  Procedures:  none  Antibiotics:  none (indicate start date, and stop date if known)  HPI/Subjective: Alert, no distress   Objective: Filed Vitals:   07/17/15 0700 07/17/15 0747  BP: 126/89 123/95  Pulse: 108 101  Temp:  97.9 F (36.6 C)  Resp: 16 17    Intake/Output Summary (Last 24 hours) at 07/17/15 1107 Last data filed at 07/17/15 0958  Gross per 24 hour  Intake   1000 ml  Output    600 ml  Net    400 ml   Filed Weights   07/17/15 0400  Weight: 87.544 kg (193 lb)    Exam:   General:  Comfortable   Cardiovascular: s1,s2 rrr  Respiratory: no wheezing   Abdomen: soft, nt, nd   Musculoskeletal: no leg edema    Data Reviewed: Basic Metabolic Panel:  Recent Labs Lab 07/17/15 0014  NA 137  K 3.5  CL 103  CO2 22  GLUCOSE 103*  BUN 5*  CREATININE 0.90  CALCIUM 9.0   Liver Function Tests:  Recent Labs Lab 07/17/15 0014  AST 1128*  ALT 1982*  ALKPHOS 76  BILITOT 1.2  PROT 6.1*  ALBUMIN 3.4*    Recent Labs Lab 07/17/15 0014  LIPASE 21   No results for input(s): AMMONIA in the last 168 hours. CBC:  Recent Labs Lab 07/17/15 0014  WBC 10.0  HGB 15.2  HCT  43.6  MCV 88.1  PLT 269   Cardiac Enzymes: No results for input(s): CKTOTAL, CKMB, CKMBINDEX, TROPONINI in the last 168 hours. BNP (last 3 results) No results for input(s): BNP in the last 8760 hours.  ProBNP (last 3 results) No results for input(s): PROBNP in the last 8760 hours.  CBG: No results for input(s): GLUCAP in the last 168 hours.  No results found for this or any previous visit (from the past 240 hour(s)).   Studies: Dg Chest 2 View  07/17/2015  CLINICAL DATA:  Acute onset of cough and dizziness. Initial encounter. EXAM: CHEST  2 VIEW COMPARISON:  Chest radiograph performed 08/12/2014 FINDINGS: The lungs are well-aerated and clear. There is no evidence of focal opacification, pleural effusion or pneumothorax. The heart is normal in size; the mediastinal contour is within normal limits. No acute osseous abnormalities are seen. IMPRESSION: No acute cardiopulmonary process seen. Electronically Signed   By: Roanna Raider M.D.   On: 07/17/2015 00:44   US Abdomen Limited Ruq  07/17/2015  CLINICAL DATA:  35 year old male with right upper quadrant abdominal pain. Elevated liver enzymes. EXAM: US ABDOMEN LIMITED - RIGHT UPPER QUADRANT COMPARISON:  None. FINDINGS: Gallbladder: No gallstones or wall thickening visualized. No sonographic Murphy sign noted by sonographer. Common bile duct: Diameter: 2 mm Liver: No focal lesion identified. Within normal limits in parenchymal  echogenicity. IMPRESSION: Unremarkable right upper quadrant ultrasound. Electronically Signed   By: Elgie CollardArash  Radparvar M.D.   On: 07/17/2015 03:19    Scheduled Meds:  Continuous Infusions: . sodium chloride 125 mL/hr at 07/17/15 0550  . acetylcysteine 15 mg/kg/hr (07/17/15 0444)    Principal Problem:   Transaminitis Active Problems:   Tooth ache    Time spent: >35 minutes     Esperanza SheetsBURIEV, Kennet Mccort N  [facility] Pager [phone]. If 7PM-7AM, please contact night-coverage at [email or web address], password  [email or web address] 07/17/2015, 11:07 AM  LOS: 0 days

## 2015-07-17 NOTE — ED Notes (Signed)
Poison control contacted by Vernard GamblesVeronda Bryk

## 2015-07-17 NOTE — ED Notes (Signed)
Pt still throwing up, called Julian ReilGardner MD to inform, verbal order for 25 Phenergan

## 2015-07-17 NOTE — ED Notes (Signed)
C/o nausea, vomiting, generalized body aches, and productive cough with yellowish- brown sputum x 3 days.  Denies fever at home.

## 2015-07-17 NOTE — Progress Notes (Signed)
RT assessment done at this time, and nebs changed to PRN. Patient stated he was not SOB, and agreed with this plan. BBS clear. Chest Xray clear. RT to monitor as needed.

## 2015-07-17 NOTE — ED Notes (Signed)
Pt still throwing up, (10th time)

## 2015-07-17 NOTE — Progress Notes (Addendum)
35yo male c/o N/V, chills, abdominal pain, myalgias, cough, and SOB x3d.  Pt states he took ~8g of APAP over the past 24hr.  LFT elevated w/ AST 1128, ALT 1982.  [facility] who recommends acetylcysteine 150 mg/kg load over 1hr followed by 15 mg/kg/hr over next 22hr.  Labs recommended at 22hr after NAC administration begins; will need LFT and PT/INR; current APAP level not yet available but if initial level is elevated will need another level at 22hr.  Initial LOT recommended as 24hr dependent on f/u labs.  Will need to f/u w/ poison center.   Vernard GamblesVeronda Piedad Standiford, PharmD, BCPS 07/17/2015 3:38 AM    ADDENDUM: APAP level <10, per poison center no need to get repeat level. VB 4:54 AM

## 2015-07-18 LAB — COMPREHENSIVE METABOLIC PANEL
ALT: 948 U/L — ABNORMAL HIGH (ref 17–63)
AST: 239 U/L — ABNORMAL HIGH (ref 15–41)
Albumin: 2.5 g/dL — ABNORMAL LOW (ref 3.5–5.0)
Alkaline Phosphatase: 51 U/L (ref 38–126)
Anion gap: 6 (ref 5–15)
BUN: <5 mg/dL — ABNORMAL LOW (ref 6–20)
CHLORIDE: 112 mmol/L — AB (ref 101–111)
CO2: 23 mmol/L (ref 22–32)
Calcium: 8.1 mg/dL — ABNORMAL LOW (ref 8.9–10.3)
Creatinine, Ser: 0.82 mg/dL (ref 0.61–1.24)
Glucose, Bld: 102 mg/dL — ABNORMAL HIGH (ref 65–99)
POTASSIUM: 3.3 mmol/L — AB (ref 3.5–5.1)
SODIUM: 141 mmol/L (ref 135–145)
Total Bilirubin: 0.7 mg/dL (ref 0.3–1.2)
Total Protein: 4.8 g/dL — ABNORMAL LOW (ref 6.5–8.1)

## 2015-07-18 LAB — HEPATITIS PANEL, ACUTE
HEP B C IGM: NEGATIVE
Hep A IgM: NEGATIVE
Hepatitis B Surface Ag: NEGATIVE

## 2015-07-18 LAB — CBC
HCT: 37.6 % — ABNORMAL LOW (ref 39.0–52.0)
HEMOGLOBIN: 12.4 g/dL — AB (ref 13.0–17.0)
MCH: 29.3 pg (ref 26.0–34.0)
MCHC: 33 g/dL (ref 30.0–36.0)
MCV: 88.9 fL (ref 78.0–100.0)
Platelets: 219 10*3/uL (ref 150–400)
RBC: 4.23 MIL/uL (ref 4.22–5.81)
RDW: 13.8 % (ref 11.5–15.5)
WBC: 7.7 10*3/uL (ref 4.0–10.5)

## 2015-07-18 LAB — PROTIME-INR
INR: 1.19 (ref 0.00–1.49)
PROTHROMBIN TIME: 15.3 s — AB (ref 11.6–15.2)

## 2015-07-18 LAB — EPSTEIN-BARR VIRUS VCA, IGM: EBV VCA IgM: <36 U/mL (ref 0.0–35.9)

## 2015-07-18 LAB — EPSTEIN-BARR VIRUS VCA, IGG: EBV VCA IgG: 278 U/mL — ABNORMAL HIGH (ref 0.0–17.9)

## 2015-07-18 LAB — CMV IGM

## 2015-07-18 MED ORDER — POTASSIUM CHLORIDE IN NACL 20-0.9 MEQ/L-% IV SOLN
INTRAVENOUS | Status: DC
  Administered 2015-07-18 – 2015-07-20 (×5): via INTRAVENOUS
  Filled 2015-07-18 (×5): qty 1000

## 2015-07-18 MED ORDER — KETOROLAC TROMETHAMINE 15 MG/ML IJ SOLN
INTRAMUSCULAR | Status: AC
  Filled 2015-07-18: qty 1

## 2015-07-18 MED ORDER — KETOROLAC TROMETHAMINE 15 MG/ML IJ SOLN
15.0000 mg | Freq: Once | INTRAMUSCULAR | Status: AC
  Administered 2015-07-18: 15 mg via INTRAVENOUS

## 2015-07-18 NOTE — Progress Notes (Signed)
Spoke w/ Rayfield Citizenaroline at poison center, gave updated labs of INR 1.19, AST 239, ALT 948, CO2 23; poison center recommends discontinuing acetylcysteine.  Relayed to Donnamarie PoagK Kirby w/ [facility] and RN Clarita CraneLourdes.  Poison center will close their file on pt.  Vernard GamblesVeronda Itzayanna Kaster, PharmD, BCPS 07/18/2015 4:56 AM

## 2015-07-18 NOTE — Progress Notes (Signed)
[facility] PROGRESS NOTE  Devin [name] [id number]:[id number] DOB: 12/17/1980 DOA: 07/17/2015 PCP: Lethea Killingso PCP Per Patient  Assessment/Plan: 35 y/o male with PMH of Asthma presented with nausea, vomiting, myalgias. He recently took some tylenol for recent URI, congestion   Elevated LFTs, probable tylenol toxicity. [id number]. [id number].  INR 1.2. Lipase-21. Bili-1.2. Tylenol level <10 on admission. RUQ US: unremarkable.  -LFTs are improving. he reports feeling better. IV acetylcysteine. Acute hepatitis pnl-"neg", pend EBV. Cont IVF, supportive care   Asthma. Clinically stable. Cont prn albuterol   Code Status: full Family Communication: d/w patient, Charity fundraiserN (indicate person spoken with, relationship, and if by phone, the number) Disposition Plan: home likely AM   Consultants:  none  Procedures:  none  Antibiotics:  none (indicate start date, and stop date if known)  HPI/Subjective: Alert, no distress   Objective: Filed Vitals:   07/17/15 2127 07/18/15 0522  BP: 143/95 128/82  Pulse: 83 74  Temp: 98.3 F (36.8 C) 98 F (36.7 C)  Resp: 18 18    Intake/Output Summary (Last 24 hours) at 07/18/15 1138 Last data filed at 07/18/15 1019  Gross per 24 hour  Intake 3942.4 ml  Output    600 ml  Net 3342.4 ml   Filed Weights   07/17/15 0400  Weight: 87.544 kg (193 lb)    Exam:   General:  Comfortable   Cardiovascular: s1,s2 rrr  Respiratory: no wheezing   Abdomen: soft, nt, nd   Musculoskeletal: no leg edema    Data Reviewed: Basic Metabolic Panel:  Recent Labs Lab 07/17/15 0014 07/18/15 0315  NA 137 141  K 3.5 3.3*  CL 103 112*  CO2 22 23  GLUCOSE 103* 102*  BUN 5* <5*  CREATININE 0.90 0.82  CALCIUM 9.0 8.1*   Liver Function Tests:  Recent Labs Lab 07/17/15 0014 07/18/15 0315  AST 1128* 239*  ALT 1982* 948*  ALKPHOS 76 51  BILITOT 1.2 0.7  PROT 6.1* 4.8*  ALBUMIN 3.4* 2.5*    Recent Labs Lab 07/17/15 0014  LIPASE 21   No results for  input(s): AMMONIA in the last 168 hours. CBC:  Recent Labs Lab 07/17/15 0014 07/18/15 0315  WBC 10.0 7.7  HGB 15.2 12.4*  HCT 43.6 37.6*  MCV 88.1 88.9  PLT 269 219   Cardiac Enzymes:  Recent Labs Lab 07/17/15 1150  CKTOTAL 78   BNP (last 3 results) No results for input(s): BNP in the last 8760 hours.  ProBNP (last 3 results) No results for input(s): PROBNP in the last 8760 hours.  CBG: No results for input(s): GLUCAP in the last 168 hours.  No results found for this or any previous visit (from the past 240 hour(s)).   Studies: Dg Chest 2 View  07/17/2015  CLINICAL DATA:  Acute onset of cough and dizziness. Initial encounter. EXAM: CHEST  2 VIEW COMPARISON:  Chest radiograph performed 08/12/2014 FINDINGS: The lungs are well-aerated and clear. There is no evidence of focal opacification, pleural effusion or pneumothorax. The heart is normal in size; the mediastinal contour is within normal limits. No acute osseous abnormalities are seen. IMPRESSION: No acute cardiopulmonary process seen. Electronically Signed   By: Roanna RaiderJeffery  Chang M.D.   On: 07/17/2015 00:44   [place] Abdomen Limited Ruq  07/17/2015  CLINICAL DATA:  35 year old male with right upper quadrant abdominal pain. Elevated liver enzymes. EXAM: US ABDOMEN LIMITED - RIGHT UPPER QUADRANT COMPARISON:  None. FINDINGS: Gallbladder: No gallstones or wall thickening visualized. No sonographic Murphy sign noted  by sonographer. Common bile duct: Diameter: 2 mm Liver: No focal lesion identified. Within normal limits in parenchymal echogenicity. IMPRESSION: Unremarkable right upper quadrant ultrasound. Electronically Signed   By: Elgie Collard M.D.   On: 07/17/2015 03:19    Scheduled Meds: . Influenza vac split quadrivalent PF  0.5 mL Intramuscular Tomorrow-1000   Continuous Infusions: . sodium chloride 150 mL/hr at 07/17/15 1303    Principal Problem:   Transaminitis Active Problems:   Tooth ache    Time spent: >35  minutes     Esperanza Sheets  [facility] Pager [phone]. If 7PM-7AM, please contact night-coverage at [email or web address], password [facility] 07/18/2015, 11:38 AM  LOS: 1 day

## 2015-07-18 NOTE — Progress Notes (Addendum)
Utilization review completed.  L. J. Paraskevi Funez RN, BSN, CM 

## 2015-07-19 DIAGNOSIS — R740 Nonspecific elevation of levels of transaminase and lactic acid dehydrogenase [LDH]: Secondary | ICD-10-CM

## 2015-07-19 LAB — COMPREHENSIVE METABOLIC PANEL
ALT: 689 U/L — ABNORMAL HIGH (ref 17–63)
AST: 93 U/L — AB (ref 15–41)
Albumin: 2.7 g/dL — ABNORMAL LOW (ref 3.5–5.0)
Alkaline Phosphatase: 53 U/L (ref 38–126)
Anion gap: 8 (ref 5–15)
BILIRUBIN TOTAL: 0.9 mg/dL (ref 0.3–1.2)
CO2: 24 mmol/L (ref 22–32)
CREATININE: 0.76 mg/dL (ref 0.61–1.24)
Calcium: 8.5 mg/dL — ABNORMAL LOW (ref 8.9–10.3)
Chloride: 107 mmol/L (ref 101–111)
Glucose, Bld: 98 mg/dL (ref 65–99)
POTASSIUM: 3.6 mmol/L (ref 3.5–5.1)
Sodium: 139 mmol/L (ref 135–145)
TOTAL PROTEIN: 5.1 g/dL — AB (ref 6.5–8.1)

## 2015-07-19 LAB — RSV(RESPIRATORY SYNCYTIAL VIRUS) AB, BLOOD: RSV Ab: NEGATIVE

## 2015-07-19 MED ORDER — TRAMADOL HCL 50 MG PO TABS
50.0000 mg | ORAL_TABLET | Freq: Four times a day (QID) | ORAL | Status: DC | PRN
  Administered 2015-07-19 – 2015-07-20 (×2): 50 mg via ORAL
  Filled 2015-07-19 (×2): qty 1

## 2015-07-19 NOTE — Progress Notes (Signed)
[facility] PROGRESS NOTE  Devin [name] EYC:[id number] DOB: [birth year] DOA: 07/17/2015 PCP: No PCP Per Patient  Assessment/Plan: 1. Elevated transaminases -Initially presented with complaints of nausea vomiting associated with abdominal pain as lab work showed an AST 1120 with ALT of 1982. INR within normal limits 1.19 and PT of 15.3. -Further workup included a right upper quadrant ultrasound which was negative. -Acute hepatitis panel unremarkable. -He reported taking up to a grams in a 24-hour period of Tylenol several days prior to hospitalization were severe toothache. -By 07/19/2015 lab showing downward trend in transaminases have the an AST of 93 and ALT of 6 or 89  Code Status: Full code Family Communication: Family not present Disposition Plan: Anticipate discharge in the next 24 hours   HPI/Subjective: Mr Devin [name] is a 35 year old gentleman with a past medical history of asthma, admitted to the medicine service on 07/17/2015 presented with complaints of nausea and vomiting associate with epigastric pain. Several days prior to admission he had reported taking a total of a grams of Tylenol for a toothache. Lab work revealed elevated transaminases having AST of 1128 with ALT of 1982. Total bilirubin was within normal limits at 1.2, alk phosphatase at 76. It was suspected that elevated transaminases could be related to Tylenol. Over the course of his hospitalization his transaminases have improved, coming down on 07/19/2015 with AST of 93 and ALT of 689.  Objective: Filed Vitals:   07/18/15 2108 07/19/15 0620  BP: 145/93 137/95  Pulse: 70 92  Temp: 98.6 F (37 C) 97.9 F (36.6 C)  Resp: 17 18    Intake/Output Summary (Last 24 hours) at 07/19/15 1342 Last data filed at 07/19/15 0620  Gross per 24 hour  Intake   2210 ml  Output      0 ml  Net   2210 ml   Filed Weights   07/17/15 0400  Weight: 87.544 kg (193 lb)    Exam:   General:  Nontoxic appearing, nonjaundiced,  awake and alert  Cardiovascular: Regular rate rhythm normal S1-S2  Respiratory: Normal respiratory effort  Abdomen: Soft, having mild tenderness to palpation of right upper quadrant  Musculoskeletal: No edema  Data Reviewed: Basic Metabolic Panel:  Recent Labs Lab 07/17/15 0014 07/18/15 0315 07/19/15 0541  NA 137 141 139  K 3.5 3.3* 3.6  CL 103 112* 107  CO2 '22 23 24  '$ GLUCOSE 103* 102* 98  BUN 5* <5* <5*  CREATININE 0.90 0.82 0.76  CALCIUM 9.0 8.1* 8.5*   Liver Function Tests:  Recent Labs Lab 07/17/15 0014 07/18/15 0315 07/19/15 0541  AST 1128* 239* 93*  ALT 1982* 948* 689*  ALKPHOS 76 51 53  BILITOT 1.2 0.7 0.9  PROT 6.1* 4.8* 5.1*  ALBUMIN 3.4* 2.5* 2.7*    Recent Labs Lab 07/17/15 0014  LIPASE 21   No results for input(s): AMMONIA in the last 168 hours. CBC:  Recent Labs Lab 07/17/15 0014 07/18/15 0315  WBC 10.0 7.7  HGB 15.2 12.4*  HCT 43.6 37.6*  MCV 88.1 88.9  PLT 269 219   Cardiac Enzymes:  Recent Labs Lab 07/17/15 1150  CKTOTAL 78   BNP (last 3 results) No results for input(s): BNP in the last 8760 hours.  ProBNP (last 3 results) No results for input(s): PROBNP in the last 8760 hours.  CBG: No results for input(s): GLUCAP in the last 168 hours.  No results found for this or any previous visit (from the past 240 hour(s)).   Studies: No results  found.  Scheduled Meds:  Continuous Infusions: . 0.9 % NaCl with KCl 20 mEq / L 100 mL/hr at 07/19/15 0825    Principal Problem:   Transaminitis Active Problems:   Tooth ache    Time spent: 25 minutes    Kelvin Cellar  [facility] Pager [phone]. If 7PM-7AM, please contact night-coverage at [email or web address], password [facility] 07/19/2015, 1:42 PM  LOS: 2 days

## 2015-07-20 LAB — COMPREHENSIVE METABOLIC PANEL
ALBUMIN: 2.8 g/dL — AB (ref 3.5–5.0)
ALK PHOS: 57 U/L (ref 38–126)
ALT: 502 U/L — ABNORMAL HIGH (ref 17–63)
AST: 46 U/L — AB (ref 15–41)
Anion gap: 8 (ref 5–15)
BILIRUBIN TOTAL: 0.8 mg/dL (ref 0.3–1.2)
BUN: 6 mg/dL (ref 6–20)
CALCIUM: 8.9 mg/dL (ref 8.9–10.3)
CO2: 26 mmol/L (ref 22–32)
Chloride: 106 mmol/L (ref 101–111)
Creatinine, Ser: 0.78 mg/dL (ref 0.61–1.24)
GFR calc Af Amer: >60 mL/min (ref >60)
GLUCOSE: 110 mg/dL — AB (ref 65–99)
POTASSIUM: 3.7 mmol/L (ref 3.5–5.1)
Sodium: 140 mmol/L (ref 135–145)
TOTAL PROTEIN: 5.9 g/dL — AB (ref 6.5–8.1)

## 2015-07-20 MED ORDER — ALBUTEROL SULFATE HFA 108 (90 BASE) MCG/ACT IN AERS: 1.0000 | INHALATION_SPRAY | RESPIRATORY_TRACT | Status: DC | PRN

## 2015-07-20 MED ORDER — ALBUTEROL SULFATE (2.5 MG/3ML) 0.083% IN NEBU: 2.5000 mg | INHALATION_SOLUTION | Freq: Four times a day (QID) | RESPIRATORY_TRACT | Status: DC | PRN

## 2015-07-20 MED ORDER — TRAMADOL HCL 50 MG PO TABS: 50.0000 mg | ORAL_TABLET | Freq: Four times a day (QID) | ORAL | Status: DC | PRN

## 2015-07-20 MED ORDER — ALBUTEROL SULFATE HFA 108 (90 BASE) MCG/ACT IN AERS: 2.0000 | INHALATION_SPRAY | Freq: Four times a day (QID) | RESPIRATORY_TRACT | Status: DC | PRN

## 2015-07-20 NOTE — Progress Notes (Signed)
Pt discharged to home.  Discharge instructions explained to pt.  Pt has no questions at the time of discharge.  Pt states he has all belongings.  IV removed.  Pt ambulated off unit on his own.   

## 2015-07-20 NOTE — Discharge Summary (Signed)
Physician Discharge Summary  Devin [name] RCV:[id number] DOB: 02-10-1981 DOA: 07/17/2015  PCP: Devin [name]  Admit date: 07/17/2015 Discharge date: 07/20/2015  Time spent: 35 minutes  Recommendations for Outpatient Follow-up:  1. Please follow-up on liver function tests on hospital follow-up visit. During this hospitalization he had elevated transaminases suspect secondary to Tylenol 2. By day of discharge his AST had come down to 46 and ALT at 502 3. Recommended he not take Tylenol   Discharge Diagnoses:  Principal Problem:   Transaminitis Active Problems:   Tooth ache   Discharge Condition: Stable  Diet recommendation: Regular diet  Filed Weights   07/17/15 0400  Weight: 87.544 kg (193 lb)    History of present illness:  Devin [name] is a 35 y.o. male who presents to the ED with c/o nausea, vomiting, and epigastric pain. 3 days ago he began to have cough, congestion, and a tooth ache. 2 days ago he took 8 grams of tylenol over the course of a day for the tooth ache. Unfortunately that evening he developed abdominal pain, nausea, vomiting. He did not take any more tylenol but his symptoms have persisted. He denies fever or diarrhea. Devin known sick contacts. Devin flu shot this year. He presents to the ED.  Hospital Course:  Devin [name] is a 35 year old gentleman with a past medical history of asthma, admitted to the medicine service on 07/17/2015 presented with complaints of nausea and vomiting associate with epigastric pain. Several days prior to admission he had reported taking a total of a grams of Tylenol for a toothache. Lab work revealed elevated transaminases having AST of 1128 with ALT of 1982. Total bilirubin was within normal limits at 1.2, alk phosphatase at 76. It was suspected that elevated transaminases could be related to Tylenol. Over the course of his hospitalization his transaminases have improved, coming down on 07/19/2015 with AST of 93 and ALT of 689.  Repeat labs on 07/20/2015 showing AST of 46 with ALT of 502. Given clinical stability he was discharged on 07/20/2015. Prior to discharge case manager was consulted to set him up with hospital follow-up appointment.   Discharge Exam: Filed Vitals:   07/19/15 2125 07/20/15 0529  BP: 144/96 131/81  Pulse: 64 68  Temp: 99 F (37.2 C) 98.2 F (36.8 C)  Resp: 98 19     General: Nontoxic appearing, nonjaundiced, awake and alert  Cardiovascular: Regular rate rhythm normal S1-S2  Respiratory: Normal respiratory effort  Abdomen: Soft, nontender nondistended  Musculoskeletal: Devin edema  Discharge Instructions   Discharge Instructions    Call MD for:  difficulty breathing, headache or visual disturbances    Complete by:  As directed      Call MD for:  extreme fatigue    Complete by:  As directed      Call MD for:  hives    Complete by:  As directed      Call MD for:  persistant dizziness or light-headedness    Complete by:  As directed      Call MD for:  persistant nausea and vomiting    Complete by:  As directed      Call MD for:  redness, tenderness, or signs of infection (pain, swelling, redness, odor or green/yellow discharge around incision site)    Complete by:  As directed      Call MD for:  severe uncontrolled pain    Complete by:  As directed      Call MD for:  temperature >100.4    Complete by:  As directed      Call MD for:    Complete by:  As directed      Diet - low sodium heart healthy    Complete by:  As directed      Increase activity slowly    Complete by:  As directed           Current Discharge Medication List    START taking these medications   Details  albuterol (PROVENTIL HFA;VENTOLIN HFA) 108 (90 Base) MCG/ACT inhaler Inhale 2 puffs into the lungs every 6 (six) hours as needed for wheezing or shortness of breath. Qty: 1 Inhaler, Refills: 2    traMADol (ULTRAM) 50 MG tablet Take 1 tablet (50 mg total) by mouth every 6 (six) hours as needed for  moderate pain (HA). Qty: 20 tablet, Refills: 0      CONTINUE these medications which have CHANGED   Details  albuterol (PROVENTIL) (2.5 MG/3ML) 0.083% nebulizer solution Take 3 mLs (2.5 mg total) by nebulization every 6 (six) hours as needed for wheezing or shortness of breath. Qty: 75 mL, Refills: 12       Allergies  Allergen Reactions  . Amoxicillin-Pot Clavulanate Hives    Tolerates Amoxicillin - Thuy 09/29/12  . Other Nausea And Vomiting    coconut   Follow-up Information    Schedule an appointment as soon as possible for a visit with [place].   Contact information:   [address] [place] [id number] [phone]       The results of significant diagnostics from this hospitalization (including imaging, microbiology, ancillary and laboratory) are listed below for reference.    Significant Diagnostic Studies: Dg Chest 2 View  07/17/2015  CLINICAL DATA:  Acute onset of cough and dizziness. Initial encounter. EXAM: CHEST  2 VIEW COMPARISON:  Chest radiograph performed 08/12/2014 FINDINGS: The lungs are well-aerated and clear. There is Devin evidence of focal opacification, pleural effusion or pneumothorax. The heart is normal in size; the mediastinal contour is within normal limits. Devin acute osseous abnormalities are seen. IMPRESSION: Devin acute cardiopulmonary process seen. Electronically Signed   By: Garald Balding M.D.   On: 07/17/2015 00:44   US Abdomen Limited Ruq  07/17/2015  CLINICAL DATA:  35 year old male with right upper quadrant abdominal pain. Elevated liver enzymes. EXAM: US ABDOMEN LIMITED - RIGHT UPPER QUADRANT COMPARISON:  None. FINDINGS: Gallbladder: Devin gallstones or wall thickening visualized. Devin sonographic Murphy sign noted by sonographer. Common bile duct: Diameter: 2 mm Liver: Devin focal lesion identified. Within normal limits in parenchymal echogenicity. IMPRESSION: Unremarkable right upper quadrant ultrasound.  Electronically Signed   By: Anner Crete M.D.   On: 07/17/2015 03:19    Microbiology: Devin results found for this or any previous visit (from the past 240 hour(s)).   Labs: Basic Metabolic Panel:  Recent Labs Lab 07/17/15 0014 07/18/15 0315 07/19/15 0541 07/20/15 0502  NA 137 141 139 140  K 3.5 3.3* 3.6 3.7  CL 103 112* 107 106  CO2 '22 23 24 26  '$ GLUCOSE 103* 102* 98 110*  BUN 5* <5* <5* 6  CREATININE 0.90 0.82 0.76 0.78  CALCIUM 9.0 8.1* 8.5* 8.9   Liver Function Tests:  Recent Labs Lab 07/17/15 0014 07/18/15 0315 07/19/15 0541 07/20/15 0502  AST 1128* 239* 93* 46*  ALT 1982* 948* 689* 502*  ALKPHOS 76 51 53 57  BILITOT 1.2 0.7 0.9 0.8  PROT  6.1* 4.8* 5.1* 5.9*  ALBUMIN 3.4* 2.5* 2.7* 2.8*    Recent Labs Lab 07/17/15 0014  LIPASE 21   Devin results for input(s): AMMONIA in the last 168 hours. CBC:  Recent Labs Lab 07/17/15 0014 07/18/15 0315  WBC 10.0 7.7  HGB 15.2 12.4*  HCT 43.6 37.6*  MCV 88.1 88.9  PLT 269 219   Cardiac Enzymes:  Recent Labs Lab 07/17/15 1150  CKTOTAL 78   BNP: BNP (last 3 results) Devin results for input(s): BNP in the last 8760 hours.  ProBNP (last 3 results) Devin results for input(s): PROBNP in the last 8760 hours.  CBG: Devin results for input(s): GLUCAP in the last 168 hours.     Signed:  Kelvin Cellar MD.  [facility] 07/20/2015, 12:17 PM

## 2015-07-20 NOTE — Care Management Note (Signed)
Case Management Note  Patient Details  Name: Devin [name] MRN: [id number] Date of Birth: 07/07/1980  Subjective/Objective:                    Action/Plan: Patient has NEB machine at home already .  [facility] .  Patient given Dental Clinics information.  Patient voiced understanding.  Expected Discharge Date:                  Expected Discharge Plan:  Home w Home Health Services  In-House Referral:     Discharge planning Services  CM Consult, Indigent Health Clinic, [facility]MATCH Program, Medication Assistance  Post Acute Care Choice:    Choice offered to:  Patient  DME Arranged:    DME Agency:     HH Arranged:    HH Agency:     Status of Service:  Completed, signed off  Medicare Important Message Given:    Date Medicare IM Given:    Medicare IM give by:    Date Additional Medicare IM Given:    Additional Medicare Important Message give by:     If discussed at Long Length of Stay Meetings, dates discussed:    Additional Comments:  Kingsley PlanWile, Cortasia Screws Marie, RN 07/20/2015, 11:18 AM

## 2015-09-22 ENCOUNTER — Encounter (HOSPITAL_COMMUNITY): Payer: Self-pay | Admitting: Emergency Medicine

## 2015-09-22 ENCOUNTER — Emergency Department (HOSPITAL_COMMUNITY)
Admission: EM | Admit: 2015-09-22 | Discharge: 2015-09-22 | Disposition: A | Discharge status: Home / Self Care | Payer: Self-pay | Attending: Emergency Medicine | Admitting: Emergency Medicine

## 2015-09-22 DIAGNOSIS — K047 Periapical abscess without sinus: Secondary | ICD-10-CM | POA: Insufficient documentation

## 2015-09-22 DIAGNOSIS — Z87891 Personal history of nicotine dependence: Secondary | ICD-10-CM | POA: Insufficient documentation

## 2015-09-22 DIAGNOSIS — Z79899 Other long term (current) drug therapy: Secondary | ICD-10-CM | POA: Insufficient documentation

## 2015-09-22 DIAGNOSIS — J45909 Unspecified asthma, uncomplicated: Secondary | ICD-10-CM | POA: Insufficient documentation

## 2015-09-22 MED ORDER — PENICILLIN V POTASSIUM 250 MG PO TABS
500.0000 mg | ORAL_TABLET | Freq: Once | ORAL | Status: AC
  Administered 2015-09-22: 500 mg via ORAL
  Filled 2015-09-22: qty 2

## 2015-09-22 MED ORDER — TRAMADOL HCL 50 MG PO TABS: 50.0000 mg | ORAL_TABLET | Freq: Four times a day (QID) | ORAL | Status: DC | PRN

## 2015-09-22 MED ORDER — PENICILLIN V POTASSIUM 500 MG PO TABS: 500.0000 mg | ORAL_TABLET | Freq: Three times a day (TID) | ORAL | Status: DC

## 2015-09-22 NOTE — ED Provider Notes (Signed)
CSN: [id number]     Arrival date & time 09/22/15  1049 History  By signing my name below, I, Devin [name], attest that this documentation has been prepared under the direction and in the presence of [employer], PA-C. Electronically Signed: Evon [name], ED Scribe. 09/22/2015. 11:39 AM.     Chief Complaint  Patient presents with  . Dental Pain   The history is provided by the patient. No language interpreter was used.   HPI Comments: Devin [name] is a 35 y.o. male who presents to the Emergency Department complaining of lower right sided dental pain onset 1 day prior. Pt states that he feels as if he is developing a dental abscess as well. Pt states that the pain is worse when eating. Pt states that he has tried ibuprofen with no relief. Pt doenst report fever, nausea, vomiting or trouble swallowing.   Past Medical History  Diagnosis Date  . Asthma   . ADHD (attention deficit hyperactivity disorder)   . Transaminitis 07/17/2015   Past Surgical History  Procedure Laterality Date  . Tympanoplasty Left 1997   Family History  Problem Relation Age of Onset  . Heart failure Mother   . Hyperlipidemia Mother   . Hypertension Mother   . Migraines Mother   . COPD Mother   . Coronary artery disease Father   . Heart attack Father   . Heart disease Father   . Diabetes Maternal Aunt   . Asthma Maternal Grandfather   . Asthma Paternal Grandfather    Social History  Substance Use Topics  . Smoking status: Former Smoker -- 0.25 packs/day for 16 years    Types: Cigarettes    Quit date: 06/21/2014  . Smokeless tobacco: Never Used  . Alcohol Use: Yes     Comment: 07/17/2015 "I'll drink some watching football"    Review of Systems  Constitutional: Negative for fever.  HENT: Positive for dental problem and facial swelling. Negative for trouble swallowing.   Gastrointestinal: Negative for nausea and vomiting.     Allergies  Amoxicillin-pot clavulanate and Other  Home Medications    Prior to Admission medications   Medication Sig Start Date End Date Taking? Authorizing Provider  albuterol (PROVENTIL HFA;VENTOLIN HFA) 108 (90 Base) MCG/ACT inhaler Inhale 2 puffs into the lungs every 6 (six) hours as needed for wheezing or shortness of breath. 07/20/15   [name], MD  albuterol (PROVENTIL) (2.5 MG/3ML) 0.083% nebulizer solution Take 3 mLs (2.5 mg total) by nebulization every 6 (six) hours as needed for wheezing or shortness of breath. 07/20/15   [name], MD  traMADol (ULTRAM) 50 MG tablet Take 1 tablet (50 mg total) by mouth every 6 (six) hours as needed for moderate pain (HA). 07/20/15   [name], MD   BP 131/87 mmHg  Pulse 81  Temp(Src) 98.4 F (36.9 C) (Oral)  Resp 16  SpO2 98%   Physical Exam  Constitutional: He is oriented to person, place, and time. He appears well-developed and well-nourished. No distress.  HENT:  Head: Normocephalic and atraumatic.  Right Ear: Tympanic membrane, external ear and ear canal normal.  Left Ear: Tympanic membrane, external ear and ear canal normal.  Nose: Nose normal.  Mouth/Throat: Uvula is midline, oropharynx is clear and moist and mucous membranes are normal. No trismus in the jaw. Abnormal dentition. Dental caries present. No dental abscesses or uvula swelling. No tonsillar abscesses.  Patient with erythema and tenderness to the gums along the right mandibular premolars. No gross abscess  but there is a superficial pustule noted on the gumline approximately 7 mm in diameter.  Eyes: Conjunctivae and EOM are normal. Pupils are equal, round, and reactive to light.  Neck: Normal range of motion. Neck supple. No tracheal deviation present.  No neck swelling or Lugwig's angina  Cardiovascular: Normal rate.   Pulmonary/Chest: Effort normal. No respiratory distress.  Musculoskeletal: Normal range of motion.  Neurological: He is alert and oriented to person, place, and time.  Skin: Skin is warm and dry.   Psychiatric: He has a normal mood and affect. His behavior is normal.  Nursing note and vitals reviewed.   ED Course  Procedures (including critical care time) DIAGNOSTIC STUDIES: Oxygen Saturation is 98% on RA, normal by my interpretation.    COORDINATION OF CARE: 11:40 AM-Discussed treatment plan which includes penicillin and tramadol with pt at bedside and pt agreed to plan.    Vital signs reviewed and are as follows: BP 131/87 mmHg  Pulse 81  Temp(Src) 98.4 F (36.9 C) (Oral)  Resp 16  SpO2 98%  Small stab incision with a 25-gauge needle used to decompress the pustule on the gumline without difficulty.  Patient counseled on use of narcotic pain medications. Counseled not to combine these medications with others containing tylenol. Urged not to drink alcohol, drive, or perform any other activities that requires focus while taking these medications. The patient verbalizes understanding and agrees with the plan.  Patient counseled to take prescribed medications as directed, return with worsening facial or neck swelling, and to follow-up with their dentist as soon as possible.    MDM   Final diagnoses:  Dental infection   Patient with toothache. No fever. Exam unconcerning for Ludwig's angina or other deep tissue infection in neck.   As there is gum swelling, erythema, and facial swelling, will treat with antibiotic and pain medicine. Urged patient to follow-up with dentist.    I personally performed the services described in this documentation, which was scribed in my presence. The recorded information has been reviewed and is accurate.         Renne CriglerJoshua Loyal Holzheimer, PA-C 09/22/15 1145  Derwood KaplanAnkit Nanavati, MD 09/23/15 [phone]

## 2015-09-22 NOTE — Discharge Instructions (Signed)
Please read and follow all provided instructions.  Your diagnoses today include:  1. Dental infection     The exam and treatment you received today has been provided on an emergency basis only. This is not a substitute for complete medical or dental care.  Tests performed today include:  Vital signs. See below for your results today.   Medications prescribed:   Penicillin - antibiotic  You have been prescribed an antibiotic medicine: take the entire course of medicine even if you are feeling better. Stopping early can cause the antibiotic not to work.   Tramadol - narcotic-like pain medication  DO NOT drive or perform any activities that require you to be awake and alert because this medicine can make you drowsy.   Take any prescribed medications only as directed.  Home care instructions:  Follow any educational materials contained in this packet.  Follow-up instructions: Please follow-up with your dentist for further evaluation of your symptoms.   Dental Assistance: See below for dental referrals  Return instructions:   Please return to the Emergency Department if you experience worsening symptoms.  Please return if you develop a fever, you develop more swelling in your face or neck, you have trouble breathing or swallowing food.  Please return if you have any other emergent concerns.  Additional Information:  Your vital signs today were: BP 131/87 mmHg   Pulse 81   Temp(Src) 98.4 F (36.9 C) (Oral)   Resp 16   SpO2 98% If your blood pressure (BP) was elevated above 135/85 this visit, please have this repeated by your doctor within one month. -------------- [employer] is a great source of information about community services available.  Access by dialing 2-1-1 from anywhere in [place], or by website -  PooledIncome.plwww.nc211.org.   Other Local Resources (Updated 04/2015)  Dental  Care   Services    Phone Number and  Address  Cost  [facility] For children 1010 - 33 years of age:   Cleaning  Tooth brushing/flossing instruction  Sealants, fillings, crowns  Extractions  Emergency treatment  [phone] [address] [place], [place] [id number] Charges based on family income.  Medicaid and some insurance plans accepted.     [facility]  Cleaning  Sealants, fillings, crowns  Extractions  Emergency treatment [phone] [address] [place], [place]  Pregnant women 33 years of age or older with a Medicaid card  [facility] [place]  Cleaning  Sealants, fillings, crowns  Extractions  Emergency treatment [phone] [address] [place], [place] Pregnant women 33 years of age or older with a Medicaid card  [facility] For children 800 - 33 years of age:   Cleaning  Tooth brushing/flossing instruction  Sealants, fillings, crowns  Extractions  Emergency treatment Limited orthodontic services for patients with Medicaid [phone] [address] [place], [place] [id number] Medicaid and [facility] Health Choice cover for children up to age 33 and pregnant women.  Parents of children up to age 33 without Medicaid pay a reduced fee at time of service.  [facility] [place] For children 860 - 33 years of age:   Cleaning  Tooth brushing/flossing instruction  Sealants, fillings, crowns  Extractions  Emergency treatment Limited orthodontic services for patients with Medicaid [phone] [address] [place], [place].  Medicaid and [place] Health Choice cover for children up to age 34 and pregnant women.  Parents of children up to age 68 without Medicaid pay a reduced fee.  [facility]  Sealants, fillings, crowns  Extractions  Hours:  Tuesdays and Thursdays, 4:15 - 8 pm [phone] [address] [place], [place] [address] Services free of charge to [facility] residents ages 18-64 who do not have health insurance, Medicare, [place], or [place] benefits and fall within federal poverty guidelines  [employer]    Provides dental care in addition to primary medical care, nutritional counseling, and pharmacy:  Nurse, mental health, fillings, crowns  Extractions                  [phone] [facility], [address] [place], [place]  [phone] [facility], [address] [place], [place]  [phone] [facility] [place], [place]  [phone] [facility], [address] [place], [place]  [phone] [facility] [address] [place], [place] Accepts [place], [place], most insurance.  Also provides services available to all with fees adjusted based on ability to pay.    [facility]  Cleaning  Tooth brushing/flossing instruction  Sealants, fillings, crowns  Extractions  Emergency treatment Hours: Tuesdays, Thursdays, and Fridays from 8 am to 5 pm by appointment only. [phone] [address] [place], [place] [address] [facility] residents with Medicaid (depending on eligibility) and children with [facility] Health Choice - call for more information.  [facility]  Extractions only  Hours: 2nd and 4th Thursday of each month from 6:30 am - 9 am.   [phone] [address] [place], [place] [address] Ages 50 and older only.  Patients are seen on a first come, first served basis.  [employer]  [employer]  Extractions  Orthodontics  Endodontics  Implants/Crowns/Bridges  Complete and partial dentures [phone] [place],  Patients must complete an  application for services.  There is often a waiting list.

## 2015-09-22 NOTE — ED Notes (Signed)
Pt here with dental pain to bottom right teeth. Pt reports pain started this morning.

## 2015-10-12 ENCOUNTER — Emergency Department (HOSPITAL_COMMUNITY)
Admission: EM | Admit: 2015-10-12 | Discharge: 2015-10-12 | Disposition: A | Discharge status: Home / Self Care | Payer: Self-pay | Attending: Emergency Medicine | Admitting: Emergency Medicine

## 2015-10-12 ENCOUNTER — Encounter (HOSPITAL_COMMUNITY): Payer: Self-pay

## 2015-10-12 DIAGNOSIS — J45901 Unspecified asthma with (acute) exacerbation: Secondary | ICD-10-CM

## 2015-10-12 DIAGNOSIS — J45909 Unspecified asthma, uncomplicated: Secondary | ICD-10-CM | POA: Insufficient documentation

## 2015-10-12 DIAGNOSIS — Z79899 Other long term (current) drug therapy: Secondary | ICD-10-CM | POA: Insufficient documentation

## 2015-10-12 DIAGNOSIS — Z87891 Personal history of nicotine dependence: Secondary | ICD-10-CM | POA: Insufficient documentation

## 2015-10-12 MED ORDER — PREDNISONE 20 MG PO TABS
40.0000 mg | ORAL_TABLET | Freq: Once | ORAL | Status: AC
  Administered 2015-10-12: 40 mg via ORAL
  Filled 2015-10-12: qty 2

## 2015-10-12 MED ORDER — PREDNISONE 20 MG PO TABS: ORAL_TABLET | ORAL | Status: DC

## 2015-10-12 MED ORDER — ALBUTEROL SULFATE HFA 108 (90 BASE) MCG/ACT IN AERS
2.0000 | INHALATION_SPRAY | RESPIRATORY_TRACT | Status: DC | PRN
  Administered 2015-10-12: 2 via RESPIRATORY_TRACT
  Filled 2015-10-12: qty 6.7

## 2015-10-12 NOTE — Discharge Instructions (Signed)
Asthma, Adult Use your albuterol inhaler with spacer 2 puffs every 4 hours as needed for shortness of breath or wheeze. Return to the emergency department if needed more than every 4 hours. Start taking the prednisone prescribed tomorrow evening. [facility] the [facility] tomorrow to arrange to get a primary care physician. They can help you to get medications and help you with all medical problems. Asthma is a recurring condition in which the airways tighten and narrow. Asthma can make it difficult to breathe. It can cause coughing, wheezing, and shortness of breath. Asthma episodes, also called asthma attacks, range from minor to life-threatening. Asthma cannot be cured, but medicines and lifestyle changes can help control it. CAUSES Asthma is believed to be caused by inherited (genetic) and environmental factors, but its exact cause is unknown. Asthma may be triggered by allergens, lung infections, or irritants in the air. Asthma triggers are different for each person. Common triggers include:   Animal dander.  Dust mites.  Cockroaches.  Pollen from trees or grass.  Mold.  Smoke.  Air pollutants such as dust, household cleaners, hair sprays, aerosol sprays, paint fumes, strong chemicals, or strong odors.  Cold air, weather changes, and winds (which increase molds and pollens in the air).  Strong emotional expressions such as crying or laughing hard.  Stress.  Certain medicines (such as aspirin) or types of drugs (such as beta-blockers).  Sulfites in foods and drinks. Foods and drinks that may contain sulfites include dried fruit, potato chips, and sparkling grape juice.  Infections or inflammatory conditions such as the flu, a cold, or an inflammation of the nasal membranes (rhinitis).  Gastroesophageal reflux disease (GERD).  Exercise or strenuous activity. SYMPTOMS Symptoms may occur immediately after asthma is triggered or many hours later. Symptoms  include:  Wheezing.  Excessive nighttime or early morning coughing.  Frequent or severe coughing with a common cold.  Chest tightness.  Shortness of breath. DIAGNOSIS  The diagnosis of asthma is made by a review of your medical history and a physical exam. Tests may also be performed. These may include:  Lung function studies. These tests show how much air you breathe in and out.  Allergy tests.  Imaging tests such as X-rays. TREATMENT  Asthma cannot be cured, but it can usually be controlled. Treatment involves identifying and avoiding your asthma triggers. It also involves medicines. There are 2 classes of medicine used for asthma treatment:   Controller medicines. These prevent asthma symptoms from occurring. They are usually taken every day.  Reliever or rescue medicines. These quickly relieve asthma symptoms. They are used as needed and provide short-term relief. Your health care provider will help you create an asthma action plan. An asthma action plan is a written plan for managing and treating your asthma attacks. It includes a list of your asthma triggers and how they may be avoided. It also includes information on when medicines should be taken and when their dosage should be changed. An action plan may also involve the use of a device called a peak flow meter. A peak flow meter measures how well the lungs are working. It helps you monitor your condition. HOME CARE INSTRUCTIONS   Take medicines only as directed by your health care provider. Speak with your health care provider if you have questions about how or when to take the medicines.  Use a peak flow meter as directed by your health care provider. Record and keep track of readings.  Understand  and use the action plan to help minimize or stop an asthma attack without needing to seek medical care.  Control your home environment in the following ways to help prevent asthma attacks:  Do not smoke. Avoid being exposed to  secondhand smoke.  Change your heating and air conditioning filter regularly.  Limit your use of fireplaces and wood stoves.  Get rid of pests (such as roaches and mice) and their droppings.  Throw away plants if you see mold on them.  Clean your floors and dust regularly. Use unscented cleaning products.  Try to have someone else vacuum for you regularly. Stay out of rooms while they are being vacuumed and for a short while afterward. If you vacuum, use a dust mask from a hardware store, a double-layered or microfilter vacuum cleaner bag, or a vacuum cleaner with a HEPA filter.  Replace carpet with wood, tile, or vinyl flooring. Carpet can trap dander and dust.  Use allergy-proof pillows, mattress covers, and box spring covers.  Wash bed sheets and blankets every week in hot water and dry them in a dryer.  Use blankets that are made of polyester or cotton.  Clean bathrooms and kitchens with bleach. If possible, have someone repaint the walls in these rooms with mold-resistant paint. Keep out of the rooms that are being cleaned and painted.  Wash hands frequently. SEEK MEDICAL CARE IF:   You have wheezing, shortness of breath, or a cough even if taking medicine to prevent attacks.  The colored mucus you cough up (sputum) is thicker than usual.  Your sputum changes from clear or white to yellow, green, gray, or bloody.  You have any problems that may be related to the medicines you are taking (such as a rash, itching, swelling, or trouble breathing).  You are using a reliever medicine more than 2-3 times per week.  Your peak flow is still at 50-79% of your personal best after following your action plan for 1 hour.  You have a fever. SEEK IMMEDIATE MEDICAL CARE IF:   You seem to be getting worse and are unresponsive to treatment during an asthma attack.  You are short of breath even at rest.  You get short of breath when doing very little physical activity.  You have  difficulty eating, drinking, or talking due to asthma symptoms.  You develop chest pain.  You develop a fast heartbeat.  You have a bluish color to your lips or fingernails.  You are light-headed, dizzy, or faint.  Your peak flow is less than 50% of your personal best.   This information is not intended to replace advice given to you by your health care provider. Make sure you discuss any questions you have with your health care provider.   Document Released: 04/08/2005 Document Revised: 12/28/2014 Document Reviewed: 11/05/2012 Elsevier Interactive Patient Education [employer] Elsevier Inc.

## 2015-10-12 NOTE — ED Provider Notes (Signed)
CSN: [id number]     Arrival date & time 10/12/15  1815 History   First MD Initiated Contact with Patient 10/12/15 1936     Chief Complaint  Patient presents with  . Asthma     (Consider location/radiation/quality/duration/timing/severity/associated sxs/prior Treatment) HPI Patient developed wheeze typical of asthma onset yesterday. He treated himself with albuterol nebulizer 2 treatments at home and received additional albuterol nebulizer treatment by EMS. His breathing is presently normal. No other associated symptoms. No cough or fever. Presently asymptomatic Past Medical History  Diagnosis Date  . Asthma   . ADHD (attention deficit hyperactivity disorder)   . Transaminitis 07/17/2015   Past Surgical History  Procedure Laterality Date  . Tympanoplasty Left 1997   Family History  Problem Relation Age of Onset  . Heart failure Mother   . Hyperlipidemia Mother   . Hypertension Mother   . Migraines Mother   . COPD Mother   . Coronary artery disease Father   . Heart attack Father   . Heart disease Father   . Diabetes Maternal Aunt   . Asthma Maternal Grandfather   . Asthma Paternal Grandfather    Social History  Substance Use Topics  . Smoking status: Former Smoker -- 0.25 packs/day for 16 years    Types: Cigarettes    Quit date: 06/21/2014  . Smokeless tobacco: Never Used  . Alcohol Use: Yes     Comment: 07/17/2015 "I'll drink some watching football"    Review of Systems  Constitutional: Negative.   HENT: Negative.   Respiratory: Positive for shortness of breath and wheezing.   Cardiovascular: Negative.   Gastrointestinal: Negative.   Musculoskeletal: Negative.   Skin: Negative.   Neurological: Negative.   Psychiatric/Behavioral: Negative.   All other systems reviewed and are negative.     Allergies  Amoxicillin-pot clavulanate and Other  Home Medications   Prior to Admission medications   Medication Sig Start Date End Date Taking? Authorizing Provider   albuterol (PROVENTIL) (2.5 MG/3ML) 0.083% nebulizer solution Take 3 mLs (2.5 mg total) by nebulization every 6 (six) hours as needed for wheezing or shortness of breath. 07/20/15  Yes Jeralyn BennettEzequiel Zamora, MD  ibuprofen (ADVIL,MOTRIN) 400 MG tablet Take 400 mg by mouth every 6 (six) hours as needed.   Yes Historical Provider, MD  albuterol (PROVENTIL HFA;VENTOLIN HFA) 108 (90 Base) MCG/ACT inhaler Inhale 2 puffs into the lungs every 6 (six) hours as needed for wheezing or shortness of breath. Patient not taking: Reported on 10/12/2015 07/20/15   Jeralyn BennettEzequiel Zamora, MD  penicillin v potassium (VEETID) 500 MG tablet Take 1 tablet (500 mg total) by mouth 3 (three) times daily. 09/22/15   Renne CriglerJoshua Geiple, PA-C  predniSONE (DELTASONE) 20 MG tablet 2 tabs po daily x 4 days starting 10/13/15 10/12/15   Doug SouSam George Haggart, MD  traMADol (ULTRAM) 50 MG tablet Take 1 tablet (50 mg total) by mouth every 6 (six) hours as needed. 09/22/15   Renne CriglerJoshua Geiple, PA-C   BP 124/88 mmHg  Pulse 70  Temp(Src) 97.9 F (36.6 C) (Oral)  Resp 20  SpO2 97% Physical Exam  Constitutional: He is oriented to person, place, and time. He appears well-developed and well-nourished.  HENT:  Head: Normocephalic and atraumatic.  Eyes: Conjunctivae are normal. Pupils are equal, round, and reactive to light.  Neck: Neck supple. No tracheal deviation present. No thyromegaly present.  Cardiovascular: Normal rate and regular rhythm.   No murmur heard. Pulmonary/Chest: Effort normal and breath sounds normal.  Abdominal: Soft. Bowel sounds are  normal. He exhibits no distension. There is no tenderness.  Musculoskeletal: Normal range of motion. He exhibits no edema or tenderness.  Neurological: He is alert and oriented to person, place, and time. Coordination normal.  Skin: Skin is warm and dry. No rash noted.  Psychiatric: He has a normal mood and affect.  Nursing note and vitals reviewed.   ED Course  Procedures (including critical care time) Labs  Review Labs Reviewed - No data to display  Imaging Review No results found. I have personally reviewed and evaluated these images and lab results as part of my medical decision-making.   EKG Interpretation None      MDM  Plan he'll receive an albuterol HFA to go. He has his own spacer at home. He's run out of Nebules. He is requesting an HFA. Prednisone prior to discharge. Prescription for prednisone. Referral [facility] in community wellness Center. Final diagnoses:  Asthma attack  Diagnosis asthma attack      Doug SouSam Ife Vitelli, MD 10/12/15 2011

## 2015-10-12 NOTE — ED Notes (Signed)
[facility] c/o asthma attack since 1200 today.  Pt had 2 home neb tx by 1400 with little relief.  EMS gave albuterol treatment in route.

## 2015-11-03 ENCOUNTER — Emergency Department (HOSPITAL_COMMUNITY): Payer: Self-pay

## 2015-11-03 ENCOUNTER — Encounter (HOSPITAL_COMMUNITY): Payer: Self-pay | Admitting: Emergency Medicine

## 2015-11-03 ENCOUNTER — Inpatient Hospital Stay (HOSPITAL_COMMUNITY)
Admission: EM | Admit: 2015-11-03 | Discharge: 2015-11-06 | DRG: 190 | Disposition: A | Discharge status: Home / Self Care | Payer: Self-pay | Attending: Internal Medicine | Admitting: Internal Medicine

## 2015-11-03 DIAGNOSIS — J9601 Acute respiratory failure with hypoxia: Secondary | ICD-10-CM | POA: Diagnosis present

## 2015-11-03 DIAGNOSIS — F909 Attention-deficit hyperactivity disorder, unspecified type: Secondary | ICD-10-CM | POA: Diagnosis present

## 2015-11-03 DIAGNOSIS — Z881 Allergy status to other antibiotic agents status: Secondary | ICD-10-CM

## 2015-11-03 DIAGNOSIS — J189 Pneumonia, unspecified organism: Secondary | ICD-10-CM | POA: Diagnosis present

## 2015-11-03 DIAGNOSIS — K219 Gastro-esophageal reflux disease without esophagitis: Secondary | ICD-10-CM | POA: Diagnosis present

## 2015-11-03 DIAGNOSIS — J45901 Unspecified asthma with (acute) exacerbation: Secondary | ICD-10-CM | POA: Diagnosis present

## 2015-11-03 DIAGNOSIS — R0902 Hypoxemia: Secondary | ICD-10-CM | POA: Insufficient documentation

## 2015-11-03 DIAGNOSIS — I445 Left posterior fascicular block: Secondary | ICD-10-CM | POA: Diagnosis present

## 2015-11-03 DIAGNOSIS — Z8249 Family history of ischemic heart disease and other diseases of the circulatory system: Secondary | ICD-10-CM

## 2015-11-03 DIAGNOSIS — J441 Chronic obstructive pulmonary disease with (acute) exacerbation: Principal | ICD-10-CM | POA: Diagnosis present

## 2015-11-03 DIAGNOSIS — Z87891 Personal history of nicotine dependence: Secondary | ICD-10-CM

## 2015-11-03 DIAGNOSIS — D72829 Elevated white blood cell count, unspecified: Secondary | ICD-10-CM | POA: Diagnosis present

## 2015-11-03 DIAGNOSIS — Z91018 Allergy to other foods: Secondary | ICD-10-CM

## 2015-11-03 DIAGNOSIS — Z7951 Long term (current) use of inhaled steroids: Secondary | ICD-10-CM

## 2015-11-03 DIAGNOSIS — Z825 Family history of asthma and other chronic lower respiratory diseases: Secondary | ICD-10-CM

## 2015-11-03 DIAGNOSIS — Z79899 Other long term (current) drug therapy: Secondary | ICD-10-CM

## 2015-11-03 DIAGNOSIS — Z833 Family history of diabetes mellitus: Secondary | ICD-10-CM

## 2015-11-03 HISTORY — DX: Gastro-esophageal reflux disease without esophagitis: K21.9

## 2015-11-03 LAB — CBC WITH DIFFERENTIAL/PLATELET
BASOS PCT: 0 %
Basophils Absolute: 0 10*3/uL (ref 0.0–0.1)
EOS ABS: 0 10*3/uL (ref 0.0–0.7)
EOS PCT: 0 %
HCT: 46.1 % (ref 39.0–52.0)
Hemoglobin: 16.1 g/dL (ref 13.0–17.0)
Lymphocytes Relative: 3 %
Lymphs Abs: 0.6 10*3/uL — ABNORMAL LOW (ref 0.7–4.0)
MCH: 30.9 pg (ref 26.0–34.0)
MCHC: 34.9 g/dL (ref 30.0–36.0)
MCV: 88.5 fL (ref 78.0–100.0)
MONO ABS: 0.1 10*3/uL (ref 0.1–1.0)
MONOS PCT: 1 %
Neutro Abs: 17.7 10*3/uL — ABNORMAL HIGH (ref 1.7–7.7)
Neutrophils Relative %: 96 %
Platelets: 386 10*3/uL (ref 150–400)
RBC: 5.21 MIL/uL (ref 4.22–5.81)
RDW: 13.1 % (ref 11.5–15.5)
WBC: 18.5 10*3/uL — ABNORMAL HIGH (ref 4.0–10.5)

## 2015-11-03 LAB — BASIC METABOLIC PANEL
Anion gap: 9 (ref 5–15)
BUN: 11 mg/dL (ref 6–20)
CALCIUM: 9.4 mg/dL (ref 8.9–10.3)
CO2: 21 mmol/L — AB (ref 22–32)
CREATININE: 0.99 mg/dL (ref 0.61–1.24)
Chloride: 107 mmol/L (ref 101–111)
GFR calc non Af Amer: >60 mL/min (ref >60)
GLUCOSE: 127 mg/dL — AB (ref 65–99)
Potassium: 3.9 mmol/L (ref 3.5–5.1)
Sodium: 137 mmol/L (ref 135–145)

## 2015-11-03 MED ORDER — ACETAMINOPHEN 325 MG PO TABS
650.0000 mg | ORAL_TABLET | Freq: Four times a day (QID) | ORAL | Status: DC | PRN
  Administered 2015-11-03 – 2015-11-04 (×2): 650 mg via ORAL
  Filled 2015-11-03 (×2): qty 2

## 2015-11-03 MED ORDER — BUDESONIDE-FORMOTEROL FUMARATE 80-4.5 MCG/ACT IN AERO
2.0000 | INHALATION_SPRAY | Freq: Two times a day (BID) | RESPIRATORY_TRACT | Status: DC
  Administered 2015-11-03 – 2015-11-04 (×2): 2 via RESPIRATORY_TRACT
  Filled 2015-11-03: qty 6.9

## 2015-11-03 MED ORDER — ALBUTEROL (5 MG/ML) CONTINUOUS INHALATION SOLN
10.0000 mg/h | INHALATION_SOLUTION | RESPIRATORY_TRACT | Status: DC
  Administered 2015-11-03: 10 mg/h via RESPIRATORY_TRACT
  Filled 2015-11-03: qty 20

## 2015-11-03 MED ORDER — ONDANSETRON HCL 4 MG PO TABS: 4.0000 mg | ORAL_TABLET | Freq: Four times a day (QID) | ORAL | Status: DC | PRN

## 2015-11-03 MED ORDER — METHYLPREDNISOLONE SODIUM SUCC 125 MG IJ SOLR
125.0000 mg | Freq: Once | INTRAMUSCULAR | Status: AC
  Administered 2015-11-03: 125 mg via INTRAVENOUS
  Filled 2015-11-03: qty 2

## 2015-11-03 MED ORDER — ACETAMINOPHEN 650 MG RE SUPP: 650.0000 mg | Freq: Four times a day (QID) | RECTAL | Status: DC | PRN

## 2015-11-03 MED ORDER — ALBUTEROL SULFATE (2.5 MG/3ML) 0.083% IN NEBU: 2.5000 mg | INHALATION_SOLUTION | RESPIRATORY_TRACT | Status: DC | PRN

## 2015-11-03 MED ORDER — ONDANSETRON HCL 4 MG/2ML IJ SOLN: 4.0000 mg | Freq: Four times a day (QID) | INTRAMUSCULAR | Status: DC | PRN

## 2015-11-03 MED ORDER — ZOLPIDEM TARTRATE 5 MG PO TABS
5.0000 mg | ORAL_TABLET | Freq: Every evening | ORAL | Status: DC | PRN
  Administered 2015-11-03: 5 mg via ORAL
  Filled 2015-11-03: qty 1

## 2015-11-03 MED ORDER — LORAZEPAM 2 MG/ML IJ SOLN
1.0000 mg | Freq: Once | INTRAMUSCULAR | Status: AC
  Administered 2015-11-03: 1 mg via INTRAVENOUS
  Filled 2015-11-03: qty 1

## 2015-11-03 MED ORDER — ALBUTEROL SULFATE (2.5 MG/3ML) 0.083% IN NEBU
2.5000 mg | INHALATION_SOLUTION | RESPIRATORY_TRACT | Status: DC
  Administered 2015-11-03 – 2015-11-04 (×5): 2.5 mg via RESPIRATORY_TRACT
  Filled 2015-11-03 (×5): qty 3

## 2015-11-03 MED ORDER — ENOXAPARIN SODIUM 40 MG/0.4ML ~~LOC~~ SOLN
40.0000 mg | SUBCUTANEOUS | Status: DC
  Administered 2015-11-03: 40 mg via SUBCUTANEOUS
  Filled 2015-11-03: qty 0.4

## 2015-11-03 MED ORDER — ALBUTEROL SULFATE (2.5 MG/3ML) 0.083% IN NEBU
5.0000 mg | INHALATION_SOLUTION | Freq: Once | RESPIRATORY_TRACT | Status: AC
  Administered 2015-11-03: 5 mg via RESPIRATORY_TRACT
  Filled 2015-11-03: qty 6

## 2015-11-03 MED ORDER — PREDNISONE 50 MG PO TABS: 50.0000 mg | ORAL_TABLET | Freq: Every day | ORAL | Status: DC

## 2015-11-03 MED ORDER — ALBUTEROL SULFATE (2.5 MG/3ML) 0.083% IN NEBU: 5.0000 mg | INHALATION_SOLUTION | Freq: Once | RESPIRATORY_TRACT | Status: DC

## 2015-11-03 MED ORDER — MAGNESIUM SULFATE 2 GM/50ML IV SOLN
2.0000 g | Freq: Once | INTRAVENOUS | Status: AC
  Administered 2015-11-03: 2 g via INTRAVENOUS
  Filled 2015-11-03 (×2): qty 50

## 2015-11-03 NOTE — H&P (Signed)
History and Physical    Devin [name] YNW:[id number] DOB: 08/25/1980 DOA: 11/03/2015  PCP: No PCP Per Patient Consultants:  None Patient coming from: home - lives with girlfriend and 2 kids  Chief Complaint: SOB/wheezing   HPI: Devin [name] is a 35 y.o. male with medical history significant of asthma presenting with acute respiratory failure.  Patient awoke about 0250 this AM with coughing.  Got a little better but then it recurred about 0800.  Unsure what brought it on, was just lying in the bed.  No sick contacts.  No PCP, comes to ER when this happens.  Had 2 Duonebs and 1 albuterol neb without relief.  +SOB, wheezing, tight chest.  Occasional cold sweats but no fevers.  No URI symptoms.    3 prior overnight hospitalizations.  Diagnosed with asthma at age 89yo.  No intubations.  No controlling meds - always sent home with Symbicort but can't afford it and so stops taking it ("it's a wonderful drug, it works wonders").  [facility] and [facility] been [facility].  Last exacerbation was in June, not this bad.  Symptoms tend to be worse in heat of summer.  Cough was so bad this AM he was dry heaving.    ED Course: Continuous neb for 30 minutes and 15 minutes; Solumedrol 250 mg IV, Magnesium  Review of Systems: As per HPI; otherwise 10 point review of systems reviewed and negative.   Ambulatory Status: ambulates without assistance  Past Medical History  Diagnosis Date  . Asthma   . ADHD (attention deficit hyperactivity disorder)   . Transaminitis 07/17/2015    Past Surgical History  Procedure Laterality Date  . Tympanoplasty Left 1997    Social History   Social History  . Marital Status: Single    Spouse Name: N/A  . Number of Children: N/A  . 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 Smoking status: Former Smoker -- 0.25 packs/day for 16 years    Types: Cigarettes    Quit date: 06/21/2014    . Smokeless tobacco: Never Used  . Alcohol Use: 0.0 oz/week    0 Standard drinks or equivalent per week     Comment: 07/17/2015 "I'll drink some watching football"; "not all the time, social events"  . Drug Use: Yes     Comment: edible marijuana - last use 2-3 weeks ago  . Sexual Activity: Not Currently   Other Topics Concern  . Not on file   Social History Narrative    Allergies  Allergen Reactions  . Amoxicillin-Pot Clavulanate Hives    Tolerates Amoxicillin - Thuy 09/29/12  . Other Nausea And Vomiting    coconut    Family History  Problem Relation Age of Onset  . Heart failure Mother   . Hyperlipidemia Mother   . Hypertension Mother   . Migraines Mother   . COPD Mother   . Coronary artery disease Father   . Heart attack Father   . Heart disease Father   . Diabetes Maternal Aunt   . Asthma Maternal Grandfather   . Asthma Paternal Grandfather     Prior to Admission medications   Medication Sig Start Date End Date Taking? Authorizing Provider  albuterol (PROVENTIL) (2.5 MG/3ML) 0.083% nebulizer solution Take 3 mLs (2.5 mg total) by nebulization every 6 (six) hours as needed for wheezing or shortness of breath. 07/20/15  Yes Jeralyn BennettEzequiel Zamora,  MD  ibuprofen (ADVIL,MOTRIN) 400 MG tablet Take 400 mg by mouth every 6 (six) hours as needed.   Yes Historical Provider, MD  albuterol (PROVENTIL HFA;VENTOLIN HFA) 108 (90 Base) MCG/ACT inhaler Inhale 2 puffs into the lungs every 6 (six) hours as needed for wheezing or shortness of breath. Patient not taking: Reported on 11/03/2015 07/20/15   [name], MD    Physical Exam: Filed Vitals:   11/03/15 1500 11/03/15 1530 11/03/15 1600 11/03/15 1630  BP: 107/79 127/63 128/84 125/79  Pulse: 82 84 93 94  Temp:      TempSrc:      Resp: SpO2: 96% 97% 96% 96%     General: Appears anxious, tripoding Eyes:  PERRL, EOMI, normal lids, iris ENT:  grossly normal hearing, lips & tongue, mmm Neck:  no LAD, masses or  thyromegaly Cardiovascular:  RRR, no m/r/g. No LE edema.  Respiratory: Increased respiratory effort, but not hypoxic and lung fields are generally moving good air with scattered wheezes. Abdomen:  soft, ntnd, NABS Skin:  no rash or induration seen on limited exam Musculoskeletal:  grossly normal tone BUE/BLE, good ROM, no bony abnormality Psychiatric:  grossly normal mood and affect, speech fluent and appropriate, AOx3 Neurologic:  CN 2-12 grossly intact, moves all extremities in coordinated fashion, sensation intact  Labs on Admission: I have personally reviewed following labs and imaging studies  CBC:  Recent Labs Lab 11/03/15 1410  WBC 18.5*  NEUTROABS 17.7*  HGB 16.1  HCT 46.1  MCV 88.5  PLT 386   Basic Metabolic Panel:  Recent Labs Lab 11/03/15 1410  NA 137  K 3.9  CL 107  CO2 21*  GLUCOSE 127*  BUN 11  CREATININE 0.99  CALCIUM 9.4   GFR: CrCl cannot be calculated (Unknown ideal weight.). Liver Function Tests: No results for input(s): AST, ALT, ALKPHOS, BILITOT, PROT, ALBUMIN in the last 168 hours. No results for input(s): LIPASE, AMYLASE in the last 168 hours. No results for input(s): AMMONIA in the last 168 hours. Coagulation Profile: No results for input(s): INR, PROTIME in the last 168 hours. Cardiac Enzymes: No results for input(s): CKTOTAL, CKMB, CKMBINDEX, TROPONINI in the last 168 hours. BNP (last 3 results) No results for input(s): PROBNP in the last 8760 hours. HbA1C: No results for input(s): HGBA1C in the last 72 hours. CBG: No results for input(s): GLUCAP in the last 168 hours. Lipid Profile: No results for input(s): CHOL, HDL, LDLCALC, TRIG, CHOLHDL, LDLDIRECT in the last 72 hours. Thyroid Function Tests: No results for input(s): TSH, T4TOTAL, FREET4, T3FREE, THYROIDAB in the last 72 hours. Anemia Panel: No results for input(s): VITAMINB12, FOLATE, FERRITIN, TIBC, IRON, RETICCTPCT in the last 72 hours. Urine analysis:    Component Value  Date/Time   COLORURINE YELLOW 07/17/2015 0018   APPEARANCEUR CLEAR 07/17/2015 0018   LABSPEC 1.011 07/17/2015 0018   PHURINE 7.5 07/17/2015 0018   GLUCOSEU NEGATIVE 07/17/2015 0018   HGBUR NEGATIVE 07/17/2015 0018   BILIRUBINUR SMALL* 07/17/2015 0018   KETONESUR 40* 07/17/2015 0018   PROTEINUR NEGATIVE 07/17/2015 0018   UROBILINOGEN 1.0 08/02/2012 1255   NITRITE NEGATIVE 07/17/2015 0018   LEUKOCYTESUR NEGATIVE 07/17/2015 0018    Creatinine Clearance: CrCl cannot be calculated (Unknown ideal weight.).  Sepsis Labs: (procalcitonin:4,lacticidven:4) )No results found for this or any previous visit (from the past 240 hour(s)).   Radiological Exams on Admission: Dg Chest Portable 1 View  11/03/2015  CLINICAL DATA:  Shortness of breath since 3 a.m.  EXAM: PORTABLE CHEST 1 VIEW COMPARISON:  07/17/2015 FINDINGS: The heart size and mediastinal contours are within normal limits. Both lungs are clear. The visualized skeletal structures are unremarkable. IMPRESSION: No active disease. Electronically Signed   By: Charlett Nose M.D.   On: 11/03/2015 09:52    EKG: Not done  Assessment/Plan Principal Problem:   Asthma exacerbation    Patient with acute onset of asthma exacerbation this AM, non-exertional at onset.  Patient denies tobacco but his room reeks of tobacco smoke - he claims he visited with his mother yesterday and she is a smoker.  Also, his WOB is somewhat incongruent with his normal O2 sats and fairly good air movement.  Will admit to observation status; continue PO prednisone; continue q4 standing albuterol nebs and prn q2h nebs; and monitor overnight.  There is no indication for antibiotics at this time - despite WBC count of 18.5, there is no other evidence of infection.   DVT prophylaxis:  Lovenox Code Status:  Full - confirmed with patient Family Communication: none present  Disposition Plan:  Home once clinically improved Consults called: None  Admission status:  Observation    [name] MD [facility]  If 7PM-7AM, please contact night-coverage [email or web address] Password [facility]  11/03/2015, 5:40 PM

## 2015-11-03 NOTE — Progress Notes (Signed)
[facility] for admission to [phone]

## 2015-11-03 NOTE — Progress Notes (Signed)
RT removed patient from CAT per MD request. No complications. Vital signs stable at this time. RT will continue to monitor.

## 2015-11-03 NOTE — Progress Notes (Signed)
Devin [name] is a 35 y.o. male patient admitted from ED awake, alert - oriented  X 4 - no acute distress noted.  VSS - Blood pressure 135/72, pulse 97, temperature 98.1 F (36.7 [name], temperature source Oral, resp. rate 20, SpO2 97 %.    IV in place, occlusive dsg intact without redness.  Orientation to room, and floor completed with information packet given to patient/family.  Patient declined safety video at this time.  Admission INP armband ID verified with patient/family, and in place.   SR up x 2, fall assessment complete, with patient and family able to verbalize understanding of risk associated with falls, and verbalized understanding to call nsg before up out of bed.  Call light within reach, patient able to voice, and demonstrate understanding.  Skin, clean-dry- intact without evidence of bruising, or skin tears.   No evidence of skin break down noted on exam.  Continuous pulse ox ordered from Portable Equipment      Will cont to eval and treat per MD orders.  Devin [name], Devin Shurtz C, RN 11/03/2015 6:07 PM

## 2015-11-03 NOTE — ED Notes (Signed)
Pt from home via [facility] with c/o an asthma exacerbation which usually occurs during the summer time.  Pt reports taking 2 duo nebs prior to EMS arrival with no relief.  EMS gave an additional duo neb.  12 lead unremarkable with occasional PVCs.  Audible expiratory wheezes.  NAD, A&O.

## 2015-11-03 NOTE — ED Provider Notes (Signed)
CSN: [id number]     Arrival date & time 11/03/15  0907 History   First MD Initiated Contact with Patient 11/03/15 0911     Chief Complaint  Patient presents with  . Asthma      HPI  Expand All Collapse All   Pt from home via [facility] with c/o an asthma exacerbation which usually occurs during the summer time. Pt reports taking 2 duo nebs prior to EMS arrival with no relief. EMS gave an additional duo neb        Past Medical History  Diagnosis Date  . Asthma   . ADHD (attention deficit hyperactivity disorder)   . Transaminitis 07/17/2015  . GERD (gastroesophageal reflux disease)    Past Surgical History  Procedure Laterality Date  . Tympanoplasty Left 1997   Family History  Problem Relation Age of Onset  . Heart failure Mother   . Hyperlipidemia Mother   . Hypertension Mother   . Migraines Mother   . COPD Mother   . Coronary artery disease Father   . Heart attack Father   . Heart disease Father   . Diabetes Maternal Aunt   . Asthma Maternal Grandfather   . Asthma Paternal Grandfather    Social History  Substance Use Topics  . Smoking status: Former Smoker -- 0.25 packs/day for 16 years    Types: Cigarettes    Quit date: 06/21/2014  . Smokeless tobacco: Never Used  . Alcohol Use: 0.0 oz/week    0 Standard drinks or equivalent per week     Comment: 7/14//2017 "I'll drink 2-3 beers watching football/social events"    Review of Systems  All other systems reviewed and are negative  Allergies  Amoxicillin-pot clavulanate and Other  Home Medications   Prior to Admission medications   Medication Sig Start Date End Date Taking? Authorizing Provider  albuterol (PROVENTIL) (2.5 MG/3ML) 0.083% nebulizer solution Take 3 mLs (2.5 mg total) by nebulization every 6 (six) hours as needed for wheezing or shortness of breath. 07/20/15  Yes Jeralyn BennettEzequiel Zamora, MD  ibuprofen (ADVIL,MOTRIN) 400 MG tablet Take 400 mg by mouth every 6 (six) hours as needed.   Yes Historical Provider,  MD  albuterol (PROVENTIL HFA;VENTOLIN HFA) 108 (90 Base) MCG/ACT inhaler Inhale 2 puffs into the lungs every 6 (six) hours as needed for wheezing or shortness of breath. Patient not taking: Reported on 11/03/2015 07/20/15   Jeralyn BennettEzequiel Zamora, MD   BP 140/71 mmHg  Pulse 92  Temp(Src) 98.3 F (36.8 C) (Oral)  Resp 18  SpO2 98% Physical Exam  Constitutional: He is oriented to person, place, and time. He appears well-developed and well-nourished. No distress.  HENT:  Head: Normocephalic and atraumatic.  Eyes: Pupils are equal, round, and reactive to light.  Neck: Normal range of motion.  Cardiovascular: Normal rate and intact distal pulses.   Pulmonary/Chest: He is in respiratory distress (Mild). He has wheezes.  Abdominal: Normal appearance. He exhibits no distension.  Musculoskeletal: Normal range of motion.  Neurological: He is alert and oriented to person, place, and time. No cranial nerve deficit.  Skin: Skin is warm and dry. No rash noted.  Psychiatric: He has a normal mood and affect. His behavior is normal.  Nursing note and vitals reviewed.   ED Course  Procedures (including critical care time) Medications  enoxaparin (LOVENOX) injection 40 mg (40 mg Subcutaneous Not Given 11/04/15 1637)  acetaminophen (TYLENOL) tablet 650 mg (650 mg Oral Given 11/04/15 2027)    Or  acetaminophen (TYLENOL) suppository  650 mg ( Rectal See Alternative 11/04/15 2027)  zolpidem (AMBIEN) tablet 5 mg (5 mg Oral Given 11/03/15 2125)  ondansetron (ZOFRAN) tablet 4 mg (not administered)    Or  ondansetron (ZOFRAN) injection 4 mg (not administered)  budesonide (PULMICORT) nebulizer solution 0.25 mg (0.25 mg Nebulization Given 11/04/15 1944)  arformoterol (BROVANA) nebulizer solution 15 mcg (15 mcg Nebulization Given 11/04/15 1944)  methylPREDNISolone sodium succinate (SOLU-MEDROL) 125 mg/2 mL injection 80 mg (80 mg Intravenous Given 11/04/15 2143)  sodium chloride 0.9 % 1,000 mL infusion ( Intravenous New  Bag/Given 11/04/15 2144)  guaiFENesin (MUCINEX) 12 hr tablet 1,200 mg (1,200 mg Oral Given 11/04/15 2143)  levofloxacin (LEVAQUIN) IVPB 750 mg (750 mg Intravenous Given 11/04/15 1018)  ipratropium-albuterol (DUONEB) 0.5-2.5 (3) MG/3ML nebulizer solution 3 mL (3 mLs Nebulization Given 11/04/15 1944)  ipratropium-albuterol (DUONEB) 0.5-2.5 (3) MG/3ML nebulizer solution 3 mL (3 mLs Nebulization Given 11/04/15 2317)  pantoprazole (PROTONIX) EC tablet 40 mg (not administered)  gi cocktail (Maalox,Lidocaine,Donnatal) (30 mLs Oral Given 11/04/15 1626)  methylPREDNISolone sodium succinate (SOLU-MEDROL) 125 mg/2 mL injection 125 mg (125 mg Intravenous Given 11/03/15 0923)  albuterol (PROVENTIL) (2.5 MG/3ML) 0.083% nebulizer solution 5 mg (5 mg Nebulization Given 11/03/15 1140)  albuterol (PROVENTIL) (2.5 MG/3ML) 0.083% nebulizer solution 5 mg (5 mg Nebulization Given 11/03/15 1558)  methylPREDNISolone sodium succinate (SOLU-MEDROL) 125 mg/2 mL injection 125 mg (125 mg Intravenous Given 11/03/15 1602)  magnesium sulfate IVPB 2 g 50 mL (2 g Intravenous New Bag/Given 11/03/15 1638)  LORazepam (ATIVAN) injection 1 mg (1 mg Intravenous Given 11/03/15 1754)  LORazepam (ATIVAN) tablet 0.5 mg (0.5 mg Oral Given 11/04/15 2143)    Labs Review Labs Reviewed  BASIC METABOLIC PANEL - Abnormal; Notable for the following:    CO2 21 (*)    Glucose, Bld 127 (*)    All other components within normal limits  CBC WITH DIFFERENTIAL/PLATELET - Abnormal; Notable for the following:    WBC 18.5 (*)    Neutro Abs 17.7 (*)    Lymphs Abs 0.6 (*)    All other components within normal limits  BASIC METABOLIC PANEL - Abnormal; Notable for the following:    Glucose, Bld 122 (*)    All other components within normal limits  CBC - Abnormal; Notable for the following:    WBC 18.5 (*)    Platelets 410 (*)    All other components within normal limits  CULTURE, BLOOD (ROUTINE X 2)  CULTURE, BLOOD (ROUTINE X 2)  CULTURE, EXPECTORATED  SPUTUM-ASSESSMENT  GRAM STAIN  URINE CULTURE  STREP PNEUMONIAE URINARY ANTIGEN  URINALYSIS, ROUTINE W REFLEX MICROSCOPIC (NOT AT [facility])  HIV ANTIBODY (ROUTINE TESTING)  LEGIONELLA PNEUMOPHILA SEROGP 1 UR AG    Imaging Review Dg Chest Portable 1 View  11/03/2015  CLINICAL DATA:  Shortness of breath since 3 a.m. EXAM: PORTABLE CHEST 1 VIEW COMPARISON:  07/17/2015 FINDINGS: The heart size and mediastinal contours are within normal limits. Both lungs are clear. The visualized skeletal structures are unremarkable. IMPRESSION: No active disease. Electronically Signed   By: Charlett Nose M.D.   On: 11/03/2015 09:52   I have personally reviewed and evaluated these images and lab results as part of my medical decision-making.    MDM   Final diagnoses:  Hypoxia        Nelva Nay, MD 11/05/15 [phone]

## 2015-11-03 NOTE — ED Notes (Signed)
Attempted to call report

## 2015-11-03 NOTE — ED Notes (Signed)
Pt c/o continued "tightness" and some increased WOB after ambulation. Same reported to Dr. Radford PaxBeaton.

## 2015-11-03 NOTE — ED Notes (Signed)
Pt taking po fluids and tolerating well. 

## 2015-11-03 NOTE — Progress Notes (Addendum)
Patient still complaining of "tighness in chest" after albuterol treatment. Patient stated that Symbicort usually helps. MD paged to make aware. MD verbal order for symbicort 80mg /4.5 BID 2 puffs. RN could not place order r/t formulary change and MD verbal okay for pharmacy to place.

## 2015-11-04 DIAGNOSIS — J45901 Unspecified asthma with (acute) exacerbation: Secondary | ICD-10-CM

## 2015-11-04 DIAGNOSIS — D72829 Elevated white blood cell count, unspecified: Secondary | ICD-10-CM | POA: Diagnosis present

## 2015-11-04 DIAGNOSIS — F909 Attention-deficit hyperactivity disorder, unspecified type: Secondary | ICD-10-CM

## 2015-11-04 LAB — CBC
HEMATOCRIT: 45.8 % (ref 39.0–52.0)
Hemoglobin: 15.5 g/dL (ref 13.0–17.0)
MCH: 30.4 pg (ref 26.0–34.0)
MCHC: 33.8 g/dL (ref 30.0–36.0)
MCV: 89.8 fL (ref 78.0–100.0)
PLATELETS: 410 10*3/uL — AB (ref 150–400)
RBC: 5.1 MIL/uL (ref 4.22–5.81)
RDW: 13.2 % (ref 11.5–15.5)
WBC: 18.5 10*3/uL — AB (ref 4.0–10.5)

## 2015-11-04 LAB — URINALYSIS, ROUTINE W REFLEX MICROSCOPIC
BILIRUBIN URINE: NEGATIVE
GLUCOSE, UA: NEGATIVE mg/dL
HGB URINE DIPSTICK: NEGATIVE
KETONES UR: NEGATIVE mg/dL
Leukocytes, UA: NEGATIVE
Nitrite: NEGATIVE
PROTEIN: NEGATIVE mg/dL
Specific Gravity, Urine: 1.023 (ref 1.005–1.030)
pH: 6 (ref 5.0–8.0)

## 2015-11-04 LAB — BASIC METABOLIC PANEL
Anion gap: 11 (ref 5–15)
BUN: 11 mg/dL (ref 6–20)
CHLORIDE: 107 mmol/L (ref 101–111)
CO2: 23 mmol/L (ref 22–32)
CREATININE: 0.92 mg/dL (ref 0.61–1.24)
Calcium: 9.4 mg/dL (ref 8.9–10.3)
Glucose, Bld: 122 mg/dL — ABNORMAL HIGH (ref 65–99)
POTASSIUM: 3.8 mmol/L (ref 3.5–5.1)
SODIUM: 141 mmol/L (ref 135–145)

## 2015-11-04 LAB — STREP PNEUMONIAE URINARY ANTIGEN: STREP PNEUMO URINARY ANTIGEN: NEGATIVE

## 2015-11-04 MED ORDER — ALBUTEROL SULFATE (2.5 MG/3ML) 0.083% IN NEBU: 2.5000 mg | INHALATION_SOLUTION | Freq: Four times a day (QID) | RESPIRATORY_TRACT | Status: DC

## 2015-11-04 MED ORDER — BUDESONIDE 0.25 MG/2ML IN SUSP
0.2500 mg | Freq: Two times a day (BID) | RESPIRATORY_TRACT | Status: DC
  Administered 2015-11-04 – 2015-11-06 (×4): 0.25 mg via RESPIRATORY_TRACT
  Filled 2015-11-04 (×4): qty 2

## 2015-11-04 MED ORDER — PANTOPRAZOLE SODIUM 40 MG PO TBEC
40.0000 mg | DELAYED_RELEASE_TABLET | Freq: Every day | ORAL | Status: DC
  Administered 2015-11-05 – 2015-11-06 (×2): 40 mg via ORAL
  Filled 2015-11-04 (×2): qty 1

## 2015-11-04 MED ORDER — GUAIFENESIN ER 600 MG PO TB12
1200.0000 mg | ORAL_TABLET | Freq: Two times a day (BID) | ORAL | Status: DC
  Administered 2015-11-04 – 2015-11-06 (×5): 1200 mg via ORAL
  Filled 2015-11-04 (×5): qty 2

## 2015-11-04 MED ORDER — ARFORMOTEROL TARTRATE 15 MCG/2ML IN NEBU
15.0000 ug | INHALATION_SOLUTION | Freq: Two times a day (BID) | RESPIRATORY_TRACT | Status: DC
  Administered 2015-11-04 – 2015-11-06 (×5): 15 ug via RESPIRATORY_TRACT
  Filled 2015-11-04 (×5): qty 2

## 2015-11-04 MED ORDER — LORAZEPAM 0.5 MG PO TABS
0.5000 mg | ORAL_TABLET | Freq: Once | ORAL | Status: AC
  Administered 2015-11-04: 0.5 mg via ORAL
  Filled 2015-11-04: qty 1

## 2015-11-04 MED ORDER — METHYLPREDNISOLONE SODIUM SUCC 125 MG IJ SOLR
80.0000 mg | Freq: Three times a day (TID) | INTRAMUSCULAR | Status: DC
  Administered 2015-11-04 – 2015-11-05 (×4): 80 mg via INTRAVENOUS
  Filled 2015-11-04 (×4): qty 2

## 2015-11-04 MED ORDER — IPRATROPIUM-ALBUTEROL 0.5-2.5 (3) MG/3ML IN SOLN
3.0000 mL | Freq: Four times a day (QID) | RESPIRATORY_TRACT | Status: DC
  Administered 2015-11-04 – 2015-11-06 (×8): 3 mL via RESPIRATORY_TRACT
  Filled 2015-11-04 (×8): qty 3

## 2015-11-04 MED ORDER — GI COCKTAIL ~~LOC~~
30.0000 mL | Freq: Three times a day (TID) | ORAL | Status: DC | PRN
  Administered 2015-11-04: 30 mL via ORAL
  Filled 2015-11-04: qty 30

## 2015-11-04 MED ORDER — IPRATROPIUM-ALBUTEROL 0.5-2.5 (3) MG/3ML IN SOLN
3.0000 mL | RESPIRATORY_TRACT | Status: DC | PRN
  Administered 2015-11-04 – 2015-11-05 (×5): 3 mL via RESPIRATORY_TRACT
  Filled 2015-11-04 (×5): qty 3

## 2015-11-04 MED ORDER — LEVOFLOXACIN IN D5W 750 MG/150ML IV SOLN
750.0000 mg | INTRAVENOUS | Status: DC
  Administered 2015-11-04 – 2015-11-05 (×2): 750 mg via INTRAVENOUS
  Filled 2015-11-04 (×2): qty 150

## 2015-11-04 MED ORDER — SODIUM CHLORIDE 0.9 % IV SOLN
INTRAVENOUS | Status: AC
  Administered 2015-11-04 – 2015-11-06 (×4): via INTRAVENOUS

## 2015-11-04 NOTE — Progress Notes (Signed)
Pharmacy may adjust abx dose based on pt's renal function. Creat 0.92 Levaquin 750 IV q24 dose appropriate. Pharmacy to sign off Thanks Herby AbrahamMichelle T. Brytney Somes, Pharm.D. [phone] 11/04/2015 8:24 AM

## 2015-11-04 NOTE — Progress Notes (Addendum)
Notified Kyazimova, PA that pt states his nerves are bad today and his hands are shaking. Pt requesting Ativan. Pt denies being a drinker. PA ordered Ativan pox1. Will continue to monitor. Nelda MarseilleJenny Thacker, RN

## 2015-11-04 NOTE — Progress Notes (Signed)
PROGRESS NOTE    Devin KillingsMark Wemhoff  GNF:[id number] DOB: 02/21/1981 DOA: 11/03/2015 PCP: No PCP Per Patient    Brief Narrative:  Devin [name] is a 35 y.o. male with medical history significant of asthma presenting with acute respiratory failure. Patient awoke about 0250 this AM with coughing. Got a little better but then it recurred about 0800. Unsure what brought it on, was just lying in the bed. No sick contacts. No PCP, comes to ER when this happens. Had 2 Duonebs and 1 albuterol neb without relief. +SOB, wheezing, tight chest. Occasional cold sweats but no fevers. No URI symptoms.   3 prior overnight hospitalizations. Diagnosed with asthma at age 89yo. No intubations. No controlling meds - always sent home with Symbicort but can't afford it and so stops taking it ("it's a wonderful drug, it works wonders"). [facility] and has been [facility]. Last exacerbation was in June, not this bad. Symptoms tend to be worse in heat of summer. Cough was so bad this AM he was dry heaving.  Assessment & Plan:   Principal Problem:   Asthma exacerbation Active Problems:   ADHD (attention deficit hyperactivity disorder)   Leukocytosis  #1 asthma exacerbation versus COPD exacerbation versus commonly acquired pneumonia Patient presented with shortness of breath, chest tightness, wheezing, cough. Patient also smells like tobacco although he denies any ongoing tobacco abuse. Patient with some clinical improvement. Will check a urine Legionella antigen, urine pneumococcus antigen, sputum Gram stain and culture. We will change nebulizer treatments to do a nebs scheduled as well as when necessary. Will place empirically on Pulmicort and Brovana, change oral prednisone to IV steroids, continue oxygen, supportive care. Will place patient on IV Levaquin. Will repeat chest x-ray in the morning to see for pneumonia fluffs out.  #2 leukocytosis Questionable etiology. Chest x-ray  on admission was unremarkable. Check blood cultures 2. Check a sputum Gram stain and culture. Will check a UA with cultures and sensitivities. Will place patient empirically on IV Levaquin.  #3 ADHD Stable.   DVT prophylaxis: Lovenox Code Status: Full Family Communication: Updated patient. No family at bedside. Disposition Plan: Home when medically stable.   Consultants:   None  Procedures:   Chest x-ray 11/03/2015  Antimicrobials:  IV Levaquin 11/04/2015   Subjective: Patient states some improvement with wheezing and shortness of breath. Patient states chest tightness is not as often. Patient denies any ongoing tobacco abuse. Patient states prior history of tobacco use was very minimal.  Objective: Filed Vitals:   11/04/15 0725 11/04/15 1052 11/04/15 1256 11/04/15 1500  BP:   121/69   Pulse:   100   Temp:   98.7 F (37.1 C)   TempSrc:      Resp:   18   SpO2: 96% 98% 96% 98%    Intake/Output Summary (Last 24 hours) at 11/04/15 1743 Last data filed at 11/04/15 1322  Gross per 24 hour  Intake    770 ml  Output      0 ml  Net    770 ml   There were no vitals filed for this visit.  Examination:  General exam: Appears calm and comfortable  Respiratory system: Minimal expiratory wheezing. Poor to fair air movement. No crackles. Speaking in full sentences.  Cardiovascular system: S1 & S2 heard, RRR. No JVD, murmurs, rubs, gallops or clicks. No pedal edema. Gastrointestinal system: Abdomen is nondistended, soft and nontender. No organomegaly or masses felt. Normal bowel sounds heard. Central nervous system:  Alert and oriented. No focal neurological deficits. Extremities: Symmetric 5 x 5 power. Skin: No rashes, lesions or ulcers Psychiatry: Judgement and insight appear normal. Mood & affect appropriate.     Data Reviewed: I have personally reviewed following labs and imaging studies  CBC:  Recent Labs Lab 11/03/15 1410 11/04/15 0611  WBC 18.5* 18.5*    NEUTROABS 17.7*  --   HGB 16.1 15.5  HCT 46.1 45.8  MCV 88.5 89.8  PLT 386 410*   Basic Metabolic Panel:  Recent Labs Lab 11/03/15 1410 11/04/15 0611  NA 137 141  K 3.9 3.8  CL 107 107  CO2 21* 23  GLUCOSE 127* 122*  BUN 11 11  CREATININE 0.99 0.92  CALCIUM 9.4 9.4   GFR: CrCl cannot be calculated (Unknown ideal weight.). Liver Function Tests: No results for input(s): AST, ALT, ALKPHOS, BILITOT, PROT, ALBUMIN in the last 168 hours. No results for input(s): LIPASE, AMYLASE in the last 168 hours. No results for input(s): AMMONIA in the last 168 hours. Coagulation Profile: No results for input(s): INR, PROTIME in the last 168 hours. Cardiac Enzymes: No results for input(s): CKTOTAL, CKMB, CKMBINDEX, TROPONINI in the last 168 hours. BNP (last 3 results) No results for input(s): PROBNP in the last 8760 hours. HbA1C: No results for input(s): HGBA1C in the last 72 hours. CBG: No results for input(s): GLUCAP in the last 168 hours. Lipid Profile: No results for input(s): CHOL, HDL, LDLCALC, TRIG, CHOLHDL, LDLDIRECT in the last 72 hours. Thyroid Function Tests: No results for input(s): TSH, T4TOTAL, FREET4, T3FREE, THYROIDAB in the last 72 hours. Anemia Panel: No results for input(s): VITAMINB12, FOLATE, FERRITIN, TIBC, IRON, RETICCTPCT in the last 72 hours. Sepsis Labs: No results for input(s): PROCALCITON, LATICACIDVEN in the last 168 hours.  No results found for this or any previous visit (from the past 240 hour(s)).       Radiology Studies: Dg Chest Portable 1 View  11/03/2015  CLINICAL DATA:  Shortness of breath since 3 a.m. EXAM: PORTABLE CHEST 1 VIEW COMPARISON:  07/17/2015 FINDINGS: The heart size and mediastinal contours are within normal limits. Both lungs are clear. The visualized skeletal structures are unremarkable. IMPRESSION: No active disease. Electronically Signed   By: Charlett Nose M.D.   On: 11/03/2015 09:52        Scheduled Meds: .  arformoterol  15 mcg Nebulization BID  . budesonide (PULMICORT) nebulizer solution  0.25 mg Nebulization BID  . enoxaparin (LOVENOX) injection  40 mg Subcutaneous Q24H  . guaiFENesin  1,200 mg Oral BID  . ipratropium-albuterol  3 mL Nebulization Q6H  . levofloxacin (LEVAQUIN) IV  750 mg Intravenous Q24H  . methylPREDNISolone (SOLU-MEDROL) injection  80 mg Intravenous Q8H  . [START ON 11/05/2015] pantoprazole  40 mg Oral Q0600   Continuous Infusions: . sodium chloride 0.9 % 1,000 mL infusion 125 mL/hr at 11/04/15 0858        Time spent: 35 minutes    THOMPSON,DANIEL, MD [facility] Pager [phone]  If 7PM-7AM, please contact night-coverage [email or web address] Password [facility] 11/04/2015, 5:43 PM

## 2015-11-05 ENCOUNTER — Inpatient Hospital Stay (HOSPITAL_COMMUNITY): Payer: MEDICAID

## 2015-11-05 DIAGNOSIS — R0902 Hypoxemia: Secondary | ICD-10-CM | POA: Insufficient documentation

## 2015-11-05 DIAGNOSIS — J441 Chronic obstructive pulmonary disease with (acute) exacerbation: Principal | ICD-10-CM | POA: Insufficient documentation

## 2015-11-05 LAB — HIV ANTIBODY (ROUTINE TESTING W REFLEX): HIV SCREEN 4TH GENERATION: NONREACTIVE

## 2015-11-05 LAB — URINE CULTURE: Culture: NO GROWTH

## 2015-11-05 MED ORDER — METHYLPREDNISOLONE SODIUM SUCC 125 MG IJ SOLR
60.0000 mg | Freq: Two times a day (BID) | INTRAMUSCULAR | Status: DC
  Administered 2015-11-05 – 2015-11-06 (×2): 60 mg via INTRAVENOUS
  Filled 2015-11-05 (×2): qty 2

## 2015-11-05 MED ORDER — LEVOFLOXACIN 500 MG PO TABS
500.0000 mg | ORAL_TABLET | Freq: Every day | ORAL | Status: DC
  Administered 2015-11-06: 500 mg via ORAL
  Filled 2015-11-05: qty 1

## 2015-11-05 NOTE — Progress Notes (Signed)
PROGRESS NOTE    Devin [name]  WUJ:[id number] DOB: 10/27/1980 DOA: 11/03/2015 PCP: No PCP Per Patient    Brief Narrative:  Devin [name] is a 35 y.o. male with medical history significant of asthma presenting with acute respiratory failure. Patient awoke about 0250 this AM with coughing. Got a little better but then it recurred about 0800. Unsure what brought it on, was just lying in the bed. No sick contacts. No PCP, comes to ER when this happens. Had 2 Duonebs and 1 albuterol neb without relief. +SOB, wheezing, tight chest. Occasional cold sweats but no fevers. No URI symptoms.   3 prior overnight hospitalizations. Diagnosed with asthma at age 89yo. No intubations. No controlling meds - always sent home with Symbicort but can't afford it and so stops taking it ("it's a wonderful drug, it works wonders"). [facility] and has been unable. Last exacerbation was in June, not this bad. Symptoms tend to be worse in heat of summer. Cough was so bad this AM he was dry heaving.  Assessment & Plan:   Principal Problem:   Asthma exacerbation Active Problems:   ADHD (attention deficit hyperactivity disorder)   Leukocytosis  #1 asthma exacerbation versus COPD exacerbation Patient presented with shortness of breath, chest tightness, wheezing, cough. Patient also smells like tobacco although he denies any ongoing tobacco abuse. Patient with some clinical improvement. Urine Legionella antigen pending, urine pneumococcus antigen negative, sputum Gram stain and culture pending. Repeat chest x-ray negative for any acute infiltrate. Continue current nebulizer treatments. Transition IV Solu-Medrol to oral prednisone tomorrow morning. Change IV Levaquin to oral Levaquin. Follow.   #2 leukocytosis Questionable etiology. Chest x-ray on admission was unremarkable. Blood cultures 2 pending. Sputum Gram stain and culture pending. Urine culture negative. Continue  antibiotics.  #3 ADHD Stable.   DVT prophylaxis: Lovenox Code Status: Full Family Communication: Updated patient. No family at bedside. Disposition Plan: Home when medically stable, hopefully tomorrow.   Consultants:   None  Procedures:   Chest x-ray 11/03/2015, 11/05/2015  Antimicrobials:  IV Levaquin 11/04/2015>>>>oral levaquin 11/05/2015   Subjective: Patient states SOB and wheezing improving.   Objective: Filed Vitals:   11/05/15 0201 11/05/15 0503 11/05/15 0558 11/05/15 0838  BP:  109/59    Pulse:  83    Temp:  98.5 F (36.9 C)    TempSrc:  Oral    Resp:  18    SpO2: 97% 94% 97% 98%    Intake/Output Summary (Last 24 hours) at 11/05/15 1135 Last data filed at 11/05/15 0920  Gross per 24 hour  Intake 1697.08 ml  Output    300 ml  Net 1397.08 ml   There were no vitals filed for this visit.  Examination:  General exam: Appears calm and comfortable  Respiratory system: Minimal expiratory wheezing. Fair air movement. No crackles. Speaking in full sentences.  Cardiovascular system: S1 & S2 heard, RRR. No JVD, murmurs, rubs, gallops or clicks. No pedal edema. Gastrointestinal system: Abdomen is nondistended, soft and nontender. No organomegaly or masses felt. Normal bowel sounds heard. Central nervous system: Alert and oriented. No focal neurological deficits. Extremities: Symmetric 5 x 5 power. Skin: No rashes, lesions or ulcers Psychiatry: Judgement and insight appear normal. Mood & affect appropriate.     Data Reviewed: I have personally reviewed following labs and imaging studies  CBC:  Recent Labs Lab 11/03/15 1410 11/04/15 0611  WBC 18.5* 18.5*  NEUTROABS 17.7*  --   HGB 16.1 15.5  HCT  46.1 45.8  MCV 88.5 89.8  PLT 386 410*   Basic Metabolic Panel:  Recent Labs Lab 11/03/15 1410 11/04/15 0611  NA 137 141  K 3.9 3.8  CL 107 107  CO2 21* 23  GLUCOSE 127* 122*  BUN 11 11  CREATININE 0.99 0.92  CALCIUM 9.4 9.4   GFR: CrCl  cannot be calculated (Unknown ideal weight.). Liver Function Tests: No results for input(s): AST, ALT, ALKPHOS, BILITOT, PROT, ALBUMIN in the last 168 hours. No results for input(s): LIPASE, AMYLASE in the last 168 hours. No results for input(s): AMMONIA in the last 168 hours. Coagulation Profile: No results for input(s): INR, PROTIME in the last 168 hours. Cardiac Enzymes: No results for input(s): CKTOTAL, CKMB, CKMBINDEX, TROPONINI in the last 168 hours. BNP (last 3 results) No results for input(s): PROBNP in the last 8760 hours. HbA1C: No results for input(s): HGBA1C in the last 72 hours. CBG: No results for input(s): GLUCAP in the last 168 hours. Lipid Profile: No results for input(s): CHOL, HDL, LDLCALC, TRIG, CHOLHDL, LDLDIRECT in the last 72 hours. Thyroid Function Tests: No results for input(s): TSH, T4TOTAL, FREET4, T3FREE, THYROIDAB in the last 72 hours. Anemia Panel: No results for input(s): VITAMINB12, FOLATE, FERRITIN, TIBC, IRON, RETICCTPCT in the last 72 hours. Sepsis Labs: No results for input(s): PROCALCITON, LATICACIDVEN in the last 168 hours.  No results found for this or any previous visit (from the past 240 hour(s)).       Radiology Studies: Dg Chest 2 View  11/05/2015  CLINICAL DATA:  Hypoxia. Patient denies any chest complaints this morning EXAM: CHEST  2 VIEW COMPARISON:  11/03/2015 FINDINGS: The heart size and mediastinal contours are within normal limits. Both lungs are clear. The visualized skeletal structures are unremarkable. IMPRESSION: No active cardiopulmonary disease. Electronically Signed   By: Norva Pavlov M.D.   On: 11/05/2015 11:24        Scheduled Meds: . arformoterol  15 mcg Nebulization BID  . budesonide (PULMICORT) nebulizer solution  0.25 mg Nebulization BID  . enoxaparin (LOVENOX) injection  40 mg Subcutaneous Q24H  . guaiFENesin  1,200 mg Oral BID  . ipratropium-albuterol  3 mL Nebulization Q6H  . levofloxacin (LEVAQUIN) IV   750 mg Intravenous Q24H  . methylPREDNISolone (SOLU-MEDROL) injection  60 mg Intravenous Q12H  . pantoprazole  40 mg Oral Q0600   Continuous Infusions: . sodium chloride 0.9 % 1,000 mL infusion 125 mL/hr at 11/05/15 0512     LOS: 1 day    Time spent: 35 minutes    Der Gagliano, MD [facility] Pager [phone]  If 7PM-7AM, please contact night-coverage [email or web address] Password [email or web address] 11/05/2015, 11:35 AM

## 2015-11-06 LAB — LEGIONELLA PNEUMOPHILA SEROGP 1 UR AG: L. pneumophila Serogp 1 Ur Ag: NEGATIVE

## 2015-11-06 LAB — CBC
HEMATOCRIT: 42.2 % (ref 39.0–52.0)
HEMOGLOBIN: 13.7 g/dL (ref 13.0–17.0)
MCH: 29.9 pg (ref 26.0–34.0)
MCHC: 32.5 g/dL (ref 30.0–36.0)
MCV: 92.1 fL (ref 78.0–100.0)
Platelets: 354 10*3/uL (ref 150–400)
RBC: 4.58 MIL/uL (ref 4.22–5.81)
RDW: 13.3 % (ref 11.5–15.5)
WBC: 18.3 10*3/uL — AB (ref 4.0–10.5)

## 2015-11-06 LAB — BASIC METABOLIC PANEL
ANION GAP: 5 (ref 5–15)
BUN: 13 mg/dL (ref 6–20)
CHLORIDE: 111 mmol/L (ref 101–111)
CO2: 24 mmol/L (ref 22–32)
Calcium: 8.7 mg/dL — ABNORMAL LOW (ref 8.9–10.3)
Creatinine, Ser: 0.82 mg/dL (ref 0.61–1.24)
GFR calc Af Amer: >60 mL/min (ref >60)
GLUCOSE: 96 mg/dL (ref 65–99)
POTASSIUM: 3.6 mmol/L (ref 3.5–5.1)
Sodium: 140 mmol/L (ref 135–145)

## 2015-11-06 MED ORDER — ALBUTEROL SULFATE HFA 108 (90 BASE) MCG/ACT IN AERS: 2.0000 | INHALATION_SPRAY | Freq: Four times a day (QID) | RESPIRATORY_TRACT | Status: DC | PRN

## 2015-11-06 MED ORDER — FLUTICASONE-SALMETEROL 250-50 MCG/DOSE IN AEPB: 1.0000 | INHALATION_SPRAY | Freq: Two times a day (BID) | RESPIRATORY_TRACT | Status: DC

## 2015-11-06 MED ORDER — PREDNISONE 20 MG PO TABS: 20.0000 mg | ORAL_TABLET | Freq: Every day | ORAL | Status: DC

## 2015-11-06 MED ORDER — TIOTROPIUM BROMIDE MONOHYDRATE 18 MCG IN CAPS: 18.0000 ug | ORAL_CAPSULE | Freq: Two times a day (BID) | RESPIRATORY_TRACT | Status: DC

## 2015-11-06 MED ORDER — PREDNISONE 10 MG PO TABS: 60.0000 mg | ORAL_TABLET | Freq: Every day | ORAL | Status: DC

## 2015-11-06 MED FILL — VENTOLIN HFA 90 MCG INHALER: 108 (90 BAS | 30 days supply | Qty: 18 | Fill #0

## 2015-11-06 MED FILL — predniSONE 20 MG TABS: 20 | 9 days supply | Qty: 15 | Fill #0

## 2015-11-06 NOTE — Discharge Summary (Signed)
Physician Discharge Summary  Devin [name] ZOX:[id number] DOB: 1980/06/02 DOA: 11/03/2015  PCP: No PCP Per Patient  Admit date: 11/03/2015 Discharge date: 11/06/2015  Time spent: 65 minutes  Recommendations for Outpatient Follow-up:  1. Follow-up at [facility] as scheduled. On follow-up patient's asthma need to be reassessed. Patient may need pulmonary function studies done in the outpatient setting for further evaluation of his asthma versus COPD.   Discharge Diagnoses:  Principal Problem:   Asthma exacerbation Active Problems:   ADHD (attention deficit hyperactivity disorder)   Leukocytosis   Hypoxia   COPD exacerbation (HCC)   Discharge Condition: Stable and improved  Diet recommendation: Regular  There were no vitals filed for this visit.  History of present illness:  Per Devin [name] is a 35 y.o. male with medical history significant of asthma presenting with acute respiratory failure. Patient awoke about 0250 this AM with coughing. Got a little better but then it recurred about 0800. Unsure what brought it on, was just lying in the bed. No sick contacts. No PCP, comes to ER when this happens. Had 2 Duonebs and 1 albuterol neb without relief. +SOB, wheezing, tight chest. Occasional cold sweats but no fevers. No URI symptoms.   3 prior overnight hospitalizations. Diagnosed with asthma at age 75yo. No intubations. No controlling meds - always sent home with Symbicort but can't afford it and so stops taking it ("it's a wonderful drug, it works wonders"). Trying to get into [facility] and has been unable. Last exacerbation was in June, not this bad. Symptoms tend to be worse in heat of summer. Cough was so bad this AM he was dry heaving.    ED Course: Continuous neb for 30 minutes and 15 minutes; Solumedrol 250 mg IV, Magnesium  Hospital Course:  #1 asthma exacerbation versus COPD exacerbation Patient presented with shortness of  breath, chest tightness, wheezing, cough. Patient also smelled like tobacco although he denied any ongoing tobacco abuse. Patient was placed empirically on IV Levaquin, IV Solu-Medrol taper, nebulizer treatments. Repeat chest x-ray was done which was negative. Urine Legionella was obtained which was pending at time of discharge. Urine pneumococcus antigen was negative. Sputum Gram stain and culture was pending. Patient improved clinically was transitioned to oral prednisone L be discharged on a prednisone taper, albuterol inhaler, Spiriva and Advair. Outpatient follow-up.  #2 leukocytosis Questionable etiology. Chest x-ray on admission was unremarkable. Blood cultures 2 pending with no growth to date . Sputum Gram stain and culture pending with no growth to date. Urine culture negative. Patient was initially placed on Levaquin as though some concern over COPD versus community-acquired pneumonia. Chest x-ray which was repeated was negative. Patient did not need any further antibiotics on discharge.   #3 ADHD Stable.  Procedures:  Chest x-ray 11/03/2015, 11/05/2015  Consultations:  None  Discharge Exam: Filed Vitals:   11/05/15 2201 11/06/15 0506  BP: 139/73 122/71  Pulse: 68 92  Temp: 98.5 F (36.9 C) 98.4 F (36.9 C)  Resp: 20 18    General: NAD Cardiovascular: RRR Respiratory: CTAB  Discharge Instructions   Discharge Instructions    Diet general    Complete by:  As directed      Discharge instructions    Complete by:  As directed   Follow up at [facility] as scheduled.     Increase activity slowly    Complete by:  As directed           Current Discharge Medication List  START taking these medications   Details  Fluticasone-Salmeterol (ADVAIR DISKUS) 250-50 MCG/DOSE AEPB Inhale 1 puff into the lungs 2 (two) times daily. Qty: 60 each, Refills: 0    predniSONE (DELTASONE) 20 MG tablet Take 1-3 tablets (20-60 mg total) by mouth daily before breakfast.  Take 3 tablets (60mg ) daily x 2 days, then 2 tablets (40mg ) daily x 3 days, then 1 tablet (20mg ) daily x 3 days then stop. Qty: 15 tablet, Refills: 0    tiotropium (SPIRIVA HANDIHALER) 18 MCG inhalation capsule Place 1 capsule (18 mcg total) into inhaler and inhale 2 (two) times daily. Qty: 30 capsule, Refills: 0      CONTINUE these medications which have CHANGED   Details  albuterol (PROVENTIL HFA;VENTOLIN HFA) 108 (90 Base) MCG/ACT inhaler Inhale 2 puffs into the lungs every 6 (six) hours as needed for wheezing or shortness of breath. Use 3 times daily for 4 days, then use as needed. Qty: 1 Inhaler, Refills: 2      CONTINUE these medications which have NOT CHANGED   Details  albuterol (PROVENTIL) (2.5 MG/3ML) 0.083% nebulizer solution Take 3 mLs (2.5 mg total) by nebulization every 6 (six) hours as needed for wheezing or shortness of breath. Qty: 75 mL, Refills: 12    ibuprofen (ADVIL,MOTRIN) 400 MG tablet Take 400 mg by mouth every 6 (six) hours as needed.       Allergies  Allergen Reactions  . Amoxicillin-Pot Clavulanate Hives    Tolerates Amoxicillin - Thuy 09/29/12  . Other Nausea And Vomiting    coconut   Follow-up Information    Follow up with [facility].   Why:  To get Medications after discharge   Contact information:   [address] [place] [address] [phone]      Follow up with [facility]. Go on 12/08/2015.   Specialty:  Internal Medicine   Why:  at 2:00. For your initial appointment. The rest of your appointments may be at the [facility] location.   Contact information:   [address] [place] [address] [phone]       The results of significant diagnostics from this hospitalization (including imaging, microbiology, ancillary and laboratory) are listed below for reference.    Significant Diagnostic Studies: Dg Chest 2 View  11/05/2015  CLINICAL DATA:  Hypoxia.  Patient denies any chest complaints this morning EXAM: CHEST  2 VIEW COMPARISON:  11/03/2015 FINDINGS: The heart size and mediastinal contours are within normal limits. Both lungs are clear. The visualized skeletal structures are unremarkable. IMPRESSION: No active cardiopulmonary disease. Electronically Signed   By: Norva Pavlov M.D.   On: 11/05/2015 11:24   Dg Chest Portable 1 View  11/03/2015  CLINICAL DATA:  Shortness of breath since 3 a.m. EXAM: PORTABLE CHEST 1 VIEW COMPARISON:  07/17/2015 FINDINGS: The heart size and mediastinal contours are within normal limits. Both lungs are clear. The visualized skeletal structures are unremarkable. IMPRESSION: No active disease. Electronically Signed   By: Charlett Nose M.D.   On: 11/03/2015 09:52    Microbiology: Recent Results (from the past 240 hour(s))  Culture, blood (routine x 2) Call MD if unable to obtain prior to antibiotics being given     Status: None (Preliminary result)   Collection Time: 11/04/15  9:52 AM  Result Value Ref Range Status   Specimen Description BLOOD RIGHT ANTECUBITAL  Final   Special Requests BOTTLES DRAWN AEROBIC AND ANAEROBIC 5CC  Final   Culture NO GROWTH 1 DAY  Final   Report Status PENDING  Incomplete  Culture, blood (routine x 2) Call MD if unable to obtain prior to antibiotics being given     Status: None (Preliminary result)   Collection Time: 11/04/15 10:00 AM  Result Value Ref Range Status   Specimen Description BLOOD RIGHT ANTECUBITAL  Final   Special Requests BOTTLES DRAWN AEROBIC AND ANAEROBIC 6CC  Final   Culture NO GROWTH 1 DAY  Final   Report Status PENDING  Incomplete  Urine culture     Status: None   Collection Time: 11/04/15  6:17 PM  Result Value Ref Range Status   Specimen Description URINE, CLEAN CATCH  Final   Special Requests NONE  Final   Culture NO GROWTH  Final   Report Status 11/05/2015 FINAL  Final     Labs: Basic Metabolic Panel:  Recent Labs Lab 11/03/15 1410 11/04/15 0611  11/06/15 0650  NA 137 141 140  K 3.9 3.8 3.6  CL 107 107 111  CO2 21* 23 24  GLUCOSE 127* 122* 96  BUN 11 11 13   CREATININE 0.99 0.92 0.82  CALCIUM 9.4 9.4 8.7*   Liver Function Tests: No results for input(s): AST, ALT, ALKPHOS, BILITOT, PROT, ALBUMIN in the last 168 hours. No results for input(s): LIPASE, AMYLASE in the last 168 hours. No results for input(s): AMMONIA in the last 168 hours. CBC:  Recent Labs Lab 11/03/15 1410 11/04/15 0611 11/06/15 0650  WBC 18.5* 18.5* 18.3*  NEUTROABS 17.7*  --   --   HGB 16.1 15.5 13.7  HCT 46.1 45.8 42.2  MCV 88.5 89.8 92.1  PLT 386 410* 354   Cardiac Enzymes: No results for input(s): CKTOTAL, CKMB, CKMBINDEX, TROPONINI in the last 168 hours. BNP: BNP (last 3 results) No results for input(s): BNP in the last 8760 hours.  ProBNP (last 3 results) No results for input(s): PROBNP in the last 8760 hours.  CBG: No results for input(s): GLUCAP in the last 168 hours.     SignedRamiro Harvest:  Whitney Bingaman MD.  [facility] 11/06/2015, 12:57 PM

## 2015-11-06 NOTE — Care Management Note (Signed)
Case Management Note  Patient Details  Name: Devin [name] MRN: [id number] Date of Birth: 07/10/1980  Subjective/Objective:                 Spoke to patient i the room. He states that [facility] past few [facility], [facility] the past and would like to follow there. Patient received pamphlet from [facility] and [facility]. CM explained to patient that they may use the on site pharmacy to fill prescriptions given to them at discharge. Patient aware that the [facility] will not fill narcotics or pain medications prior to the patient being seen by one of their physicians.  Patient aware that they must be seen as a patient prior to the pharmacy filling the prescriptions a second time. Patient states that he has a nebulizer at home. Patient takes the bus to appointments.  Action/Plan:  [facility] Entered in AVS.  Expected Discharge Date:                  Expected Discharge Plan:  Home/Self Care  In-House Referral:     Discharge planning Services  CM Consult, [facility]  Post Acute Care Choice:  NA Choice offered to:  NA  DME Arranged:  N/A DME Agency:  NA  HH Arranged:  NA HH Agency:  NA  Status of Service:  Completed, signed off  If discussed at Long Length of Stay Meetings, dates discussed:    Additional Comments:  Lawerance SabalDebbie Windsor Goeken, RN 11/06/2015, 11:18 AM

## 2015-11-06 NOTE — Progress Notes (Signed)
Lethea KillingsMark Mathurin to be D/C'd to home per MD order.  Discussed with the patient and all questions fully answered.  VSS, Skin clean, dry and intact without evidence of skin break down, no evidence of skin tears noted. IV catheter discontinued intact. Site without signs and symptoms of complications. Dressing and pressure applied.  An After Visit Summary was printed and given to the patient. Patient received prescriptions and bus pass.  D/c education completed with patient/family including follow up instructions, medication list, d/c activities limitations if indicated, with other d/c instructions as indicated by MD - patient able to verbalize understanding, all questions fully answered.   Patient instructed to return to ED, call 911, or call MD for any changes in condition.   Patient escorted via WC, and D/C home via public transportation.  Analis Distler L Price 11/06/2015 1:21 PM

## 2015-11-09 LAB — CULTURE, BLOOD (ROUTINE X 2)
CULTURE: NO GROWTH
Culture: NO GROWTH

## 2015-12-08 ENCOUNTER — Ambulatory Visit: Payer: Self-pay | Admitting: Family Medicine

## 2016-01-02 ENCOUNTER — Other Ambulatory Visit: Payer: Self-pay | Admitting: *Deleted

## 2016-01-02 MED ORDER — FLUTICASONE-SALMETEROL 250-50 MCG/DOSE IN AEPB: 1.0000 | INHALATION_SPRAY | Freq: Two times a day (BID) | RESPIRATORY_TRACT | 3 refills | Status: DC

## 2016-01-02 NOTE — Telephone Encounter (Signed)
PRINTED FOR PASS PROGRAM 

## 2016-01-05 ENCOUNTER — Other Ambulatory Visit: Payer: Self-pay | Admitting: *Deleted

## 2016-01-05 MED ORDER — ALBUTEROL SULFATE HFA 108 (90 BASE) MCG/ACT IN AERS: 2.0000 | INHALATION_SPRAY | Freq: Four times a day (QID) | RESPIRATORY_TRACT | 3 refills | Status: DC | PRN

## 2016-01-05 MED ORDER — TIOTROPIUM BROMIDE MONOHYDRATE 18 MCG IN CAPS: 18.0000 ug | ORAL_CAPSULE | Freq: Two times a day (BID) | RESPIRATORY_TRACT | 3 refills | Status: DC

## 2016-01-05 NOTE — Telephone Encounter (Signed)
PRINTED FOR PASS PROGRAM 

## 2016-02-05 ENCOUNTER — Emergency Department (HOSPITAL_COMMUNITY)
Admission: EM | Admit: 2016-02-05 | Discharge: 2016-02-05 | Disposition: A | Discharge status: Home / Self Care | Payer: Self-pay | Attending: Dermatology | Admitting: Dermatology

## 2016-02-05 ENCOUNTER — Encounter (HOSPITAL_COMMUNITY): Payer: Self-pay | Admitting: Emergency Medicine

## 2016-02-05 DIAGNOSIS — F909 Attention-deficit hyperactivity disorder, unspecified type: Secondary | ICD-10-CM | POA: Insufficient documentation

## 2016-02-05 DIAGNOSIS — J069 Acute upper respiratory infection, unspecified: Secondary | ICD-10-CM | POA: Insufficient documentation

## 2016-02-05 DIAGNOSIS — Z87891 Personal history of nicotine dependence: Secondary | ICD-10-CM | POA: Insufficient documentation

## 2016-02-05 MED ORDER — IPRATROPIUM-ALBUTEROL 0.5-2.5 (3) MG/3ML IN SOLN
RESPIRATORY_TRACT | Status: AC
  Administered 2016-02-05: 16:00:00
  Filled 2016-02-05: qty 3

## 2016-02-05 MED ORDER — IPRATROPIUM-ALBUTEROL 0.5-2.5 (3) MG/3ML IN SOLN
3.0000 mL | Freq: Once | RESPIRATORY_TRACT | Status: AC
  Administered 2016-02-05: 3 mL via RESPIRATORY_TRACT

## 2016-02-05 NOTE — ED Triage Notes (Signed)
Pt did not answer x 2 

## 2016-02-05 NOTE — ED Triage Notes (Signed)
Pt sts productive cough with yellow sputum and increased asthma sx; pt denies having inhaler

## 2016-04-02 IMAGING — CR DG CHEST 2V
2 series · 2 of 2 positions shown · non-contrast
Comparison: 07/07/2013

CLINICAL DATA: Shortness of breath

EXAM:
CHEST  2 VIEW

[w chest pa]
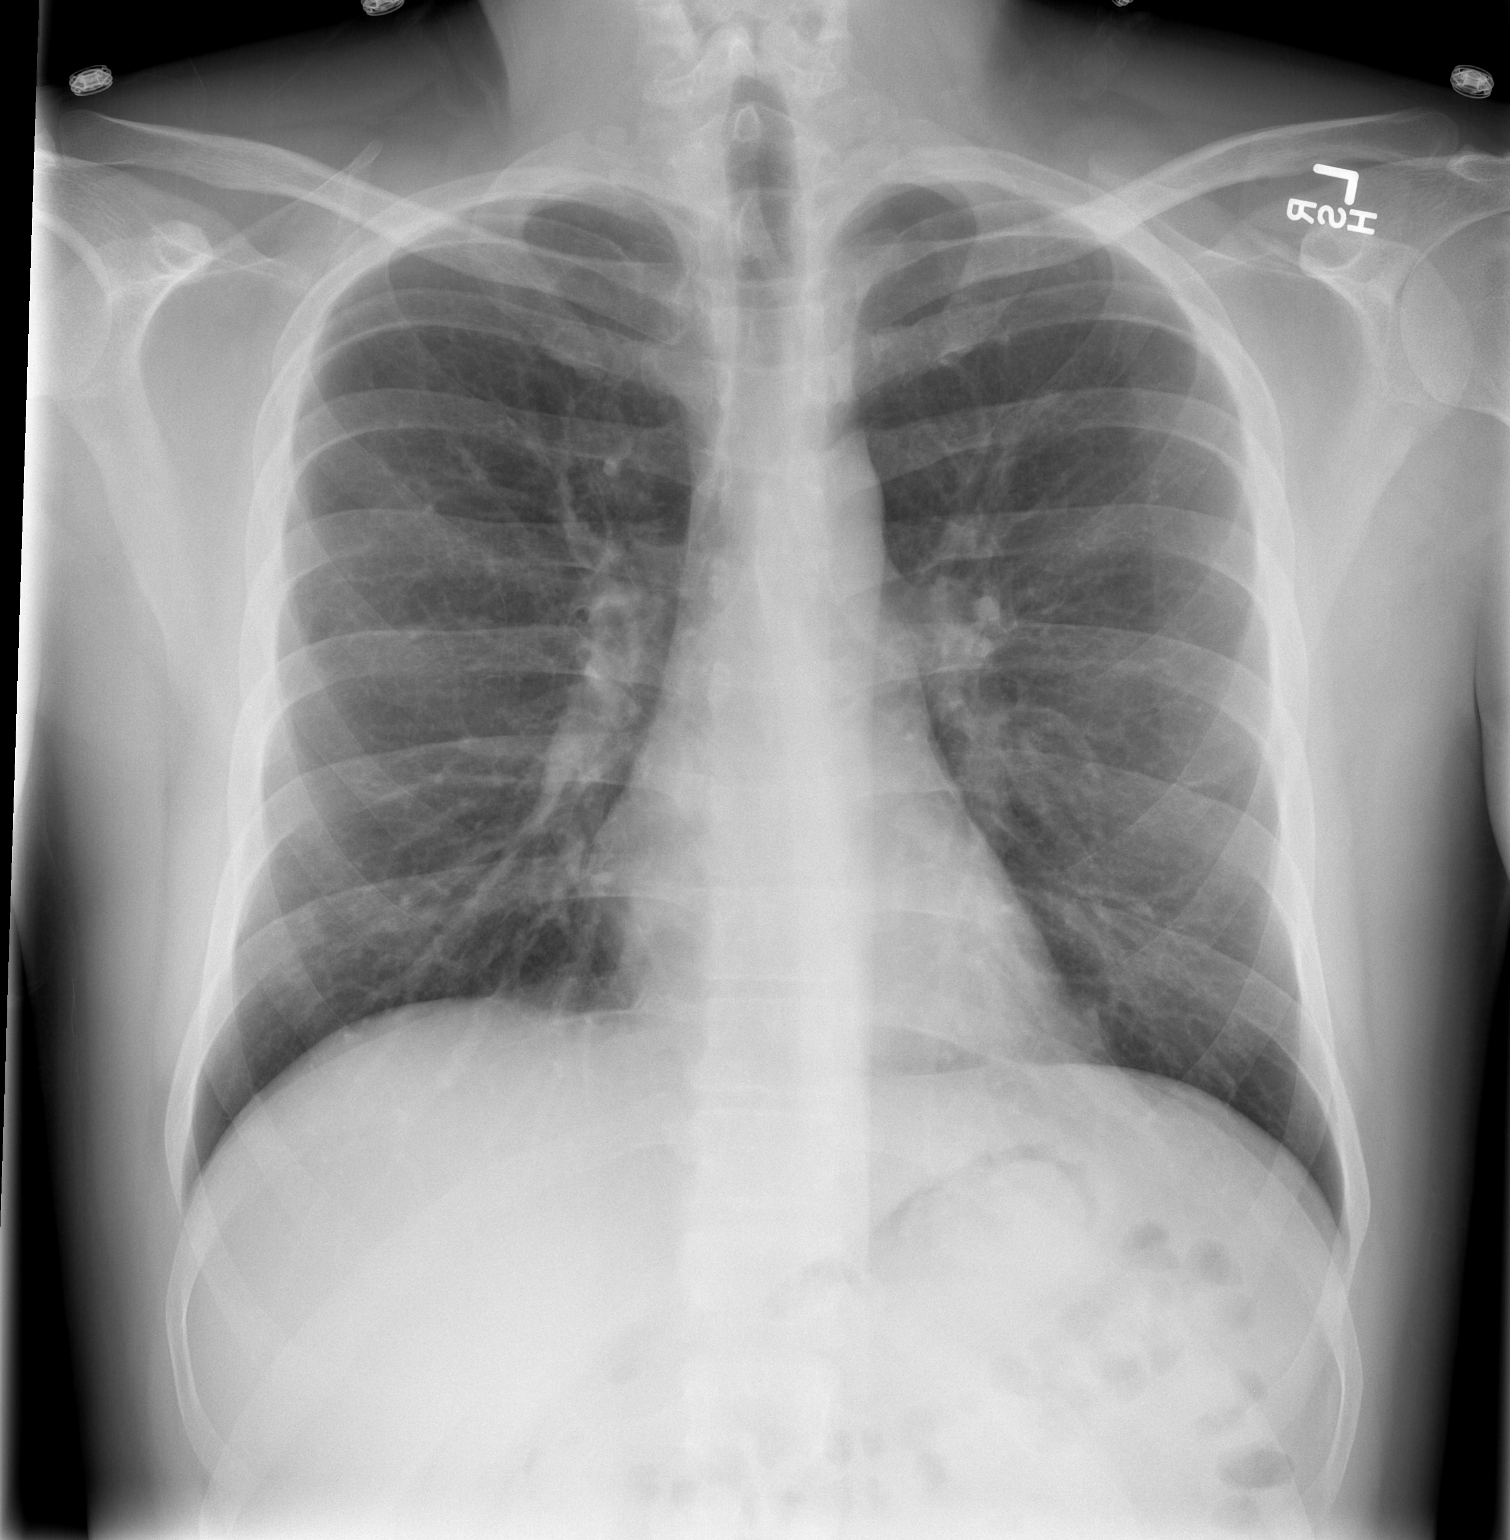

[w chest lat]
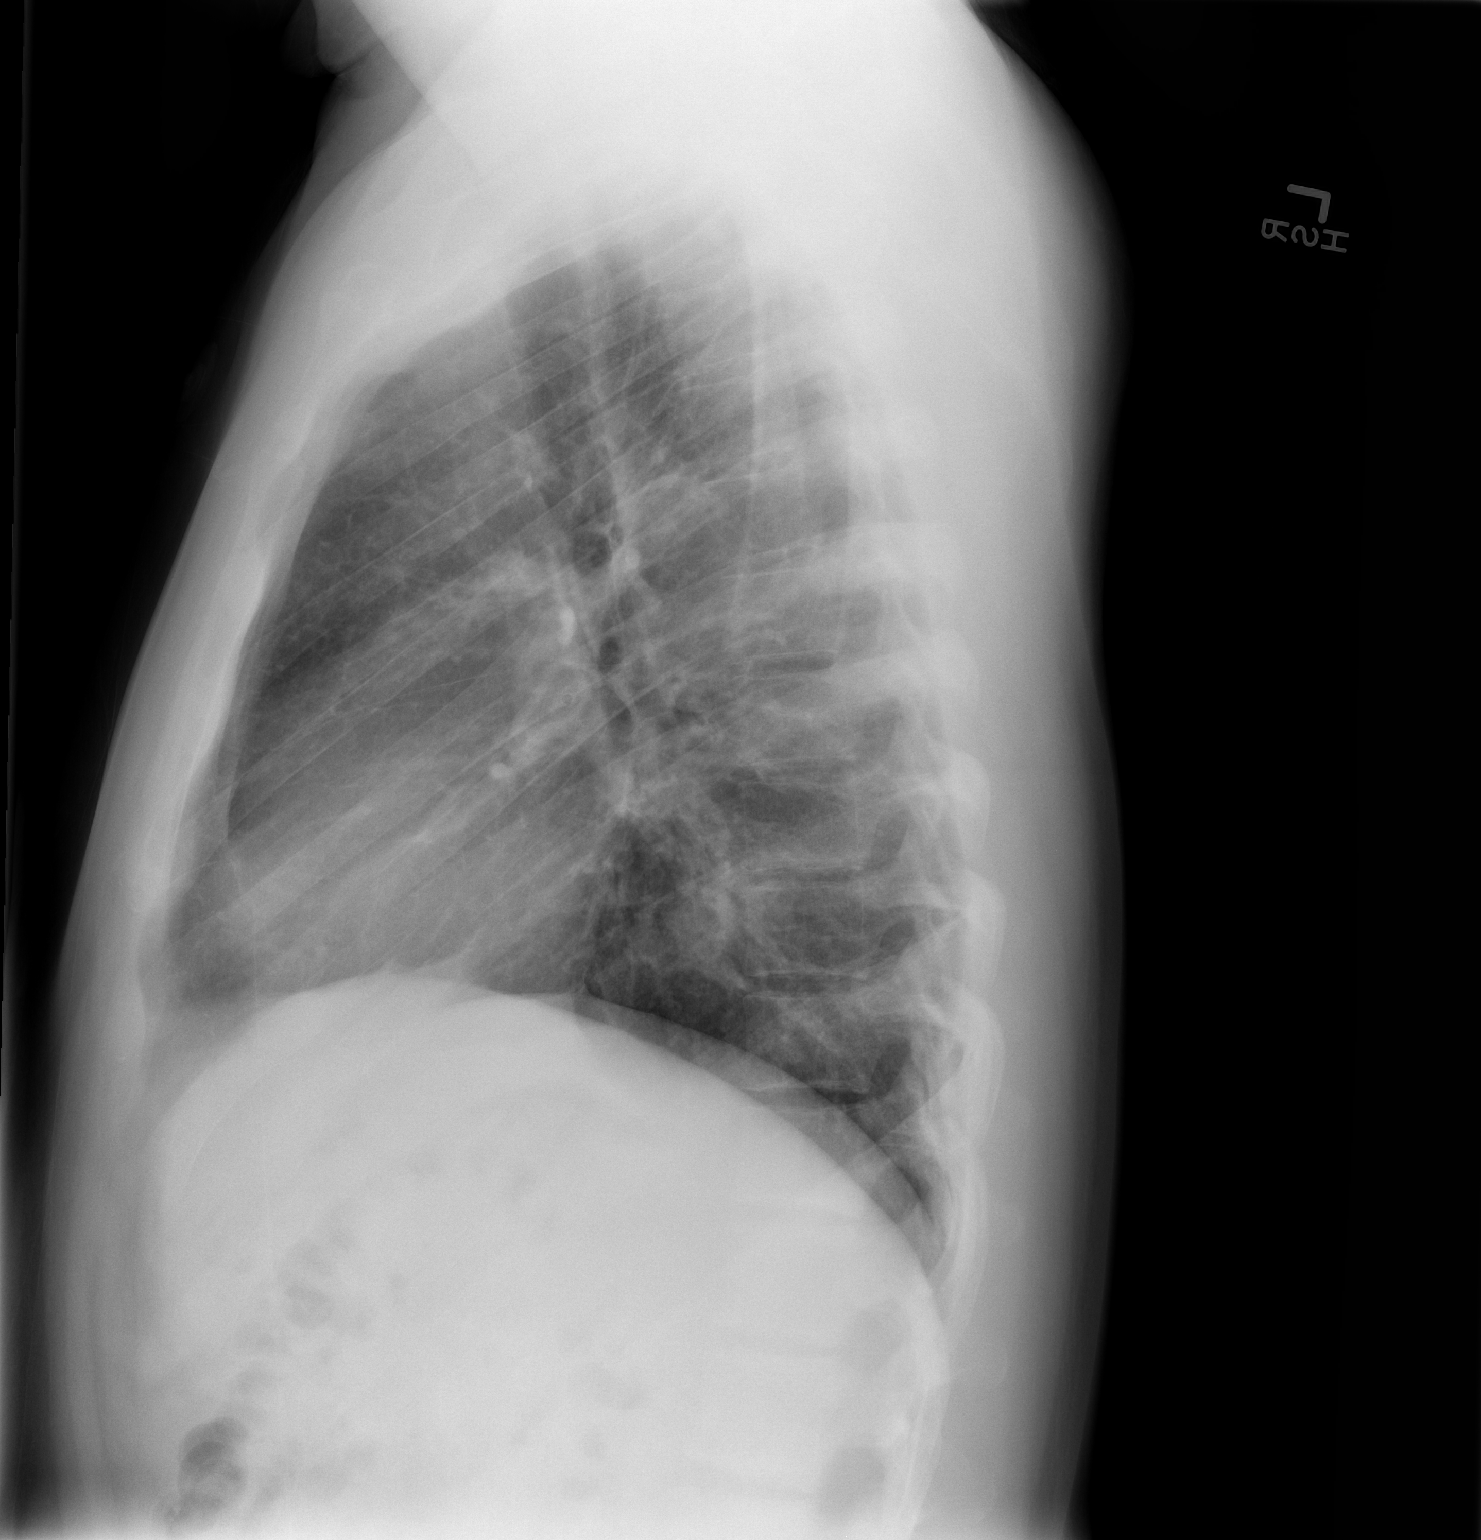

[2 of 2 positions shown; findings below may reference images not displayed]

FINDINGS: The heart size and mediastinal contours are within normal limits.
Both lungs are clear. The visualized skeletal structures are
unremarkable.
IMPRESSION: No active cardiopulmonary disease.

## 2016-05-29 ENCOUNTER — Encounter (HOSPITAL_COMMUNITY): Payer: Self-pay | Admitting: Emergency Medicine

## 2016-05-29 DIAGNOSIS — Z87891 Personal history of nicotine dependence: Secondary | ICD-10-CM | POA: Insufficient documentation

## 2016-05-29 DIAGNOSIS — J449 Chronic obstructive pulmonary disease, unspecified: Secondary | ICD-10-CM | POA: Insufficient documentation

## 2016-05-29 DIAGNOSIS — F909 Attention-deficit hyperactivity disorder, unspecified type: Secondary | ICD-10-CM | POA: Insufficient documentation

## 2016-05-29 DIAGNOSIS — Z79899 Other long term (current) drug therapy: Secondary | ICD-10-CM | POA: Insufficient documentation

## 2016-05-29 DIAGNOSIS — K0889 Other specified disorders of teeth and supporting structures: Secondary | ICD-10-CM | POA: Insufficient documentation

## 2016-05-29 NOTE — ED Triage Notes (Signed)
C/o R upper toothache x 3 days.

## 2016-05-30 ENCOUNTER — Emergency Department (HOSPITAL_COMMUNITY)
Admission: EM | Admit: 2016-05-30 | Discharge: 2016-05-30 | Disposition: A | Discharge status: Home / Self Care | Payer: Self-pay | Attending: Emergency Medicine | Admitting: Emergency Medicine

## 2016-05-30 DIAGNOSIS — K0889 Other specified disorders of teeth and supporting structures: Secondary | ICD-10-CM

## 2016-05-30 MED ORDER — OXYCODONE-ACETAMINOPHEN 5-325 MG PO TABS
1.0000 | ORAL_TABLET | Freq: Once | ORAL | Status: AC
  Administered 2016-05-30: 1 via ORAL
  Filled 2016-05-30: qty 1

## 2016-05-30 MED ORDER — OXYCODONE-ACETAMINOPHEN 5-325 MG PO TABS: 1.0000 | ORAL_TABLET | ORAL | 0 refills | Status: DC | PRN

## 2016-05-30 MED ORDER — AMOXICILLIN 500 MG PO CAPS
500.0000 mg | ORAL_CAPSULE | Freq: Three times a day (TID) | ORAL | Status: DC
  Administered 2016-05-30: 500 mg via ORAL
  Filled 2016-05-30: qty 1

## 2016-05-30 MED ORDER — AMOXICILLIN 500 MG PO CAPS: 500.0000 mg | ORAL_CAPSULE | Freq: Two times a day (BID) | ORAL | 0 refills | Status: AC

## 2016-05-30 MED ORDER — IBUPROFEN 600 MG PO TABS: 600.0000 mg | ORAL_TABLET | Freq: Four times a day (QID) | ORAL | 0 refills | Status: DC | PRN

## 2016-05-30 NOTE — ED Provider Notes (Signed)
[facility]PT Provider Note   CSN: [id number] Arrival date & time: 05/29/16  2308     History   Chief Complaint Chief Complaint  Patient presents with  . Dental Pain    HPI Devin [name] is a 36 y.o. male.  HPI  Patient is a 36 year old male with history of asthma who presents the ED with complaint of dental pain, onset 3 days. Patient reports having worsening pain to his right upper molar for the past 3 days. He notes his tooth has been cracked for while but states he began having pain a few days ago. He reports taking Tylenol at home without relief. Endorses a small amount of drainage noted from the tooth. Denies fever, chills, facial/neck swelling, trismus, drooling, dysphagia, difficulty breathing, vomiting. Patient reports he currently does not have a dentist.  Past Medical History:  Diagnosis Date  . ADHD (attention deficit hyperactivity disorder)   . Asthma   . GERD (gastroesophageal reflux disease)   . Transaminitis 07/17/2015    Patient Active Problem List   Diagnosis Date Noted  . Hypoxia   . COPD exacerbation (HCC)   . Leukocytosis 11/04/2015  . Asthma exacerbation 11/03/2015  . Transaminitis 07/17/2015  . Tooth ache 07/17/2015  . Status asthmaticus 06/08/2014  . Asthmatic bronchitis with exacerbation 06/08/2014  . Asthma with status asthmaticus 10/14/2011  . ADHD (attention deficit hyperactivity disorder) 10/14/2011    Past Surgical History:  Procedure Laterality Date  . TYMPANOPLASTY Left 1997       Home Medications    Prior to Admission medications   Medication Sig Start Date End Date Taking? Authorizing Provider  albuterol (PROVENTIL HFA;VENTOLIN HFA) 108 (90 Base) MCG/ACT inhaler Inhale 2 puffs into the lungs every 6 (six) hours as needed for wheezing or shortness of breath. Use 3 times daily for 4 days, then use as needed. Patient not taking: Reported on 02/05/2016 01/05/16   Quentin Angst, MD  albuterol (PROVENTIL) (2.5 MG/3ML)  0.083% nebulizer solution Take 3 mLs (2.5 mg total) by nebulization every 6 (six) hours as needed for wheezing or shortness of breath. Patient not taking: Reported on 02/05/2016 07/20/15   [name], MD  amoxicillin (AMOXIL) 500 MG capsule Take 1 capsule (500 mg total) by mouth 2 (two) times daily. 05/30/16 06/06/16  Barrett Henle, PA-C  Fluticasone-Salmeterol (ADVAIR DISKUS) 250-50 MCG/DOSE AEPB Inhale 1 puff into the lungs 2 (two) times daily. Patient not taking: Reported on 02/05/2016 01/02/16   Quentin Angst, MD  ibuprofen (ADVIL,MOTRIN) 600 MG tablet Take 1 tablet (600 mg total) by mouth every 6 (six) hours as needed. 05/30/16   Barrett Henle, PA-C  oxyCODONE-acetaminophen (PERCOCET/ROXICET) 5-325 MG tablet Take 1 tablet by mouth every 4 (four) hours as needed for severe pain. 05/30/16   Barrett Henle, PA-C  predniSONE (DELTASONE) 20 MG tablet Take 1-3 tablets (20-60 mg total) by mouth daily before breakfast. Take 3 tablets (60mg ) daily x 2 days, then 2 tablets (40mg ) daily x 3 days, then 1 tablet (20mg ) daily x 3 days then stop. Patient not taking: Reported on 02/05/2016 11/07/15   Rodolph Bong, MD  tiotropium (SPIRIVA HANDIHALER) 18 MCG inhalation capsule Place 1 capsule (18 mcg total) into inhaler and inhale 2 (two) times daily. Patient not taking: Reported on 02/05/2016 01/05/16   Quentin Angst, MD    Family History Family History  Problem Relation Age of Onset  . Coronary artery disease Father   . Heart attack Father   .  Heart disease Father   . Heart failure Mother   . Hyperlipidemia Mother   . Hypertension Mother   . Migraines Mother   . COPD Mother   . Diabetes Maternal Aunt   . Asthma Maternal Grandfather   . Asthma Paternal Grandfather     Social History Social History  Substance Use Topics  . Smoking status: Former Smoker    Packs/day: 0.25    Years: 16.00    Types: Cigarettes    Quit date: 06/21/2014  . Smokeless tobacco:  Never Used  . Alcohol use 0.0 oz/week     Comment: 7/14//2017 "I'll drink 2-3 beers watching football/social events"     Allergies   Amoxicillin-pot clavulanate and Other   Review of Systems Review of Systems  Constitutional: Negative for fever.  HENT: Positive for dental problem.   Respiratory: Negative for shortness of breath.   Neurological: Negative for headaches.     Physical Exam Updated Vital Signs BP (!) 149/105 (BP Location: Left Arm)   Pulse 87   Temp 98.3 F (36.8 C) (Oral)   Resp 16   SpO2 98%   Physical Exam  Constitutional: He is oriented to person, place, and time. He appears well-developed and well-nourished.  HENT:  Head: Normocephalic and atraumatic.  Nose: Nose normal.  Mouth/Throat: Uvula is midline, oropharynx is clear and moist and mucous membranes are normal. No oral lesions. No trismus in the jaw. Abnormal dentition. Dental caries present. No dental abscesses or uvula swelling. No oropharyngeal exudate, posterior oropharyngeal edema, posterior oropharyngeal erythema or tonsillar abscesses. No tonsillar exudate.    Very poor dentition throughout with multiple decaying, cracked teeth and multiple dental caries present. Tooth #3 cracked with half of molar absent, surrounding gingiva TTP without swelling, induration, fluctuance or drainage.  No trismus, drooling, facial/neck swelling or stridor on exam. No muffled voice. Floor of mouth soft.    Eyes: Conjunctivae and EOM are normal. Right eye exhibits no discharge. Left eye exhibits no discharge. No scleral icterus.  Neck: Normal range of motion. Neck supple.  Cardiovascular: Normal rate.   Pulmonary/Chest: Effort normal. No respiratory distress.  Lymphadenopathy:    He has no cervical adenopathy.  Neurological: He is alert and oriented to person, place, and time.  Nursing note and vitals reviewed.    ED Treatments / Results  Labs (all labs ordered are listed, but only abnormal results are  displayed) Labs Reviewed - No data to display  EKG  EKG Interpretation None       Radiology No results found.  Procedures Procedures (including critical care time)  Medications Ordered in ED Medications  oxyCODONE-acetaminophen (PERCOCET/ROXICET) 5-325 MG per tablet 1 tablet (not administered)  amoxicillin (AMOXIL) capsule 500 mg (not administered)     Initial Impression / Assessment and Plan / ED Course  I have reviewed the triage vital signs and the nursing notes.  Pertinent labs & imaging results that were available during my care of the patient were reviewed by me and considered in my medical decision making (see chart for details).     Patient with toothache.  No gross abscess.  Exam unconcerning for Ludwig's angina or spread of infection.  Will treat with amoxicillin (requested over PCN by pt) and pain medicine.  Urged patient to follow-up with dentist.     Final Clinical Impressions(s) / ED Diagnoses   Final diagnoses:  Pain, dental    New Prescriptions New Prescriptions   AMOXICILLIN (AMOXIL) 500 MG CAPSULE    Take 1  capsule (500 mg total) by mouth 2 (two) times daily.   IBUPROFEN (ADVIL,MOTRIN) 600 MG TABLET    Take 1 tablet (600 mg total) by mouth every 6 (six) hours as needed.   OXYCODONE-ACETAMINOPHEN (PERCOCET/ROXICET) 5-325 MG TABLET    Take 1 tablet by mouth every 4 (four) hours as needed for severe pain.     Satira Sark [place], [place] 05/30/16 1610    Pricilla Loveless, MD 06/03/16 1719

## 2016-05-30 NOTE — Discharge Instructions (Signed)
Take medications as prescribed. You may also take 600 mg ibuprofen 4 times daily as needed for pain relief. I recommend eating prior to taking ibuprofen to prevent gastrointestinal side effects. You may apply ice to affected area for 15-20 minutes 3-4 times daily to help with pain. Follow-up with one of the dental clinics listed below for further management of your dental pain. Return to the emergency department if symptoms worsen or new onset of fever, headache, neck stiffness, facial/neck swelling, unable to open jaw, unable to swallow resulting in drooling, difficulty breathing, drainage, unable to tolerate fluids.   Dental Care: Organization         Address  Phone  Notes  [facility] [address], [place] [phone] Accepts children up to age 34 who are enrolled in [place] or [place] Health Choice; pregnant women with a Medicaid card; and children who have applied for Medicaid or [place] Health Choice, but were declined, whose parents can pay a reduced fee at time of service.  [facility] [place]  [address], [place] [phone] Accepts children up to age 34 who are enrolled in [place] or [place] Health Choice; pregnant women with a Medicaid card; and children who have applied for Medicaid or [place] Health Choice, but were declined, whose parents can pay a reduced fee at time of service.  [facility]  [address], [place] [phone] Patients are seen by appointment only. Walk-ins are not accepted. [facility] will see patients 34 years of age and older. Monday - Tuesday (8am-5pm) Most Wednesdays (8:30-5pm) $30 per visit, cash only  [facility]  [address], [place] [facility] [place] [place] [phone] Patients are seen by appointment only. Walk-ins are not accepted. [facility] will see patients 34 years of age and older. One  Wednesday Evening (Monthly: Volunteer Based).  $30 per visit, cash only  [facility]  [phone] for adults; Children under age 384, call Graduate Pediatric Dentistry at [phone]. Children aged 634-14, please call [phone] to request a pediatric application.  Dental services are provided in all areas of dental care including fillings, crowns and bridges, complete and partial dentures, implants, gum treatment, root canals, and extractions. Preventive care is also provided. Treatment is provided to both adults and children. Patients are selected via a lottery and there is often a waiting list.   [facility] [address], [place]  [phone] [email or web address]   [facility] [address], [place], [place] [phone], [phone]. [phone] Second and Fourth Thursday of each month, opens at 6:30 AM; Clinic ends at 9 AM.  Patients are seen on a first-come first-served basis, and a limited number are seen during each clinic.   [facility]  [address], [place], [place] [phone]   Eligibility Requirements You must have lived in [place], [place], or [place] counties for at least the last three months.   You cannot be eligible for state or federal sponsored [place]care insurance, including [employer], [place], or [employer]icare.   You generally cannot be eligible for healthcare insurance through your employer.    How to apply: Eligibility screenings are held every Tuesday and Wednesday afternoon from 1:00 pm until 4:00 pm. You do not need an appointment for the interview!  [facility] [address], [place], [place] [phone]  [facility]  [phone]   [facility]  [phone]   [facility]  [phone]      [facility] [address] [place], [place] [address] Phone [phone]  [facility] [employer] [facility] [place] exemplifies the Dental School?s vision to improve the health and quality of life of all [place]ians by Public house manager with a passion to care for the underserved and by leading the nation in community-based, service learning oral health education.  We are committed to offering comprehensive general dental services for adults, children and special needs patients in a safe, caring and professional setting.   Appointments: Our clinic is open Monday through Friday 8:00 a.m. until 5:00 p.m. The amount of time scheduled for an appointment depends on the patient?s specific needs. We ask that you keep your appointed time for care or provide 24-hour notice of all appointment changes. Parents or legal guardians must accompany minor children.   Payment for Services: Medicaid and other insurance plans are welcome. Payment for services is due when services are rendered and may be made by cash or credit card. If you have dental insurance, we will assist you with your claim submission.    Emergencies:  Emergency services will be provided Monday through Friday on a walk-in basis.  Please arrive early for emergency services. After hours emergency services will be provided for patients of record as required.   Services:  Comprehensive General Dentistry Children?s Dentistry Oral Surgery - Extractions Root Canals Sealants and Tooth Colored Fillings Crowns and Ship broker 3-D/[facility]

## 2016-07-17 ENCOUNTER — Emergency Department (HOSPITAL_COMMUNITY)
Admission: EM | Admit: 2016-07-17 | Discharge: 2016-07-18 | Disposition: A | Discharge status: Home / Self Care | Payer: Self-pay | Attending: Emergency Medicine | Admitting: Emergency Medicine

## 2016-07-17 ENCOUNTER — Encounter (HOSPITAL_COMMUNITY): Payer: Self-pay | Admitting: Emergency Medicine

## 2016-07-17 DIAGNOSIS — F909 Attention-deficit hyperactivity disorder, unspecified type: Secondary | ICD-10-CM | POA: Insufficient documentation

## 2016-07-17 DIAGNOSIS — J45901 Unspecified asthma with (acute) exacerbation: Secondary | ICD-10-CM | POA: Insufficient documentation

## 2016-07-17 DIAGNOSIS — Z79899 Other long term (current) drug therapy: Secondary | ICD-10-CM | POA: Insufficient documentation

## 2016-07-17 DIAGNOSIS — Z87891 Personal history of nicotine dependence: Secondary | ICD-10-CM | POA: Insufficient documentation

## 2016-07-17 MED ORDER — ALBUTEROL SULFATE (2.5 MG/3ML) 0.083% IN NEBU
5.0000 mg | INHALATION_SOLUTION | Freq: Once | RESPIRATORY_TRACT | Status: AC
  Administered 2016-07-17: 5 mg via RESPIRATORY_TRACT

## 2016-07-17 MED ORDER — ALBUTEROL SULFATE (2.5 MG/3ML) 0.083% IN NEBU
INHALATION_SOLUTION | RESPIRATORY_TRACT | Status: AC
  Filled 2016-07-17: qty 6

## 2016-07-17 NOTE — ED Triage Notes (Signed)
Patient reports asthma attack this evening with wheezing and dry cough , denies fever .

## 2016-07-18 ENCOUNTER — Emergency Department (HOSPITAL_COMMUNITY): Payer: Self-pay

## 2016-07-18 LAB — BASIC METABOLIC PANEL
ANION GAP: 10 (ref 5–15)
BUN: 10 mg/dL (ref 6–20)
CHLORIDE: 104 mmol/L (ref 101–111)
CO2: 23 mmol/L (ref 22–32)
Calcium: 8.5 mg/dL — ABNORMAL LOW (ref 8.9–10.3)
Creatinine, Ser: 1.07 mg/dL (ref 0.61–1.24)
GFR calc Af Amer: >60 mL/min (ref >60)
GFR calc non Af Amer: >60 mL/min (ref >60)
GLUCOSE: 89 mg/dL (ref 65–99)
POTASSIUM: 3.3 mmol/L — AB (ref 3.5–5.1)
SODIUM: 137 mmol/L (ref 135–145)

## 2016-07-18 LAB — CBC
HCT: 39.4 % (ref 39.0–52.0)
Hemoglobin: 14 g/dL (ref 13.0–17.0)
MCH: 32 pg (ref 26.0–34.0)
MCHC: 35.5 g/dL (ref 30.0–36.0)
MCV: 90.2 fL (ref 78.0–100.0)
Platelets: 322 10*3/uL (ref 150–400)
RBC: 4.37 MIL/uL (ref 4.22–5.81)
RDW: 12.9 % (ref 11.5–15.5)
WBC: 10.6 10*3/uL — ABNORMAL HIGH (ref 4.0–10.5)

## 2016-07-18 MED ORDER — METHYLPREDNISOLONE SODIUM SUCC 125 MG IJ SOLR
125.0000 mg | Freq: Once | INTRAMUSCULAR | Status: AC
  Administered 2016-07-18: 125 mg via INTRAVENOUS
  Filled 2016-07-18: qty 2

## 2016-07-18 MED ORDER — PREDNISONE 20 MG PO TABS: 40.0000 mg | ORAL_TABLET | Freq: Every day | ORAL | 0 refills | Status: DC

## 2016-07-18 MED ORDER — MAGNESIUM SULFATE 2 GM/50ML IV SOLN
2.0000 g | Freq: Once | INTRAVENOUS | Status: AC
  Administered 2016-07-18: 2 g via INTRAVENOUS
  Filled 2016-07-18: qty 50

## 2016-07-18 MED ORDER — IPRATROPIUM-ALBUTEROL 0.5-2.5 (3) MG/3ML IN SOLN
3.0000 mL | Freq: Once | RESPIRATORY_TRACT | Status: AC
  Administered 2016-07-18: 3 mL via RESPIRATORY_TRACT
  Filled 2016-07-18: qty 3

## 2016-07-18 MED ORDER — SODIUM CHLORIDE 0.9 % IV BOLUS (SEPSIS)
1000.0000 mL | Freq: Once | INTRAVENOUS | Status: AC
  Administered 2016-07-18: 1000 mL via INTRAVENOUS

## 2016-07-18 MED ORDER — SODIUM CHLORIDE 0.9 % IV SOLN
INTRAVENOUS | Status: DC
  Administered 2016-07-18: 01:00:00 via INTRAVENOUS

## 2016-07-18 MED ORDER — ALBUTEROL (5 MG/ML) CONTINUOUS INHALATION SOLN
10.0000 mg/h | INHALATION_SOLUTION | RESPIRATORY_TRACT | Status: AC
  Administered 2016-07-18: 10 mg/h via RESPIRATORY_TRACT
  Filled 2016-07-18: qty 20

## 2016-07-18 MED ORDER — ALBUTEROL SULFATE (2.5 MG/3ML) 0.083% IN NEBU
5.0000 mg | INHALATION_SOLUTION | Freq: Once | RESPIRATORY_TRACT | Status: AC
  Administered 2016-07-18: 5 mg via RESPIRATORY_TRACT
  Filled 2016-07-18: qty 6

## 2016-07-18 MED ORDER — IPRATROPIUM BROMIDE 0.02 % IN SOLN
0.5000 mg | Freq: Once | RESPIRATORY_TRACT | Status: AC
  Administered 2016-07-18: 0.5 mg via RESPIRATORY_TRACT
  Filled 2016-07-18: qty 2.5

## 2016-07-18 MED ORDER — ALBUTEROL SULFATE HFA 108 (90 BASE) MCG/ACT IN AERS
2.0000 | INHALATION_SPRAY | Freq: Once | RESPIRATORY_TRACT | Status: AC
  Administered 2016-07-18: 2 via RESPIRATORY_TRACT
  Filled 2016-07-18: qty 6.7

## 2016-07-18 NOTE — ED Notes (Signed)
Pt transported to xray, cleared by primary RN.  

## 2016-07-18 NOTE — ED Notes (Signed)
ED Provider at bedside. 

## 2016-07-18 NOTE — ED Provider Notes (Signed)
[facility] Provider Note   CSN: [id number] Arrival date & time: 07/17/16  2056     History   Chief Complaint Chief Complaint  Patient presents with  . Asthma    HPI Devin [name] is a 36 y.o. male who presents emergency Department with chief complaint of asthma exacerbation. Patient states he has has a history of asthma, present since hip since childhood. He has had multiple hospitalizations for previously without intubation. Patient states that he was walking today and had sudden onset chest tightness and wheezing. Patient states it seems to be worse when he is under significant stress. However, it is also allergy season and seems to be worse at this time. He does not currently have a rescue inhaler and was unable to treat himself prior to arrival in the emergency department. He complains of severe chest tightness and wheezing. He denies fevers, cough, symptoms of recent URI.  HPI  Past Medical History:  Diagnosis Date  . ADHD (attention deficit hyperactivity disorder)   . Asthma   . GERD (gastroesophageal reflux disease)   . Transaminitis 07/17/2015    Patient Active Problem List   Diagnosis Date Noted  . Hypoxia   . COPD exacerbation (HCC)   . Leukocytosis 11/04/2015  . Asthma exacerbation 11/03/2015  . Transaminitis 07/17/2015  . Tooth ache 07/17/2015  . Status asthmaticus 06/08/2014  . Asthmatic bronchitis with exacerbation 06/08/2014  . Asthma with status asthmaticus 10/14/2011  . ADHD (attention deficit hyperactivity disorder) 10/14/2011    Past Surgical History:  Procedure Laterality Date  . TYMPANOPLASTY Left 1997       Home Medications    Prior to Admission medications   Medication Sig Start Date End Date Taking? Authorizing Provider  albuterol (PROVENTIL HFA;VENTOLIN HFA) 108 (90 Base) MCG/ACT inhaler Inhale 2 puffs into the lungs every 6 (six) hours as needed for wheezing or shortness of breath. Use 3 times daily for 4 days, then use as  needed. Patient not taking: Reported on 02/05/2016 01/05/16   Quentin Angst, MD  albuterol (PROVENTIL) (2.5 MG/3ML) 0.083% nebulizer solution Take 3 mLs (2.5 mg total) by nebulization every 6 (six) hours as needed for wheezing or shortness of breath. Patient not taking: Reported on 02/05/2016 07/20/15   [name], MD  Fluticasone-Salmeterol (ADVAIR DISKUS) 250-50 MCG/DOSE AEPB Inhale 1 puff into the lungs 2 (two) times daily. Patient not taking: Reported on 02/05/2016 01/02/16   Quentin Angst, MD  ibuprofen (ADVIL,MOTRIN) 600 MG tablet Take 1 tablet (600 mg total) by mouth every 6 (six) hours as needed. 05/30/16   Barrett Henle, PA-C  oxyCODONE-acetaminophen (PERCOCET/ROXICET) 5-325 MG tablet Take 1 tablet by mouth every 4 (four) hours as needed for severe pain. 05/30/16   Barrett Henle, PA-C  predniSONE (DELTASONE) 20 MG tablet Take 1-3 tablets (20-60 mg total) by mouth daily before breakfast. Take 3 tablets (60mg ) daily x 2 days, then 2 tablets (40mg ) daily x 3 days, then 1 tablet (20mg ) daily x 3 days then stop. Patient not taking: Reported on 02/05/2016 11/07/15   Rodolph Bong, MD  tiotropium (SPIRIVA HANDIHALER) 18 MCG inhalation capsule Place 1 capsule (18 mcg total) into inhaler and inhale 2 (two) times daily. Patient not taking: Reported on 02/05/2016 01/05/16   Quentin Angst, MD    Family History Family History  Problem Relation Age of Onset  . Coronary artery disease Father   . Heart attack Father   . Heart disease Father   . Heart failure  Mother   . Hyperlipidemia Mother   . Hypertension Mother   . Migraines Mother   . COPD Mother   . Diabetes Maternal Aunt   . Asthma Maternal Grandfather   . Asthma Paternal Grandfather     Social History Social History  Substance Use Topics  . Smoking status: Former Smoker    Packs/day: 0.25    Years: 16.00    Types: Cigarettes    Quit date: 06/21/2014  . Smokeless tobacco: Never Used  . Alcohol  use 0.0 oz/week     Comment: 7/14//2017 "I'll drink 2-3 beers watching football/social events"     Allergies   Amoxicillin-pot clavulanate and Other   Review of Systems Review of Systems  Ten systems reviewed and are negative for acute change, except as noted in the HPI.   Physical Exam Updated Vital Signs BP 129/76 (BP Location: Left Arm)   Pulse (!) 103   Temp 98.8 F (37.1 C) (Oral)   Resp 16   SpO2 97%   Physical Exam  Constitutional: He appears well-developed and well-nourished. No distress.  HENT:  Head: Normocephalic and atraumatic.  Eyes: Conjunctivae are normal. No scleral icterus.  Neck: Normal range of motion. Neck supple.  Cardiovascular: Normal rate, regular rhythm and normal heart sounds.   Pulmonary/Chest: He has wheezes.  Patient speaking in full sentences, decreased air movement, increased work of breathing with retractions, diffuse inspiratory and expiratory wheezes noted  Abdominal: Soft. There is no tenderness.  Musculoskeletal: He exhibits no edema.  Neurological: He is alert.  Skin: Skin is warm and dry. He is not diaphoretic.  Psychiatric: His behavior is normal.  Nursing note and vitals reviewed.    ED Treatments / Results  Labs (all labs ordered are listed, but only abnormal results are displayed) Labs Reviewed  CBC  BASIC METABOLIC PANEL    EKG  EKG Interpretation None       Radiology No results found.  Procedures Procedures (including critical care time)  Medications Ordered in ED Medications  sodium chloride 0.9 % bolus 1,000 mL (not administered)    And  0.9 %  sodium chloride infusion (not administered)  ipratropium-albuterol (DUONEB) 0.5-2.5 (3) MG/3ML nebulizer solution 3 mL (not administered)  magnesium sulfate IVPB 2 g 50 mL (not administered)  methylPREDNISolone sodium succinate (SOLU-MEDROL) 125 mg/2 mL injection 125 mg (not administered)  albuterol (PROVENTIL) (2.5 MG/3ML) 0.083% nebulizer solution 5 mg (not  administered)  ipratropium (ATROVENT) nebulizer solution 0.5 mg (not administered)  albuterol (PROVENTIL) (2.5 MG/3ML) 0.083% nebulizer solution 5 mg (5 mg Nebulization Given 07/17/16 2131)     Initial Impression / Assessment and Plan / ED Course  I have reviewed the triage vital signs and the nursing notes.  Pertinent labs & imaging results that were available during my care of the patient were reviewed by me and considered in my medical decision making (see chart for details).  Clinical Course as of Jul 19 715  Thu Jul 18, 2016  0318 Patient continues to have diffuse wheezing.   [AH]    Clinical Course User Index [AH] Arthor Captain, PA-C    Patient with acute asthma exacerbation. His chest x-ray is negative. Labs reviewed. He has a slight leukocytosis. However, he appears to have a persistently elevated white blood cell count and this is lower than normal today. Afebrile. Patient has improved significantly with  treatment here in the emergency department and feels that he is at baseline. He was discharged with albuterol inhaler. Patient appears safe  for discharge. I discussed return precautions.                                                                                                                                                                                                                                                                                                                                               Final Clinical Impressions(s) / ED Diagnoses   Final diagnoses:  Moderate asthma with exacerbation, unspecified whether persistent    New Prescriptions New Prescriptions   No medications on file     Arthor Captainbigail Tyresse Jayson, PA-C 07/18/16 [id number]    Zadie Rhineonald Wickline, MD 07/18/16 [phone]

## 2016-07-18 NOTE — Discharge Instructions (Signed)
Get help right away if: °You are getting worse. °You have trouble breathing. If severe, call your local emergency services (911 in the [place] °You develop chest pain or discomfort. °You are vomiting. °You are not able to keep fluids down. °You are coughing up yellow, green, brown, or bloody sputum. °You have a fever and your symptoms suddenly get worse. °You have trouble swallowing °

## 2016-07-23 ENCOUNTER — Encounter (HOSPITAL_COMMUNITY): Payer: Self-pay | Admitting: *Deleted

## 2016-07-23 ENCOUNTER — Emergency Department (HOSPITAL_COMMUNITY): Payer: Self-pay

## 2016-07-23 DIAGNOSIS — F909 Attention-deficit hyperactivity disorder, unspecified type: Secondary | ICD-10-CM | POA: Insufficient documentation

## 2016-07-23 DIAGNOSIS — Z87891 Personal history of nicotine dependence: Secondary | ICD-10-CM | POA: Insufficient documentation

## 2016-07-23 DIAGNOSIS — J449 Chronic obstructive pulmonary disease, unspecified: Secondary | ICD-10-CM | POA: Insufficient documentation

## 2016-07-23 DIAGNOSIS — J4521 Mild intermittent asthma with (acute) exacerbation: Secondary | ICD-10-CM | POA: Insufficient documentation

## 2016-07-23 LAB — CBC
HCT: 43.6 % (ref 39.0–52.0)
Hemoglobin: 15 g/dL (ref 13.0–17.0)
MCH: 31.3 pg (ref 26.0–34.0)
MCHC: 34.4 g/dL (ref 30.0–36.0)
MCV: 90.8 fL (ref 78.0–100.0)
PLATELETS: 414 10*3/uL — AB (ref 150–400)
RBC: 4.8 MIL/uL (ref 4.22–5.81)
RDW: 13.2 % (ref 11.5–15.5)
WBC: 13.6 10*3/uL — ABNORMAL HIGH (ref 4.0–10.5)

## 2016-07-23 MED ORDER — ALBUTEROL SULFATE (2.5 MG/3ML) 0.083% IN NEBU
5.0000 mg | INHALATION_SOLUTION | Freq: Once | RESPIRATORY_TRACT | Status: AC
  Administered 2016-07-23: 5 mg via RESPIRATORY_TRACT

## 2016-07-23 MED ORDER — ALBUTEROL SULFATE (2.5 MG/3ML) 0.083% IN NEBU
INHALATION_SOLUTION | RESPIRATORY_TRACT | Status: AC
  Filled 2016-07-23: qty 6

## 2016-07-23 NOTE — ED Triage Notes (Signed)
Pt to ED c/o asthma attack worsening today. Pt was seen on 3/28 for same and was going to get admitted but was discharged. Pt has been using inhaler without relief. Also reports feeling "like my heart in beating out of my chest." HR 115 on EKG. Exp and insp wheezing noted

## 2016-07-24 ENCOUNTER — Emergency Department (HOSPITAL_COMMUNITY)
Admission: EM | Admit: 2016-07-24 | Discharge: 2016-07-24 | Disposition: A | Discharge status: Home / Self Care | Payer: Self-pay | Attending: Emergency Medicine | Admitting: Emergency Medicine

## 2016-07-24 DIAGNOSIS — J45901 Unspecified asthma with (acute) exacerbation: Secondary | ICD-10-CM

## 2016-07-24 LAB — BASIC METABOLIC PANEL
ANION GAP: 11 (ref 5–15)
BUN: 11 mg/dL (ref 6–20)
CALCIUM: 9.3 mg/dL (ref 8.9–10.3)
CO2: 23 mmol/L (ref 22–32)
Chloride: 104 mmol/L (ref 101–111)
Creatinine, Ser: 0.99 mg/dL (ref 0.61–1.24)
GFR calc Af Amer: >60 mL/min (ref >60)
GLUCOSE: 95 mg/dL (ref 65–99)
POTASSIUM: 3.7 mmol/L (ref 3.5–5.1)
SODIUM: 138 mmol/L (ref 135–145)

## 2016-07-24 LAB — TROPONIN I
TROPONIN I: 0.06 ng/mL — AB (ref <0.03)
Troponin I: 0.06 ng/mL (ref <0.03)

## 2016-07-24 MED ORDER — PREDNISONE 20 MG PO TABS: 60.0000 mg | ORAL_TABLET | Freq: Once | ORAL | Status: DC

## 2016-07-24 MED ORDER — ALBUTEROL (5 MG/ML) CONTINUOUS INHALATION SOLN
10.0000 mg/h | INHALATION_SOLUTION | Freq: Once | RESPIRATORY_TRACT | Status: AC
  Administered 2016-07-24: 10 mg/h via RESPIRATORY_TRACT
  Filled 2016-07-24: qty 20

## 2016-07-24 MED ORDER — IPRATROPIUM BROMIDE 0.02 % IN SOLN
0.5000 mg | Freq: Once | RESPIRATORY_TRACT | Status: AC
  Administered 2016-07-24: 0.5 mg via RESPIRATORY_TRACT
  Filled 2016-07-24: qty 2.5

## 2016-07-24 MED ORDER — MAGNESIUM SULFATE 2 GM/50ML IV SOLN
2.0000 g | Freq: Once | INTRAVENOUS | Status: AC
  Administered 2016-07-24: 2 g via INTRAVENOUS
  Filled 2016-07-24: qty 50

## 2016-07-24 MED ORDER — PREDNISONE 20 MG PO TABS: 40.0000 mg | ORAL_TABLET | Freq: Every day | ORAL | 0 refills | Status: DC

## 2016-07-24 MED ORDER — ALBUTEROL SULFATE (2.5 MG/3ML) 0.083% IN NEBU
5.0000 mg | INHALATION_SOLUTION | Freq: Once | RESPIRATORY_TRACT | Status: AC
  Administered 2016-07-24: 5 mg via RESPIRATORY_TRACT
  Filled 2016-07-24: qty 6

## 2016-07-24 MED ORDER — METHYLPREDNISOLONE SODIUM SUCC 125 MG IJ SOLR
125.0000 mg | Freq: Once | INTRAMUSCULAR | Status: AC
  Administered 2016-07-24: 125 mg via INTRAVENOUS
  Filled 2016-07-24: qty 2

## 2016-07-24 MED ORDER — ALBUTEROL SULFATE HFA 108 (90 BASE) MCG/ACT IN AERS
2.0000 | INHALATION_SPRAY | RESPIRATORY_TRACT | Status: DC | PRN
  Administered 2016-07-24: 2 via RESPIRATORY_TRACT
  Filled 2016-07-24: qty 6.7

## 2016-07-24 NOTE — Discharge Instructions (Signed)
Please read and follow all provided instructions.  Your diagnoses today include:  1. Moderate asthma with exacerbation, unspecified whether persistent    Tests performed today include:  Troponin - unchanged over 5 hours  Chest x-ray - no pneumonia  Blood counts and electrolytes  Vital signs. See below for your results today.   Medications prescribed:   Albuterol inhaler - medication that opens up your airway  Use inhaler as follows: 1-2 puffs with spacer every 4 hours as needed for wheezing, cough, or shortness of breath.    Prednisone - steroid medicine   It is best to take this medication in the morning to prevent sleeping problems. If you are diabetic, monitor your blood sugar closely and stop taking Prednisone if blood sugar is over 300. Take with food to prevent stomach upset.   Take any prescribed medications only as directed.  Home care instructions:  Follow any educational materials contained in this packet.  Follow-up instructions: Please follow-up with your primary care provider in the next 3 days for further evaluation of your symptoms and management of your asthma.  Return instructions:   Please return to the Emergency Department if you experience worsening symptoms.  Please return with worsening wheezing, shortness of breath, or difficulty breathing.  Return with persistent fever above 101F.   Please return if you have any other emergent concerns.  Additional Information:  Your vital signs today were: BP 112/72    Pulse 96    Temp 97.7 F (36.5 C) (Oral)    Resp 16    SpO2 99%  If your blood pressure (BP) was elevated above 135/85 this visit, please have this repeated by your doctor within one month. --------------

## 2016-07-24 NOTE — Progress Notes (Signed)
CAT STARTED  

## 2016-07-24 NOTE — ED Provider Notes (Signed)
[facility]PT Provider Note   CSN: [id number] Arrival date & time: 07/23/16  2304     History   Chief Complaint Chief Complaint  Patient presents with  . Asthma    HPI Devin [name] is a 36 y.o. male.  Patient with history of asthma, recent visit to the emergency department on 07/17/16 for the same, presents with worsening shortness of breath today. Patient was discharged with albuterol inhaler and prednisone which he has been taking. Patient currently complain of wheezing and shortness of breath. No fevers, URI symptoms, chest pain, vomiting, or diarrhea. No lower extremity swelling or history of blood clots. The onset of this condition was acute. The course is constant. Aggravating factors: none. Alleviating factors: none.        Past Medical History:  Diagnosis Date  . ADHD (attention deficit hyperactivity disorder)   . Asthma   . GERD (gastroesophageal reflux disease)   . Transaminitis 07/17/2015    Patient Active Problem List   Diagnosis Date Noted  . Hypoxia   . COPD exacerbation (HCC)   . Leukocytosis 11/04/2015  . Asthma exacerbation 11/03/2015  . Transaminitis 07/17/2015  . Tooth ache 07/17/2015  . Status asthmaticus 06/08/2014  . Asthmatic bronchitis with exacerbation 06/08/2014  . Asthma with status asthmaticus 10/14/2011  . ADHD (attention deficit hyperactivity disorder) 10/14/2011    Past Surgical History:  Procedure Laterality Date  . TYMPANOPLASTY Left 1997       Home Medications    Prior to Admission medications   Medication Sig Start Date End Date Taking? Authorizing Provider  albuterol (PROVENTIL HFA;VENTOLIN HFA) 108 (90 Base) MCG/ACT inhaler Inhale 2 puffs into the lungs every 6 (six) hours as needed for wheezing or shortness of breath. Use 3 times daily for 4 days, then use as needed. Patient not taking: Reported on 02/05/2016 01/05/16   Quentin Angst, MD  albuterol (PROVENTIL) (2.5 MG/3ML) 0.083% nebulizer solution Take 3 mLs  (2.5 mg total) by nebulization every 6 (six) hours as needed for wheezing or shortness of breath. 07/20/15   [name], MD  ibuprofen (ADVIL,MOTRIN) 600 MG tablet Take 1 tablet (600 mg total) by mouth every 6 (six) hours as needed. Patient taking differently: Take 600 mg by mouth every 6 (six) hours as needed for moderate pain.  05/30/16   Barrett Henle, PA-C  oxyCODONE-acetaminophen (PERCOCET/ROXICET) 5-325 MG tablet Take 1 tablet by mouth every 4 (four) hours as needed for severe pain. 05/30/16   Barrett Henle, PA-C  predniSONE (DELTASONE) 20 MG tablet Take 2 tablets (40 mg total) by mouth daily. 07/18/16   Arthor Captain, PA-C    Family History Family History  Problem Relation Age of Onset  . Coronary artery disease Father   . Heart attack Father   . Heart disease Father   . Heart failure Mother   . Hyperlipidemia Mother   . Hypertension Mother   . Migraines Mother   . COPD Mother   . Diabetes Maternal Aunt   . Asthma Maternal Grandfather   . Asthma Paternal Grandfather     Social History Social History  Substance Use Topics  . Smoking status: Former Smoker    Packs/day: 0.25    Years: 16.00    Types: Cigarettes    Quit date: 06/21/2014  . Smokeless tobacco: Never Used  . Alcohol use 0.0 oz/week     Comment: 7/14//2017 "I'll drink 2-3 beers watching football/social events"     Allergies   Amoxicillin-pot clavulanate and Other  Review of Systems Review of Systems  Constitutional: Negative for diaphoresis and fever.  Eyes: Negative for redness.  Respiratory: Positive for wheezing. Negative for cough and shortness of breath.   Cardiovascular: Negative for chest pain, palpitations and leg swelling.  Gastrointestinal: Negative for abdominal pain, nausea and vomiting.  Genitourinary: Negative for dysuria.  Musculoskeletal: Negative for back pain and neck pain.  Skin: Negative for rash.  Neurological: Negative for syncope and light-headedness.    Psychiatric/Behavioral: The patient is not nervous/anxious.      Physical Exam Updated Vital Signs BP (!) 129/92   Pulse (!) 101   Temp 97.7 F (36.5 C) (Oral)   Resp (!) 26   SpO2 100%   Physical Exam  Constitutional: He appears well-developed and well-nourished.  HENT:  Head: Normocephalic and atraumatic.  Mouth/Throat: Oropharynx is clear and moist.  Eyes: Conjunctivae are normal. Right eye exhibits no discharge. Left eye exhibits no discharge.  Neck: Normal range of motion. Neck supple.  Cardiovascular: Normal rate, regular rhythm and normal heart sounds.   Pulmonary/Chest: Effort normal. No respiratory distress. He has decreased breath sounds. He has no wheezes. He has no rales.  Abdominal: Soft. There is no tenderness.  Neurological: He is alert.  Skin: Skin is warm and dry.  Psychiatric: He has a normal mood and affect.  Nursing note and vitals reviewed.    ED Treatments / Results  Labs (all labs ordered are listed, but only abnormal results are displayed) Labs Reviewed  CBC - Abnormal; Notable for the following:       Result Value   WBC 13.6 (*)    Platelets 414 (*)    All other components within normal limits  TROPONIN I - Abnormal; Notable for the following:    Troponin I 0.06 (*)    All other components within normal limits  TROPONIN I - Abnormal; Notable for the following:    Troponin I 0.06 (*)    All other components within normal limits  BASIC METABOLIC PANEL      Radiology Dg Chest 2 View  Result Date: 07/23/2016 CLINICAL DATA:  36 y/o  M; shortness of breath and chest tightness. EXAM: CHEST  2 VIEW COMPARISON:  07/18/2016 chest radiograph FINDINGS: Stable heart size and mediastinal contours are within normal limits. Both lungs are clear. The visualized skeletal structures are unremarkable. IMPRESSION: No active cardiopulmonary disease. Electronically Signed   By: Mitzi Hansen M.D.   On: 07/23/2016 23:44    Procedures Procedures  (including critical care time)  Medications Ordered in ED Medications  albuterol (PROVENTIL HFA;VENTOLIN HFA) 108 (90 Base) MCG/ACT inhaler 2 puff (not administered)  albuterol (PROVENTIL) (2.5 MG/3ML) 0.083% nebulizer solution 5 mg (5 mg Nebulization Given 07/23/16 2314)  albuterol (PROVENTIL) (2.5 MG/3ML) 0.083% nebulizer solution 5 mg (5 mg Nebulization Given 07/24/16 0128)  ipratropium (ATROVENT) nebulizer solution 0.5 mg (0.5 mg Nebulization Given 07/24/16 0128)  magnesium sulfate IVPB 2 g 50 mL (0 g Intravenous Stopped 07/24/16 0228)  methylPREDNISolone sodium succinate (SOLU-MEDROL) 125 mg/2 mL injection 125 mg (125 mg Intravenous Given 07/24/16 0128)  albuterol (PROVENTIL,VENTOLIN) solution continuous neb (10 mg/hr Nebulization Given 07/24/16 0313)     Initial Impression / Assessment and Plan / ED Course  I have reviewed the triage vital signs and the nursing notes.  Pertinent labs & imaging results that were available during my care of the patient were reviewed by me and considered in my medical decision making (see chart for details).     ED  ECG REPORT   Date: 07/24/2016  Rate: 104  Rhythm: sinus tachycardia  QRS Axis: normal  Intervals: normal  ST/T Wave abnormalities: normal  Conduction Disutrbances:none  Narrative Interpretation:   Old EKG Reviewed: changes noted, faster  I have personally reviewed the EKG tracing and agree with the computerized printout as noted.  Patient seen and examined. Troponin noted to be 0.06. He will need this repeated. His symptoms today seem to be typical for his asthma flares and I have low suspicion for ACS, PE.     Vital signs reviewed and are as follows: BP (!) 129/92   Pulse (!) 101   Temp 97.7 F (36.5 C) (Oral)   Resp (!) 26   SpO2 100%   4:34 AM Delay in obtaining delta trop as the initial draw was lost and lab does not have. It was redrawn.   On re-exam, wheezing is mild. Pt is not wheezing while he is asleep. Upon waking he  resumes upper airway wheezing. CAT is continuing.   5:18 AM EKGs are unchanged (reviewed by Dr. Wilkie Aye). Troponins are flat at 0.06. Discussed with Dr. Wilkie Aye. Patient is clinically improved. No hypoxia or tachypnea. Will discharge. Will provide albuterol inhaler and extend course of prednisone at home.  Encouraged PCP follow-up for long-term control of asthma. Referrals given.  Encouraged return to the emergency department with worsening symptoms, increasing shortness of breath, increased work of breathing, fevers, new symptoms or other concerns. Patient verbalizes understanding and agrees with plan.   Final Clinical Impressions(s) / ED Diagnoses   Final diagnoses:  Moderate asthma with exacerbation, unspecified whether persistent   Patient with long-standing asthma, presents with continued symptoms. No chest pain, leg swelling. Troponin was checked by nursing protocol and found to be 0.06. Patient was treated in emergency department magnesium, albuterol, Solu-Medrol. He is objectively improved. Troponin is stable at 0.06 over greater than 5 hours. Do not suspect ACS, myocarditis, pericarditis. No pneumonia on chest x-ray. Will continue treatment as above. Do not feel that his symptoms are severe enough to warrant admission to the hospital at this time. He appears well.   New Prescriptions New Prescriptions   PREDNISONE (DELTASONE) 20 MG TABLET    Take 2 tablets (40 mg total) by mouth daily.     Renne Crigler, PA-C 07/24/16 0522    Shon Baton, MD 07/24/16 [phone]

## 2016-07-24 NOTE — ED Notes (Signed)
Pt demonstrated appropriate use of albuterol inhaler. 

## 2016-07-24 NOTE — ED Notes (Signed)
Positive troponin called to this RN.  Patient moved to Room D31

## 2016-07-24 NOTE — ED Notes (Signed)
Spoke with lab re: troponin result. Per main lab they have not received a sample. Spoke with Dorathy Daft in mini lab who states that she drew it and sent it down. Kayla to redraw specimen.

## 2016-09-04 ENCOUNTER — Emergency Department (HOSPITAL_COMMUNITY)
Admission: EM | Admit: 2016-09-04 | Discharge: 2016-09-04 | Disposition: A | Discharge status: Home / Self Care | Payer: Self-pay | Attending: Emergency Medicine | Admitting: Emergency Medicine

## 2016-09-04 ENCOUNTER — Encounter (HOSPITAL_COMMUNITY): Payer: Self-pay | Admitting: *Deleted

## 2016-09-04 ENCOUNTER — Emergency Department (HOSPITAL_COMMUNITY): Payer: Self-pay

## 2016-09-04 DIAGNOSIS — W2101XA Struck by football, initial encounter: Secondary | ICD-10-CM | POA: Insufficient documentation

## 2016-09-04 DIAGNOSIS — F909 Attention-deficit hyperactivity disorder, unspecified type: Secondary | ICD-10-CM | POA: Insufficient documentation

## 2016-09-04 DIAGNOSIS — Z87891 Personal history of nicotine dependence: Secondary | ICD-10-CM | POA: Insufficient documentation

## 2016-09-04 DIAGNOSIS — S6992XA Unspecified injury of left wrist, hand and finger(s), initial encounter: Secondary | ICD-10-CM | POA: Insufficient documentation

## 2016-09-04 DIAGNOSIS — J45909 Unspecified asthma, uncomplicated: Secondary | ICD-10-CM | POA: Insufficient documentation

## 2016-09-04 DIAGNOSIS — Y9361 Activity, american tackle football: Secondary | ICD-10-CM | POA: Insufficient documentation

## 2016-09-04 DIAGNOSIS — Y929 Unspecified place or not applicable: Secondary | ICD-10-CM | POA: Insufficient documentation

## 2016-09-04 DIAGNOSIS — S6990XA Unspecified injury of unspecified wrist, hand and finger(s), initial encounter: Secondary | ICD-10-CM

## 2016-09-04 DIAGNOSIS — Y999 Unspecified external cause status: Secondary | ICD-10-CM | POA: Insufficient documentation

## 2016-09-04 MED ORDER — HYDROCODONE-ACETAMINOPHEN 5-325 MG PO TABS
1.0000 | ORAL_TABLET | Freq: Once | ORAL | Status: AC
  Administered 2016-09-04: 1 via ORAL
  Filled 2016-09-04: qty 1

## 2016-09-04 MED ORDER — IPRATROPIUM-ALBUTEROL 0.5-2.5 (3) MG/3ML IN SOLN
3.0000 mL | Freq: Once | RESPIRATORY_TRACT | Status: AC
  Administered 2016-09-04: 3 mL via RESPIRATORY_TRACT
  Filled 2016-09-04: qty 3

## 2016-09-04 MED ORDER — MELOXICAM 15 MG PO TABS: 15.0000 mg | ORAL_TABLET | Freq: Every day | ORAL | 0 refills | Status: DC

## 2016-09-04 MED ORDER — BUDESONIDE-FORMOTEROL FUMARATE 80-4.5 MCG/ACT IN AERO: 2.0000 | INHALATION_SPRAY | Freq: Every day | RESPIRATORY_TRACT | 12 refills | Status: DC

## 2016-09-04 MED ORDER — ALBUTEROL SULFATE (2.5 MG/3ML) 0.083% IN NEBU: 2.5000 mg | INHALATION_SOLUTION | Freq: Four times a day (QID) | RESPIRATORY_TRACT | 12 refills | Status: DC | PRN

## 2016-09-04 MED ORDER — PREDNISONE 20 MG PO TABS
60.0000 mg | ORAL_TABLET | Freq: Once | ORAL | Status: AC
  Administered 2016-09-04: 60 mg via ORAL
  Filled 2016-09-04: qty 3

## 2016-09-04 NOTE — ED Provider Notes (Signed)
[facility] Provider Note   CSN: [id number] Arrival date & time: 09/04/16  1038 By signing my name below, I, Bridgette HabermannMaria Tan, attest that this documentation has been prepared under the direction and in the presence of Arthor CaptainAbigail Dillian Feig, PA-C. Electronically Signed: Bridgette HabermannMaria Tan, ED Scribe. 09/04/16. 11:31 AM.  History   Chief Complaint Chief Complaint  Patient presents with  . Finger Injury  . Asthma    HPI The history is provided by the patient. No language interpreter was used.   HPI Comments: Devin [name] is a 36 y.o. male with h/o asthma, who presents to the Emergency Department by EMS complaining of left index finger pain with swelling following mechanical injury last night. Pt states he was playing football and he was trying to catch the ball when it directly struck his finger. Pain is exacerbated with movement of the finger. He has taken Ibuprofen this morning with no relief to his symptoms. He denies any additional injuries.  Pt is also complaining of throat tightness and wheezing beginning just PTA. Pt reports h/o asthma and states he has been out of his albuterol-nebulizer solution he is regularly prescribed. Pt does not have a PCP at this time. Pt denies fever, chills, or any other associated symptoms.  Past Medical History:  Diagnosis Date  . ADHD (attention deficit hyperactivity disorder)   . Asthma   . GERD (gastroesophageal reflux disease)   . Transaminitis 07/17/2015    Patient Active Problem List   Diagnosis Date Noted  . Hypoxia   . COPD exacerbation (HCC)   . Leukocytosis 11/04/2015  . Asthma exacerbation 11/03/2015  . Transaminitis 07/17/2015  . Tooth ache 07/17/2015  . Status asthmaticus 06/08/2014  . Asthmatic bronchitis with exacerbation 06/08/2014  . Asthma with status asthmaticus 10/14/2011  . ADHD (attention deficit hyperactivity disorder) 10/14/2011    Past Surgical History:  Procedure Laterality Date  . TYMPANOPLASTY Left 1997       Home  Medications    Prior to Admission medications   Medication Sig Start Date End Date Taking? Authorizing Provider  albuterol (PROVENTIL) (2.5 MG/3ML) 0.083% nebulizer solution Take 3 mLs (2.5 mg total) by nebulization every 6 (six) hours as needed for wheezing or shortness of breath. 07/20/15   Jeralyn BennettZamora, Ezequiel, MD  ibuprofen (ADVIL,MOTRIN) 600 MG tablet Take 1 tablet (600 mg total) by mouth every 6 (six) hours as needed. Patient taking differently: Take 600 mg by mouth every 6 (six) hours as needed for moderate pain.  05/30/16   Barrett HenleNadeau, Nicole Elizabeth, PA-C  oxyCODONE-acetaminophen (PERCOCET/ROXICET) 5-325 MG tablet Take 1 tablet by mouth every 4 (four) hours as needed for severe pain. 05/30/16   Barrett HenleNadeau, Nicole Elizabeth, PA-C  predniSONE (DELTASONE) 20 MG tablet Take 2 tablets (40 mg total) by mouth daily. 07/24/16   Renne CriglerGeiple, Joshua, PA-C    Family History Family History  Problem Relation Age of Onset  . Coronary artery disease Father   . Heart attack Father   . Heart disease Father   . Heart failure Mother   . Hyperlipidemia Mother   . Hypertension Mother   . Migraines Mother   . COPD Mother   . Diabetes Maternal Aunt   . Asthma Maternal Grandfather   . Asthma Paternal Grandfather     Social History Social History  Substance Use Topics  . Smoking status: Former Smoker    Packs/day: 0.25    Years: 16.00    Types: Cigarettes    Quit date: 06/21/2014  . Smokeless tobacco: Never Used  .  Alcohol use 0.0 oz/week     Comment: 7/14//2017 "I'll drink 2-3 beers watching football/social events"     Allergies   Amoxicillin-pot clavulanate and Other   Review of Systems Review of Systems  Constitutional: Negative for chills and fever.  Respiratory: Positive for wheezing.   Musculoskeletal: Positive for arthralgias and joint swelling.     Physical Exam Updated Vital Signs BP 122/87 (BP Location: Right Arm)   Pulse 96   Temp 98 F (36.7 C) (Oral)   Resp 18   SpO2 98%   Physical  Exam  Constitutional: He appears well-developed and well-nourished.  HENT:  Head: Normocephalic.  Eyes: Conjunctivae are normal.  Cardiovascular: Normal rate.   Pulmonary/Chest: Effort normal. No respiratory distress. He has wheezes.  Abdominal: He exhibits no distension.  Musculoskeletal: Normal range of motion. He exhibits edema and tenderness.  Left index finger is markedly swollen with bruising at the proximal finger. Difficulty moving any of the joints secondary to pain but appears to be able to move the MCP, PIP, and DIP independently.  Neurological: He is alert.  Skin: Skin is warm and dry.  Psychiatric: He has a normal mood and affect. His behavior is normal.  Nursing note and vitals reviewed.   ED Treatments / Results  DIAGNOSTIC STUDIES: Oxygen Saturation is 98% on RA, normal by my interpretation.   COORDINATION OF CARE: 11:31 AM-Discussed next steps with pt. Pt verbalized understanding and is agreeable with the plan.   Labs (all labs ordered are listed, but only abnormal results are displayed) Labs Reviewed - No data to display  EKG  EKG Interpretation None       Radiology No results found.  Procedures Procedures (including critical care time)  Medications Ordered in ED Medications  HYDROcodone-acetaminophen (NORCO/VICODIN) 5-325 MG per tablet 1 tablet (not administered)  predniSONE (DELTASONE) tablet 60 mg (not administered)  ipratropium-albuterol (DUONEB) 0.5-2.5 (3) MG/3ML nebulizer solution 3 mL (not administered)     Initial Impression / Assessment and Plan / ED Course  I have reviewed the triage vital signs and the nursing notes.  Pertinent labs & imaging results that were available during my care of the patient were reviewed by me and considered in my medical decision making (see chart for details).  Clinical Course as of Sep 05 1311  Wed Sep 04, 2016  1225 Patient updated on x-ray film results. Lungs are improved. No wheezes.     [AH]      Clinical Course User Index [AH] Arthor Captain, PA-C    Patient will Be discharged with a finger splint, follow up with hand, and refill on his albuterol nebulizer treatment. The patient appears safe for discharge at this time. I discussed return precautions.  Final Clinical Impressions(s) / ED Diagnoses   Final diagnoses:  None    New Prescriptions New Prescriptions   No medications on file   I personally performed the services described in this documentation, which was scribed in my presence. The recorded information has been reviewed and is accurate.        Arthor Captain, PA-C 09/04/16 1315    Jacalyn Lefevre, MD 09/04/16 [phone]

## 2016-09-04 NOTE — ED Notes (Signed)
Patient transported to X-ray 

## 2016-09-04 NOTE — ED Notes (Signed)
Patient given snack per EDP approval

## 2016-09-04 NOTE — ED Triage Notes (Signed)
Pt arrived by [facility]. Pt called 911 due to asthma, wheezing and reports being out of his inhaler and meds. spo2 98% at triage. Also reports injuring left index finger last night playing football.

## 2016-10-01 ENCOUNTER — Emergency Department (HOSPITAL_COMMUNITY): Payer: Self-pay

## 2016-10-01 ENCOUNTER — Emergency Department (HOSPITAL_COMMUNITY)
Admission: EM | Admit: 2016-10-01 | Discharge: 2016-10-01 | Disposition: A | Discharge status: Home / Self Care | Payer: Self-pay | Attending: Emergency Medicine | Admitting: Emergency Medicine

## 2016-10-01 ENCOUNTER — Encounter (HOSPITAL_COMMUNITY): Payer: Self-pay

## 2016-10-01 DIAGNOSIS — Z87891 Personal history of nicotine dependence: Secondary | ICD-10-CM | POA: Insufficient documentation

## 2016-10-01 DIAGNOSIS — F909 Attention-deficit hyperactivity disorder, unspecified type: Secondary | ICD-10-CM | POA: Insufficient documentation

## 2016-10-01 DIAGNOSIS — J449 Chronic obstructive pulmonary disease, unspecified: Secondary | ICD-10-CM | POA: Insufficient documentation

## 2016-10-01 DIAGNOSIS — J4521 Mild intermittent asthma with (acute) exacerbation: Secondary | ICD-10-CM | POA: Insufficient documentation

## 2016-10-01 MED ORDER — ALBUTEROL SULFATE HFA 108 (90 BASE) MCG/ACT IN AERS
1.0000 | INHALATION_SPRAY | RESPIRATORY_TRACT | Status: DC | PRN
  Administered 2016-10-01: 2 via RESPIRATORY_TRACT
  Filled 2016-10-01: qty 6.7

## 2016-10-01 MED ORDER — ALBUTEROL SULFATE (2.5 MG/3ML) 0.083% IN NEBU
INHALATION_SOLUTION | RESPIRATORY_TRACT | Status: AC
  Filled 2016-10-01: qty 6

## 2016-10-01 MED ORDER — ALBUTEROL SULFATE (2.5 MG/3ML) 0.083% IN NEBU
5.0000 mg | INHALATION_SOLUTION | Freq: Once | RESPIRATORY_TRACT | Status: AC
  Administered 2016-10-01: 5 mg via RESPIRATORY_TRACT

## 2016-10-01 NOTE — Discharge Instructions (Signed)
Establish care with PCP Return for worsening symptoms

## 2016-10-01 NOTE — ED Triage Notes (Signed)
Pt is coming from home with complaints of increased SOB since last night secondary to asthma. Pt has been with his mom upstairs while she has been admitted. Pt reports starting to feel SOB lat night while in the hospital and not having any breathing treatments or inhalers at this time.

## 2016-10-01 NOTE — ED Provider Notes (Signed)
[facility] Provider Note   CSN: [id number] Arrival date & time: 10/01/16  0747     History   Chief Complaint Chief Complaint  Patient presents with  . Asthma    HPI Devin [name] is a 36 y.o. male who presents with wheezing/SOB. PMH significant for asthma. He states that he has been out of his nebulizer and inhaler for several days and has been going back and forth from the hospital to his apartment because his mom is in the ICU. His usual triggers for asthma are seasonal changes and pollen. He does not currently have a PCP or insurance therefore is unable to afford an inhaler. He denies fever, chills, chest pain, cough. He has shortness of breath and wheezing which has resolved after breathing treatment in the ED.  HPI  Past Medical History:  Diagnosis Date  . ADHD (attention deficit hyperactivity disorder)   . Asthma   . GERD (gastroesophageal reflux disease)   . Transaminitis 07/17/2015    Patient Active Problem List   Diagnosis Date Noted  . Hypoxia   . COPD exacerbation (HCC)   . Leukocytosis 11/04/2015  . Transaminitis 07/17/2015  . Tooth ache 07/17/2015  . Asthma with status asthmaticus 10/14/2011  . ADHD (attention deficit hyperactivity disorder) 10/14/2011    Past Surgical History:  Procedure Laterality Date  . TYMPANOPLASTY Left 1997       Home Medications    Prior to Admission medications   Medication Sig Start Date End Date Taking? Authorizing Provider  albuterol (PROVENTIL) (2.5 MG/3ML) 0.083% nebulizer solution Take 3 mLs (2.5 mg total) by nebulization every 6 (six) hours as needed for wheezing or shortness of breath. 09/04/16   Arthor Captain, PA-C  budesonide-formoterol (SYMBICORT) 80-4.5 MCG/ACT inhaler Inhale 2 puffs into the lungs daily. 09/04/16   Arthor Captain, PA-C  ibuprofen (ADVIL,MOTRIN) 600 MG tablet Take 1 tablet (600 mg total) by mouth every 6 (six) hours as needed. Patient taking differently: Take 600 mg by mouth every 6 (six)  hours as needed for moderate pain.  05/30/16   Barrett Henle, PA-C  meloxicam (MOBIC) 15 MG tablet Take 1 tablet (15 mg total) by mouth daily. Take 1 daily with food. 09/04/16   Arthor Captain, PA-C  oxyCODONE-acetaminophen (PERCOCET/ROXICET) 5-325 MG tablet Take 1 tablet by mouth every 4 (four) hours as needed for severe pain. 05/30/16   Barrett Henle, PA-C  predniSONE (DELTASONE) 20 MG tablet Take 2 tablets (40 mg total) by mouth daily. 07/24/16   Renne Crigler, PA-C    Family History Family History  Problem Relation Age of Onset  . Coronary artery disease Father   . Heart attack Father   . Heart disease Father   . Heart failure Mother   . Hyperlipidemia Mother   . Hypertension Mother   . Migraines Mother   . COPD Mother   . Diabetes Maternal Aunt   . Asthma Maternal Grandfather   . Asthma Paternal Grandfather     Social History Social History  Substance Use Topics  . Smoking status: Former Smoker    Packs/day: 0.25    Years: 16.00    Types: Cigarettes    Quit date: 06/21/2014  . Smokeless tobacco: Never Used  . Alcohol use 0.0 oz/week     Comment: 7/14//2017 "I'll drink 2-3 beers watching football/social events"     Allergies   Amoxicillin-pot clavulanate and Other   Review of Systems Review of Systems  Constitutional: Negative for chills and fever.  HENT: Positive  for sneezing.   Respiratory: Positive for shortness of breath and wheezing. Negative for cough.   Cardiovascular: Negative for chest pain.  Allergic/Immunologic: Positive for environmental allergies.  All other systems reviewed and are negative.    Physical Exam Updated Vital Signs BP 119/62   Pulse 76   Temp 98.1 F (36.7 C) (Oral)   Resp 18   Ht 5\' 9"  (1.753 m)   Wt 86.2 kg (190 lb)   SpO2 99%   BMI 28.06 kg/m   Physical Exam  Constitutional: He is oriented to person, place, and time. He appears well-developed and well-nourished. No distress.  HENT:  Head: Normocephalic  and atraumatic.  Eyes: Conjunctivae are normal. Pupils are equal, round, and reactive to light. Right eye exhibits no discharge. Left eye exhibits no discharge. No scleral icterus.  Neck: Normal range of motion.  Cardiovascular: Normal rate and regular rhythm.  Exam reveals no gallop and no friction rub.   No murmur heard. Pulmonary/Chest: Effort normal and breath sounds normal. No respiratory distress. He has no wheezes. He has no rales. He exhibits no tenderness.  Abdominal: He exhibits no distension.  Neurological: He is alert and oriented to person, place, and time.  Skin: Skin is warm and dry.  Psychiatric: He has a normal mood and affect. His behavior is normal.  Nursing note and vitals reviewed.    ED Treatments / Results  Labs (all labs ordered are listed, but only abnormal results are displayed) Labs Reviewed - No data to display  EKG  EKG Interpretation None       Radiology Dg Chest 2 View  Result Date: 10/01/2016 CLINICAL DATA:  Shortness of breath. EXAM: CHEST  2 VIEW COMPARISON:  Radiographs of July 23, 2016. FINDINGS: The heart size and mediastinal contours are within normal limits. Both lungs are clear. No pneumothorax or pleural effusion is noted. The visualized skeletal structures are unremarkable. IMPRESSION: No active cardiopulmonary disease. Electronically Signed   By: Lupita RaiderJames  Green Jr, M.D.   On: 10/01/2016 08:31    Procedures Procedures (including critical care time)  Medications Ordered in ED Medications  albuterol (PROVENTIL HFA;VENTOLIN HFA) 108 (90 Base) MCG/ACT inhaler 1-2 puff (not administered)  albuterol (PROVENTIL) (2.5 MG/3ML) 0.083% nebulizer solution 5 mg (5 mg Nebulization Given 10/01/16 0811)     Initial Impression / Assessment and Plan / ED Course  I have reviewed the triage vital signs and the nursing notes.  Pertinent labs & imaging results that were available during my care of the patient were reviewed by me and considered in my  medical decision making (see chart for details).  36 year old male with mild asthma exacerbation. Vital signs are normal and symptoms have resolved on my exam. He is not in any distress and lungs are clear to auscultation. His primary issue is lack of insurance and primary care provider. He states he is in the process of applying for Medicaid and is trying to get in with [facility] however he has lost his identification and is waiting for that to come in in the mail. I gave him a list of resources to local primary care providers. Will also give him a albuterol inhaler here. Do not feel symptoms warrant steroids at this point. Return precautions given.  Final Clinical Impressions(s) / ED Diagnoses   Final diagnoses:  Mild intermittent asthma with acute exacerbation    New Prescriptions New Prescriptions   No medications on file     Bethel BornGekas, Deakon Frix Marie, Cordelia Poche-C 10/01/16  1610    Nira Conn, MD 10/01/16 [phone]

## 2017-01-13 ENCOUNTER — Encounter (HOSPITAL_COMMUNITY): Payer: Self-pay | Admitting: *Deleted

## 2017-01-13 ENCOUNTER — Emergency Department (HOSPITAL_COMMUNITY)
Admission: EM | Admit: 2017-01-13 | Discharge: 2017-01-13 | Disposition: A | Discharge status: Home / Self Care | Payer: Self-pay | Attending: Emergency Medicine | Admitting: Emergency Medicine

## 2017-01-13 ENCOUNTER — Emergency Department (HOSPITAL_COMMUNITY): Payer: Self-pay

## 2017-01-13 DIAGNOSIS — R0602 Shortness of breath: Secondary | ICD-10-CM | POA: Insufficient documentation

## 2017-01-13 DIAGNOSIS — R079 Chest pain, unspecified: Secondary | ICD-10-CM | POA: Insufficient documentation

## 2017-01-13 LAB — BASIC METABOLIC PANEL
Anion gap: 10 (ref 5–15)
BUN: 11 mg/dL (ref 6–20)
CHLORIDE: 102 mmol/L (ref 101–111)
CO2: 20 mmol/L — ABNORMAL LOW (ref 22–32)
Calcium: 9.1 mg/dL (ref 8.9–10.3)
Creatinine, Ser: 1.24 mg/dL (ref 0.61–1.24)
GFR calc Af Amer: >60 mL/min (ref >60)
GFR calc non Af Amer: >60 mL/min (ref >60)
Glucose, Bld: 145 mg/dL — ABNORMAL HIGH (ref 65–99)
POTASSIUM: 3.7 mmol/L (ref 3.5–5.1)
SODIUM: 132 mmol/L — AB (ref 135–145)

## 2017-01-13 LAB — I-STAT TROPONIN, ED: Troponin i, poc: 0.01 ng/mL (ref 0.00–0.08)

## 2017-01-13 LAB — CBC
HEMATOCRIT: 46.8 % (ref 39.0–52.0)
HEMOGLOBIN: 16.5 g/dL (ref 13.0–17.0)
MCH: 30.5 pg (ref 26.0–34.0)
MCHC: 35.3 g/dL (ref 30.0–36.0)
MCV: 86.5 fL (ref 78.0–100.0)
Platelets: 402 10*3/uL — ABNORMAL HIGH (ref 150–400)
RBC: 5.41 MIL/uL (ref 4.22–5.81)
RDW: 13 % (ref 11.5–15.5)
WBC: 16.3 10*3/uL — AB (ref 4.0–10.5)

## 2017-01-13 NOTE — ED Triage Notes (Signed)
Pt is here with bilateral joint pain , chest tightness, and cough

## 2017-01-13 NOTE — ED Notes (Signed)
Called for patient's name x5 in waiting area with no answer

## 2017-01-13 NOTE — ED Notes (Signed)
Called Pt's name x3 with no answer

## 2017-01-13 NOTE — ED Triage Notes (Signed)
Arrived via ems for shortness of breath, seen last night and then called EMS back for chest tight, ankle pain, throat pain.  HR 113, T- 98.1, 123/92

## 2017-03-21 ENCOUNTER — Encounter (HOSPITAL_COMMUNITY): Payer: Self-pay | Admitting: *Deleted

## 2017-03-21 ENCOUNTER — Other Ambulatory Visit: Payer: Self-pay

## 2017-03-21 ENCOUNTER — Emergency Department (HOSPITAL_COMMUNITY)
Admission: EM | Admit: 2017-03-21 | Discharge: 2017-03-22 | Disposition: A | Discharge status: Home / Self Care | Payer: Self-pay | Attending: Emergency Medicine | Admitting: Emergency Medicine

## 2017-03-21 ENCOUNTER — Emergency Department (HOSPITAL_COMMUNITY)
Admission: EM | Admit: 2017-03-21 | Discharge: 2017-03-21 | Disposition: A | Discharge status: Home / Self Care | Payer: Self-pay | Attending: Emergency Medicine | Admitting: Emergency Medicine

## 2017-03-21 ENCOUNTER — Encounter (HOSPITAL_COMMUNITY): Payer: Self-pay | Admitting: Emergency Medicine

## 2017-03-21 ENCOUNTER — Emergency Department (HOSPITAL_COMMUNITY): Payer: Self-pay

## 2017-03-21 DIAGNOSIS — J45909 Unspecified asthma, uncomplicated: Secondary | ICD-10-CM | POA: Insufficient documentation

## 2017-03-21 DIAGNOSIS — J449 Chronic obstructive pulmonary disease, unspecified: Secondary | ICD-10-CM | POA: Insufficient documentation

## 2017-03-21 DIAGNOSIS — F1210 Cannabis abuse, uncomplicated: Secondary | ICD-10-CM | POA: Insufficient documentation

## 2017-03-21 DIAGNOSIS — F1414 Cocaine abuse with cocaine-induced mood disorder: Secondary | ICD-10-CM | POA: Insufficient documentation

## 2017-03-21 DIAGNOSIS — R079 Chest pain, unspecified: Secondary | ICD-10-CM | POA: Insufficient documentation

## 2017-03-21 DIAGNOSIS — R45851 Suicidal ideations: Secondary | ICD-10-CM | POA: Insufficient documentation

## 2017-03-21 DIAGNOSIS — F332 Major depressive disorder, recurrent severe without psychotic features: Secondary | ICD-10-CM | POA: Insufficient documentation

## 2017-03-21 DIAGNOSIS — J45901 Unspecified asthma with (acute) exacerbation: Secondary | ICD-10-CM | POA: Insufficient documentation

## 2017-03-21 DIAGNOSIS — R4589 Other symptoms and signs involving emotional state: Secondary | ICD-10-CM | POA: Insufficient documentation

## 2017-03-21 DIAGNOSIS — F17210 Nicotine dependence, cigarettes, uncomplicated: Secondary | ICD-10-CM | POA: Insufficient documentation

## 2017-03-21 DIAGNOSIS — R519 Headache, unspecified: Secondary | ICD-10-CM

## 2017-03-21 DIAGNOSIS — R05 Cough: Secondary | ICD-10-CM | POA: Insufficient documentation

## 2017-03-21 DIAGNOSIS — R51 Headache: Secondary | ICD-10-CM | POA: Insufficient documentation

## 2017-03-21 DIAGNOSIS — R0602 Shortness of breath: Secondary | ICD-10-CM | POA: Insufficient documentation

## 2017-03-21 DIAGNOSIS — F1410 Cocaine abuse, uncomplicated: Secondary | ICD-10-CM | POA: Insufficient documentation

## 2017-03-21 DIAGNOSIS — F909 Attention-deficit hyperactivity disorder, unspecified type: Secondary | ICD-10-CM | POA: Insufficient documentation

## 2017-03-21 LAB — I-STAT VENOUS BLOOD GAS, ED
Acid-base deficit: 2 mmol/L (ref 0.0–2.0)
BICARBONATE: 18.9 mmol/L — AB (ref 20.0–28.0)
O2 Saturation: 81 %
PCO2 VEN: 23.9 mmHg — AB (ref 44.0–60.0)
PH VEN: 7.508 — AB (ref 7.250–7.430)
PO2 VEN: 39 mmHg (ref 32.0–45.0)
TCO2: 20 mmol/L — ABNORMAL LOW (ref 22–32)

## 2017-03-21 LAB — RAPID URINE DRUG SCREEN, HOSP PERFORMED
Amphetamines: NOT DETECTED
BENZODIAZEPINES: NOT DETECTED
Barbiturates: NOT DETECTED
Cocaine: POSITIVE — AB
Opiates: NOT DETECTED
Tetrahydrocannabinol: POSITIVE — AB

## 2017-03-21 LAB — BASIC METABOLIC PANEL
ANION GAP: 16 — AB (ref 5–15)
BUN: 9 mg/dL (ref 6–20)
CO2: 16 mmol/L — ABNORMAL LOW (ref 22–32)
Calcium: 9.4 mg/dL (ref 8.9–10.3)
Chloride: 103 mmol/L (ref 101–111)
Creatinine, Ser: 1.19 mg/dL (ref 0.61–1.24)
GFR calc Af Amer: >60 mL/min (ref >60)
Glucose, Bld: 67 mg/dL (ref 65–99)
POTASSIUM: 3.4 mmol/L — AB (ref 3.5–5.1)
SODIUM: 135 mmol/L (ref 135–145)

## 2017-03-21 LAB — CBC WITH DIFFERENTIAL/PLATELET
BASOS ABS: 0 10*3/uL (ref 0.0–0.1)
Basophils Relative: 0 %
EOS PCT: 2 %
Eosinophils Absolute: 0.3 10*3/uL (ref 0.0–0.7)
HCT: 43.3 % (ref 39.0–52.0)
Hemoglobin: 15.4 g/dL (ref 13.0–17.0)
LYMPHS PCT: 17 %
Lymphs Abs: 2.5 10*3/uL (ref 0.7–4.0)
MCH: 31.4 pg (ref 26.0–34.0)
MCHC: 35.6 g/dL (ref 30.0–36.0)
MCV: 88.4 fL (ref 78.0–100.0)
Monocytes Absolute: 1.6 10*3/uL — ABNORMAL HIGH (ref 0.1–1.0)
Monocytes Relative: 11 %
Neutro Abs: 10.5 10*3/uL — ABNORMAL HIGH (ref 1.7–7.7)
Neutrophils Relative %: 70 %
PLATELETS: 414 10*3/uL — AB (ref 150–400)
RBC: 4.9 MIL/uL (ref 4.22–5.81)
RDW: 13 % (ref 11.5–15.5)
WBC: 14.9 10*3/uL — AB (ref 4.0–10.5)

## 2017-03-21 LAB — HEPATIC FUNCTION PANEL
ALBUMIN: 4 g/dL (ref 3.5–5.0)
ALT: 24 U/L (ref 17–63)
AST: 33 U/L (ref 15–41)
Alkaline Phosphatase: 66 U/L (ref 38–126)
Bilirubin, Direct: 0.2 mg/dL (ref 0.1–0.5)
Indirect Bilirubin: 0.6 mg/dL (ref 0.3–0.9)
TOTAL PROTEIN: 6.9 g/dL (ref 6.5–8.1)
Total Bilirubin: 0.8 mg/dL (ref 0.3–1.2)

## 2017-03-21 LAB — SALICYLATE LEVEL

## 2017-03-21 LAB — D-DIMER, QUANTITATIVE (NOT AT ARMC): D DIMER QUANT: 0.39 ug{FEU}/mL (ref 0.00–0.50)

## 2017-03-21 LAB — ETHANOL

## 2017-03-21 LAB — I-STAT TROPONIN, ED: TROPONIN I, POC: 0 ng/mL (ref 0.00–0.08)

## 2017-03-21 LAB — I-STAT CG4 LACTIC ACID, ED: Lactic Acid, Venous: 1.27 mmol/L (ref 0.5–1.9)

## 2017-03-21 LAB — ACETAMINOPHEN LEVEL: ACETAMINOPHEN (TYLENOL), SERUM: 11 ug/mL (ref 10–30)

## 2017-03-21 MED ORDER — ONDANSETRON HCL 4 MG/2ML IJ SOLN
4.0000 mg | Freq: Once | INTRAMUSCULAR | Status: AC
  Administered 2017-03-21: 4 mg via INTRAVENOUS
  Filled 2017-03-21: qty 2

## 2017-03-21 MED ORDER — DIPHENHYDRAMINE HCL 50 MG/ML IJ SOLN
25.0000 mg | Freq: Once | INTRAMUSCULAR | Status: DC
  Filled 2017-03-21: qty 1

## 2017-03-21 MED ORDER — SODIUM CHLORIDE 0.9 % IV BOLUS (SEPSIS)
500.0000 mL | Freq: Once | INTRAVENOUS | Status: AC
  Administered 2017-03-21: 500 mL via INTRAVENOUS

## 2017-03-21 MED ORDER — DIPHENHYDRAMINE HCL 50 MG/ML IJ SOLN
25.0000 mg | Freq: Once | INTRAMUSCULAR | Status: AC
  Administered 2017-03-21: 25 mg via INTRAVENOUS

## 2017-03-21 MED ORDER — HALOPERIDOL LACTATE 5 MG/ML IJ SOLN
2.5000 mg | Freq: Once | INTRAMUSCULAR | Status: AC
  Administered 2017-03-21: 2.5 mg via INTRAVENOUS
  Filled 2017-03-21: qty 1

## 2017-03-21 MED ORDER — KETOROLAC TROMETHAMINE 15 MG/ML IJ SOLN
15.0000 mg | Freq: Once | INTRAMUSCULAR | Status: AC
  Administered 2017-03-21: 15 mg via INTRAVENOUS
  Filled 2017-03-21: qty 1

## 2017-03-21 MED ORDER — MOMETASONE FURO-FORMOTEROL FUM 100-5 MCG/ACT IN AERO
2.0000 | INHALATION_SPRAY | Freq: Two times a day (BID) | RESPIRATORY_TRACT | Status: DC
  Administered 2017-03-21 – 2017-03-22 (×2): 2 via RESPIRATORY_TRACT
  Filled 2017-03-21: qty 8.8

## 2017-03-21 MED ORDER — ALBUTEROL SULFATE HFA 108 (90 BASE) MCG/ACT IN AERS: 1.0000 | INHALATION_SPRAY | Freq: Four times a day (QID) | RESPIRATORY_TRACT | 0 refills | Status: DC | PRN

## 2017-03-21 MED ORDER — METOCLOPRAMIDE HCL 5 MG/ML IJ SOLN
10.0000 mg | Freq: Once | INTRAMUSCULAR | Status: AC
  Administered 2017-03-21: 10 mg via INTRAVENOUS
  Filled 2017-03-21: qty 2

## 2017-03-21 MED ORDER — METHYLPREDNISOLONE SODIUM SUCC 125 MG IJ SOLR
125.0000 mg | Freq: Once | INTRAMUSCULAR | Status: AC
  Administered 2017-03-21: 125 mg via INTRAVENOUS
  Filled 2017-03-21: qty 2

## 2017-03-21 MED ORDER — ALBUTEROL SULFATE HFA 108 (90 BASE) MCG/ACT IN AERS: 1.0000 | INHALATION_SPRAY | Freq: Four times a day (QID) | RESPIRATORY_TRACT | Status: DC | PRN

## 2017-03-21 MED ORDER — ACETAMINOPHEN 325 MG PO TABS
650.0000 mg | ORAL_TABLET | Freq: Once | ORAL | Status: AC
  Administered 2017-03-21: 650 mg via ORAL
  Filled 2017-03-21: qty 2

## 2017-03-21 MED ORDER — IPRATROPIUM BROMIDE 0.02 % IN SOLN
0.5000 mg | Freq: Once | RESPIRATORY_TRACT | Status: AC
  Administered 2017-03-21: 0.5 mg via RESPIRATORY_TRACT
  Filled 2017-03-21: qty 2.5

## 2017-03-21 MED ORDER — IPRATROPIUM-ALBUTEROL 0.5-2.5 (3) MG/3ML IN SOLN
3.0000 mL | Freq: Once | RESPIRATORY_TRACT | Status: AC
  Administered 2017-03-21: 3 mL via RESPIRATORY_TRACT
  Filled 2017-03-21: qty 3

## 2017-03-21 MED ORDER — ALBUTEROL (5 MG/ML) CONTINUOUS INHALATION SOLN
15.0000 mg/h | INHALATION_SOLUTION | Freq: Once | RESPIRATORY_TRACT | Status: AC
  Administered 2017-03-21: 15 mg/h via RESPIRATORY_TRACT
  Filled 2017-03-21: qty 0.5

## 2017-03-21 MED ORDER — PREDNISONE 20 MG PO TABS: 60.0000 mg | ORAL_TABLET | Freq: Once | ORAL | Status: DC

## 2017-03-21 MED ORDER — MAGNESIUM SULFATE 2 GM/50ML IV SOLN
2.0000 g | Freq: Once | INTRAVENOUS | Status: AC
  Administered 2017-03-21: 2 g via INTRAVENOUS
  Filled 2017-03-21: qty 50

## 2017-03-21 MED ORDER — PREDNISONE 10 MG PO TABS: 30.0000 mg | ORAL_TABLET | Freq: Two times a day (BID) | ORAL | 0 refills | Status: AC

## 2017-03-21 NOTE — Discharge Instructions (Signed)
You were seen here today for symptoms related to your known Asthma.   Please follow-up with your doctor in regards to your asthma exacerbation in the next 3 days for further evaluation and management of your asthma. Read the instructions below to learn more about factors that can trigger asthma. Use your albuterol inhaler as directed. Your more than welcome to return to the emergency department if you become short of breath, your albuterol inhaler does not relieve symptoms, you are having chest pain associated with difficulty breathing, or any symptoms that are concerning to you.    IDENTIFY AND CONTROL FACTORS THAT MAKE YOUR ASTHMA WORSE A number of common things can set off or make your asthma symptoms worse (asthma triggers). Keep track of your asthma symptoms for several weeks, detailing all the environmental and emotional factors that are linked with your asthma. When you have an asthma attack, go back to your asthma diary to see which factor, or combination of factors, might have contributed to it. Once you know what these factors are, you can take steps to control many of them.  Allergies: If you have allergies and asthma, it is important to take asthma prevention steps at home. Asthma attacks (worsening of asthma symptoms) can be triggered by allergies, which can cause temporary increased inflammation of your airways. Minimizing contact with the substance to which you are allergic will help prevent an asthma attack. Animal Dander:  Some people are allergic to the flakes of skin or dried saliva from animals with fur or feathers. Keep these pets out of your home.  If you can't keep a pet outdoors, keep the pet out of your bedroom and other sleeping areas at all times, and keep the door closed.  Remove carpets and furniture covered with cloth from your home. If that is not possible, keep the pet away from fabric-covered furniture and carpets.  Dust Mites: Many people with asthma are allergic to  dust mites. Dust mites are tiny bugs that are found in every home, in mattresses, pillows, carpets, fabric-covered furniture, bedcovers, clothes, stuffed toys, fabric, and other fabric-covered items.  Cover your mattress in a special dust-proof cover.  Cover your pillow in a special dust-proof cover, or wash the pillow each week in hot water. Water must be hotter than 130 F to kill dust mites. Cold or warm water used with detergent and bleach can also be effective.  Wash the sheets and blankets on your bed each week in hot water.  Try not to sleep or lie on cloth-covered cushions.  Call ahead when traveling and ask for a smoke-free hotel room. Bring your own bedding and pillows, in case the hotel only supplies feather pillows and down comforters, which may contain dust mites and cause asthma symptoms.  Remove carpets from your bedroom and those laid on concrete, if you can.  Keep stuffed toys out of the bed, or wash the toys weekly in hot water or cooler water with detergent and bleach.  Cockroaches: Many people with asthma are allergic to the droppings and remains of cockroaches.  Keep food and garbage in closed containers. Never leave food out.  Use poison baits, traps, powders, gels, or paste (for example, boric acid).  If a spray is used to kill cockroaches, stay out of the room until the odor goes away.  Indoor Mold: Fix leaky faucets, pipes, or other sources of water that have mold around them.  Clean moldy surfaces with a cleaner that has bleach in it.  Pollen and Outdoor Mold: When pollen or mold spore counts are high, try to keep your windows closed.  Stay indoors with windows closed from late morning to afternoon, if you can. Pollen and some mold spore counts are highest at that time.  Ask your caregiver whether you need to take or increase anti-inflammatory medicine before your allergy season starts.  Irritants:  Tobacco smoke is an irritant. If you smoke, ask your caregiver how you  can quit. Ask family members to quit smoking, too. Do not allow smoking in your home or car.  If possible, do not use a wood-burning stove, kerosene heater, or fireplace. Minimize exposure to all sources of smoke, including incense, candles, fires, and fireworks.  Try to stay away from strong odors and sprays, such as perfume, talcum powder, hair spray, and paints.  Decrease humidity in your home and use an indoor air cleaning device. Reduce indoor humidity to below 60 percent. Dehumidifiers or central air conditioners can do this.  Try to have someone else vacuum for you once or twice a week, if you can. Stay out of rooms while they are being vacuumed and for a short while afterward.  If you vacuum, use a dust mask from a hardware store, a double-layered or microfilter vacuum cleaner bag, or a vacuum cleaner with a HEPA filter.  Sulfites in foods and beverages can be irritants. Do not drink beer or wine, or eat dried fruit, processed potatoes, or shrimp if they cause asthma symptoms.  Cold air can trigger an asthma attack. Cover your nose and mouth with a scarf on cold or windy days.  Several health conditions can make asthma more difficult to manage, including runny nose, sinus infections, reflux disease, psychological stress, and sleep apnea. Your caregiver will treat these conditions, as well.  Avoid close contact with people who have a cold or the flu, since your asthma symptoms may get worse if you catch the infection from them. Wash your hands thoroughly after touching items that may have been handled by people with a respiratory infection.  Get a flu shot every year to protect against the flu virus, which often makes asthma worse for days or weeks. Also get a pneumonia shot once every five to 10 years.  Drugs: Aspirin and other painkillers can cause asthma attacks. 10% to 20% of people with asthma have sensitivity to aspirin or a group of painkillers called non-steroidal anti-inflammatory drugs  (NSAIDS), such as ibuprofen and naproxen. These drugs are used to treat pain and reduce fevers. Asthma attacks caused by any of these medicines can be severe and even fatal. These drugs must be avoided in people who have known aspirin sensitive asthma. Products with acetaminophen are considered safe for people who have asthma. It is important that people with aspirin sensitivity read labels of all over-the-counter drugs used to treat pain, colds, coughs, and fever.  Beta blockers and ACE inhibitors are other drugs which you should discuss with your caregiver, in relation to your asthma.   EXERCISE  If you have exercise-induced asthma, or are planning vigorous exercise, or exercise in cold, humid, or dry environments, prevent exercise-induced asthma by following your caregiver's advice regarding asthma treatment before exercising. Additional Information:  You were also seen here today for depression. You were evaluated in the department.  I am providing outpatient resources that I would like you to follow-up with.   Your vital signs today were: BP 101/71    Pulse 79    Temp 97.7 F (  36.5 C) (Oral)    Resp 18    Ht 6' (1.829 m)    Wt 80.7 kg (178 lb)    SpO2 92%    BMI 24.14 kg/m  If your blood pressure (BP) was elevated above 135/85 this visit, please have this repeated by your doctor within one month. ---------------

## 2017-03-21 NOTE — ED Notes (Signed)
Pt refused to ambulate while checking his O2, stated he felt really dizzy and did not want to walk. Nurse was notified.

## 2017-03-21 NOTE — ED Notes (Signed)
Notified provider patient did not want to walk at this time and felt dizzy.  Ordered IV fluids.

## 2017-03-21 NOTE — ED Notes (Signed)
Pt stood up reporting dizziness and 8/10 headache. Pt did not ambulate anywhere. Pt used door to support himself while standing.

## 2017-03-21 NOTE — ED Notes (Signed)
ED Provider at bedside. 

## 2017-03-21 NOTE — ED Notes (Signed)
Patient developing nausea. Provider notified.
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
## 2017-03-21 NOTE — ED Notes (Signed)
Provider at bedside spoke with patient and he stated he is SI.

## 2017-03-21 NOTE — ED Notes (Signed)
TTS completed. 

## 2017-03-21 NOTE — ED Notes (Signed)
Pt ambulated while on pulse oximetry. O2 sats remained in the 96-98 range and HR between 100-110. Pt had strong and steady gait.

## 2017-03-21 NOTE — ED Notes (Signed)
Pt verbalizes understanding of d/c instructions. Pt received prescriptions. Pt ambulatory at d/c with all belongings.  

## 2017-03-21 NOTE — ED Triage Notes (Signed)
Arrived via EMS patient developed a productive cough green sputum one week ago and wheezing yesterday continued today.  EMS administered 5 mg albuterol prior to arrival patient stated some relief. Airway intact bilateral equal chest rise and fall.
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
## 2017-03-21 NOTE — ED Notes (Signed)
TTS called again and speaking with patient.

## 2017-03-21 NOTE — ED Notes (Signed)
TTS being completed at this time. ?

## 2017-03-21 NOTE — ED Notes (Signed)
Pt. Transferred to [facility] from ED to room 34 after screening for contraband. Report to include Situation, Background, Assessment and [facility]ations [facility]. Pt. Oriented to unit including Q15 minute rounds as well as the security cameras for their protection. Patient is alert and oriented, warm and dry in no acute distress. Patient denies SI, HI, and AVH. Pt. Encouraged to let me know if needs arise.

## 2017-03-21 NOTE — ED Notes (Signed)
Pt O2 read 75% on room air. Nurse was notified. Per Kathlene NovemberMike, RN pt was placed on 2L [place]. Pt's O2 is now reading 82%
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
## 2017-03-22 DIAGNOSIS — F1414 Cocaine abuse with cocaine-induced mood disorder: Secondary | ICD-10-CM | POA: Diagnosis present

## 2017-03-22 MED ORDER — GABAPENTIN 300 MG PO CAPS
300.0000 mg | ORAL_CAPSULE | Freq: Three times a day (TID) | ORAL | Status: DC
  Administered 2017-03-22: 300 mg via ORAL
  Filled 2017-03-22: qty 1

## 2017-03-22 MED ORDER — GABAPENTIN 300 MG PO CAPS: 300.0000 mg | ORAL_CAPSULE | Freq: Three times a day (TID) | ORAL | 0 refills | Status: DC

## 2017-03-22 NOTE — ED Notes (Signed)
Hourly rounding reveals patient in room. No complaints, stable, in no acute distress. Q15 minute rounds and monitoring via Security Cameras to continue. 
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
## 2017-03-22 NOTE — ED Notes (Signed)
Hourly rounding reveals patient sleeping in room. No complaints, stable, in no acute distress. Q15 minute rounds and monitoring via Security Cameras to continue. 

## 2017-03-22 NOTE — [facility] Suicide Risk Assessment (Signed)
Suicide Risk Assessment  Discharge Assessment   [facility]   Principal Problem: Cocaine abuse with cocaine-induced mood disorder [facility](HCC) Discharge Diagnoses:  Patient Active Problem List   Diagnosis Date Noted  . Cocaine abuse with cocaine-induced mood disorder [facility](HCC) [F14.14] 03/22/2017    Priority: High  . Hypoxia [R09.02]   . COPD exacerbation (HCC) [J44.1]   . Leukocytosis [D72.829] 11/04/2015  . Transaminitis [R74.0] 07/17/2015  . Tooth ache [K08.89] 07/17/2015  . Asthma with status asthmaticus [J45.902] 10/14/2011  . ADHD (attention deficit hyperactivity disorder) [F90.9] 10/14/2011    Total Time spent with patient: 45 minutes  Musculoskeletal: Strength & Muscle Tone: within normal limits Gait & Station: normal Patient leans: N/A  Psychiatric Specialty Exam:   Blood pressure 124/65, pulse 87, temperature 98.2 F (36.8 C), resp. rate 18, height 5\' 11"  (1.803 m), weight 80.7 kg (178 lb), SpO2 98 %.Body mass index is 24.83 kg/m.  General Appearance: Casual  Eye Contact::  Good  Speech:  Normal Rate409  Volume:  Normal  Mood:  Depressed, mild  Affect:  Congruent  Thought Process:  Coherent and Descriptions of Associations: Intact  Orientation:  Full (Time, Place, and Person)  Thought Content:  WDL and Logical  Suicidal Thoughts:  No  Homicidal Thoughts:  No  Memory:  Immediate;   Good Recent;   Good Remote;   Good  Judgement:  Fair  Insight:  Fair  Psychomotor Activity:  Normal  Concentration:  Good  Recall:  Good  Fund of Knowledge:Good  Language: Good  Akathisia:  No  Handed:  Right  AIMS (if indicated):     Assets:  Leisure Time Physical Health Resilience  Sleep:     Cognition: WNL  ADL's:  Intact   Mental Status Per Nursing Assessment::   On Admission:   Asthma exacerbation.  Passive suicidal ideations yesterday when he was thinking about acting out in front of his young daughters.  His wife told him to leave and he has  been homeless.  Today, he denies suicidal/homicidal ideations, hallucinations, and withdrawal symptoms.  Stable for discharge, meeting with Peer Consult prior to discharge.  Demographic Factors:  Male and Caucasian  Loss Factors: NA  Historical Factors: NA  Risk Reduction Factors:   Responsible for children under 34 years of age and Sense of responsibility to family  Continued Clinical Symptoms:  Depression, mild  Cognitive Features That Contribute To Risk:  None    Suicide Risk:  Minimal: No identifiable suicidal ideation.  Patients presenting with no risk factors but with morbid ruminations; may be classified as minimal risk based on the severity of the depressive symptoms    Plan Of Care/Follow-up recommendations:  Activity:  as tolerated Diet:  heart healthy diet  LORD, JAMISON, NP 03/22/2017, 11:16 AM

## 2017-03-22 NOTE — ED Notes (Signed)
Hourly rounding reveals patient in shower room. No complaints, stable, in no acute distress. Q15 minute rounds and monitoring via Security Cameras to continue.  

## 2017-03-22 NOTE — ED Notes (Signed)
[facility] Discharged instructions read to pt who verbalized understanding. All belongings returned to pt who signed for same. Denies SI/HI, is not delusional and not responding to internal stimuli. Escorted pt to the ED exit.
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
## 2017-04-13 ENCOUNTER — Encounter (HOSPITAL_COMMUNITY): Payer: Self-pay | Admitting: Emergency Medicine

## 2017-04-13 ENCOUNTER — Other Ambulatory Visit: Payer: Self-pay

## 2017-04-13 ENCOUNTER — Emergency Department (HOSPITAL_COMMUNITY): Payer: Self-pay

## 2017-04-13 ENCOUNTER — Emergency Department (HOSPITAL_COMMUNITY)
Admission: EM | Admit: 2017-04-13 | Discharge: 2017-04-13 | Disposition: A | Discharge status: Home / Self Care | Payer: Self-pay | Attending: Emergency Medicine | Admitting: Emergency Medicine

## 2017-04-13 DIAGNOSIS — F17210 Nicotine dependence, cigarettes, uncomplicated: Secondary | ICD-10-CM | POA: Insufficient documentation

## 2017-04-13 DIAGNOSIS — Z79899 Other long term (current) drug therapy: Secondary | ICD-10-CM | POA: Insufficient documentation

## 2017-04-13 DIAGNOSIS — J4521 Mild intermittent asthma with (acute) exacerbation: Secondary | ICD-10-CM | POA: Insufficient documentation

## 2017-04-13 DIAGNOSIS — J449 Chronic obstructive pulmonary disease, unspecified: Secondary | ICD-10-CM | POA: Insufficient documentation

## 2017-04-13 MED ORDER — IPRATROPIUM BROMIDE 0.02 % IN SOLN
1.0000 mg | Freq: Once | RESPIRATORY_TRACT | Status: AC
  Administered 2017-04-13: 1 mg via RESPIRATORY_TRACT
  Filled 2017-04-13: qty 5

## 2017-04-13 MED ORDER — ALBUTEROL SULFATE HFA 108 (90 BASE) MCG/ACT IN AERS
2.0000 | INHALATION_SPRAY | Freq: Once | RESPIRATORY_TRACT | Status: AC
  Administered 2017-04-13: 2 via RESPIRATORY_TRACT
  Filled 2017-04-13: qty 6.7

## 2017-04-13 MED ORDER — DEXAMETHASONE SODIUM PHOSPHATE 10 MG/ML IJ SOLN
10.0000 mg | Freq: Once | INTRAMUSCULAR | Status: AC
  Administered 2017-04-13: 10 mg via INTRAMUSCULAR
  Filled 2017-04-13: qty 1

## 2017-04-13 MED ORDER — ALBUTEROL (5 MG/ML) CONTINUOUS INHALATION SOLN
10.0000 mg/h | INHALATION_SOLUTION | Freq: Once | RESPIRATORY_TRACT | Status: AC
  Administered 2017-04-13: 10 mg/h via RESPIRATORY_TRACT
  Filled 2017-04-13: qty 20

## 2017-04-13 MED ORDER — ONDANSETRON 4 MG PO TBDP
4.0000 mg | ORAL_TABLET | Freq: Once | ORAL | Status: AC
  Administered 2017-04-13: 4 mg via ORAL
  Filled 2017-04-13: qty 1

## 2017-04-13 MED ORDER — LEVOFLOXACIN 750 MG PO TABS: 750.0000 mg | ORAL_TABLET | Freq: Every day | ORAL | 0 refills | Status: AC

## 2017-04-13 MED ORDER — PREDNISONE 20 MG PO TABS: 60.0000 mg | ORAL_TABLET | Freq: Every day | ORAL | 0 refills | Status: AC

## 2017-04-13 MED ORDER — ACETAMINOPHEN 500 MG PO TABS
1000.0000 mg | ORAL_TABLET | Freq: Once | ORAL | Status: AC
  Administered 2017-04-13: 1000 mg via ORAL
  Filled 2017-04-13: qty 2

## 2017-04-13 MED ORDER — ALBUTEROL SULFATE (2.5 MG/3ML) 0.083% IN NEBU
5.0000 mg | INHALATION_SOLUTION | Freq: Once | RESPIRATORY_TRACT | Status: AC
  Administered 2017-04-13: 5 mg via RESPIRATORY_TRACT

## 2017-04-13 MED ORDER — AEROCHAMBER PLUS FLO-VU MEDIUM MISC
1.0000 | Freq: Once | Status: AC
  Administered 2017-04-13: 1
  Filled 2017-04-13: qty 1

## 2017-04-13 MED ORDER — ALBUTEROL SULFATE (2.5 MG/3ML) 0.083% IN NEBU
INHALATION_SOLUTION | RESPIRATORY_TRACT | Status: AC
  Filled 2017-04-13: qty 6

## 2017-04-13 MED ORDER — LEVOFLOXACIN 750 MG PO TABS
750.0000 mg | ORAL_TABLET | Freq: Once | ORAL | Status: AC
  Administered 2017-04-13: 750 mg via ORAL
  Filled 2017-04-13: qty 1

## 2017-04-13 NOTE — ED Triage Notes (Signed)
Pt to ED from home c/o asthma starting a couple days ago, expiratory wheezes bilaterally. Pt reports he is out of his inhaler at home. Denies CP/cough/fevers.

## 2017-04-13 NOTE — ED Notes (Signed)
Walked pt around pod A pt stayed at 96% and above, pt now in bed resting and gave a Malawiturkey sandwich and a sprite.

## 2017-04-13 NOTE — ED Provider Notes (Signed)
[facility] EMERGENCY DEPARTMENT Provider Note   CSN: [id number] Arrival date & time: 04/13/17  1303     History   Chief Complaint Chief Complaint  Patient presents with  . Asthma    HPI Devin [name] is a 36 y.o. male.  HPI   36 year old male with past medical history as below including COPD and asthma, homelessness, who presents with cough and shortness of breath.  The patient states he is out of his albuterol inhaler.  Over the last several days, he is noticed a dry, hacking cough that is occasionally productive of sputum.  He has had associated body aches and chills.  Denies any known fevers but he does not have a thermometer.  Has had associated mild fatigue.  Denies any abdominal pain.  Has had some mild nausea but no active vomiting.  No diarrhea.  No chest pain.  He does have multiple sick contacts and currently lives on the streets.  He states he cannot currently get his medications because he does not have an ID.  Denies any alleviating factors.  His cough and shortness of breath worsened with exertion.  Past Medical History:  Diagnosis Date  . ADHD (attention deficit hyperactivity disorder)   . Asthma   . GERD (gastroesophageal reflux disease)   . Transaminitis 07/17/2015    Patient Active Problem List   Diagnosis Date Noted  . Cocaine abuse with cocaine-induced mood disorder (HCC) 03/22/2017  . Hypoxia   . COPD exacerbation (HCC)   . Leukocytosis 11/04/2015  . Transaminitis 07/17/2015  . Tooth ache 07/17/2015  . Asthma with status asthmaticus 10/14/2011  . ADHD (attention deficit hyperactivity disorder) 10/14/2011    Past Surgical History:  Procedure Laterality Date  . TYMPANOPLASTY Left 1997       Home Medications    Prior to Admission medications   Medication Sig Start Date End Date Taking? Authorizing Provider  albuterol (PROVENTIL HFA;VENTOLIN HFA) 108 (90 Base) MCG/ACT inhaler Inhale 1-2 puffs into the lungs every 6 (six) hours  as needed for wheezing or shortness of breath. 03/21/17  Yes Maczis, Elmer Sow, PA-C  albuterol (PROVENTIL) (2.5 MG/3ML) 0.083% nebulizer solution Take 3 mLs (2.5 mg total) by nebulization every 6 (six) hours as needed for wheezing or shortness of breath. 09/04/16  Yes Harris, Abigail, PA-C  Fluticasone Propionate, Inhal, (FLOVENT IN) Inhale 1 puff into the lungs daily as needed (wheezing).   Yes [provider]  gabapentin (NEURONTIN) 300 MG capsule Take 1 capsule (300 mg total) by mouth 3 (three) times daily. 03/22/17  Yes Charm Rings, NP  budesonide-formoterol (SYMBICORT) 80-4.5 MCG/ACT inhaler Inhale 2 puffs into the lungs daily. Patient not taking: Reported on 10/01/2016 09/04/16   Arthor Captain, PA-C  levofloxacin (LEVAQUIN) 750 MG tablet Take 1 tablet (750 mg total) by mouth daily for 5 days. 04/13/17 04/18/17  Shaune Pollack, MD  predniSONE (DELTASONE) 20 MG tablet Take 3 tablets (60 mg total) by mouth daily for 5 days. 04/13/17 04/18/17  Shaune Pollack, MD    Family History Family History  Problem Relation Age of Onset  . Coronary artery disease Father   . Heart attack Father   . Heart disease Father   . Heart failure Mother   . Hyperlipidemia Mother   . Hypertension Mother   . Migraines Mother   . COPD Mother   . Diabetes Maternal Aunt   . Asthma Maternal Grandfather   . Asthma Paternal Grandfather     Social History Social History  Tobacco Use  . Smoking status: Current Every Day Smoker    Packs/day: 0.25    Years: 16.00    Pack years: 4.00    Types: Cigarettes    Last attempt to quit: 06/21/2014    Years since quitting: 2.8  . Smokeless tobacco: Never Used  Substance Use Topics  . Alcohol use: Yes    Alcohol/week: 3.6 oz    Types: 6 Cans of beer per week  . Drug use: Yes    Types: Marijuana, Cocaine    Comment: Pt stated "Used x 2 days ago"     Allergies   Augmentin [amoxicillin-pot clavulanate] and Other   Review of Systems Review of  Systems  Constitutional: Positive for fatigue.  Respiratory: Positive for cough, shortness of breath and wheezing.   Musculoskeletal: Positive for arthralgias and myalgias.  All other systems reviewed and are negative.    Physical Exam Updated Vital Signs BP (!) 145/119 (BP Location: Right Arm)   Pulse 89   Temp 98.4 F (36.9 C) (Oral)   Resp 16   SpO2 98%   Physical Exam  Constitutional: He is oriented to person, place, and time. He appears well-developed and well-nourished. No distress.  HENT:  Head: Normocephalic and atraumatic.  Mouth/Throat: Oropharynx is clear and moist.  Poor dentition  Eyes: Conjunctivae are normal.  Neck: Neck supple.  Cardiovascular: Normal rate, regular rhythm and normal heart sounds. Exam reveals no friction rub.  No murmur heard. Pulmonary/Chest: No respiratory distress. He has wheezes. He has no rales.  Mild tachypnea with diffuse wheezing but overall normal aeration.  No retractions.  Abdominal: He exhibits no distension.  Musculoskeletal: He exhibits no edema.  Neurological: He is alert and oriented to person, place, and time. He exhibits normal muscle tone.  Skin: Skin is warm. Capillary refill takes less than 2 seconds.  Psychiatric: He has a normal mood and affect.  Nursing note and vitals reviewed.    ED Treatments / Results  Labs (all labs ordered are listed, but only abnormal results are displayed) Labs Reviewed - No data to display  EKG  EKG Interpretation None       Radiology Dg Chest 2 View  Result Date: 04/13/2017 CLINICAL DATA:  Chest tightness, wheezing and shortness of breath for 2-3 days. EXAM: CHEST  2 VIEW COMPARISON:  PA and lateral chest 03/21/2017 and 11/05/2015. FINDINGS: The lungs are clear. Heart size is normal. No pneumothorax or pleural effusion. No acute bony abnormality. IMPRESSION: Negative chest. Electronically Signed   By: Drusilla Kannerhomas  Dalessio M.D.   On: 04/13/2017 14:54    Procedures Procedures  (including critical care time)  Medications Ordered in ED Medications  albuterol (PROVENTIL) (2.5 MG/3ML) 0.083% nebulizer solution (  Not Given 04/13/17 1521)  AEROCHAMBER PLUS FLO-VU MEDIUM MISC 1 each (not administered)  albuterol (PROVENTIL) (2.5 MG/3ML) 0.083% nebulizer solution 5 mg (5 mg Nebulization Given 04/13/17 1322)  albuterol (PROVENTIL,VENTOLIN) solution continuous neb (10 mg/hr Nebulization Given by Other 04/13/17 1449)  ipratropium (ATROVENT) nebulizer solution 1 mg (1 mg Nebulization Given by Other 04/13/17 1449)  dexamethasone (DECADRON) injection 10 mg (10 mg Intramuscular Given 04/13/17 1448)  acetaminophen (TYLENOL) tablet 1,000 mg (1,000 mg Oral Given 04/13/17 1643)  ondansetron (ZOFRAN-ODT) disintegrating tablet 4 mg (4 mg Oral Given 04/13/17 1643)  levofloxacin (LEVAQUIN) tablet 750 mg (750 mg Oral Given 04/13/17 1643)  albuterol (PROVENTIL HFA;VENTOLIN HFA) 108 (90 Base) MCG/ACT inhaler 2 puff (2 puffs Inhalation Given 04/13/17 1643)     Initial Impression /  Assessment and Plan / ED Course  I have reviewed the triage vital signs and the nursing notes.  Pertinent labs & imaging results that were available during my care of the patient were reviewed by me and considered in my medical decision making (see chart for details).     36 year old male with past medical history as above here with cough, chills, and occasional sputum production.  He is diffusely wheezing on arrival.  Suspect he has acute asthma versus COPD exacerbation, possibly with concomitant viral illness given his myalgias.  He is afebrile and satting well on room air with no signs of sepsis at this time.  He is able to ambulate in the ED with no hypoxia.  Patient given a continuous breathing treatment as well as IM Decadron and a dose of Levaquin.  Patient also given a Malawiturkey sandwich and tolerated p.o. without difficulty.  His lung sounds are significantly better after breathing treatment and he is able  to ambulate without dyspnea.  I have provided him with an inhaler here and I discussed the case with Burna MortimerWanda, case management, who [facility] will be by and they will provide him with his prescriptions.  I have given him his daily doses of Levaquin and he is received steroids today.  I [facility], continue using his inhaler, and return as needed.  This note was prepared with assistance of Conservation officer, historic buildingsDragon voice recognition software. Occasional wrong-word or sound-a-like substitutions may have occurred due to the inherent limitations of voice recognition software.   Final Clinical Impressions(s) / ED Diagnoses   Final diagnoses:  Mild intermittent asthma with exacerbation    ED Discharge Orders        Ordered    levofloxacin (LEVAQUIN) 750 MG tablet  Daily     04/13/17 1654    predniSONE (DELTASONE) 20 MG tablet  Daily     04/13/17 1654       Shaune PollackIsaacs, Conan Mcmanaway, MD 04/13/17 1655

## 2017-04-24 ENCOUNTER — Emergency Department (HOSPITAL_COMMUNITY)
Admission: EM | Admit: 2017-04-24 | Discharge: 2017-04-24 | Disposition: A | Discharge status: Home / Self Care | Payer: Self-pay | Attending: Emergency Medicine | Admitting: Emergency Medicine

## 2017-04-24 ENCOUNTER — Encounter (HOSPITAL_COMMUNITY): Payer: Self-pay | Admitting: Emergency Medicine

## 2017-04-24 DIAGNOSIS — F17200 Nicotine dependence, unspecified, uncomplicated: Secondary | ICD-10-CM

## 2017-04-24 DIAGNOSIS — J449 Chronic obstructive pulmonary disease, unspecified: Secondary | ICD-10-CM | POA: Insufficient documentation

## 2017-04-24 DIAGNOSIS — F17210 Nicotine dependence, cigarettes, uncomplicated: Secondary | ICD-10-CM | POA: Insufficient documentation

## 2017-04-24 DIAGNOSIS — J4521 Mild intermittent asthma with (acute) exacerbation: Secondary | ICD-10-CM | POA: Insufficient documentation

## 2017-04-24 DIAGNOSIS — Z79899 Other long term (current) drug therapy: Secondary | ICD-10-CM | POA: Insufficient documentation

## 2017-04-24 MED ORDER — ALBUTEROL SULFATE (2.5 MG/3ML) 0.083% IN NEBU: 2.5000 mg | INHALATION_SOLUTION | Freq: Four times a day (QID) | RESPIRATORY_TRACT | 1 refills | Status: DC | PRN

## 2017-04-24 MED ORDER — ALBUTEROL SULFATE HFA 108 (90 BASE) MCG/ACT IN AERS
2.0000 | INHALATION_SPRAY | Freq: Once | RESPIRATORY_TRACT | Status: AC
  Administered 2017-04-24: 2 via RESPIRATORY_TRACT
  Filled 2017-04-24: qty 6.7

## 2017-04-24 MED ORDER — AZITHROMYCIN 250 MG PO TABS: 250.0000 mg | ORAL_TABLET | Freq: Every day | ORAL | 0 refills | Status: DC

## 2017-04-24 MED ORDER — PREDNISONE 20 MG PO TABS: 40.0000 mg | ORAL_TABLET | Freq: Every day | ORAL | 0 refills | Status: DC

## 2017-04-24 MED ORDER — PREDNISONE 20 MG PO TABS
60.0000 mg | ORAL_TABLET | Freq: Once | ORAL | Status: AC
  Administered 2017-04-24: 60 mg via ORAL
  Filled 2017-04-24: qty 3

## 2017-04-24 MED ORDER — IPRATROPIUM BROMIDE 0.02 % IN SOLN
0.5000 mg | Freq: Once | RESPIRATORY_TRACT | Status: AC
  Administered 2017-04-24: 0.5 mg via RESPIRATORY_TRACT
  Filled 2017-04-24: qty 2.5

## 2017-04-24 MED ORDER — ALBUTEROL SULFATE (2.5 MG/3ML) 0.083% IN NEBU
5.0000 mg | INHALATION_SOLUTION | Freq: Once | RESPIRATORY_TRACT | Status: AC
  Administered 2017-04-24: 5 mg via RESPIRATORY_TRACT
  Filled 2017-04-24: qty 6

## 2017-04-24 NOTE — ED Notes (Signed)
Pt ambulating to restroom without difficulty.   

## 2017-04-24 NOTE — ED Notes (Signed)
Will PA at bedside.  

## 2017-04-24 NOTE — ED Notes (Signed)
Pt provided with food and bus pass.

## 2017-04-24 NOTE — ED Provider Notes (Signed)
[facility] EMERGENCY DEPARTMENT Provider Note   CSN: [id number] Arrival date & time: 04/24/17  0536     History   Chief Complaint Chief Complaint  Patient presents with  . Asthma    HPI Latroy Gaymon is a 37 y.o. male.  Webb Weed is a 37 y.o. Male with a history of smoking, COPD and asthma who presents to the emergency department complaining of a cough and wheezing for 2 weeks.  Patient reports he is continuing to smoke.  He was seen last week for similar complaint and was discharged with an albuterol inhaler and a course of prednisone and Levaquin.  He reports he did not take the Levaquin or the steroid.  He was having some body aches, but these have resolved.  He has had no fevers.  No hemoptysis.  He denies fevers, body aches, abdominal pain, nausea, vomiting, syncope, leg pain, leg swelling or rashes.   The history is provided by the patient and medical records. No language interpreter was used.  Asthma  Pertinent negatives include no chest pain, no abdominal pain, no headaches and no shortness of breath.    Past Medical History:  Diagnosis Date  . ADHD (attention deficit hyperactivity disorder)   . Asthma   . GERD (gastroesophageal reflux disease)   . Transaminitis 07/17/2015    Patient Active Problem List   Diagnosis Date Noted  . Cocaine abuse with cocaine-induced mood disorder (HCC) 03/22/2017  . Hypoxia   . COPD exacerbation (HCC)   . Leukocytosis 11/04/2015  . Transaminitis 07/17/2015  . Tooth ache 07/17/2015  . Asthma with status asthmaticus 10/14/2011  . ADHD (attention deficit hyperactivity disorder) 10/14/2011    Past Surgical History:  Procedure Laterality Date  . TYMPANOPLASTY Left 1997       Home Medications    Prior to Admission medications   Medication Sig Start Date End Date Taking? Authorizing Provider  albuterol (PROVENTIL HFA;VENTOLIN HFA) 108 (90 Base) MCG/ACT inhaler Inhale 1-2 puffs into the lungs every 6 (six)  hours as needed for wheezing or shortness of breath. 03/21/17  Yes Maczis, Elmer Sow, PA-C  Fluticasone Propionate, Inhal, (FLOVENT IN) Inhale 1 puff into the lungs daily as needed (wheezing).   Yes [provider]  gabapentin (NEURONTIN) 300 MG capsule Take 1 capsule (300 mg total) by mouth 3 (three) times daily. 03/22/17  Yes Charm Rings, NP  albuterol (PROVENTIL) (2.5 MG/3ML) 0.083% nebulizer solution Take 3 mLs (2.5 mg total) by nebulization every 6 (six) hours as needed for wheezing or shortness of breath. 04/24/17   Everlene Farrier, PA-C  azithromycin (ZITHROMAX) 250 MG tablet Take 1 tablet (250 mg total) by mouth daily. Take first 2 tablets together, then 1 every day until finished. 04/24/17   Everlene Farrier, PA-C  budesonide-formoterol (SYMBICORT) 80-4.5 MCG/ACT inhaler Inhale 2 puffs into the lungs daily. Patient not taking: Reported on 10/01/2016 09/04/16   Arthor Captain, PA-C  predniSONE (DELTASONE) 20 MG tablet Take 2 tablets (40 mg total) by mouth daily. 04/24/17   Everlene Farrier, PA-C    Family History Family History  Problem Relation Age of Onset  . Coronary artery disease Father   . Heart attack Father   . Heart disease Father   . Heart failure Mother   . Hyperlipidemia Mother   . Hypertension Mother   . Migraines Mother   . COPD Mother   . Diabetes Maternal Aunt   . Asthma Maternal Grandfather   . Asthma Paternal Grandfather  Social History Social History   Tobacco Use  . Smoking status: Current Every Day Smoker    Types: Cigarettes  . Smokeless tobacco: Never Used  Substance Use Topics  . Alcohol use: Yes    Alcohol/week: 0.0 oz  . Drug use: Yes    Types: Marijuana, Cocaine    Comment:                       Allergies   Augmentin [amoxicillin-pot clavulanate] and Other   Review of Systems Review of Systems  Constitutional: Negative for chills and fever.  HENT: Negative for congestion and sore throat.   Eyes: Negative for visual  disturbance.  Respiratory: Positive for chest tightness and wheezing. Negative for cough and shortness of breath.   Cardiovascular: Negative for chest pain, palpitations and leg swelling.  Gastrointestinal: Negative for abdominal pain, nausea and vomiting.  Genitourinary: Negative for dysuria.  Musculoskeletal: Negative for back pain and myalgias.  Skin: Negative for rash.  Neurological: Negative for headaches.     Physical Exam Updated Vital Signs BP 107/71   Pulse 82   Temp 98.2 F (36.8 C)   Resp 18   Ht 5\' 10"  (1.778 m)   Wt 85.7 kg (189 lb)   SpO2 96%   BMI 27.12 kg/m   Physical Exam  Constitutional: He appears well-developed and well-nourished. No distress.  Nontoxic-appearing.  HENT:  Head: Normocephalic and atraumatic.  Mouth/Throat: Oropharynx is clear and moist.  Eyes: Conjunctivae are normal. Pupils are equal, round, and reactive to light. Right eye exhibits no discharge. Left eye exhibits no discharge.  Neck: Neck supple.  Cardiovascular: Normal rate, regular rhythm, normal heart sounds and intact distal pulses. Exam reveals no gallop and no friction rub.  No murmur heard. Pulmonary/Chest: Effort normal. No stridor. No respiratory distress. He has wheezes. He has no rales.  Scattered expiratory wheezes noted bilaterally.  No increased work of breathing.  No rales or rhonchi.  Symmetric chest expansion bilaterally.  Abdominal: Soft. There is no tenderness.  Musculoskeletal: He exhibits no edema or tenderness.  Lymphadenopathy:    He has no cervical adenopathy.  Neurological: He is alert. Coordination normal.  Skin: Skin is warm and dry. No rash noted. He is not diaphoretic. No erythema. No pallor.  Psychiatric: He has a normal mood and affect. His behavior is normal.  Nursing note and vitals reviewed.    ED Treatments / Results  Labs (all labs ordered are listed, but only abnormal results are displayed) Labs Reviewed - No data to display  EKG  EKG  Interpretation None       Radiology No results found.  Procedures Procedures (including critical care time)  Medications Ordered in ED Medications  albuterol (PROVENTIL) (2.5 MG/3ML) 0.083% nebulizer solution 5 mg (5 mg Nebulization Given 04/24/17 0639)  ipratropium (ATROVENT) nebulizer solution 0.5 mg (0.5 mg Nebulization Given 04/24/17 0639)  predniSONE (DELTASONE) tablet 60 mg (60 mg Oral Given 04/24/17 0641)  albuterol (PROVENTIL HFA;VENTOLIN HFA) 108 (90 Base) MCG/ACT inhaler 2 puff (2 puffs Inhalation Given 04/24/17 1011)     Initial Impression / Assessment and Plan / ED Course  I have reviewed the triage vital signs and the nursing notes.  Pertinent labs & imaging results that were available during my care of the patient were reviewed by me and considered in my medical decision making (see chart for details).  The patient was counseled on the dangers of tobacco use, and was advised to quit.  Reviewed strategies to maximize success, including written materials.     This is a 37 y.o. Male with a history of smoking, COPD and asthma who presents to the emergency department complaining of a cough and wheezing for 2 weeks.  Patient reports he is continuing to smoke.  He was seen last week for similar complaint and was discharged with an albuterol inhaler and a course of prednisone and Levaquin.  He reports he did not take the Levaquin or the steroid.  He was having some body aches, but these have resolved.  He has had no fevers. On exam the patient is afebrile nontoxic-appearing.  He has scattered expiratory wheezes noted bilaterally.  No increased work of breathing.  Oxygen saturation 100% on room air.  No rales or rhonchi.  Will provide with prednisone and breathing treatment and reevaluate. Chest x-ray from several days ago is unremarkable.  At reevaluation patient reports feeling much better.  He is not tachypneic, tachycardic or hypoxic.  Lung sounds have improved.  Only faint  expiratory wheeze noted to left upper field.  He feels ready to go home.  He requests refill for his albuterol nebulization machine.  Will refill his albuterol and provide him with an albuterol inhaler to go home with.  We will also do a course of prednisone and azithromycin due to his smoking and possible COPD.  [facility] and for primary care follow-up.  I discussed strict and specific return precautions. I advised the patient to follow-up with their primary care provider this week. I advised the patient to return to the emergency department with new or worsening symptoms or new concerns. The patient verbalized understanding and agreement with plan.      Final Clinical Impressions(s) / ED Diagnoses   Final diagnoses:  Mild intermittent asthma with exacerbation  Smoking    ED Discharge Orders        Ordered    albuterol (PROVENTIL) (2.5 MG/3ML) 0.083% nebulizer solution  Every 6 hours PRN     04/24/17 1007    predniSONE (DELTASONE) 20 MG tablet  Daily     04/24/17 1007    azithromycin (ZITHROMAX) 250 MG tablet  Daily     04/24/17 1007       Everlene FarrierDansie, Marcha Licklider, PA-C 04/24/17 1016    Ward, Kristen N, DO 04/24/17 1500

## 2017-04-24 NOTE — ED Triage Notes (Signed)
Patient reports wheezing with productive cough /SOB and chest congestion onset this week , pt. ran out of his MDI /nebulizer medication . Denies fever or chills . O2 sat=98% room air.

## 2017-05-08 ENCOUNTER — Emergency Department (HOSPITAL_COMMUNITY)
Admission: EM | Admit: 2017-05-08 | Discharge: 2017-05-08 | Disposition: A | Discharge status: Home / Self Care | Payer: Self-pay | Attending: Emergency Medicine | Admitting: Emergency Medicine

## 2017-05-08 ENCOUNTER — Other Ambulatory Visit: Payer: Self-pay

## 2017-05-08 ENCOUNTER — Emergency Department (HOSPITAL_COMMUNITY): Payer: Self-pay

## 2017-05-08 ENCOUNTER — Encounter (HOSPITAL_COMMUNITY): Payer: Self-pay | Admitting: Emergency Medicine

## 2017-05-08 DIAGNOSIS — F17210 Nicotine dependence, cigarettes, uncomplicated: Secondary | ICD-10-CM | POA: Insufficient documentation

## 2017-05-08 DIAGNOSIS — Z79899 Other long term (current) drug therapy: Secondary | ICD-10-CM | POA: Insufficient documentation

## 2017-05-08 DIAGNOSIS — J4521 Mild intermittent asthma with (acute) exacerbation: Secondary | ICD-10-CM | POA: Insufficient documentation

## 2017-05-08 DIAGNOSIS — J449 Chronic obstructive pulmonary disease, unspecified: Secondary | ICD-10-CM | POA: Insufficient documentation

## 2017-05-08 MED ORDER — ALBUTEROL SULFATE HFA 108 (90 BASE) MCG/ACT IN AERS
2.0000 | INHALATION_SPRAY | RESPIRATORY_TRACT | Status: DC | PRN
  Administered 2017-05-08: 2 via RESPIRATORY_TRACT
  Filled 2017-05-08: qty 6.7

## 2017-05-08 MED ORDER — PREDNISONE 50 MG PO TABS: ORAL_TABLET | ORAL | 0 refills | Status: DC

## 2017-05-08 MED ORDER — ALBUTEROL SULFATE (2.5 MG/3ML) 0.083% IN NEBU: 5.0000 mg | INHALATION_SOLUTION | Freq: Once | RESPIRATORY_TRACT | Status: DC

## 2017-05-08 NOTE — Discharge Instructions (Signed)
Use the inhaler 2 puffs every 4 hours as needed.  °

## 2017-05-08 NOTE — ED Triage Notes (Signed)
Pt arrives from home via [facility] reporting asthma flare. EMS reports wheezing throughout initially, reports giving 10 mg albuterol, 0.5 mg atrovent, 125 mg solu-medrol. On arrival pt wheezing in bilat upper lobes, 26RR, satting 99% RA.

## 2017-05-08 NOTE — ED Provider Notes (Signed)
[facility] Provider Note   CSN: [id number] Arrival date & time: 05/08/17  1030     History   Chief Complaint Chief Complaint  Patient presents with  . Shortness of Breath  . Asthma    HPI Devin [name] is a 37 y.o. male who presents to the ED Via EMS with c/o asthma flare. 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 Patient is out of his inhaler. This morning the symptoms were worse and patient was short of breath so he called EMS. Patient was given neb treatment of albuterol 10 mg iwht 0.5 me Atrovent.by EMS in route and solumedrol 125 mg. On arrival to the ED patient with bilateral wheezing upper lobes, per RN exam, O2 SAT 99% on R/A.   HPI  Past Medical History:  Diagnosis Date  . ADHD (attention deficit hyperactivity disorder)   . Asthma   . GERD (gastroesophageal reflux disease)   . Transaminitis 07/17/2015    Patient Active Problem List   Diagnosis Date Noted  . Cocaine abuse with cocaine-induced mood disorder (HCC) 03/22/2017  . Hypoxia   . COPD exacerbation (HCC)   . Leukocytosis 11/04/2015  . Transaminitis 07/17/2015  . Tooth ache 07/17/2015  . Asthma with status asthmaticus 10/14/2011  . ADHD (attention deficit hyperactivity disorder) 10/14/2011    Past Surgical History:  Procedure Laterality Date  . TYMPANOPLASTY Left 1997       Home Medications    Prior to Admission medications   Medication Sig Start Date End Date Taking? Authorizing Provider  albuterol (PROVENTIL HFA;VENTOLIN HFA) 108 (90 Base) MCG/ACT inhaler Inhale 1-2 puffs into the lungs every 6 (six) hours as needed for wheezing or shortness of breath. 03/21/17   Maczis, Elmer SowMichael M, PA-C  albuterol (PROVENTIL) (2.5 MG/3ML) 0.083% nebulizer solution Take 3 mLs (2.5 mg total) by nebulization every 6 (six) hours as needed for wheezing or shortness of breath. 04/24/17   Everlene Farrieransie, William, PA-C  azithromycin (ZITHROMAX) 250 MG tablet  Take 1 tablet (250 mg total) by mouth daily. Take first 2 tablets together, then 1 every day until finished. 04/24/17   Everlene Farrieransie, William, PA-C  budesonide-formoterol (SYMBICORT) 80-4.5 MCG/ACT inhaler Inhale 2 puffs into the lungs daily. Patient not taking: Reported on 10/01/2016 09/04/16   Arthor CaptainHarris, Abigail, PA-C  Fluticasone Propionate, Inhal, (FLOVENT IN) Inhale 1 puff into the lungs daily as needed (wheezing).    [provider]  gabapentin (NEURONTIN) 300 MG capsule Take 1 capsule (300 mg total) by mouth 3 (three) times daily. 03/22/17   Charm RingsLord, Jamison Y, NP  predniSONE (DELTASONE) 50 MG tablet Take one tablet PO daily 05/08/17   Janne NapoleonNeese, Hope M, NP    Family History Family History  Problem Relation Age of Onset  . Coronary artery disease Father   . Heart attack Father   . Heart disease Father   . Heart failure Mother   . Hyperlipidemia Mother   . Hypertension Mother   . Migraines Mother   . COPD Mother   . Diabetes Maternal Aunt   . Asthma Maternal Grandfather   . Asthma Paternal Grandfather     Social History Social History   Tobacco Use  . Smoking status: Current Every Day Smoker    Packs/day: 0.50    Types: Cigarettes  . Smokeless tobacco: Never Used  Substance Use Topics  . Alcohol use: Yes    Alcohol/week: 0.0 oz  . Drug use: Yes    Types: Marijuana,  Cocaine    Comment:                       Allergies   Augmentin [amoxicillin-pot clavulanate] and Other   Review of Systems Review of Systems  Constitutional: Negative for chills and fever.  HENT: Negative.   Respiratory: Positive for cough, shortness of breath and wheezing.   Cardiovascular: Negative for chest pain and palpitations.  Gastrointestinal: Negative for abdominal pain, nausea and vomiting.  Genitourinary: Negative.   Musculoskeletal: Negative for arthralgias and back pain.  Skin: Negative for rash.  Neurological: Negative for syncope and light-headedness.  Psychiatric/Behavioral: Negative for  confusion.     Physical Exam Updated Vital Signs BP 111/83 (BP Location: Right Arm)   Pulse 78   Temp 98.4 F (36.9 C) (Oral)   Resp (!) 24   SpO2 99%   Physical Exam  Constitutional: He is oriented to person, place, and time. He appears well-developed and well-nourished. He does not appear ill. No distress.  HENT:  Head: Normocephalic and atraumatic.  Mouth/Throat: Oropharynx is clear and moist.  Eyes: EOM are normal. Pupils are equal, round, and reactive to light.  Neck: Normal range of motion. Neck supple.  Cardiovascular: Normal rate and regular rhythm.  Pulmonary/Chest: Effort normal.  Patient examined after neb treatment and lungs are clear.  Abdominal: Soft. There is no tenderness.  Musculoskeletal: Normal range of motion.  Neurological: He is alert and oriented to person, place, and time. No cranial nerve deficit.  Skin: Skin is warm and dry.  Psychiatric: He has a normal mood and affect. His behavior is normal.  Nursing note and vitals reviewed.    ED Treatments / Results  Labs (all labs ordered are listed, but only abnormal results are displayed) Labs Reviewed - No data to display  Radiology Dg Chest 2 View  Result Date: 05/08/2017 CLINICAL DATA:  Shortness of breath. EXAM: CHEST  2 VIEW COMPARISON:  04/13/2017. FINDINGS: Mediastinum and hilar structures normal. Lungs are clear of acute infiltrates. Mild basal pleural-parenchymal thickening consistent with scarring. No pleural effusion or pneumothorax. Heart size normal. No acute bony abnormality. IMPRESSION: No acute cardiopulmonary disease. Electronically Signed   By: Maisie Fus  Register   On: 05/08/2017 13:04    Procedures Procedures (including critical care time)  Medications Ordered in ED Medications  albuterol (PROVENTIL) (2.5 MG/3ML) 0.083% nebulizer solution 5 mg (not administered)  albuterol (PROVENTIL HFA;VENTOLIN HFA) 108 (90 Base) MCG/ACT inhaler 2 puff (not administered)     Initial Impression  / Assessment and Plan / ED Course  I have reviewed the triage vital signs and the nursing notes. 37 y.o. male with asthma attack stable for d/c without wheezing or shortness of breath after neb treatment. Chest x-ray is normal. Discussed with patient x-ray results and plan of care. Patient agrees with plan.   Final Clinical Impressions(s) / ED Diagnoses   Final diagnoses:  Mild intermittent asthma with exacerbation    ED Discharge Orders        Ordered    predniSONE (DELTASONE) 50 MG tablet     05/08/17 1327       Damian Leavell Douglass, NP 05/08/17 1330    Vanetta Mulders, MD 05/08/17 1826

## 2017-05-08 NOTE — ED Notes (Signed)
Patient transported to X-ray 

## 2017-05-08 NOTE — ED Notes (Signed)
NP at bedside.

## 2017-05-16 ENCOUNTER — Emergency Department (HOSPITAL_COMMUNITY): Payer: Self-pay

## 2017-05-16 ENCOUNTER — Encounter (HOSPITAL_COMMUNITY): Payer: Self-pay | Admitting: Emergency Medicine

## 2017-05-16 ENCOUNTER — Emergency Department (HOSPITAL_COMMUNITY)
Admission: EM | Admit: 2017-05-16 | Discharge: 2017-05-16 | Disposition: A | Discharge status: Home / Self Care | Payer: Self-pay | Attending: Emergency Medicine | Admitting: Emergency Medicine

## 2017-05-16 DIAGNOSIS — J449 Chronic obstructive pulmonary disease, unspecified: Secondary | ICD-10-CM | POA: Insufficient documentation

## 2017-05-16 DIAGNOSIS — F909 Attention-deficit hyperactivity disorder, unspecified type: Secondary | ICD-10-CM | POA: Insufficient documentation

## 2017-05-16 DIAGNOSIS — F17210 Nicotine dependence, cigarettes, uncomplicated: Secondary | ICD-10-CM | POA: Insufficient documentation

## 2017-05-16 DIAGNOSIS — J4531 Mild persistent asthma with (acute) exacerbation: Secondary | ICD-10-CM | POA: Insufficient documentation

## 2017-05-16 DIAGNOSIS — J45909 Unspecified asthma, uncomplicated: Secondary | ICD-10-CM | POA: Insufficient documentation

## 2017-05-16 DIAGNOSIS — Z79899 Other long term (current) drug therapy: Secondary | ICD-10-CM | POA: Insufficient documentation

## 2017-05-16 MED ORDER — PREDNISONE 50 MG PO TABS: 50.0000 mg | ORAL_TABLET | Freq: Every day | ORAL | 0 refills | Status: AC

## 2017-05-16 MED ORDER — ALBUTEROL SULFATE HFA 108 (90 BASE) MCG/ACT IN AERS
2.0000 | INHALATION_SPRAY | Freq: Once | RESPIRATORY_TRACT | Status: AC
  Administered 2017-05-16: 2 via RESPIRATORY_TRACT
  Filled 2017-05-16: qty 6.7

## 2017-05-16 MED ORDER — IPRATROPIUM BROMIDE 0.02 % IN SOLN
0.5000 mg | Freq: Once | RESPIRATORY_TRACT | Status: AC
  Administered 2017-05-16: 0.5 mg via RESPIRATORY_TRACT
  Filled 2017-05-16: qty 2.5

## 2017-05-16 MED ORDER — ALBUTEROL SULFATE (2.5 MG/3ML) 0.083% IN NEBU
5.0000 mg | INHALATION_SOLUTION | Freq: Once | RESPIRATORY_TRACT | Status: AC
  Administered 2017-05-16: 5 mg via RESPIRATORY_TRACT
  Filled 2017-05-16: qty 6

## 2017-05-16 NOTE — ED Triage Notes (Signed)
Per [facility]: Pt to ED c/o asthma flare up today. Given 5mg  albuterol and 125mg  solumedrol, lung sounds clear bilaterally. EMS VS: HR 85 NSR, 139/81, RR 18, 97% RA. Resp e/u, skin warm/dry. Was here last week for same.

## 2017-05-16 NOTE — ED Provider Notes (Signed)
[facility] EMERGENCY [facility]PART[facility] Provider Note   CSN: [id number] Arrival date & time: 05/16/17  1421     History   Chief Complaint Chief Complaint  Patient presents with  . Asthma    HPI  Devin [name] is a 37 y.o. Male with history of asthma, GERD and ADHD, presents to the ED via EMS for evaluation of an asthma flareup.  Patient reports for the last 3-4 days he had increased wheezing and difficulty breathing which worsened this morning.  Patient reports he has been without his albuterol inhaler because he could not afford his medications.  Recently gained employment will be able to afford his medications in the next week or 2.  Patient denies any fevers or chills no cough, nasal congestion or sore throat.  Patient reports changing weather is a common trigger for his asthma exacerbations and he thinks that is what caused this.  Patient denies any chest pain or abdominal pain.      Past Medical History:  Diagnosis Date  . ADHD (attention deficit hyperactivity disorder)   . Asthma   . GERD (gastroesophageal reflux disease)   . Transaminitis 07/17/2015    Patient Active Problem List   Diagnosis Date Noted  . Cocaine abuse with cocaine-induced mood disorder (HCC) 03/22/2017  . Hypoxia   . COPD exacerbation (HCC)   . Leukocytosis 11/04/2015  . Transaminitis 07/17/2015  . Tooth ache 07/17/2015  . Asthma with status asthmaticus 10/14/2011  . ADHD (attention deficit hyperactivity disorder) 10/14/2011    Past Surgical History:  Procedure Laterality Date  . TYMPANOPLASTY Left 1997       Home Medications    Prior to Admission medications   Medication Sig Start Date End Date Taking? Authorizing Provider  albuterol (PROVENTIL HFA;VENTOLIN HFA) 108 (90 Base) MCG/ACT inhaler Inhale 1-2 puffs into the lungs every 6 (six) hours as needed for wheezing or shortness of breath. 03/21/17   Maczis, Elmer SowMichael M, PA-C  albuterol (PROVENTIL) (2.5 MG/3ML) 0.083% nebulizer  solution Take 3 mLs (2.5 mg total) by nebulization every 6 (six) hours as needed for wheezing or shortness of breath. 04/24/17   Everlene Farrieransie, William, PA-C  azithromycin (ZITHROMAX) 250 MG tablet Take 1 tablet (250 mg total) by mouth daily. Take first 2 tablets together, then 1 every day until finished. 04/24/17   Everlene Farrieransie, William, PA-C  budesonide-formoterol (SYMBICORT) 80-4.5 MCG/ACT inhaler Inhale 2 puffs into the lungs daily. Patient not taking: Reported on 10/01/2016 09/04/16   Arthor CaptainHarris, Abigail, PA-C  Fluticasone Propionate, Inhal, (FLOVENT IN) Inhale 1 puff into the lungs daily as needed (wheezing).    [provider]  gabapentin (NEURONTIN) 300 MG capsule Take 1 capsule (300 mg total) by mouth 3 (three) times daily. 03/22/17   Charm RingsLord, Jamison Y, NP  predniSONE (DELTASONE) 50 MG tablet Take one tablet PO daily 05/08/17   Janne NapoleonNeese, Hope M, NP  predniSONE (DELTASONE) 50 MG tablet Take 1 tablet (50 mg total) by mouth daily for 5 days. 05/16/17 05/21/17  Dartha LodgeFord, Cherelle Midkiff N, PA-C  budesonide (PULMICORT FLEXHALER) 180 MCG/ACT inhaler Inhale 2 puffs into the lungs daily. 03/07/14 03/14/14  MabeLatanya Maudlin, Martha L, MD    Family History Family History  Problem Relation Age of Onset  . Coronary artery disease Father   . Heart attack Father   . Heart disease Father   . Heart failure Mother   . Hyperlipidemia Mother   . Hypertension Mother   . Migraines Mother   . COPD Mother   . Diabetes Maternal  Aunt   . Asthma Maternal Grandfather   . Asthma Paternal Grandfather     Social History Social History   Tobacco Use  . Smoking status: Current Every Day Smoker    Packs/day: 0.50    Types: Cigarettes  . Smokeless tobacco: Never Used  Substance Use Topics  . Alcohol use: Yes    Alcohol/week: 0.0 oz  . Drug use: Yes    Types: Marijuana, Cocaine    Comment:                       Allergies   Augmentin [amoxicillin-pot clavulanate] and Other   Review of Systems Review of Systems   Physical Exam Updated  Vital Signs BP 114/74   Pulse 80   Temp 98 F (36.7 C) (Oral)   Resp 18   SpO2 99%   Physical Exam  Constitutional: He is oriented to person, place, and time. He appears well-developed and well-nourished. No distress.  HENT:  Head: Normocephalic and atraumatic.  Mouth/Throat: Oropharynx is clear and moist.  Eyes: Right eye exhibits no discharge. Left eye exhibits no discharge.  Neck: Neck supple.  Cardiovascular: Normal rate, regular rhythm and normal heart sounds.  Pulmonary/Chest: Effort normal. No respiratory distress.  On my exam lungs are clear to auscultation bilaterally, the patient does have some decreased air movement and lungs sound tight, respirations are equal and unlabored, patient able to speak in full sentences.  Abdominal: Soft. Bowel sounds are normal. He exhibits no distension. There is no tenderness.  Neurological: He is alert and oriented to person, place, and time. Coordination normal.  Skin: Skin is warm and dry. He is not diaphoretic.  Psychiatric: He has a normal mood and affect. His behavior is normal.  Nursing note and vitals reviewed.    ED Treatments / Results  Labs (all labs ordered are listed, but only abnormal results are displayed) Labs Reviewed - No data to display  EKG  EKG Interpretation None       Radiology Dg Chest 2 View  Result Date: 05/16/2017 CLINICAL DATA:  Asthma flare up EXAM: CHEST  2 VIEW COMPARISON:  05/08/2016 FINDINGS: The heart size and mediastinal contours are within normal limits. Both lungs are clear. The visualized skeletal structures are unremarkable. IMPRESSION: No active cardiopulmonary disease. Electronically Signed   By: Tollie Eth M.D.   On: 05/16/2017 17:07    Procedures Procedures (including critical care time)  Medications Ordered in ED Medications  albuterol (PROVENTIL) (2.5 MG/3ML) 0.083% nebulizer solution 5 mg (5 mg Nebulization Given 05/16/17 1706)  ipratropium (ATROVENT) nebulizer solution 0.5 mg  (0.5 mg Nebulization Given 05/16/17 1706)  albuterol (PROVENTIL HFA;VENTOLIN HFA) 108 (90 Base) MCG/ACT inhaler 2 puff (2 puffs Inhalation Given 05/16/17 1706)     Initial Impression / Assessment and Plan / ED Course  I have reviewed the triage vital signs and the nursing notes.  Pertinent labs & imaging results that were available during my care of the patient were reviewed by me and considered in my medical decision making (see chart for details).  Presents via EMS for asthma exacerbation, worsening few days, out of inhaler at home. Patient received 5 mg of albuterol and 125 Solu-Medrol with EMS prior to arrival.  On initial evaluation vital signs normal, complaining of some tightness, but able to speak in full sentences with no signs of respiratory distress.  Lungs clear to auscultation but with some decreased air movement will give additional albuterol nebulizer as well as  Atrovent and get chest x-ray to rule out any underlying pneumonia.  Chest x-ray negative for acute infiltrate.  After albuterol and Atrovent nebulizers patient is moving air much better and reports improvement.  At this time patient is stable for discharge home, with 5-day course of steroids, patient provided albuterol inhaler here in the ED.  Strict return precautions discussed.  [facility] to [facility] with [facility].  Patient expresses understanding and is in agreement with plan.  Final Clinical Impressions(s) / ED Diagnoses   Final diagnoses:  Mild persistent asthma with exacerbation    ED Discharge Orders        Ordered    predniSONE (DELTASONE) 50 MG tablet  Daily     05/16/17 1701       Dartha Lodge, [place] 05/16/17 1731    [name], MD 05/17/17 0000

## 2017-05-16 NOTE — Discharge Instructions (Signed)
Please complete course of steroids and continue to use your albuterol inhaler every 4 hours for the next 24 hours and then every 4 hours as needed.  Please call to schedule follow-up appointment with the [facility] for continued management of your asthma.  If you have worsening shortness of breath, wheezing or other new or concerning symptoms develop please return to the emergency department.

## 2017-06-28 ENCOUNTER — Emergency Department (HOSPITAL_COMMUNITY)
Admission: EM | Admit: 2017-06-28 | Discharge: 2017-06-29 | Disposition: A | Discharge status: Home / Self Care | Payer: Self-pay | Attending: Emergency Medicine | Admitting: Emergency Medicine

## 2017-06-28 ENCOUNTER — Emergency Department (HOSPITAL_COMMUNITY)
Admission: EM | Admit: 2017-06-28 | Discharge: 2017-06-28 | Disposition: A | Discharge status: Home / Self Care | Payer: Self-pay | Attending: Emergency Medicine | Admitting: Emergency Medicine

## 2017-06-28 ENCOUNTER — Encounter (HOSPITAL_COMMUNITY): Payer: Self-pay | Admitting: *Deleted

## 2017-06-28 ENCOUNTER — Emergency Department (HOSPITAL_COMMUNITY): Payer: Self-pay

## 2017-06-28 ENCOUNTER — Other Ambulatory Visit: Payer: Self-pay

## 2017-06-28 ENCOUNTER — Encounter (HOSPITAL_COMMUNITY): Payer: Self-pay

## 2017-06-28 DIAGNOSIS — J4521 Mild intermittent asthma with (acute) exacerbation: Secondary | ICD-10-CM | POA: Insufficient documentation

## 2017-06-28 DIAGNOSIS — Z5900 Homelessness unspecified: Secondary | ICD-10-CM

## 2017-06-28 DIAGNOSIS — F1910 Other psychoactive substance abuse, uncomplicated: Secondary | ICD-10-CM

## 2017-06-28 DIAGNOSIS — R0602 Shortness of breath: Secondary | ICD-10-CM | POA: Insufficient documentation

## 2017-06-28 DIAGNOSIS — F1414 Cocaine abuse with cocaine-induced mood disorder: Secondary | ICD-10-CM | POA: Insufficient documentation

## 2017-06-28 DIAGNOSIS — F17210 Nicotine dependence, cigarettes, uncomplicated: Secondary | ICD-10-CM | POA: Insufficient documentation

## 2017-06-28 DIAGNOSIS — J449 Chronic obstructive pulmonary disease, unspecified: Secondary | ICD-10-CM | POA: Insufficient documentation

## 2017-06-28 DIAGNOSIS — R45851 Suicidal ideations: Secondary | ICD-10-CM | POA: Insufficient documentation

## 2017-06-28 LAB — HEPATIC FUNCTION PANEL
ALBUMIN: 4.1 g/dL (ref 3.5–5.0)
ALK PHOS: 55 U/L (ref 38–126)
ALT: 24 U/L (ref 17–63)
AST: 32 U/L (ref 15–41)
Bilirubin, Direct: 0.2 mg/dL (ref 0.1–0.5)
Indirect Bilirubin: 1 mg/dL — ABNORMAL HIGH (ref 0.3–0.9)
TOTAL PROTEIN: 6.5 g/dL (ref 6.5–8.1)
Total Bilirubin: 1.2 mg/dL (ref 0.3–1.2)

## 2017-06-28 LAB — COMPREHENSIVE METABOLIC PANEL
ALBUMIN: 4.3 g/dL (ref 3.5–5.0)
ALK PHOS: 59 U/L (ref 38–126)
ALT: 25 U/L (ref 17–63)
AST: 30 U/L (ref 15–41)
Anion gap: 12 (ref 5–15)
BILIRUBIN TOTAL: 1.6 mg/dL — AB (ref 0.3–1.2)
BUN: 14 mg/dL (ref 6–20)
CO2: 24 mmol/L (ref 22–32)
Calcium: 9.4 mg/dL (ref 8.9–10.3)
Chloride: 101 mmol/L (ref 101–111)
Creatinine, Ser: 1.21 mg/dL (ref 0.61–1.24)
GFR calc Af Amer: >60 mL/min (ref >60)
GFR calc non Af Amer: >60 mL/min (ref >60)
GLUCOSE: 97 mg/dL (ref 65–99)
POTASSIUM: 3.9 mmol/L (ref 3.5–5.1)
Sodium: 137 mmol/L (ref 135–145)
TOTAL PROTEIN: 7.5 g/dL (ref 6.5–8.1)

## 2017-06-28 LAB — ACETAMINOPHEN LEVEL

## 2017-06-28 LAB — CBC
HCT: 44.5 % (ref 39.0–52.0)
HCT: 43.3 % (ref 39.0–52.0)
HEMOGLOBIN: 14.7 g/dL (ref 13.0–17.0)
Hemoglobin: 15.8 g/dL (ref 13.0–17.0)
MCH: 31 pg (ref 26.0–34.0)
MCH: 30.4 pg (ref 26.0–34.0)
MCHC: 35.5 g/dL (ref 30.0–36.0)
MCHC: 33.9 g/dL (ref 30.0–36.0)
MCV: 87.3 fL (ref 78.0–100.0)
MCV: 89.5 fL (ref 78.0–100.0)
PLATELETS: 413 10*3/uL — AB (ref 150–400)
Platelets: 386 10*3/uL (ref 150–400)
RBC: 5.1 MIL/uL (ref 4.22–5.81)
RBC: 4.84 MIL/uL (ref 4.22–5.81)
RDW: 13 % (ref 11.5–15.5)
RDW: 13.3 % (ref 11.5–15.5)
WBC: 12.4 10*3/uL — AB (ref 4.0–10.5)
WBC: 10.8 10*3/uL — ABNORMAL HIGH (ref 4.0–10.5)

## 2017-06-28 LAB — RAPID URINE DRUG SCREEN, HOSP PERFORMED
Amphetamines: NOT DETECTED
BARBITURATES: NOT DETECTED
Benzodiazepines: NOT DETECTED
Cocaine: POSITIVE — AB
OPIATES: NOT DETECTED
TETRAHYDROCANNABINOL: POSITIVE — AB

## 2017-06-28 LAB — I-STAT TROPONIN, ED: TROPONIN I, POC: 0 ng/mL (ref 0.00–0.08)

## 2017-06-28 LAB — BASIC METABOLIC PANEL
Anion gap: 15 (ref 5–15)
BUN: 10 mg/dL (ref 6–20)
CALCIUM: 9.6 mg/dL (ref 8.9–10.3)
CO2: 20 mmol/L — ABNORMAL LOW (ref 22–32)
CREATININE: 1.11 mg/dL (ref 0.61–1.24)
Chloride: 99 mmol/L — ABNORMAL LOW (ref 101–111)
Glucose, Bld: 100 mg/dL — ABNORMAL HIGH (ref 65–99)
Potassium: 3.8 mmol/L (ref 3.5–5.1)
SODIUM: 134 mmol/L — AB (ref 135–145)

## 2017-06-28 LAB — SALICYLATE LEVEL: Salicylate Lvl: <7 mg/dL (ref 2.8–30.0)

## 2017-06-28 LAB — ETHANOL: Alcohol, Ethyl (B): <10 mg/dL (ref <10)

## 2017-06-28 MED ORDER — LORAZEPAM 1 MG PO TABS: 0.0000 mg | ORAL_TABLET | Freq: Four times a day (QID) | ORAL | Status: DC

## 2017-06-28 MED ORDER — NICOTINE 21 MG/24HR TD PT24: 21.0000 mg | MEDICATED_PATCH | Freq: Every day | TRANSDERMAL | Status: DC

## 2017-06-28 MED ORDER — ALBUTEROL SULFATE HFA 108 (90 BASE) MCG/ACT IN AERS
1.0000 | INHALATION_SPRAY | Freq: Once | RESPIRATORY_TRACT | Status: AC
  Administered 2017-06-28: 2 via RESPIRATORY_TRACT
  Filled 2017-06-28: qty 6.7

## 2017-06-28 MED ORDER — LORAZEPAM 2 MG/ML IJ SOLN: 0.0000 mg | Freq: Two times a day (BID) | INTRAMUSCULAR | Status: DC

## 2017-06-28 MED ORDER — LORAZEPAM 2 MG/ML IJ SOLN: 0.0000 mg | Freq: Four times a day (QID) | INTRAMUSCULAR | Status: DC

## 2017-06-28 MED ORDER — ONDANSETRON HCL 4 MG PO TABS: 4.0000 mg | ORAL_TABLET | Freq: Three times a day (TID) | ORAL | Status: DC | PRN

## 2017-06-28 MED ORDER — IBUPROFEN 200 MG PO TABS: 600.0000 mg | ORAL_TABLET | Freq: Three times a day (TID) | ORAL | Status: DC | PRN

## 2017-06-28 MED ORDER — VITAMIN B-1 100 MG PO TABS: 100.0000 mg | ORAL_TABLET | Freq: Every day | ORAL | Status: DC

## 2017-06-28 MED ORDER — ALUM & MAG HYDROXIDE-SIMETH 200-200-20 MG/5ML PO SUSP: 30.0000 mL | Freq: Four times a day (QID) | ORAL | Status: DC | PRN

## 2017-06-28 MED ORDER — PREDNISONE 10 MG PO TABS: 40.0000 mg | ORAL_TABLET | Freq: Every day | ORAL | 0 refills | Status: AC

## 2017-06-28 MED ORDER — LORAZEPAM 1 MG PO TABS: 0.0000 mg | ORAL_TABLET | Freq: Two times a day (BID) | ORAL | Status: DC

## 2017-06-28 MED ORDER — ALBUTEROL SULFATE HFA 108 (90 BASE) MCG/ACT IN AERS
2.0000 | INHALATION_SPRAY | RESPIRATORY_TRACT | Status: DC | PRN
  Administered 2017-06-28: 2 via RESPIRATORY_TRACT
  Filled 2017-06-28: qty 6.7

## 2017-06-28 MED ORDER — IPRATROPIUM-ALBUTEROL 0.5-2.5 (3) MG/3ML IN SOLN
3.0000 mL | Freq: Once | RESPIRATORY_TRACT | Status: AC
  Administered 2017-06-28: 3 mL via RESPIRATORY_TRACT
  Filled 2017-06-28: qty 3

## 2017-06-28 MED ORDER — THIAMINE HCL 100 MG/ML IJ SOLN: 100.0000 mg | Freq: Every day | INTRAMUSCULAR | Status: DC

## 2017-06-28 MED ORDER — ONDANSETRON HCL 4 MG/2ML IJ SOLN
4.0000 mg | Freq: Once | INTRAMUSCULAR | Status: AC
  Administered 2017-06-28: 4 mg via INTRAVENOUS
  Filled 2017-06-28: qty 2

## 2017-06-28 NOTE — Progress Notes (Addendum)
Update: CSW faxed info to facility. RN please follow up with them on if they can accept patient [phone]).  CSW received referral regarding substance use resources. CSW spoke with patient and provided list of substance use resources. He stated [facility] and [facility] helped him get set up there because he does not have a phone. CSW contacted [facility] to see if they would accept patient but they require more notes than is charted to assess patient. If more medical notes are placed in chart, RN may fax them to [phone] for assessment and speak to the on call nurse ([phone]). CSW also contacted [facility], but they are unable to give patient a bed (patient must contact them on Monday).  Unfortunately we are unable to hold patients for substance use treatment.   CSW signing off.  Osborne Cascoadia Guiselle Mian LCSW [phone]

## 2017-06-28 NOTE — ED Provider Notes (Signed)
[facility]-EMERGENCY DEPT Provider Note   CSN: [id number] Arrival date & time: 06/28/17  [id number]     History   Chief Complaint Chief Complaint  Patient presents with  . Suicidal    HPI Devin [name] is a 37 y.o. male.  HPI Devin [name] is a 37 y.o. male with history of polysubstance abuse, presents to emergency department complaining of suicidal thoughts.  Patient states that he relapsed on cocaine and alcohol 2 days ago after being 4 months clean.  He states his last use was early this morning.  He states he went to [facility] to be evaluated and requesting detox but he was denied and given outpatient resources.  He states after going home he has developed suicidal thoughts.  He states he no longer wants to live if he cannot get help.  He states his plan is to overdose on cocaine until "my body explodes."  He denies any HI.  No medical complaints at this time although admits to having asthma problems earlier in the day.  Past Medical History:  Diagnosis Date  . ADHD (attention deficit hyperactivity disorder)   . Asthma   . GERD (gastroesophageal reflux disease)   . Transaminitis 07/17/2015    Patient Active Problem List   Diagnosis Date Noted  . Cocaine abuse with cocaine-induced mood disorder (HCC) 03/22/2017  . Hypoxia   . COPD exacerbation (HCC)   . Leukocytosis 11/04/2015  . Transaminitis 07/17/2015  . Tooth ache 07/17/2015  . Asthma with status asthmaticus 10/14/2011  . ADHD (attention deficit hyperactivity disorder) 10/14/2011    Past Surgical History:  Procedure Laterality Date  . TYMPANOPLASTY Left 1997       Home Medications    Prior to Admission medications   Medication Sig Start Date End Date Taking? Authorizing Provider  albuterol (PROVENTIL HFA;VENTOLIN HFA) 108 (90 Base) MCG/ACT inhaler Inhale 1-2 puffs into the lungs every 6 (six) hours as needed for wheezing or shortness of breath. Patient not taking: Reported on 06/28/2017 03/21/17    Maczis, Elmer SowMichael M, PA-C  albuterol (PROVENTIL) (2.5 MG/3ML) 0.083% nebulizer solution Take 3 mLs (2.5 mg total) by nebulization every 6 (six) hours as needed for wheezing or shortness of breath. Patient not taking: Reported on 06/28/2017 04/24/17   Everlene Farrieransie, William, PA-C  azithromycin (ZITHROMAX) 250 MG tablet Take 1 tablet (250 mg total) by mouth daily. Take first 2 tablets together, then 1 every day until finished. Patient not taking: Reported on 06/28/2017 04/24/17   Everlene Farrieransie, William, PA-C  budesonide-formoterol [facility](SYMBICORT) 80-4.5 MCG/ACT inhaler Inhale 2 puffs into the lungs daily. Patient not taking: Reported on 10/01/2016 09/04/16   Arthor CaptainHarris, Abigail, PA-C  gabapentin (NEURONTIN) 300 MG capsule Take 1 capsule (300 mg total) by mouth 3 (three) times daily. Patient not taking: Reported on 06/28/2017 03/22/17   Charm RingsLord, Jamison Y, NP  predniSONE (DELTASONE) 10 MG tablet Take 4 tablets (40 mg total) by mouth daily for 5 days. 06/28/17 07/03/17  Liberty HandyGibbons, Claudia J, PA-C    Family History Family History  Problem Relation Age of Onset  . Coronary artery disease Father   . Heart attack Father   . Heart disease Father   . Heart failure Mother   . Hyperlipidemia Mother   . Hypertension Mother   . Migraines Mother   . COPD Mother   . Diabetes Maternal Aunt   . Asthma Maternal Grandfather   . Asthma Paternal Grandfather     Social History Social History   Tobacco Use  .  Smoking status: Current Every Day Smoker    Packs/day: 0.50    Types: Cigarettes  . Smokeless tobacco: Never Used  Substance Use Topics  . Alcohol use: Yes    Alcohol/week: 0.0 oz  . Drug use: Yes    Types: Marijuana, Cocaine    Comment:                       Allergies   Augmentin [amoxicillin-pot clavulanate] and Other   Review of Systems Review of Systems  Constitutional: Negative for chills and fever.  Respiratory: Positive for shortness of breath. Negative for cough and chest tightness.   Cardiovascular: Negative for  chest pain, palpitations and leg swelling.  Gastrointestinal: Negative for abdominal distention, abdominal pain, diarrhea, nausea and vomiting.  Genitourinary: Negative for dysuria, frequency, hematuria and urgency.  Musculoskeletal: Negative for arthralgias, myalgias, neck pain and neck stiffness.  Skin: Negative for rash.  Allergic/Immunologic: Negative for immunocompromised state.  Neurological: Negative for dizziness, weakness, light-headedness, numbness and headaches.  Psychiatric/Behavioral: Positive for dysphoric mood and suicidal ideas.  All other systems reviewed and are negative.    Physical Exam Updated Vital Signs BP (!) 130/99 (BP Location: Left Arm)   Pulse 78   Temp 98.6 F (37 C) (Oral)   Resp 18   Ht 5\' 8"  (1.727 m)   Wt 85.7 kg (189 lb)   SpO2 93%   BMI 28.74 kg/m   Physical Exam  Constitutional: He appears well-developed and well-nourished. No distress.  HENT:  Head: Normocephalic and atraumatic.  Eyes: Conjunctivae are normal.  Neck: Neck supple.  Cardiovascular: Normal rate, regular rhythm and normal heart sounds.  Pulmonary/Chest: Effort normal. No respiratory distress. He has no wheezes. He has no rales.  Abdominal: Soft. Bowel sounds are normal. He exhibits no distension. There is no tenderness. There is no rebound.  Musculoskeletal: He exhibits no edema.  Neurological: He is alert.  Skin: Skin is warm and dry.  Psychiatric:  Positive for SI  Nursing note and vitals reviewed.    ED Treatments / Results  Labs (all labs ordered are listed, but only abnormal results are displayed) Labs Reviewed  COMPREHENSIVE METABOLIC PANEL - Abnormal; Notable for the following components:      Result Value   Total Bilirubin 1.6 (*)    All other components within normal limits  CBC - Abnormal; Notable for the following components:   WBC 10.8 (*)    All other components within normal limits  ETHANOL  RAPID URINE DRUG SCREEN, HOSP PERFORMED    EKG  EKG  Interpretation None       Radiology Dg Chest 2 View  Result Date: 06/28/2017 CLINICAL DATA:  Shortness of breath and dry cough. EXAM: CHEST - 2 VIEW COMPARISON:  Chest x-ray dated May 16, 2017. FINDINGS: The heart size and mediastinal contours are within normal limits. Both lungs are clear. The visualized skeletal structures are unremarkable. IMPRESSION: No active cardiopulmonary disease. Electronically Signed   By: Obie Dredge M.D.   On: 06/28/2017 13:29    Procedures Procedures (including critical care time)  Medications Ordered in ED Medications  LORazepam (ATIVAN) injection 0-4 mg (not administered)    Or  LORazepam (ATIVAN) tablet 0-4 mg (not administered)  LORazepam (ATIVAN) injection 0-4 mg (not administered)    Or  LORazepam (ATIVAN) tablet 0-4 mg (not administered)  thiamine (VITAMIN B-1) tablet 100 mg (not administered)    Or  thiamine (B-1) injection 100 mg (not administered)  ondansetron (  ZOFRAN) tablet 4 mg (not administered)  alum & mag hydroxide-simeth (MAALOX/MYLANTA) 200-200-20 MG/5ML suspension 30 mL (not administered)  nicotine (NICODERM CQ - dosed in mg/24 hours) patch 21 mg (not administered)  ibuprofen (ADVIL,MOTRIN) tablet 600 mg (not administered)     Initial Impression / Assessment and Plan / ED Course  I have reviewed the triage vital signs and the nursing notes.  Pertinent labs & imaging results that were available during my care of the patient were reviewed by me and considered in my medical decision making (see chart for details).     Patient with suicidal ideations and polysubstance abuse requesting detox.  Patient was seen this morning by Child psychotherapist and was given resources.  He denied any SI at that time.  He states he does plan to overdose on medications and cocaine if he does not get any help.  I will get TTS to assess him.  He is otherwise medically cleared  10:37 PM Assessed by TTS, will try placing in [facility] in am.   Vitals:    06/28/17 1929 06/28/17 1931 06/28/17 2153 06/28/17 2156  BP: (!) 130/99  (!) 131/104 (!) 131/104  Pulse: 78  87 87  Resp: 18  18   Temp: 98.6 F (37 C)     TempSrc: Oral     SpO2: 93%  94%   Weight:  85.7 kg (189 lb)    Height:  5\' 8"  (1.727 m)       Final Clinical Impressions(s) / ED Diagnoses   Final diagnoses:  Suicidal ideation  Polysubstance abuse [facility])    ED Discharge Orders    None       [name], PA-C 06/28/17 2238    Mancel Bale, MD 06/28/17 2312

## 2017-06-28 NOTE — ED Notes (Signed)
SBAR Report received from previous nurse. Pt received calm and visible on unit. Pt endorses current SI, depression, anxiety,and pain at this time. Pt denies current HI, or A/ VH and appears otherwise stable and free of distress. Pt reminded of camera surveillance, q 15 min rounds, and rules of the milieu. Will continue to assess.

## 2017-06-28 NOTE — Discharge Instructions (Signed)
Use albuterol inhaler for chest tightness and shortness of breath. Prednisone for the next 5 days to help with airway inflammation. See attached resource guide for substance abuse residential clinics.

## 2017-06-28 NOTE — ED Triage Notes (Addendum)
To ED via [facility] for eval of sob past using 2 'eight balls of cocaine' and ETOH (20-25 beers) last pm- last drink being around 0300 today. Pt has been in recovery since 12/1. States he now feels shaky and sob. No cp. Pt denies SI/HI and states he just wants to go back to rehab

## 2017-06-28 NOTE — ED Triage Notes (Signed)
States about 3 hours ago states suicidal ideations and was just discharged [facility] for [facility] problems states he was going to use drugs till his heart exploded states wife just left him [facility].

## 2017-06-28 NOTE — ED Provider Notes (Addendum)
[facility] EMERGENCY DEPARTMENT Provider Note   CSN: [id number] Arrival date & time: 06/28/17  1110     History   Chief Complaint Chief Complaint  Patient presents with  . Shortness of Breath    HPI Devin KillingsMark Magley is a 37 y.o. male with history of asthma, tobacco abuse, cocaine abuse, EtOH abuse is here for evaluation of shortness of breath and chest tightness and wheezing since yesterday. States that he has been sober from cocaine and alcohol since December 2018 but relapsed 2 days ago and drank large quantities of alcohol with cocaine, last use was in the middle of the night last night. States that his girlfriend recently left him and this is what triggered his relapse. States that he is disappointment and feeling bad about recent relapse but but is he was not trying to hurt or kill himself or overdose. Last use of ETOH/cocaine was last night, woke up this morning having chest tightness and wheezing, constant. Has been nauseated since last night which he attributes to recent alcohol consumption. States that he was staying at a boarding house but he was kicked out due to last night's relapse and is now homeless again. [facility] where he was able to be detoxed. He would like to talk to Child psychotherapistsocial worker.   He has no exertional or pleuritic chest pain, palpitations, dizziness.  No fevers, chills, abdominal pain. Denies IV drug use. HPI  Past Medical History:  Diagnosis Date  . ADHD (attention deficit hyperactivity disorder)   . Asthma   . GERD (gastroesophageal reflux disease)   . Transaminitis 07/17/2015    Patient Active Problem List   Diagnosis Date Noted  . Cocaine abuse with cocaine-induced mood disorder (HCC) 03/22/2017  . Hypoxia   . COPD exacerbation (HCC)   . Leukocytosis 11/04/2015  . Transaminitis 07/17/2015  . Tooth ache 07/17/2015  . Asthma with status asthmaticus 10/14/2011  . ADHD (attention deficit hyperactivity  disorder) 10/14/2011    Past Surgical History:  Procedure Laterality Date  . TYMPANOPLASTY Left 1997       Home Medications    Prior to Admission medications   Medication Sig Start Date End Date Taking? Authorizing Provider  albuterol (PROVENTIL HFA;VENTOLIN HFA) 108 (90 Base) MCG/ACT inhaler Inhale 1-2 puffs into the lungs every 6 (six) hours as needed for wheezing or shortness of breath. Patient not taking: Reported on 06/28/2017 03/21/17   Maczis, Elmer SowMichael M, PA-C  albuterol (PROVENTIL) (2.5 MG/3ML) 0.083% nebulizer solution Take 3 mLs (2.5 mg total) by nebulization every 6 (six) hours as needed for wheezing or shortness of breath. Patient not taking: Reported on 06/28/2017 04/24/17   Everlene Farrieransie, William, PA-C  azithromycin (ZITHROMAX) 250 MG tablet Take 1 tablet (250 mg total) by mouth daily. Take first 2 tablets together, then 1 every day until finished. Patient not taking: Reported on 06/28/2017 04/24/17   Everlene Farrieransie, William, PA-C  budesonide-formoterol [facility](SYMBICORT) 80-4.5 MCG/ACT inhaler Inhale 2 puffs into the lungs daily. Patient not taking: Reported on 10/01/2016 09/04/16   Arthor CaptainHarris, Abigail, PA-C  gabapentin (NEURONTIN) 300 MG capsule Take 1 capsule (300 mg total) by mouth 3 (three) times daily. Patient not taking: Reported on 06/28/2017 03/22/17   Charm RingsLord, Jamison Y, NP  predniSONE (DELTASONE) 10 MG tablet Take 4 tablets (40 mg total) by mouth daily for 5 days. 06/28/17 07/03/17  Liberty HandyGibbons, Sayge Brienza J, PA-C    Family History Family History  Problem Relation Age of Onset  . Coronary artery  disease Father   . Heart attack Father   . Heart disease Father   . Heart failure Mother   . Hyperlipidemia Mother   . Hypertension Mother   . Migraines Mother   . COPD Mother   . Diabetes Maternal Aunt   . Asthma Maternal Grandfather   . Asthma Paternal Grandfather     Social History Social History   Tobacco Use  . Smoking status: Current Every Day Smoker    Packs/day: 0.50    Types: Cigarettes  .  Smokeless tobacco: Never Used  Substance Use Topics  . Alcohol use: Yes    Alcohol/week: 0.0 oz  . Drug use: Yes    Types: Marijuana, Cocaine    Comment:                       Allergies   Augmentin [amoxicillin-pot clavulanate] and Other   Review of Systems Review of Systems  Respiratory: Positive for chest tightness, shortness of breath and wheezing.   Psychiatric/Behavioral: Positive for dysphoric mood.  All other systems reviewed and are negative.    Physical Exam Updated Vital Signs BP 124/78   Pulse 89   Temp 98.6 F (37 C) (Oral)   Resp 19   SpO2 94%   Physical Exam  Constitutional: He is oriented to person, place, and time. He appears well-developed and well-nourished. No distress.  Non toxic.  HENT:  Head: Normocephalic and atraumatic.  Nose: Nose normal.  Mouth/Throat: No oropharyngeal exudate.  Moist mucous membranes   Eyes: Conjunctivae and EOM are normal. Pupils are equal, round, and reactive to light.  Neck: Normal range of motion.  Cardiovascular: Normal rate, regular rhythm, normal heart sounds and intact distal pulses.  No murmur heard. 2+ DP and radial pulses bilaterally. No LE edema.   Pulmonary/Chest: Effort normal. He has wheezes.  Diffuse expiratory wheezing in all lung fields. No rales. Normal WOB.   Abdominal: Soft. Bowel sounds are normal. There is no tenderness.  No G/R/R. No suprapubic or CVA tenderness.   Musculoskeletal: Normal range of motion. He exhibits no deformity.  Neurological: He is alert and oriented to person, place, and time.  Skin: Skin is warm and dry. Capillary refill takes less than 2 seconds.  Psychiatric: He has a normal mood and affect. His behavior is normal. Thought content normal. Cognition and memory are normal.  Patient appears anxious. Speech is normal. He is engaged. Denies SI, HI, AVH.   Nursing note and vitals reviewed.    ED Treatments / Results  Labs (all labs ordered are listed, but only abnormal  results are displayed) Labs Reviewed  BASIC METABOLIC PANEL - Abnormal; Notable for the following components:      Result Value   Sodium 134 (*)    Chloride 99 (*)    CO2 20 (*)    Glucose, Bld 100 (*)    All other components within normal limits  CBC - Abnormal; Notable for the following components:   WBC 12.4 (*)    Platelets 413 (*)    All other components within normal limits  HEPATIC FUNCTION PANEL - Abnormal; Notable for the following components:   Indirect Bilirubin 1.0 (*)    All other components within normal limits  ACETAMINOPHEN LEVEL - Abnormal; Notable for the following components:   Acetaminophen (Tylenol), Serum <10 (*)    All other components within normal limits  SALICYLATE LEVEL  I-STAT TROPONIN, ED    EKG  EKG Interpretation None  Radiology Dg Chest 2 View  Result Date: 06/28/2017 CLINICAL DATA:  Shortness of breath and dry cough. EXAM: CHEST - 2 VIEW COMPARISON:  Chest x-ray dated May 16, 2017. FINDINGS: The heart size and mediastinal contours are within normal limits. Both lungs are clear. The visualized skeletal structures are unremarkable. IMPRESSION: No active cardiopulmonary disease. Electronically Signed   By: Obie Dredge M.D.   On: 06/28/2017 13:29    Procedures Procedures (including critical care time)  Medications Ordered in ED Medications  ondansetron (ZOFRAN) injection 4 mg (4 mg Intravenous Given 06/28/17 1332)  ipratropium-albuterol (DUONEB) 0.5-2.5 (3) MG/3ML nebulizer solution 3 mL (3 mLs Nebulization Given 06/28/17 1332)  albuterol (PROVENTIL HFA;VENTOLIN HFA) 108 (90 Base) MCG/ACT inhaler 1-2 puff (2 puffs Inhalation Given 06/28/17 1538)     Initial Impression / Assessment and Plan / ED Course  I have reviewed the triage vital signs and the nursing notes.  Pertinent labs & imaging results that were available during my care of the patient were reviewed by me and considered in my medical decision making (see chart for  details).  Clinical Course as of Jun 28 1608  Sat Jun 28, 2017  1552 Re-evaluated pt. Wheezing improved. Unable to reach social work in ED for 2 hrs. Able to contact inpatient social worker Osborne Casco who will come see patient.    [CG]    Clinical Course User Index [CG] Liberty Handy, PA-C   37 year old male is here for shortness of breath, chest tightness and wheezing in setting of history of asthma as well as recent heavy use of EtOH and cocaine. States symptoms today are consistent with previous asthma flares. He denies any chest pain, suicidal ideations, homicidal ideations. He is hoping to get help to go back to rehabilitation, and is requesting Child psychotherapist. No fevers.  Exam as above with expiratory wheezing, improved after breathing treatments. Lab work andchest x-ray and EKG are reviewed, remarkable for mild leukocytosis at 12.4. Troponin negative. Chest x-ray without infiltrate. EKG without ischemia or new changes. He is ambulatory without SOB.   Final Clinical Impressions(s) / ED Diagnoses   Delay in social work consult. Inpatient SW evaluated patient and offered to call [facility] rehab. Medically pt is deemed appropriate for discharge. His SOB and wheezing have improved. He has no h/o withdrawal symptoms, is clinically sober and without any signs of imminent withdrawal. He denies SI/HI/AVH that would warrant psych evaluation. No documented h/o suicide. Social work has seen and provided resources. Awaiting if pt will be accepted to [facility] rehab. Pt given albuterol in ED, rx for prednisone. Discussed return precautions.  Final diagnoses:  Mild intermittent asthma with exacerbation  Polysubstance abuse [facility])  Homelessness    ED Discharge Orders        Ordered    predniSONE (DELTASONE) 10 MG tablet  Daily     06/28/17 1609        Liberty Handy, [place] 06/28/17 1618    Doug Sou, MD 06/28/17 2352

## 2017-06-28 NOTE — ED Notes (Signed)
Bed: [facility]4 Expected date:  Expected time:  Means of arrival:  Comments: 
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
## 2017-06-28 NOTE — ED Notes (Signed)
Patient transported to vascular. 

## 2017-06-28 NOTE — ED Notes (Signed)
Patient transported to X-ray 

## 2017-06-29 DIAGNOSIS — F1414 Cocaine abuse with cocaine-induced mood disorder: Secondary | ICD-10-CM

## 2017-06-29 DIAGNOSIS — F17210 Nicotine dependence, cigarettes, uncomplicated: Secondary | ICD-10-CM

## 2017-06-29 DIAGNOSIS — Z79899 Other long term (current) drug therapy: Secondary | ICD-10-CM

## 2017-06-29 NOTE — B[facility] Suicide Risk Assessment (Signed)
Suicide Risk Assessment  Discharge Assessment   [facility]   Principal Problem: Cocaine abuse with cocaine-induced mood disorder [facility](HCC) Discharge Diagnoses:  Patient Active Problem List   Diagnosis Date Noted  . Cocaine abuse with cocaine-induced mood disorder [facility](HCC) [F14.14] 03/22/2017    Priority: High  . Hypoxia [R09.02]   . COPD exacerbation (HCC) [J44.1]   . Leukocytosis [D72.829] 11/04/2015  . Transaminitis [R74.0] 07/17/2015  . Tooth ache [K08.89] 07/17/2015  . Asthma with status asthmaticus [J45.902] 10/14/2011  . ADHD (attention deficit hyperactivity disorder) [F90.9] 10/14/2011    Total Time spent with patient: 45 minutes   Musculoskeletal: Strength & Muscle Tone: within normal limits Gait & Station: normal Patient leans: N/A  Psychiatric Specialty Exam: Physical Exam  Constitutional: He is oriented to person, place, and time. He appears well-developed and well-nourished.  HENT:  Head: Normocephalic.  Neck: Normal range of motion.  Respiratory: Effort normal.  Musculoskeletal: Normal range of motion.  Neurological: He is alert and oriented to person, place, and time.  Psychiatric: He has a normal mood and affect. His speech is normal and behavior is normal. Judgment and thought content normal. Cognition and memory are normal.    Review of Systems  Psychiatric/Behavioral: Positive for substance abuse.  All other systems reviewed and are negative.   Blood pressure (!) 131/104, pulse 87, temperature 98.6 F (37 C), temperature source Oral, resp. rate 18, height 5\' 8"  (1.727 m), weight 85.7 kg (189 lb), SpO2 94 %.Body mass index is 28.74 kg/m.  General Appearance: Casual  Eye Contact:  Good  Speech:  Normal Rate  Volume:  Normal  Mood:  Euthymic  Affect:  Congruent  Thought Process:  Coherent and Descriptions of Associations: Intact  Orientation:  Full (Time, Place, and Person)  Thought Content:  WDL and Logical  Suicidal Thoughts:   No  Homicidal Thoughts:  No  Memory:  Immediate;   Good Recent;   Good Remote;   Fair  Judgement:  Fair  Insight:  Fair  Psychomotor Activity:  Normal  Concentration:  Concentration: Good and Attention Span: Good  Recall:  Good  Fund of Knowledge:  Fair  Language:  Good  Akathisia:  No  Handed:  Right  AIMS (if indicated):     Assets:  Leisure Time Physical Health Resilience Social Support  ADL's:  Intact  Cognition:  WNL  Sleep:      Mental Status Per Nursing Assessment::   On Admission:   cocaine abuse with suicidal ideations  Demographic Factors:  Male and Caucasian  Loss Factors: NA  Historical Factors: None  Risk Reduction Factors:   Sense of responsibility to family and Positive social support  Continued Clinical Symptoms:  None  Cognitive Features That Contribute To Risk:  None    Suicide Risk:  Minimal: No identifiable suicidal ideation.  Patients presenting with no risk factors but with morbid ruminations; may be classified as minimal risk based on the severity of the depressive symptoms    Plan Of Care/Follow-up recommendations:  Activity:  as tolerated Diet:  heart healthy diet  LORD, JAMISON, NP 06/29/2017, 10:45 AM

## 2017-06-29 NOTE — BH Assessment (Addendum)
BHH Assessment Progress Note    Per Dr. Jannifer FranklinAkintayo, Patient will be discharged from the ED with outpatient referrals.  However, [facility].

## 2017-06-29 NOTE — BH Assessment (Signed)
[facility] Assessment Progress Note    TTS spoke to Marquette OldSharon Slinger, Charity fundraiserN at [facility].  Patient does not qualify for detox and residential services based on his history of use and is recommended for Intensive Outpatient Treatment.

## 2017-06-29 NOTE — ED Notes (Signed)
Time change from 2-3 AM

## 2017-06-29 NOTE — ED Notes (Signed)
Patient sleeping all morning.  He states that he is no longer have thoughts of self harm.  He states, "I just said that so I could get some sleep last night."

## 2017-06-29 NOTE — Consult Note (Addendum)
[facility] Face-to-Face Psychiatry Consult   Reason for Consult:  Cocaine abuse with suicidal ideations Referring Physician:  EDP Patient Identification: Devin [name] MRN:  [id number] Principal Diagnosis: Cocaine abuse with cocaine-induced mood disorder [facility]) Diagnosis:   Patient Active Problem List   Diagnosis Date Noted  . Cocaine abuse with cocaine-induced mood disorder [facility]) [F14.14] 03/22/2017    Priority: High  . Hypoxia [R09.02]   . COPD exacerbation ([place]) [J44.1]   . Leukocytosis [D72.829] 11/04/2015  . Transaminitis [R74.0] 07/17/2015  . Tooth ache [K08.89] 07/17/2015  . Asthma with status asthmaticus [J45.902] 10/14/2011  . ADHD (attention deficit hyperactivity disorder) [F90.9] 10/14/2011    Total Time spent with patient: 45 minutes  Subjective:   Devin [name] is a 37 y.o. male patient does not warrant admission.  HPI:  37 yo male who presented to the ED after using cocaine with suicidal ideations.  Today, he reported he said he was suicidal "to get some sleep."  No suicidal/homicidal ideations, hallucinations, or withdrawal symptoms.  Rehab information, [facility], will be provided for him to follow-up with after discharge.  Stable for discharge.  Past Psychiatric History: substance abuse  Risk to Self: None Risk to Others: Homicidal Ideation: No Thoughts of Harm to Others: No Current Homicidal Intent: No Current Homicidal Plan: No Access to Homicidal Means: No Identified Victim: No one History of harm to others?: No Assessment of Violence: None Noted Violent Behavior Description: "none at all" Does patient have access to weapons?: No Criminal Charges Pending?: No Does patient have a court date: No Prior Inpatient Therapy: Prior Inpatient Therapy: Yes Prior Therapy Dates: 03-22-17 Prior Therapy Facilty/Provider(s): [facility] Reason for Treatment: rehab Prior Outpatient Therapy: Prior Outpatient Therapy: Yes Prior Therapy Dates: 4-5 years ago / Current Prior Therapy  Facilty/Provider(s): [facility] / [facility] meetings currently Reason for Treatment: meds / support group Does patient have an ACCT team?: No Does patient have Intensive In-House Services?  : No Does patient have [facility] services? : No Does patient have P4CC services?: No  Past Medical History:  Past Medical History:  Diagnosis Date  . ADHD (attention deficit hyperactivity disorder)   . Asthma   . GERD (gastroesophageal reflux disease)   . Transaminitis 07/17/2015    Past Surgical History:  Procedure Laterality Date  . TYMPANOPLASTY Left 1997   Family History:  Family History  Problem Relation Age of Onset  . Coronary artery disease Father   . Heart attack Father   . Heart disease Father   . Heart failure Mother   . Hyperlipidemia Mother   . Hypertension Mother   . Migraines Mother   . COPD Mother   . Diabetes Maternal Aunt   . Asthma Maternal Grandfather   . Asthma Paternal Grandfather    Family Psychiatric  History: none Social History:  Social History   Substance and Sexual Activity  Alcohol Use Yes  . Alcohol/week: 0.0 oz     Social History   Substance and Sexual Activity  Drug Use Yes  . Types: Marijuana, Cocaine   Comment:                      Social History   Socioeconomic History  . Marital status: Single    Spouse name: None  . Number of children: None  . Years of education: None  . Highest education level: None  Social Needs  . Financial resource strain: None  . Food insecurity - worry: None  . Food insecurity - inability: None  .  Transportation needs - medical: None  . Transportation needs - non-medical: None  Occupational History  . 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 Smoking status: Current Every Day Smoker    Packs/day: 0.50    Types: Cigarettes  . Smokeless tobacco: Never Used  Substance and Sexual Activity  . Alcohol use: Yes    Alcohol/week: 0.0 oz  . Drug use: Yes    Types: Marijuana, Cocaine    Comment:                     . Sexual activity: Not Currently  Other Topics Concern  . None  Social History Narrative  . None   Additional Social History:    Allergies:   Allergies  Allergen Reactions  . Augmentin [Amoxicillin-Pot Clavulanate] Hives    Tolerates Amoxicillin - Thuy 09/29/12  . Other Nausea And Vomiting    coconut    Labs:  Results for orders placed or performed during the hospital encounter of 06/28/17 (from the past 48 hour(s))  Rapid urine drug screen (hospital performed)     Status: Abnormal   Collection Time: 06/28/17  7:36 PM  Result Value Ref Range   Opiates NONE DETECTED NONE DETECTED   Cocaine POSITIVE (A) NONE DETECTED   Benzodiazepines NONE DETECTED NONE DETECTED   Amphetamines NONE DETECTED NONE DETECTED   Tetrahydrocannabinol POSITIVE (A) NONE DETECTED   Barbiturates NONE DETECTED NONE DETECTED    Comment: (NOTE) DRUG SCREEN FOR MEDICAL PURPOSES ONLY.  IF CONFIRMATION IS NEEDED FOR ANY PURPOSE, NOTIFY LAB WITHIN 5 DAYS. LOWEST DETECTABLE LIMITS FOR URINE DRUG SCREEN Drug Class                     Cutoff (ng/mL) Amphetamine and metabolites    1000 Barbiturate and metabolites    200 Benzodiazepine                 161 Tricyclics and metabolites     300 Opiates and metabolites        300 Cocaine and metabolites        300 THC                            50 Performed at [facility], [place] [address], [place], [place] [address]   Comprehensive metabolic panel     Status: Abnormal   Collection Time: 06/28/17  7:41 PM  Result Value Ref Range   Sodium 137 135 - 145 mmol/L   Potassium 3.9 3.5 - 5.1 mmol/L   Chloride 101 101 - 111 mmol/L   CO2 24 22 - 32 mmol/L   Glucose, Bld 97 65 - 99 mg/dL   BUN 14 6 - 20 mg/dL   Creatinine, Ser 1.21 0.61 - 1.24 mg/dL   Calcium 9.4 8.9 - 10.3 mg/dL   Total Protein 7.5 6.5 - 8.1 g/dL   Albumin 4.3 3.5 - 5.0 g/dL   AST 30 15 - 41 U/L   ALT 25 17 - 63 U/L   Alkaline Phosphatase 59 38 - 126 U/L   Total Bilirubin 1.6  (H) 0.3 - 1.2 mg/dL   GFR calc non Af Amer >60 >60 mL/min   GFR calc Af Amer >60 >60 mL/min    Comment: (NOTE) The eGFR has been calculated using the CKD EPI equation. This calculation has not been validated in all clinical situations. eGFR's persistently <60 mL/min signify possible  Chronic Kidney Disease.    Anion gap 12 5 - 15    Comment: Performed at [facility], [place] [address] [place], [place] [address]  Ethanol     Status: None   Collection Time: 06/28/17  7:41 PM  Result Value Ref Range   Alcohol, Ethyl (B) <10 <10 mg/dL    Comment:        LOWEST DETECTABLE LIMIT FOR SERUM ALCOHOL IS 10 mg/dL FOR MEDICAL PURPOSES ONLY Performed at [facility], [place] [address] [place], [place] [address]   cbc     Status: Abnormal   Collection Time: 06/28/17  7:41 PM  Result Value Ref Range   WBC 10.8 (H) 4.0 - 10.5 K/uL   RBC 4.84 4.22 - 5.81 MIL/uL   Hemoglobin 14.7 13.0 - 17.0 g/dL   HCT 43.3 39.0 - 52.0 %   MCV 89.5 78.0 - 100.0 fL   MCH 30.4 26.0 - 34.0 pg   MCHC 33.9 30.0 - 36.0 g/dL   RDW 13.3 11.5 - 15.5 %   Platelets 386 150 - 400 K/uL    Comment: Performed at [facility], [place] [address] [place], [place] [address]    Current Facility-Administered Medications  Medication Dose Route Frequency Provider Last Rate Last Dose  . albuterol (PROVENTIL HFA;VENTOLIN HFA) 108 (90 Base) MCG/ACT inhaler 2 puff  2 puff Inhalation Q4H PRN Kirichenko, Tatyana, PA-C   2 puff at 06/28/17 2326  . alum & mag hydroxide-simeth (MAALOX/MYLANTA) 200-200-20 MG/5ML suspension 30 mL  30 mL Oral Q6H PRN Kirichenko, Tatyana, PA-C      . ibuprofen (ADVIL,MOTRIN) tablet 600 mg  600 mg Oral Q8H PRN Kirichenko, Tatyana, PA-C      . nicotine (NICODERM CQ - dosed in mg/24 hours) patch 21 mg  21 mg Transdermal Daily Kirichenko, Tatyana, PA-C      . ondansetron (ZOFRAN) tablet 4 mg  4 mg Oral Q8H PRN Jeannett Senior, PA-C       Current Outpatient  Medications  Medication Sig Dispense Refill  . albuterol (PROVENTIL HFA;VENTOLIN HFA) 108 (90 Base) MCG/ACT inhaler Inhale 1-2 puffs into the lungs every 6 (six) hours as needed for wheezing or shortness of breath. (Patient not taking: Reported on 06/28/2017) 1 Inhaler 0  . albuterol (PROVENTIL) (2.5 MG/3ML) 0.083% nebulizer solution Take 3 mLs (2.5 mg total) by nebulization every 6 (six) hours as needed for wheezing or shortness of breath. (Patient not taking: Reported on 06/28/2017) 75 mL 1  . azithromycin (ZITHROMAX) 250 MG tablet Take 1 tablet (250 mg total) by mouth daily. Take first 2 tablets together, then 1 every day until finished. (Patient not taking: Reported on 06/28/2017) 6 tablet 0  . budesonide-formoterol (SYMBICORT) 80-4.5 MCG/ACT inhaler Inhale 2 puffs into the lungs daily. (Patient not taking: Reported on 10/01/2016) 1 Inhaler 12  . gabapentin (NEURONTIN) 300 MG capsule Take 1 capsule (300 mg total) by mouth 3 (three) times daily. (Patient not taking: Reported on 06/28/2017) 90 capsule 0  . predniSONE (DELTASONE) 10 MG tablet Take 4 tablets (40 mg total) by mouth daily for 5 days. 20 tablet 0    Musculoskeletal: Strength & Muscle Tone: within normal limits Gait & Station: normal Patient leans: N/A  Psychiatric Specialty Exam: Physical Exam  Constitutional: He is oriented to person, place, and time. He appears well-developed and well-nourished.  HENT:  Head: Normocephalic.  Neck: Normal range of motion.  Respiratory: Effort normal.  Musculoskeletal: Normal range of motion.  Neurological:  He is alert and oriented to person, place, and time.  Psychiatric: He has a normal mood and affect. His speech is normal and behavior is normal. Judgment and thought content normal. Cognition and memory are normal.    Review of Systems  Psychiatric/Behavioral: Positive for substance abuse.  All other systems reviewed and are negative.   Blood pressure (!) 131/104, pulse 87, temperature 98.6 F  (37 C), temperature source Oral, resp. rate 18, height '5\' 8"'$  (1.727 m), weight 85.7 kg (189 lb), SpO2 94 %.Body mass index is 28.74 kg/m.  General Appearance: Casual  Eye Contact:  Good  Speech:  Normal Rate  Volume:  Normal  Mood:  Euthymic  Affect:  Congruent  Thought Process:  Coherent and Descriptions of Associations: Intact  Orientation:  Full (Time, Place, and Person)  Thought Content:  WDL and Logical  Suicidal Thoughts:  No  Homicidal Thoughts:  No  Memory:  Immediate;   Good Recent;   Good Remote;   Fair  Judgement:  Fair  Insight:  Fair  Psychomotor Activity:  Normal  Concentration:  Concentration: Good and Attention Span: Good  Recall:  Good  Fund of Knowledge:  Fair  Language:  Good  Akathisia:  No  Handed:  Right  AIMS (if indicated):     Assets:  Leisure Time Physical Health Resilience Social Support  ADL's:  Intact  Cognition:  WNL  Sleep:        Treatment Plan Summary: Daily contact with patient to assess and evaluate symptoms and progress in treatment, Medication management and Plan cocaine abuse with cocaine induced mood disorder:  -Crisis stabilization -Medication management:  None started as he needed to clear the cocaine -Individual and substance abuse counseling  Disposition: No evidence of imminent risk to self or others at present.    Waylan Boga, NP 06/29/2017 10:31 AM  Patient seen face-to-face for psychiatric evaluation, chart reviewed and case discussed with the physician extender and developed treatment plan. Reviewed the information documented and agree with the treatment plan. Corena Pilgrim, MD

## 2017-08-05 ENCOUNTER — Emergency Department (HOSPITAL_COMMUNITY)
Admission: EM | Admit: 2017-08-05 | Discharge: 2017-08-05 | Disposition: A | Discharge status: Home / Self Care | Payer: Self-pay | Attending: Emergency Medicine | Admitting: Emergency Medicine

## 2017-08-05 ENCOUNTER — Other Ambulatory Visit: Payer: Self-pay

## 2017-08-05 ENCOUNTER — Encounter (HOSPITAL_COMMUNITY): Payer: Self-pay | Admitting: Emergency Medicine

## 2017-08-05 DIAGNOSIS — F909 Attention-deficit hyperactivity disorder, unspecified type: Secondary | ICD-10-CM | POA: Insufficient documentation

## 2017-08-05 DIAGNOSIS — F17210 Nicotine dependence, cigarettes, uncomplicated: Secondary | ICD-10-CM | POA: Insufficient documentation

## 2017-08-05 DIAGNOSIS — J111 Influenza due to unidentified influenza virus with other respiratory manifestations: Secondary | ICD-10-CM | POA: Insufficient documentation

## 2017-08-05 DIAGNOSIS — J449 Chronic obstructive pulmonary disease, unspecified: Secondary | ICD-10-CM | POA: Insufficient documentation

## 2017-08-05 DIAGNOSIS — R69 Illness, unspecified: Secondary | ICD-10-CM

## 2017-08-05 MED ORDER — ONDANSETRON 4 MG PO TBDP: 4.0000 mg | ORAL_TABLET | Freq: Three times a day (TID) | ORAL | 0 refills | Status: DC | PRN

## 2017-08-05 MED ORDER — NAPROXEN 500 MG PO TABS: 500.0000 mg | ORAL_TABLET | Freq: Two times a day (BID) | ORAL | 0 refills | Status: DC

## 2017-08-05 MED ORDER — FLUTICASONE PROPIONATE 50 MCG/ACT NA SUSP: 1.0000 | Freq: Every day | NASAL | 2 refills | Status: DC

## 2017-08-05 MED ORDER — BENZONATATE 100 MG PO CAPS: 200.0000 mg | ORAL_CAPSULE | Freq: Three times a day (TID) | ORAL | 0 refills | Status: DC

## 2017-08-05 NOTE — ED Notes (Addendum)
Pt reports he ahs asthma and feels like he may have the flu, Denies receiving flu shot this year. Pt reports using his inhaler 2X in waiting room and once at 8 am today.

## 2017-08-05 NOTE — ED Provider Notes (Signed)
[facility]ENC[facility] Provider Note   CSN: [id number] Arrival date & time: 08/05/17  1248     History   Chief Complaint Chief Complaint  Patient presents with  . Influenza    HPI Devin KillingsMark [name] is a 37 y.o. male with a past medical history of ADHD, asthma, who presents to ED for evaluation of 3-day history of influenza-like symptoms including generalized body aches, nausea, dry hacking cough, nasal congestion and subjective fevers.  He has been taking Tylenol with improvement in his symptoms.  He has also been using his albuterol inhaler with improvement in the cough.  Denies any wheezing, trouble breathing or trouble swallowing, sore throat, vomiting.  Possible sick contacts at friend's house over the weekend.  Did not receive influenza vaccine this year.  HPI  Past Medical History:  Diagnosis Date  . ADHD (attention deficit hyperactivity disorder)   . Asthma   . GERD (gastroesophageal reflux disease)   . Transaminitis 07/17/2015    Patient Active Problem List   Diagnosis Date Noted  . Cocaine abuse with cocaine-induced mood disorder ([facility]) 03/22/2017  . Hypoxia   . COPD exacerbation ([facility])   . Leukocytosis 11/04/2015  . Transaminitis 07/17/2015  . Tooth ache 07/17/2015  . Asthma with status asthmaticus 10/14/2011  . ADHD (attention deficit hyperactivity disorder) 10/14/2011    Past Surgical History:  Procedure Laterality Date  . TYMPANOPLASTY Left 1997        Home Medications    Prior to Admission medications   Medication Sig Start Date End Date Taking? Authorizing Provider  benzonatate (TESSALON) 100 MG capsule Take 2 capsules (200 mg total) by mouth every 8 (eight) hours. 08/05/17   Shiela Bruns, PA-C  fluticasone (FLONASE) 50 MCG/ACT nasal spray Place 1 spray into both nostrils daily. 08/05/17   Jenesis Martin, PA-C  naproxen (NAPROSYN) 500 MG tablet Take 1 tablet (500 mg total) by mouth 2 (two) times daily. 08/05/17   Lavaun Greenfield, PA-C    ondansetron (ZOFRAN ODT) 4 MG disintegrating tablet Take 1 tablet (4 mg total) by mouth every 8 (eight) hours as needed for nausea or vomiting. 08/05/17   Dietrich PatesKhatri, Jaryn Hocutt, PA-C    Family History Family History  Problem Relation Age of Onset  . Coronary artery disease Father   . Heart attack Father   . Heart disease Father   . Heart failure Mother   . Hyperlipidemia Mother   . Hypertension Mother   . Migraines Mother   . COPD Mother   . Diabetes Maternal Aunt   . Asthma Maternal Grandfather   . Asthma Paternal Grandfather     Social History Social History   Tobacco Use  . Smoking status: Current Every Day Smoker    Packs/day: 0.50    Types: Cigarettes  . Smokeless tobacco: Never Used  Substance Use Topics  . Alcohol use: Yes    Alcohol/week: 0.0 oz  . Drug use: Yes    Types: Marijuana, Cocaine    Comment:                       Allergies   Augmentin [amoxicillin-pot clavulanate] and Other   Review of Systems Review of Systems  Constitutional: Positive for chills. Negative for fever.  HENT: Positive for congestion and rhinorrhea. Negative for ear discharge, ear pain, sinus pressure, sore throat, trouble swallowing and voice change.   Respiratory: Positive for cough. Negative for shortness of breath and wheezing.   Gastrointestinal: Positive for nausea.  Negative for vomiting.  Musculoskeletal: Positive for myalgias.     Physical Exam Updated Vital Signs BP 120/76 (BP Location: Right Arm)   Pulse 93   Temp 98.2 F (36.8 C) (Oral)   Resp 18   SpO2 99%   Physical Exam  Constitutional: He appears well-developed and well-nourished. No distress.  Nontoxic appearing and in no acute distress. Dry cough noted.  HENT:  Head: Normocephalic and atraumatic.  Right Ear: Tympanic membrane normal.  Left Ear: Tympanic membrane normal.  Nose: Nose normal.  Mouth/Throat: Uvula is midline. No trismus in the jaw. No uvula swelling. Tonsils are 0 on the right. Tonsils are 0 on  the left. No tonsillar exudate.  Eyes: Conjunctivae and EOM are normal. Left eye exhibits no discharge. No scleral icterus.  Neck: Normal range of motion. Neck supple.  Cardiovascular: Normal rate, regular rhythm, normal heart sounds and intact distal pulses. Exam reveals no gallop and no friction rub.  No murmur heard. Pulmonary/Chest: Effort normal and breath sounds normal. No respiratory distress.  No wheezing noted.  Abdominal: Soft. Bowel sounds are normal. He exhibits no distension. There is no tenderness. There is no guarding.  Musculoskeletal: Normal range of motion. He exhibits no edema.  Neurological: He is alert. He exhibits normal muscle tone. Coordination normal.  Skin: Skin is warm and dry. No rash noted.  Psychiatric: He has a normal mood and affect.  Nursing note and vitals reviewed.    ED Treatments / Results  Labs (all labs ordered are listed, but only abnormal results are displayed) Labs Reviewed - No data to display  EKG None  Radiology No results found.  Procedures Procedures (including critical care time)  Medications Ordered in ED Medications - No data to display   Initial Impression / Assessment and Plan / ED Course  I have reviewed the triage vital signs and the nursing notes.  Pertinent labs & imaging results that were available during my care of the patient were reviewed by me and considered in my medical decision making (see chart for details).     Patient presents to ED for evaluation of influenza like illness for the past 3 days. He reports subjective fever, generalized bodyaches, nausea, dry cough, congestion. Possible sick contacts with similar symptoms. He is afebrile here. Lungs CTAB. Patient states he has inhaler which has helped. Declined breathing treatment. Patient out of window for Tamiflu administration, but will treat symptomatically with anti-inflammatories, antitussives, Flonase, antiemetics. Advised to return for any severe or  worsening symptoms.  Portions of this note were generated with Scientist, clinical (histocompatibility and immunogenetics). Dictation errors may occur despite best attempts at proofreading.   Final Clinical Impressions(s) / ED Diagnoses   Final diagnoses:  Influenza-like illness    ED Discharge Orders        Ordered    naproxen (NAPROSYN) 500 MG tablet  2 times daily     08/05/17 1526    benzonatate (TESSALON) 100 MG capsule  Every 8 hours     08/05/17 1526    ondansetron (ZOFRAN ODT) 4 MG disintegrating tablet  Every 8 hours PRN     08/05/17 1526    fluticasone (FLONASE) 50 MCG/ACT nasal spray  Daily     08/05/17 1526       [name], PA-C 08/05/17 1534    Wynetta Fines, MD 08/06/17 1303

## 2017-08-05 NOTE — ED Triage Notes (Signed)
Patient complains nausea, body aches, and cough for 2 days. Patient denies being around anyone that was flu positive that he knows of. Denies taking any medications to treat symptoms today.

## 2017-08-13 ENCOUNTER — Emergency Department (HOSPITAL_COMMUNITY)
Admission: EM | Admit: 2017-08-13 | Discharge: 2017-08-13 | Disposition: A | Discharge status: Home / Self Care | Payer: Self-pay | Attending: Emergency Medicine | Admitting: Emergency Medicine

## 2017-08-13 ENCOUNTER — Encounter (HOSPITAL_COMMUNITY): Payer: Self-pay | Admitting: Emergency Medicine

## 2017-08-13 DIAGNOSIS — J45901 Unspecified asthma with (acute) exacerbation: Secondary | ICD-10-CM | POA: Insufficient documentation

## 2017-08-13 DIAGNOSIS — F17210 Nicotine dependence, cigarettes, uncomplicated: Secondary | ICD-10-CM | POA: Insufficient documentation

## 2017-08-13 MED ORDER — PREDNISONE 20 MG PO TABS
60.0000 mg | ORAL_TABLET | Freq: Once | ORAL | Status: AC
  Administered 2017-08-13: 60 mg via ORAL
  Filled 2017-08-13: qty 3

## 2017-08-13 MED ORDER — PREDNISONE 50 MG PO TABS: 50.0000 mg | ORAL_TABLET | Freq: Every day | ORAL | 0 refills | Status: DC

## 2017-08-13 MED ORDER — ALBUTEROL SULFATE HFA 108 (90 BASE) MCG/ACT IN AERS
2.0000 | INHALATION_SPRAY | Freq: Four times a day (QID) | RESPIRATORY_TRACT | Status: DC | PRN
  Administered 2017-08-13: 2 via RESPIRATORY_TRACT
  Filled 2017-08-13: qty 6.7

## 2017-08-13 MED ORDER — IPRATROPIUM-ALBUTEROL 0.5-2.5 (3) MG/3ML IN SOLN
3.0000 mL | Freq: Once | RESPIRATORY_TRACT | Status: AC
  Administered 2017-08-13: 3 mL via RESPIRATORY_TRACT
  Filled 2017-08-13: qty 3

## 2017-08-13 MED ORDER — ALBUTEROL SULFATE HFA 108 (90 BASE) MCG/ACT IN AERS: 2.0000 | INHALATION_SPRAY | RESPIRATORY_TRACT | 0 refills | Status: DC | PRN

## 2017-08-13 NOTE — ED Provider Notes (Signed)
[name] Provider Note   CSN: [id number] Arrival date & time: 08/13/17  [id number]     History   Chief Complaint Chief Complaint  Patient presents with  . Asthma    HPI Devin [name] is a 37 y.o. male.  The history is provided by the patient. No language interpreter was used.  Asthma  This is a new problem. The current episode started yesterday. The problem occurs constantly. The problem has not changed since onset.Nothing aggravates the symptoms. Nothing relieves the symptoms. He has tried nothing for the symptoms. The treatment provided moderate relief.  Pt complains of swelling   Past Medical History:  Diagnosis Date  . ADHD (attention deficit hyperactivity disorder)   . Asthma   . GERD (gastroesophageal reflux disease)   . Transaminitis 07/17/2015    Patient Active Problem List   Diagnosis Date Noted  . Cocaine abuse with cocaine-induced mood disorder (HCC) 03/22/2017  . Hypoxia   . COPD exacerbation ([facility])   . Leukocytosis 11/04/2015  . Transaminitis 07/17/2015  . Tooth ache 07/17/2015  . Asthma with status asthmaticus 10/14/2011  . ADHD (attention deficit hyperactivity disorder) 10/14/2011    Past Surgical History:  Procedure Laterality Date  . TYMPANOPLASTY Left 1997        Home Medications    Prior to Admission medications   Medication Sig Start Date End Date Taking? Authorizing Provider  benzonatate (TESSALON) 100 MG capsule Take 2 capsules (200 mg total) by mouth every 8 (eight) hours. 08/05/17   Khatri, Hina, PA-C  fluticasone (FLONASE) 50 MCG/ACT nasal spray Place 1 spray into both nostrils daily. 08/05/17   Khatri, Hina, PA-C  naproxen (NAPROSYN) 500 MG tablet Take 1 tablet (500 mg total) by mouth 2 (two) times daily. 08/05/17   Khatri, Hina, PA-C  ondansetron (ZOFRAN ODT) 4 MG disintegrating tablet Take 1 tablet (4 mg total) by mouth every 8 (eight) hours as needed for nausea or vomiting. 08/05/17   Dietrich PatesKhatri, [place],  PA-C    Family History Family History  Problem Relation Age of Onset  . Coronary artery disease Father   . Heart attack Father   . Heart disease Father   . Heart failure Mother   . Hyperlipidemia Mother   . Hypertension Mother   . Migraines Mother   . COPD Mother   . Diabetes Maternal Aunt   . Asthma Maternal Grandfather   . Asthma Paternal Grandfather     Social History Social History   Tobacco Use  . Smoking status: Current Every Day Smoker    Packs/day: 0.50    Types: Cigarettes  . Smokeless tobacco: Never Used  Substance Use Topics  . Alcohol use: Yes    Alcohol/week: 0.0 oz  . Drug use: Yes    Types: Marijuana, Cocaine    Comment:                       Allergies   Augmentin [amoxicillin-pot clavulanate] and Other   Review of Systems Review of Systems  All other systems reviewed and are negative.    Physical Exam Updated Vital Signs BP 118/84 (BP Location: Right Arm)   Pulse 79   Temp 98.8 F (37.1 C) (Oral)   SpO2 100%   Physical Exam  Constitutional: He appears well-developed and well-nourished.  HENT:  Head: Normocephalic.  Eyes: Pupils are equal, round, and reactive to light.  Neck: Normal range of motion.  Cardiovascular: Normal rate.  Pulmonary/Chest: Effort normal.  He has wheezes.  Musculoskeletal: Normal range of motion.  Neurological: He is alert.  Skin: Skin is warm.  Psychiatric: He has a normal mood and affect.  Nursing note and vitals reviewed.    ED Treatments / Results  Labs (all labs ordered are listed, but only abnormal results are displayed) Labs Reviewed - No data to display  EKG None  Radiology No results found.  Procedures Procedures (including critical care time)  Medications Ordered in ED Medications  albuterol (PROVENTIL HFA;VENTOLIN HFA) 108 (90 Base) MCG/ACT inhaler 2 puff (2 puffs Inhalation Given 08/13/17 1240)  ipratropium-albuterol (DUONEB) 0.5-2.5 (3) MG/3ML nebulizer solution 3 mL (3 mLs  Nebulization Given 08/13/17 1240)  predniSONE (DELTASONE) tablet 60 mg (60 mg Oral Given 08/13/17 1240)     Initial Impression / Assessment and Plan / ED Course  I have reviewed the triage vital signs and the nursing notes.  Pertinent labs & imaging results that were available during my care of the patient were reviewed by me and considered in my medical decision making (see chart for details).     An After Visit Summary was printed and given to the patient.  Final Clinical Impressions(s) / ED Diagnoses   Final diagnoses:  Moderate asthma with exacerbation, unspecified whether persistent    ED Discharge Orders        Ordered    predniSONE (DELTASONE) 50 MG tablet  Daily     08/13/17 1305    albuterol (PROVENTIL HFA;VENTOLIN HFA) 108 (90 Base) MCG/ACT inhaler  Every 4 hours PRN     08/13/17 1305    An After Visit Summary was printed and given to the patient.    Elson Areas, [place] 08/13/17 1306    Lorre Nick, MD 08/15/17 1009

## 2017-08-13 NOTE — ED Triage Notes (Signed)
Patient her with complaint of seasonal allergies and wheezing realated to his asthma. Has been using his albuterol inhaler at home with minimal relief. States he took zyrtec as a child with success but is unable to afford it now.  Patient in no apparent distress at this time.

## 2017-09-01 ENCOUNTER — Other Ambulatory Visit: Payer: Self-pay

## 2017-09-01 ENCOUNTER — Encounter (HOSPITAL_COMMUNITY): Payer: Self-pay

## 2017-09-01 ENCOUNTER — Emergency Department (HOSPITAL_COMMUNITY): Payer: Self-pay

## 2017-09-01 ENCOUNTER — Emergency Department (HOSPITAL_COMMUNITY)
Admission: EM | Admit: 2017-09-01 | Discharge: 2017-09-01 | Disposition: A | Discharge status: Home / Self Care | Payer: Self-pay | Attending: Emergency Medicine | Admitting: Emergency Medicine

## 2017-09-01 DIAGNOSIS — J4521 Mild intermittent asthma with (acute) exacerbation: Secondary | ICD-10-CM

## 2017-09-01 DIAGNOSIS — Y929 Unspecified place or not applicable: Secondary | ICD-10-CM | POA: Insufficient documentation

## 2017-09-01 DIAGNOSIS — W010XXA Fall on same level from slipping, tripping and stumbling without subsequent striking against object, initial encounter: Secondary | ICD-10-CM | POA: Insufficient documentation

## 2017-09-01 DIAGNOSIS — Z79899 Other long term (current) drug therapy: Secondary | ICD-10-CM | POA: Insufficient documentation

## 2017-09-01 DIAGNOSIS — J4531 Mild persistent asthma with (acute) exacerbation: Secondary | ICD-10-CM | POA: Insufficient documentation

## 2017-09-01 DIAGNOSIS — S63501A Unspecified sprain of right wrist, initial encounter: Secondary | ICD-10-CM | POA: Insufficient documentation

## 2017-09-01 DIAGNOSIS — Y998 Other external cause status: Secondary | ICD-10-CM | POA: Insufficient documentation

## 2017-09-01 DIAGNOSIS — Y939 Activity, unspecified: Secondary | ICD-10-CM | POA: Insufficient documentation

## 2017-09-01 DIAGNOSIS — F17210 Nicotine dependence, cigarettes, uncomplicated: Secondary | ICD-10-CM | POA: Insufficient documentation

## 2017-09-01 MED ORDER — ALBUTEROL SULFATE (2.5 MG/3ML) 0.083% IN NEBU
5.0000 mg | INHALATION_SOLUTION | Freq: Once | RESPIRATORY_TRACT | Status: AC
  Administered 2017-09-01: 5 mg via RESPIRATORY_TRACT
  Filled 2017-09-01: qty 6

## 2017-09-01 MED ORDER — ALBUTEROL SULFATE HFA 108 (90 BASE) MCG/ACT IN AERS
1.0000 | INHALATION_SPRAY | Freq: Once | RESPIRATORY_TRACT | Status: AC
  Administered 2017-09-01: 1 via RESPIRATORY_TRACT
  Filled 2017-09-01: qty 6.7

## 2017-09-01 NOTE — ED Notes (Signed)
Pt called by NT to room with no answer will call again

## 2017-09-01 NOTE — ED Notes (Signed)
Pt stable, ambulatory, states understanding of discharge instructions 

## 2017-09-01 NOTE — ED Notes (Signed)
Ortho at bedside.

## 2017-09-01 NOTE — ED Notes (Signed)
Snack given.

## 2017-09-01 NOTE — Discharge Instructions (Addendum)
Please read attached information. If you experience any new or worsening signs or symptoms please return to the emergency room for evaluation. Please follow-up with your primary care provider or specialist as discussed. Please use medication prescribed only as directed and discontinue taking if you have any concerning signs or symptoms.   °

## 2017-09-01 NOTE — Progress Notes (Signed)
Orthopedic Tech Progress Note Patient Details:  Devin [name] Oct 06, 1980 [id number]  Ortho Devices Type of Ortho Device: Thumb velcro splint Ortho Device/Splint Location: Right Ortho Device/Splint Interventions: Application   Post Interventions Patient Tolerated: Well Instructions Provided: Adjustment of device, Care of device   Alvina Chou 09/01/2017, 7:36 PM

## 2017-09-01 NOTE — ED Triage Notes (Signed)
Pt states he injured his right wrist last night grilling out. He states he also has had worsening asthma since last night being outside. Audible wheezing noted in triage. No distress noted.
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
## 2017-09-01 NOTE — ED Provider Notes (Signed)
[facility] EMERGENCY DEPARTMENT Provider Note   CSN: [id number] Arrival date & time: 09/01/17  1302     History   Chief Complaint Chief Complaint  Patient presents with  . Wrist Injury  . Asthma    HPI Devin [name] is a 37 y.o. male.  HPI   37 year old male presents today with several complaints.  Patient reports yesterday he fell and landed on his right wrist.  He notes some minor pain over the dorsal aspect of the wrist and palmar hand, no significant dorsal tenderness, no swelling or edema, sensation intact.  Patient also reports asthma exacerbation typical of his previous, he notes he has run out of his inhaler.  Patient denies any chest pain cough or fever  Past Medical History:  Diagnosis Date  . ADHD (attention deficit hyperactivity disorder)   . Asthma   . GERD (gastroesophageal reflux disease)   . Transaminitis 07/17/2015    Patient Active Problem List   Diagnosis Date Noted  . Cocaine abuse with cocaine-induced mood disorder (HCC) 03/22/2017  . Hypoxia   . COPD exacerbation (HCC)   . Leukocytosis 11/04/2015  . Transaminitis 07/17/2015  . Tooth ache 07/17/2015  . Asthma with status asthmaticus 10/14/2011  . ADHD (attention deficit hyperactivity disorder) 10/14/2011    Past Surgical History:  Procedure Laterality Date  . TYMPANOPLASTY Left 1997        Home Medications    Prior to Admission medications   Medication Sig Start Date End Date Taking? Authorizing Provider  albuterol (PROVENTIL HFA;VENTOLIN HFA) 108 (90 Base) MCG/ACT inhaler Inhale 2 puffs into the lungs every 4 (four) hours as needed for wheezing or shortness of breath. 08/13/17   Elson Areas, PA-C  benzonatate (TESSALON) 100 MG capsule Take 2 capsules (200 mg total) by mouth every 8 (eight) hours. 08/05/17   Khatri, Hina, PA-C  fluticasone (FLONASE) 50 MCG/ACT nasal spray Place 1 spray into both nostrils daily. 08/05/17   Khatri, Hina, PA-C  naproxen (NAPROSYN) 500 MG  tablet Take 1 tablet (500 mg total) by mouth 2 (two) times daily. 08/05/17   Khatri, Hina, PA-C  ondansetron (ZOFRAN ODT) 4 MG disintegrating tablet Take 1 tablet (4 mg total) by mouth every 8 (eight) hours as needed for nausea or vomiting. 08/05/17   Khatri, Hina, PA-C  predniSONE (DELTASONE) 50 MG tablet Take 1 tablet (50 mg total) by mouth daily. 08/13/17   Elson Areas, PA-C    Family History Family History  Problem Relation Age of Onset  . Coronary artery disease Father   . Heart attack Father   . Heart disease Father   . Heart failure Mother   . Hyperlipidemia Mother   . Hypertension Mother   . Migraines Mother   . COPD Mother   . Diabetes Maternal Aunt   . Asthma Maternal Grandfather   . Asthma Paternal Grandfather     Social History Social History   Tobacco Use  . Smoking status: Current Every Day Smoker    Packs/day: 0.50    Types: Cigarettes  . Smokeless tobacco: Never Used  Substance Use Topics  . Alcohol use: Yes    Alcohol/week: 0.0 oz  . Drug use: Yes    Types: Marijuana, Cocaine    Comment:                       Allergies   Augmentin [amoxicillin-pot clavulanate] and Other   Review of Systems Review of Systems  All  other systems reviewed and are negative.  Physical Exam Updated Vital Signs BP 138/89   Pulse 85   Temp 98.1 F (36.7 C) (Oral)   Resp 16   SpO2 99%   Physical Exam  Constitutional: He is oriented to person, place, and time. He appears well-developed and well-nourished.  HENT:  Head: Normocephalic and atraumatic.  Eyes: Pupils are equal, round, and reactive to light. Conjunctivae are normal. Right eye exhibits no discharge. Left eye exhibits no discharge. No scleral icterus.  Neck: Normal range of motion. No JVD present. No tracheal deviation present.  Pulmonary/Chest: Effort normal and breath sounds normal. No stridor. No respiratory distress. He has no wheezes. He has no rales. He exhibits no tenderness.  Musculoskeletal:    Right wrist atraumatic, minor tenderness to palpation of the volar wrist and volar hand, no open wounds, no snuffbox tenderness, sensation intact  Neurological: He is alert and oriented to person, place, and time. Coordination normal.  Psychiatric: He has a normal mood and affect. His behavior is normal. Judgment and thought content normal.  Nursing note and vitals reviewed.    ED Treatments / Results  Labs (all labs ordered are listed, but only abnormal results are displayed) Labs Reviewed - No data to display  EKG EKG Interpretation  Date/Time:  Monday Sep 01 2017 14:26:29 EDT Ventricular Rate:  78 PR Interval:  152 QRS Duration: 88 QT Interval:  362 QTC Calculation: 412 R Axis:   40 Text Interpretation:  Sinus rhythm with marked sinus arrhythmia Otherwise normal ECG No significant change since last tracing Confirmed by Jacalyn Lefevre [phone]) on 09/02/2017 1:30:49 PM   Radiology Dg Wrist Complete Right  Result Date: 09/01/2017 CLINICAL DATA:  Pain following twisting type injury EXAM: RIGHT WRIST - COMPLETE 3+ VIEW COMPARISON:  Right hand Aug 26, 2008 FINDINGS: Frontal, oblique, lateral, and ulnar deviation scaphoid images were obtained. No fracture or dislocation. Joint spaces appear normal. No erosive change. There is a benign cystic area in the mid scaphoid bone. IMPRESSION: Benign cystic appearing area in the midportion of the scaphoid bone. No appreciable arthropathy. No fracture or dislocation evident. Electronically Signed   By: Bretta Bang III M.D.   On: 09/01/2017 15:06    Procedures Procedures (including critical care time)  Medications Ordered in ED Medications  albuterol (PROVENTIL) (2.5 MG/3ML) 0.083% nebulizer solution 5 mg (5 mg Nebulization Given 09/01/17 1500)  albuterol (PROVENTIL HFA;VENTOLIN HFA) 108 (90 Base) MCG/ACT inhaler 1 puff (1 puff Inhalation Given 09/01/17 1943)     Initial Impression / Assessment and Plan / ED Course  I have reviewed the  triage vital signs and the nursing notes.  Pertinent labs & imaging results that were available during my care of the patient were reviewed by me and considered in my medical decision making (see chart for details).     Patient presents with wrist sprain, placed in a thumb spica.  Encouraged to follow-up as an outpatient if symptoms persist.  Patient also with asthma exacerbation he received a breathing treatment prior to my evaluation which has resolved his symptoms.  She given inhaler discharged with strict return precautions and follow-up information.  Patient verbalized understanding and agreement to today's plan.  Final Clinical Impressions(s) / ED Diagnoses   Final diagnoses:  Mild intermittent asthma with exacerbation  Sprain of right wrist, initial encounter    ED Discharge Orders    None       Rosalio Loud 09/02/17 1413    Rolland Porter,  MD 09/08/17 1532

## 2017-09-23 ENCOUNTER — Emergency Department (HOSPITAL_COMMUNITY): Payer: Self-pay

## 2017-09-23 ENCOUNTER — Emergency Department (HOSPITAL_COMMUNITY)
Admission: EM | Admit: 2017-09-23 | Discharge: 2017-09-23 | Disposition: A | Discharge status: Home / Self Care | Payer: Self-pay | Attending: Emergency Medicine | Admitting: Emergency Medicine

## 2017-09-23 ENCOUNTER — Other Ambulatory Visit: Payer: Self-pay

## 2017-09-23 ENCOUNTER — Encounter (HOSPITAL_COMMUNITY): Payer: Self-pay | Admitting: Emergency Medicine

## 2017-09-23 DIAGNOSIS — F17210 Nicotine dependence, cigarettes, uncomplicated: Secondary | ICD-10-CM | POA: Insufficient documentation

## 2017-09-23 DIAGNOSIS — F909 Attention-deficit hyperactivity disorder, unspecified type: Secondary | ICD-10-CM | POA: Insufficient documentation

## 2017-09-23 DIAGNOSIS — Z79899 Other long term (current) drug therapy: Secondary | ICD-10-CM | POA: Insufficient documentation

## 2017-09-23 DIAGNOSIS — J4521 Mild intermittent asthma with (acute) exacerbation: Secondary | ICD-10-CM | POA: Insufficient documentation

## 2017-09-23 DIAGNOSIS — J45901 Unspecified asthma with (acute) exacerbation: Secondary | ICD-10-CM

## 2017-09-23 MED ORDER — IPRATROPIUM-ALBUTEROL 0.5-2.5 (3) MG/3ML IN SOLN
3.0000 mL | Freq: Once | RESPIRATORY_TRACT | Status: AC
  Administered 2017-09-23: 3 mL via RESPIRATORY_TRACT
  Filled 2017-09-23: qty 3

## 2017-09-23 MED ORDER — ALBUTEROL SULFATE HFA 108 (90 BASE) MCG/ACT IN AERS: 1.0000 | INHALATION_SPRAY | Freq: Four times a day (QID) | RESPIRATORY_TRACT | 0 refills | Status: DC | PRN

## 2017-09-23 MED ORDER — ALBUTEROL SULFATE (2.5 MG/3ML) 0.083% IN NEBU
5.0000 mg | INHALATION_SOLUTION | Freq: Once | RESPIRATORY_TRACT | Status: AC
  Administered 2017-09-23: 5 mg via RESPIRATORY_TRACT
  Filled 2017-09-23: qty 6

## 2017-09-23 NOTE — ED Triage Notes (Signed)
Pt states got ready for work this morning and felt very SOB. Took his breathing treatments which did not help. Pt has hx of asthma and feels that it is a flare up.

## 2017-09-23 NOTE — Discharge Instructions (Addendum)
Please read attached information. If you experience any new or worsening signs or symptoms please return to the emergency room for evaluation. Please follow-up with your primary care provider or specialist as discussed. Please use medication prescribed only as directed and discontinue taking if you have any concerning signs or symptoms.   °

## 2017-09-23 NOTE — ED Provider Notes (Signed)
[facility] EMERGENCY DEPARTMENT Provider Note   CSN: [id number] Arrival date & time: 09/23/17  0906     History   Chief Complaint Chief Complaint  Patient presents with  . Shortness of Breath    HPI Devin [name] is a 37 y.o. male.  HPI   37 year old male presents today with complaints of asthma exacerbation.  Patient notes a history of the same.  He reports last night waking up with chest tightness, wheezing.  He notes he did not have his inhaler at home.  Patient received a breathing treatment prior to my evaluation which significantly improved his symptoms.  He notes he still feels some tightness in his chest, denies any chest pain, productive cough, fever, or any other concerning signs or symptoms.  He notes this is identical to previous episodes of asthma exacerbation.  Past Medical History:  Diagnosis Date  . ADHD (attention deficit hyperactivity disorder)   . Asthma   . GERD (gastroesophageal reflux disease)   . Transaminitis 07/17/2015    Patient Active Problem List   Diagnosis Date Noted  . Cocaine abuse with cocaine-induced mood disorder (HCC) 03/22/2017  . Hypoxia   . COPD exacerbation (HCC)   . Leukocytosis 11/04/2015  . Transaminitis 07/17/2015  . Tooth ache 07/17/2015  . Asthma with status asthmaticus 10/14/2011  . ADHD (attention deficit hyperactivity disorder) 10/14/2011    Past Surgical History:  Procedure Laterality Date  . TYMPANOPLASTY Left 1997        Home Medications    Prior to Admission medications   Medication Sig Start Date End Date Taking? Authorizing Provider  ibuprofen (ADVIL,MOTRIN) 200 MG tablet Take 400 mg by mouth as needed for moderate pain.   Yes [provider]  albuterol (PROVENTIL HFA;VENTOLIN HFA) 108 (90 Base) MCG/ACT inhaler Inhale 1-2 puffs into the lungs every 6 (six) hours as needed for wheezing or shortness of breath. 09/23/17   Epifania Littrell, Tinnie Gens, PA-C  benzonatate (TESSALON) 100 MG capsule Take  2 capsules (200 mg total) by mouth every 8 (eight) hours. Patient not taking: Reported on 09/23/2017 08/05/17   [name], PA-C  fluticasone (FLONASE) 50 MCG/ACT nasal spray Place 1 spray into both nostrils daily. Patient not taking: Reported on 09/23/2017 08/05/17   [name], PA-C  naproxen (NAPROSYN) 500 MG tablet Take 1 tablet (500 mg total) by mouth 2 (two) times daily. Patient not taking: Reported on 09/23/2017 08/05/17   [name], PA-C  ondansetron (ZOFRAN ODT) 4 MG disintegrating tablet Take 1 tablet (4 mg total) by mouth every 8 (eight) hours as needed for nausea or vomiting. Patient not taking: Reported on 09/23/2017 08/05/17   [name], PA-C    Family History Family History  Problem Relation Age of Onset  . Coronary artery disease Father   . Heart attack Father   . Heart disease Father   . Heart failure Mother   . Hyperlipidemia Mother   . Hypertension Mother   . Migraines Mother   . COPD Mother   . Diabetes Maternal Aunt   . Asthma Maternal Grandfather   . Asthma Paternal Grandfather     Social History Social History   Tobacco Use  . Smoking status: Current Every Day Smoker    Packs/day: 0.50    Types: Cigarettes  . Smokeless tobacco: Never Used  Substance Use Topics  . Alcohol use: Yes    Alcohol/week: 0.0 oz  . Drug use: Yes    Types: Marijuana, Cocaine    Comment:  Allergies   Augmentin [amoxicillin-pot clavulanate] and Other   Review of Systems Review of Systems  All other systems reviewed and are negative.    Physical Exam Updated Vital Signs BP 112/80   Pulse 75   Temp 97.8 F (36.6 C) (Oral)   Resp 14   SpO2 100%   Physical Exam  Constitutional: He is oriented to person, place, and time. He appears well-developed and well-nourished.  HENT:  Head: Normocephalic and atraumatic.  Eyes: Pupils are equal, round, and reactive to light. Conjunctivae are normal. Right eye exhibits no discharge. Left eye exhibits no  discharge. No scleral icterus.  Neck: Normal range of motion. No JVD present. No tracheal deviation present.  Cardiovascular: Normal rate, regular rhythm and normal heart sounds.  Pulmonary/Chest: Effort normal. No stridor. No respiratory distress. He has wheezes. He has no rales. He exhibits no tenderness.  Minor inspiratory wheeze noted to the right lower lobe-cleared with subsequent respirations  Neurological: He is alert and oriented to person, place, and time. Coordination normal.  Psychiatric: He has a normal mood and affect. His behavior is normal. Judgment and thought content normal.  Nursing note and vitals reviewed.    ED Treatments / Results  Labs (all labs ordered are listed, but only abnormal results are displayed) Labs Reviewed - No data to display  EKG None  Radiology Dg Chest 2 View  Result Date: 09/23/2017 CLINICAL DATA:  Asthma,sob/wheezing since early am EXAM: CHEST - 2 VIEW COMPARISON:  Chest x-ray dated 06/28/2017. FINDINGS: The heart size and mediastinal contours are within normal limits. Both lungs are clear. The visualized skeletal structures are unremarkable. IMPRESSION: No active cardiopulmonary disease. No evidence of pneumonia or pulmonary edema. Electronically Signed   By: Bary RichardStan  Maynard M.D.   On: 09/23/2017 10:02    Procedures Procedures (including critical care time)  Medications Ordered in ED Medications  albuterol (PROVENTIL) (2.5 MG/3ML) 0.083% nebulizer solution 5 mg (5 mg Nebulization Given 09/23/17 0935)  ipratropium-albuterol (DUONEB) 0.5-2.5 (3) MG/3ML nebulizer solution 3 mL (3 mLs Nebulization Given 09/23/17 1255)     Initial Impression / Assessment and Plan / ED Course  I have reviewed the triage vital signs and the nursing notes.  Pertinent labs & imaging results that were available during my care of the patient were reviewed by me and considered in my medical decision making (see chart for details).      37 year old male presents today  with asthma exacerbation.  Typical of previous, no concerning signs or symptoms.  Discharged with albuterol, strict return precautions given.  He verbalized understanding and agreement to today's plan.   Final Clinical Impressions(s) / ED Diagnoses   Final diagnoses:  Mild asthma with exacerbation, unspecified whether persistent    ED Discharge Orders        Ordered    albuterol (PROVENTIL HFA;VENTOLIN HFA) 108 (90 Base) MCG/ACT inhaler  Every 6 hours PRN,   Status:  Discontinued     09/23/17 1334    albuterol (PROVENTIL HFA;VENTOLIN HFA) 108 (90 Base) MCG/ACT inhaler  Every 6 hours PRN     09/23/17 1345       Eyvonne MechanicHedges, Hayward Rylander, PA-C 09/23/17 1452    Benjiman CorePickering, Nathan, MD 09/23/17 2224

## 2017-11-18 ENCOUNTER — Encounter (HOSPITAL_COMMUNITY): Payer: Self-pay | Admitting: Emergency Medicine

## 2017-11-18 ENCOUNTER — Emergency Department (HOSPITAL_COMMUNITY)
Admission: EM | Admit: 2017-11-18 | Discharge: 2017-11-18 | Disposition: A | Discharge status: Home / Self Care | Payer: Self-pay | Attending: Emergency Medicine | Admitting: Emergency Medicine

## 2017-11-18 ENCOUNTER — Other Ambulatory Visit: Payer: Self-pay

## 2017-11-18 DIAGNOSIS — J45901 Unspecified asthma with (acute) exacerbation: Secondary | ICD-10-CM | POA: Insufficient documentation

## 2017-11-18 DIAGNOSIS — F909 Attention-deficit hyperactivity disorder, unspecified type: Secondary | ICD-10-CM | POA: Insufficient documentation

## 2017-11-18 DIAGNOSIS — Z79899 Other long term (current) drug therapy: Secondary | ICD-10-CM | POA: Insufficient documentation

## 2017-11-18 DIAGNOSIS — F17210 Nicotine dependence, cigarettes, uncomplicated: Secondary | ICD-10-CM | POA: Insufficient documentation

## 2017-11-18 MED ORDER — ALBUTEROL SULFATE HFA 108 (90 BASE) MCG/ACT IN AERS: 1.0000 | INHALATION_SPRAY | Freq: Four times a day (QID) | RESPIRATORY_TRACT | 1 refills | Status: DC | PRN

## 2017-11-18 NOTE — ED Triage Notes (Signed)
Pt. Breathing easier

## 2017-11-18 NOTE — ED Provider Notes (Signed)
[facility] EMERGENCY DEPARTMENT Provider Note   CSN: [id number] Arrival date & time: 11/18/17  1043     History   Chief Complaint Chief Complaint  Patient presents with  . Asthma  . Shortness of Breath    HPI Devin [name] is a 37 y.o. male.  HPI   37 year old male presents today Today with complaints of asthma exacerbation. He describes this as identical to previous. He is uncertain what triggered it today, notes that he does not have albuterol at home. He denies any chest pain fever. Patient with no history of PE. He was given albuterol , Atrovent, Solu-Medrol prior to my evaluation which improved his symptoms.  Past Medical History:  Diagnosis Date  . ADHD (attention deficit hyperactivity disorder)   . Asthma   . GERD (gastroesophageal reflux disease)   . Transaminitis 07/17/2015    Patient Active Problem List   Diagnosis Date Noted  . Cocaine abuse with cocaine-induced mood disorder (HCC) 03/22/2017  . Hypoxia   . COPD exacerbation (HCC)   . Leukocytosis 11/04/2015  . Transaminitis 07/17/2015  . Tooth ache 07/17/2015  . Asthma with status asthmaticus 10/14/2011  . ADHD (attention deficit hyperactivity disorder) 10/14/2011    Past Surgical History:  Procedure Laterality Date  . TYMPANOPLASTY Left 1997        Home Medications    Prior to Admission medications   Medication Sig Start Date End Date Taking? Authorizing Provider  ibuprofen (ADVIL,MOTRIN) 200 MG tablet Take 600 mg by mouth every 6 (six) hours as needed for moderate pain.    Yes [provider]  albuterol (PROVENTIL HFA;VENTOLIN HFA) 108 (90 Base) MCG/ACT inhaler Inhale 1-2 puffs into the lungs every 6 (six) hours as needed for wheezing or shortness of breath. 11/18/17   Carrol Bondar, Tinnie Gens, PA-C  albuterol (PROVENTIL HFA;VENTOLIN HFA) 108 (90 Base) MCG/ACT inhaler Inhale 1-2 puffs into the lungs every 6 (six) hours as needed for wheezing or shortness of breath. 11/18/17    Holt Woolbright, Tinnie Gens, PA-C  benzonatate (TESSALON) 100 MG capsule Take 2 capsules (200 mg total) by mouth every 8 (eight) hours. Patient not taking: Reported on 09/23/2017 08/05/17   [name], PA-C  fluticasone (FLONASE) 50 MCG/ACT nasal spray Place 1 spray into both nostrils daily. Patient not taking: Reported on 09/23/2017 08/05/17   [name], PA-C  naproxen (NAPROSYN) 500 MG tablet Take 1 tablet (500 mg total) by mouth 2 (two) times daily. Patient not taking: Reported on 09/23/2017 08/05/17   [name], PA-C  ondansetron (ZOFRAN ODT) 4 MG disintegrating tablet Take 1 tablet (4 mg total) by mouth every 8 (eight) hours as needed for nausea or vomiting. Patient not taking: Reported on 09/23/2017 08/05/17   [name], PA-C    Family History Family History  Problem Relation Age of Onset  . Coronary artery disease Father   . Heart attack Father   . Heart disease Father   . Heart failure Mother   . Hyperlipidemia Mother   . Hypertension Mother   . Migraines Mother   . COPD Mother   . Diabetes Maternal Aunt   . Asthma Maternal Grandfather   . Asthma Paternal Grandfather     Social History Social History   Tobacco Use  . Smoking status: Current Every Day Smoker    Packs/day: 0.50    Types: Cigarettes  . Smokeless tobacco: Never Used  Substance Use Topics  . Alcohol use: Yes    Alcohol/week: 0.0 oz  . Drug use: Yes  Types: Marijuana, Cocaine    Comment:                       Allergies   Augmentin [amoxicillin-pot clavulanate] and Other   Review of Systems Review of Systems  All other systems reviewed and are negative.   Physical Exam Updated Vital Signs BP 125/77 (BP Location: Left Arm)   Pulse 90   Temp 98.2 F (36.8 C) (Oral)   Resp 16   Ht 5\' 10"  (1.778 m)   Wt 88.5 kg (195 lb)   SpO2 98%   BMI 27.98 kg/m   Physical Exam  Constitutional: He is oriented to person, place, and time. He appears well-developed and well-nourished.  HENT:  Head: Normocephalic  and atraumatic.  Eyes: Pupils are equal, round, and reactive to light. Conjunctivae are normal. Right eye exhibits no discharge. Left eye exhibits no discharge. No scleral icterus.  Neck: Normal range of motion. No JVD present. No tracheal deviation present.  Cardiovascular: Normal rate, regular rhythm, normal heart sounds and intact distal pulses. Exam reveals no gallop and no friction rub.  No murmur heard. Pulmonary/Chest: Effort normal and breath sounds normal. No stridor. No respiratory distress. He has no wheezes. He has no rales. He exhibits no tenderness.  Musculoskeletal: He exhibits no edema.  Neurological: He is alert and oriented to person, place, and time. Coordination normal.  Psychiatric: He has a normal mood and affect. His behavior is normal. Judgment and thought content normal.  Nursing note and vitals reviewed.   ED Treatments / Results  Labs (all labs ordered are listed, but only abnormal results are displayed) Labs Reviewed - No data to display  EKG None  Radiology No results found.  Procedures Procedures (including critical care time)  Medications Ordered in ED Medications - No data to display   Initial Impression / Assessment and Plan / ED Course  I have reviewed the triage vital signs and the nursing notes.  Pertinent labs & imaging results that were available during my care of the patient were reviewed by me and considered in my medical decision making (see chart for details).     Labs:   Imaging:  Consults:  Therapeutics:  Discharge Meds: albuterol   Assessment/Plan:  37 year old male presents today with asthma exacerbation. No comp getting features, clear lung sounds noted on exam. Patient discharged with outpatient follow-up and return precautions.   Final Clinical Impressions(s) / ED Diagnoses   Final diagnoses:  Mild asthma with exacerbation, unspecified whether persistent    ED Discharge Orders        Ordered    albuterol  (PROVENTIL HFA;VENTOLIN HFA) 108 (90 Base) MCG/ACT inhaler  Every 6 hours PRN     11/18/17 1429    albuterol (PROVENTIL HFA;VENTOLIN HFA) 108 (90 Base) MCG/ACT inhaler  Every 6 hours PRN     11/18/17 1430       Eyvonne MechanicHedges, Bettylou Frew, PA-C 11/18/17 1452    Arby BarrettePfeiffer, Marcy, MD 11/24/17 1447

## 2017-11-18 NOTE — ED Triage Notes (Signed)
EMS stated, he has asthma and was pretty tight when we picked him up. He has had 10 of Albuterol  Atrovent.5 125mg . Solui-mederol 18 g rt. AC Pt. Is out of his inhaler.

## 2017-11-18 NOTE — Discharge Instructions (Addendum)
Please read attached information. If you experience any new or worsening signs or symptoms please return to the emergency room for evaluation. Please follow-up with your primary care provider or specialist as discussed. Please use medication prescribed only as directed and discontinue taking if you have any concerning signs or symptoms.   °

## 2017-12-30 ENCOUNTER — Emergency Department (HOSPITAL_COMMUNITY): Payer: Self-pay

## 2017-12-30 ENCOUNTER — Encounter (HOSPITAL_COMMUNITY): Payer: Self-pay | Admitting: Emergency Medicine

## 2017-12-30 ENCOUNTER — Emergency Department (HOSPITAL_COMMUNITY)
Admission: EM | Admit: 2017-12-30 | Discharge: 2017-12-30 | Disposition: A | Discharge status: Home / Self Care | Payer: Self-pay | Attending: Emergency Medicine | Admitting: Emergency Medicine

## 2017-12-30 ENCOUNTER — Other Ambulatory Visit: Payer: Self-pay

## 2017-12-30 DIAGNOSIS — J45909 Unspecified asthma, uncomplicated: Secondary | ICD-10-CM | POA: Insufficient documentation

## 2017-12-30 DIAGNOSIS — J45901 Unspecified asthma with (acute) exacerbation: Secondary | ICD-10-CM | POA: Insufficient documentation

## 2017-12-30 DIAGNOSIS — Z79899 Other long term (current) drug therapy: Secondary | ICD-10-CM | POA: Insufficient documentation

## 2017-12-30 DIAGNOSIS — F17210 Nicotine dependence, cigarettes, uncomplicated: Secondary | ICD-10-CM | POA: Insufficient documentation

## 2017-12-30 DIAGNOSIS — F909 Attention-deficit hyperactivity disorder, unspecified type: Secondary | ICD-10-CM | POA: Insufficient documentation

## 2017-12-30 MED ORDER — IPRATROPIUM-ALBUTEROL 0.5-2.5 (3) MG/3ML IN SOLN
RESPIRATORY_TRACT | Status: AC
  Administered 2017-12-30: 3 mL
  Filled 2017-12-30: qty 3

## 2017-12-30 MED ORDER — ALBUTEROL SULFATE (2.5 MG/3ML) 0.083% IN NEBU
5.0000 mg | INHALATION_SOLUTION | Freq: Once | RESPIRATORY_TRACT | Status: DC
  Filled 2017-12-30: qty 6

## 2017-12-30 MED ORDER — PREDNISONE 20 MG PO TABS
60.0000 mg | ORAL_TABLET | Freq: Once | ORAL | Status: AC
  Administered 2017-12-30: 60 mg via ORAL
  Filled 2017-12-30: qty 3

## 2017-12-30 MED ORDER — IPRATROPIUM-ALBUTEROL 0.5-2.5 (3) MG/3ML IN SOLN: 3.0000 mL | Freq: Once | RESPIRATORY_TRACT | Status: DC

## 2017-12-30 MED ORDER — ALBUTEROL SULFATE HFA 108 (90 BASE) MCG/ACT IN AERS
1.0000 | INHALATION_SPRAY | RESPIRATORY_TRACT | Status: DC
  Administered 2017-12-30: 2 via RESPIRATORY_TRACT
  Filled 2017-12-30: qty 6.7

## 2017-12-30 NOTE — ED Notes (Signed)
Pt states he came here today because he needs an inhaler

## 2017-12-30 NOTE — Discharge Instructions (Addendum)
Please read attached information. If you experience any new or worsening signs or symptoms please return to the emergency room for evaluation. Please follow-up with your primary care provider or specialist as discussed. Please use medication prescribed only as directed and discontinue taking if you have any concerning signs or symptoms.   °

## 2017-12-30 NOTE — ED Notes (Signed)
Patient verbalizes understanding of discharge instructions. Opportunity for questioning and answers were provided. Armband removed by staff, pt discharged from ED ambulatory.   

## 2017-12-30 NOTE — ED Provider Notes (Signed)
[facility] EMERGENCY DEPARTMENT Provider Note   CSN: [id number] Arrival date & time: 12/30/17  1415    History   Chief Complaint Chief Complaint  Patient presents with  . Shortness of Breath    HPI Delshon Kanhai is a 37 y.o. male.  HPI   37 year old male presents today with complaints of asthma exacerbation.  Patient notes a significant past medical history of the same, reports that the recent change in weather has triggered his asthma, he is run out of his medication at home.  He notes tightness in the chest.  He denies any fever productive cough, significant chest pain.  He received breathing treatment prior to my evaluation which improved his symptoms.  Patient denies any other concerns here today.  Past Medical History:  Diagnosis Date  . ADHD (attention deficit hyperactivity disorder)   . Asthma   . GERD (gastroesophageal reflux disease)   . Transaminitis 07/17/2015    Patient Active Problem List   Diagnosis Date Noted  . Cocaine abuse with cocaine-induced mood disorder (HCC) 03/22/2017  . Hypoxia   . COPD exacerbation (HCC)   . Leukocytosis 11/04/2015  . Transaminitis 07/17/2015  . Tooth ache 07/17/2015  . Asthma with status asthmaticus 10/14/2011  . ADHD (attention deficit hyperactivity disorder) 10/14/2011    Past Surgical History:  Procedure Laterality Date  . TYMPANOPLASTY Left 1997        Home Medications    Prior to Admission medications   Medication Sig Start Date End Date Taking? Authorizing Provider  albuterol (PROVENTIL HFA;VENTOLIN HFA) 108 (90 Base) MCG/ACT inhaler Inhale 1-2 puffs into the lungs every 6 (six) hours as needed for wheezing or shortness of breath. 11/18/17   Darcee Dekker, Tinnie Gens, PA-C  albuterol (PROVENTIL HFA;VENTOLIN HFA) 108 (90 Base) MCG/ACT inhaler Inhale 1-2 puffs into the lungs every 6 (six) hours as needed for wheezing or shortness of breath. 11/18/17   Kelso Bibby, Tinnie Gens, PA-C  benzonatate (TESSALON) 100 MG  capsule Take 2 capsules (200 mg total) by mouth every 8 (eight) hours. Patient not taking: Reported on 09/23/2017 08/05/17   [name], PA-C  fluticasone (FLONASE) 50 MCG/ACT nasal spray Place 1 spray into both nostrils daily. Patient not taking: Reported on 09/23/2017 08/05/17   [name], PA-C  ibuprofen (ADVIL,MOTRIN) 200 MG tablet Take 600 mg by mouth every 6 (six) hours as needed for moderate pain.     [provider]  naproxen (NAPROSYN) 500 MG tablet Take 1 tablet (500 mg total) by mouth 2 (two) times daily. Patient not taking: Reported on 09/23/2017 08/05/17   [name], PA-C  ondansetron (ZOFRAN ODT) 4 MG disintegrating tablet Take 1 tablet (4 mg total) by mouth every 8 (eight) hours as needed for nausea or vomiting. Patient not taking: Reported on 09/23/2017 08/05/17   [name], PA-C    Family History Family History  Problem Relation Age of Onset  . Coronary artery disease Father   . Heart attack Father   . Heart disease Father   . Heart failure Mother   . Hyperlipidemia Mother   . Hypertension Mother   . Migraines Mother   . COPD Mother   . Diabetes Maternal Aunt   . Asthma Maternal Grandfather   . Asthma Paternal Grandfather     Social History Social History   Tobacco Use  . Smoking status: Current Every Day Smoker    Packs/day: 0.50    Types: Cigarettes  . Smokeless tobacco: Never Used  Substance Use Topics  .  Alcohol use: Yes    Alcohol/week: 0.0 standard drinks  . Drug use: Yes    Types: Marijuana, Cocaine    Comment:                       Allergies   Augmentin [amoxicillin-pot clavulanate] and Other   Review of Systems Review of Systems  All other systems reviewed and are negative.    Physical Exam Updated Vital Signs BP 130/89 (BP Location: Right Arm)   Pulse 79   Temp 98.2 F (36.8 C) (Oral)   Resp 18   Ht 5\' 10"  (1.778 m)   Wt 86.2 kg   SpO2 97%   BMI 27.26 kg/m   Physical Exam  Constitutional: He is oriented to person,  place, and time. He appears well-developed and well-nourished.  HENT:  Head: Normocephalic and atraumatic.  Eyes: Pupils are equal, round, and reactive to light. Conjunctivae are normal. Right eye exhibits no discharge. Left eye exhibits no discharge. No scleral icterus.  Neck: Normal range of motion. No JVD present. No tracheal deviation present.  Pulmonary/Chest: Effort normal and breath sounds normal. No stridor. No respiratory distress. He has no wheezes. He has no rales. He exhibits no tenderness.  Neurological: He is alert and oriented to person, place, and time. Coordination normal.  Psychiatric: He has a normal mood and affect. His behavior is normal. Judgment and thought content normal.  Nursing note and vitals reviewed.    ED Treatments / Results  Labs (all labs ordered are listed, but only abnormal results are displayed) Labs Reviewed - No data to display  EKG None  Radiology Dg Chest 2 View  Result Date: 12/30/2017 CLINICAL DATA:  History of asthma with new onset of shortness of breath. Current smoker. EXAM: CHEST - 2 VIEW COMPARISON:  PA and lateral chest x-ray of September 23, 2017 FINDINGS: The lungs are well-expanded. There is no focal infiltrate. The interstitial markings are coarse though stable. The heart and pulmonary vascularity are normal. The mediastinum is normal in width. The trachea is midline. The bony thorax exhibits no acute abnormality. IMPRESSION: Chronic bronchitic-reactive airway changes, stable. There is no pneumonia nor other acute cardiopulmonary abnormality. Electronically Signed   By: David  [place] M.D.   On: 12/30/2017 15:27    Procedures Procedures (including critical care time)  Medications Ordered in ED Medications  albuterol (PROVENTIL HFA;VENTOLIN HFA) 108 (90 Base) MCG/ACT inhaler 1-2 puff (2 puffs Inhalation Given 12/30/17 1928)  ipratropium-albuterol (DUONEB) 0.5-2.5 (3) MG/3ML nebulizer solution (3 mLs  Given 12/30/17 1425)  predniSONE  (DELTASONE) tablet 60 mg (60 mg Oral Given 12/30/17 1450)     Initial Impression / Assessment and Plan / ED Course  I have reviewed the triage vital signs and the nursing notes.  Pertinent labs & imaging results that were available during my care of the patient were reviewed by me and considered in my medical decision making (see chart for details).     Labs:   Imaging: DG chest 2 view  Consults:  Therapeutics: Albuterol, prednisone  Discharge Meds: Albuterol  Assessment/Plan: Patient presents with asthma exacerbation.  At the time of evaluation he is well-appearing in no acute distress, he is clear lung sounds, near-perfect oxygen saturation.  No signs of infectious etiology on his chest x-ray.  Patient will be discharged with albuterol, encouragement to follow-up with primary care and strict return precautions.  He verbalized understanding and agreement to today's plan.    Final Clinical Impressions(s) /  ED Diagnoses   Final diagnoses:  Mild asthma with exacerbation, unspecified whether persistent    ED Discharge Orders    None       Rosalio Loud 12/30/17 2150    Tegeler, Canary Brim, MD 12/30/17 2242

## 2017-12-30 NOTE — ED Provider Notes (Signed)
Patient placed in Quick Look pathway, seen and evaluated   Chief Complaint: Shortness of breath, wheezing  HPI: Patient with history of asthma presents for evaluation of acute onset, persistent shortness of breath since waking up this morning.  Has had mild relief with albuterol inhaler given to him via EMS this morning and decided to go to work but his symptoms persisted.  He states that he will get asthma exacerbations when the weather becomes humid and when the seasons change.  He does not have a rescue inhaler.  Has not tried anything else for his symptoms.  No leg swelling, no recent travel or surgeries, hemoptysis, or prior history of DVT or PE.  He is not on testosterone hormone placement therapy.  Denies chest pain or chest tightness.  No cough, fever.  Smokes occasionally.  ROS: Positive for shortness of breath, wheezing Negative for fever, cough, chest pain, nasal congestion, sore throat, leg swelling  Physical Exam:   Gen: No distress  Neuro: Awake and Alert  Skin: Warm    Focused Exam: Diffuse expiratory wheezing.  Tachycardic.  No murmurs rubs or gallops noted.  2+ radial and DP/PT pulses bilaterally, Homans sign absent bilaterally, no lower extremity edema.   Initiation of care has begun. The patient has been counseled on the process, plan, and necessity for staying for the completion/evaluation, and the remainder of the medical screening examination    Bennye Alm 12/30/17 1428    Linwood Dibbles, MD 12/31/17 1517

## 2017-12-30 NOTE — ED Notes (Signed)
Pt in lobby. 

## 2017-12-30 NOTE — ED Triage Notes (Signed)
Sob since this am states ems did breathing tx this am but still bad

## 2017-12-30 NOTE — ED Notes (Signed)
After walking around outside, pt came up to nurse first and stated he was feeling short of breath. Vitals normal. Triage RN notified.

## 2018-01-31 ENCOUNTER — Emergency Department (HOSPITAL_COMMUNITY)
Admission: EM | Admit: 2018-01-31 | Discharge: 2018-01-31 | Disposition: A | Discharge status: Other Acute Inpatient Hospital | Payer: Self-pay | Attending: Emergency Medicine | Admitting: Emergency Medicine

## 2018-01-31 ENCOUNTER — Encounter (HOSPITAL_COMMUNITY): Payer: Self-pay | Admitting: *Deleted

## 2018-01-31 ENCOUNTER — Encounter (HOSPITAL_COMMUNITY): Payer: Self-pay

## 2018-01-31 ENCOUNTER — Other Ambulatory Visit: Payer: Self-pay

## 2018-01-31 ENCOUNTER — Emergency Department (HOSPITAL_COMMUNITY): Payer: Self-pay

## 2018-01-31 ENCOUNTER — Inpatient Hospital Stay (HOSPITAL_COMMUNITY)
Admission: AD | Admit: 2018-01-31 | Discharge: 2018-02-09 | DRG: 885 | Disposition: A | Discharge status: Home / Self Care | Payer: Federal, State, Local not specified - Other | Source: Intra-hospital | Attending: Psychiatry | Admitting: Psychiatry

## 2018-01-31 DIAGNOSIS — Y92230 Patient room in hospital as the place of occurrence of the external cause: Secondary | ICD-10-CM | POA: Diagnosis not present

## 2018-01-31 DIAGNOSIS — F332 Major depressive disorder, recurrent severe without psychotic features: Secondary | ICD-10-CM

## 2018-01-31 DIAGNOSIS — F1490 Cocaine use, unspecified, uncomplicated: Secondary | ICD-10-CM | POA: Diagnosis not present

## 2018-01-31 DIAGNOSIS — F1010 Alcohol abuse, uncomplicated: Secondary | ICD-10-CM | POA: Diagnosis present

## 2018-01-31 DIAGNOSIS — F1099 Alcohol use, unspecified with unspecified alcohol-induced disorder: Secondary | ICD-10-CM | POA: Diagnosis not present

## 2018-01-31 DIAGNOSIS — J45901 Unspecified asthma with (acute) exacerbation: Secondary | ICD-10-CM | POA: Diagnosis present

## 2018-01-31 DIAGNOSIS — Z7951 Long term (current) use of inhaled steroids: Secondary | ICD-10-CM

## 2018-01-31 DIAGNOSIS — F17210 Nicotine dependence, cigarettes, uncomplicated: Secondary | ICD-10-CM | POA: Insufficient documentation

## 2018-01-31 DIAGNOSIS — Z23 Encounter for immunization: Secondary | ICD-10-CM

## 2018-01-31 DIAGNOSIS — G4700 Insomnia, unspecified: Secondary | ICD-10-CM | POA: Diagnosis present

## 2018-01-31 DIAGNOSIS — Z915 Personal history of self-harm: Secondary | ICD-10-CM | POA: Diagnosis not present

## 2018-01-31 DIAGNOSIS — Z79899 Other long term (current) drug therapy: Secondary | ICD-10-CM

## 2018-01-31 DIAGNOSIS — F909 Attention-deficit hyperactivity disorder, unspecified type: Secondary | ICD-10-CM | POA: Diagnosis present

## 2018-01-31 DIAGNOSIS — Y905 Blood alcohol level of 100-119 mg/100 ml: Secondary | ICD-10-CM | POA: Diagnosis present

## 2018-01-31 DIAGNOSIS — Z9114 Patient's other noncompliance with medication regimen: Secondary | ICD-10-CM | POA: Diagnosis not present

## 2018-01-31 DIAGNOSIS — J449 Chronic obstructive pulmonary disease, unspecified: Secondary | ICD-10-CM | POA: Diagnosis present

## 2018-01-31 DIAGNOSIS — R45851 Suicidal ideations: Secondary | ICD-10-CM | POA: Diagnosis present

## 2018-01-31 DIAGNOSIS — J4521 Mild intermittent asthma with (acute) exacerbation: Secondary | ICD-10-CM | POA: Insufficient documentation

## 2018-01-31 DIAGNOSIS — Z825 Family history of asthma and other chronic lower respiratory diseases: Secondary | ICD-10-CM

## 2018-01-31 DIAGNOSIS — Z91120 Patient's intentional underdosing of medication regimen due to financial hardship: Secondary | ICD-10-CM | POA: Diagnosis not present

## 2018-01-31 DIAGNOSIS — F1410 Cocaine abuse, uncomplicated: Secondary | ICD-10-CM | POA: Insufficient documentation

## 2018-01-31 DIAGNOSIS — K0381 Cracked tooth: Secondary | ICD-10-CM | POA: Diagnosis present

## 2018-01-31 DIAGNOSIS — T43595A Adverse effect of other antipsychotics and neuroleptics, initial encounter: Secondary | ICD-10-CM | POA: Diagnosis not present

## 2018-01-31 DIAGNOSIS — K219 Gastro-esophageal reflux disease without esophagitis: Secondary | ICD-10-CM | POA: Diagnosis present

## 2018-01-31 DIAGNOSIS — F3181 Bipolar II disorder: Principal | ICD-10-CM | POA: Diagnosis present

## 2018-01-31 DIAGNOSIS — F515 Nightmare disorder: Secondary | ICD-10-CM | POA: Diagnosis not present

## 2018-01-31 DIAGNOSIS — F419 Anxiety disorder, unspecified: Secondary | ICD-10-CM | POA: Diagnosis not present

## 2018-01-31 LAB — CBC WITH DIFFERENTIAL/PLATELET
Abs Immature Granulocytes: 0.04 10*3/uL (ref 0.00–0.07)
BASOS PCT: 0 %
Basophils Absolute: 0 10*3/uL (ref 0.0–0.1)
EOS PCT: 4 %
Eosinophils Absolute: 0.4 10*3/uL (ref 0.0–0.5)
HCT: 43.2 % (ref 39.0–52.0)
Hemoglobin: 14.6 g/dL (ref 13.0–17.0)
Immature Granulocytes: 0 %
Lymphocytes Relative: 29 %
Lymphs Abs: 2.9 10*3/uL (ref 0.7–4.0)
MCH: 30.4 pg (ref 26.0–34.0)
MCHC: 33.8 g/dL (ref 30.0–36.0)
MCV: 89.8 fL (ref 80.0–100.0)
MONO ABS: 0.7 10*3/uL (ref 0.1–1.0)
Monocytes Relative: 7 %
Neutro Abs: 6 10*3/uL (ref 1.7–7.7)
Neutrophils Relative %: 60 %
PLATELETS: 380 10*3/uL (ref 150–400)
RBC: 4.81 MIL/uL (ref 4.22–5.81)
RDW: 12.4 % (ref 11.5–15.5)
WBC: 10.1 10*3/uL (ref 4.0–10.5)
nRBC: 0 % (ref 0.0–0.2)

## 2018-01-31 LAB — RAPID URINE DRUG SCREEN, HOSP PERFORMED
Amphetamines: NOT DETECTED
Barbiturates: NOT DETECTED
Benzodiazepines: NOT DETECTED
Cocaine: POSITIVE — AB
Opiates: NOT DETECTED
Tetrahydrocannabinol: POSITIVE — AB

## 2018-01-31 LAB — BASIC METABOLIC PANEL
Anion gap: 14 (ref 5–15)
BUN: 14 mg/dL (ref 6–20)
CALCIUM: 9.1 mg/dL (ref 8.9–10.3)
CO2: 21 mmol/L — AB (ref 22–32)
Chloride: 109 mmol/L (ref 98–111)
Creatinine, Ser: 0.85 mg/dL (ref 0.61–1.24)
GFR calc Af Amer: >60 mL/min (ref >60)
Glucose, Bld: 98 mg/dL (ref 70–99)
POTASSIUM: 3.6 mmol/L (ref 3.5–5.1)
Sodium: 144 mmol/L (ref 135–145)

## 2018-01-31 LAB — ETHANOL: ALCOHOL ETHYL (B): 117 mg/dL — AB (ref <10)

## 2018-01-31 MED ORDER — AEROCHAMBER PLUS FLO-VU MEDIUM MISC
1.0000 | Freq: Once | Status: AC
  Administered 2018-01-31: 1
  Filled 2018-01-31: qty 1

## 2018-01-31 MED ORDER — CHLORDIAZEPOXIDE HCL 25 MG PO CAPS: 25.0000 mg | ORAL_CAPSULE | Freq: Three times a day (TID) | ORAL | Status: DC

## 2018-01-31 MED ORDER — PREDNISONE 5 MG (21) PO TBPK: 10.0000 mg | ORAL_TABLET | Freq: Every evening | ORAL | Status: DC

## 2018-01-31 MED ORDER — PREDNISONE 5 MG (21) PO TBPK
5.0000 mg | ORAL_TABLET | Freq: Three times a day (TID) | ORAL | Status: AC
  Administered 2018-02-01 (×3): 5 mg via ORAL

## 2018-01-31 MED ORDER — LORAZEPAM 2 MG/ML IJ SOLN: 0.0000 mg | Freq: Four times a day (QID) | INTRAMUSCULAR | Status: DC

## 2018-01-31 MED ORDER — PREDNISONE 5 MG (21) PO TBPK
5.0000 mg | ORAL_TABLET | ORAL | Status: AC
  Administered 2018-01-31: 5 mg via ORAL
  Filled 2018-01-31: qty 21

## 2018-01-31 MED ORDER — NICOTINE 21 MG/24HR TD PT24
21.0000 mg | MEDICATED_PATCH | Freq: Every day | TRANSDERMAL | Status: DC
  Administered 2018-01-31 – 2018-02-09 (×10): 21 mg via TRANSDERMAL
  Filled 2018-01-31 (×12): qty 1

## 2018-01-31 MED ORDER — CHLORDIAZEPOXIDE HCL 25 MG PO CAPS: 25.0000 mg | ORAL_CAPSULE | Freq: Every day | ORAL | Status: DC

## 2018-01-31 MED ORDER — PREDNISONE 5 MG (21) PO TBPK
10.0000 mg | ORAL_TABLET | Freq: Every evening | ORAL | Status: AC
  Administered 2018-02-01: 10 mg via ORAL

## 2018-01-31 MED ORDER — HYDROXYZINE HCL 10 MG PO TABS
10.0000 mg | ORAL_TABLET | Freq: Two times a day (BID) | ORAL | Status: DC
  Administered 2018-01-31: 10 mg via ORAL
  Filled 2018-01-31: qty 1

## 2018-01-31 MED ORDER — HYDROXYZINE HCL 25 MG PO TABS: 25.0000 mg | ORAL_TABLET | Freq: Three times a day (TID) | ORAL | Status: DC | PRN

## 2018-01-31 MED ORDER — TRAZODONE HCL 50 MG PO TABS
50.0000 mg | ORAL_TABLET | Freq: Every evening | ORAL | Status: DC | PRN
  Filled 2018-01-31: qty 1

## 2018-01-31 MED ORDER — ALUM & MAG HYDROXIDE-SIMETH 200-200-20 MG/5ML PO SUSP: 30.0000 mL | ORAL | Status: DC | PRN

## 2018-01-31 MED ORDER — LORAZEPAM 1 MG PO TABS: 0.0000 mg | ORAL_TABLET | Freq: Two times a day (BID) | ORAL | Status: DC

## 2018-01-31 MED ORDER — CHLORDIAZEPOXIDE HCL 25 MG PO CAPS: 25.0000 mg | ORAL_CAPSULE | ORAL | Status: DC

## 2018-01-31 MED ORDER — GABAPENTIN 300 MG PO CAPS
300.0000 mg | ORAL_CAPSULE | Freq: Three times a day (TID) | ORAL | Status: DC
  Administered 2018-01-31 – 2018-02-02 (×4): 300 mg via ORAL
  Filled 2018-01-31 (×9): qty 1

## 2018-01-31 MED ORDER — ALBUTEROL SULFATE HFA 108 (90 BASE) MCG/ACT IN AERS
1.0000 | INHALATION_SPRAY | Freq: Four times a day (QID) | RESPIRATORY_TRACT | Status: DC | PRN
  Administered 2018-01-31: 2 via RESPIRATORY_TRACT

## 2018-01-31 MED ORDER — MAGNESIUM HYDROXIDE 400 MG/5ML PO SUSP: 30.0000 mL | Freq: Every day | ORAL | Status: DC | PRN

## 2018-01-31 MED ORDER — PREDNISONE 5 MG (21) PO TBPK
5.0000 mg | ORAL_TABLET | Freq: Four times a day (QID) | ORAL | Status: AC
  Administered 2018-02-01 – 2018-02-04 (×10): 5 mg via ORAL

## 2018-01-31 MED ORDER — GABAPENTIN 300 MG PO CAPS
300.0000 mg | ORAL_CAPSULE | Freq: Three times a day (TID) | ORAL | Status: DC
  Administered 2018-01-31: 300 mg via ORAL
  Filled 2018-01-31: qty 1

## 2018-01-31 MED ORDER — LORAZEPAM 1 MG PO TABS: 0.0000 mg | ORAL_TABLET | Freq: Four times a day (QID) | ORAL | Status: DC

## 2018-01-31 MED ORDER — THIAMINE HCL 100 MG/ML IJ SOLN: 100.0000 mg | Freq: Every day | INTRAMUSCULAR | Status: DC

## 2018-01-31 MED ORDER — MIRTAZAPINE 15 MG PO TABS
15.0000 mg | ORAL_TABLET | Freq: Every evening | ORAL | Status: DC | PRN
  Administered 2018-01-31 – 2018-02-01 (×3): 15 mg via ORAL
  Filled 2018-01-31 (×7): qty 1

## 2018-01-31 MED ORDER — PREDNISONE 5 MG (21) PO TBPK: 5.0000 mg | ORAL_TABLET | Freq: Four times a day (QID) | ORAL | Status: DC

## 2018-01-31 MED ORDER — PREDNISONE 5 MG (21) PO TBPK: 5.0000 mg | ORAL_TABLET | ORAL | Status: DC

## 2018-01-31 MED ORDER — PREDNISONE 5 MG (21) PO TBPK
10.0000 mg | ORAL_TABLET | Freq: Every evening | ORAL | Status: AC
  Administered 2018-01-31: 10 mg via ORAL

## 2018-01-31 MED ORDER — VITAMIN B-1 100 MG PO TABS: 100.0000 mg | ORAL_TABLET | Freq: Every day | ORAL | Status: DC

## 2018-01-31 MED ORDER — CHLORDIAZEPOXIDE HCL 25 MG PO CAPS
25.0000 mg | ORAL_CAPSULE | Freq: Once | ORAL | Status: AC
  Administered 2018-01-31: 25 mg via ORAL
  Filled 2018-01-31: qty 1

## 2018-01-31 MED ORDER — THIAMINE HCL 100 MG/ML IJ SOLN
100.0000 mg | Freq: Once | INTRAMUSCULAR | Status: AC
  Administered 2018-01-31: 100 mg via INTRAMUSCULAR
  Filled 2018-01-31: qty 2

## 2018-01-31 MED ORDER — PREDNISONE 5 MG (21) PO TBPK: 5.0000 mg | ORAL_TABLET | Freq: Three times a day (TID) | ORAL | Status: DC

## 2018-01-31 MED ORDER — ALBUTEROL SULFATE HFA 108 (90 BASE) MCG/ACT IN AERS
1.0000 | INHALATION_SPRAY | RESPIRATORY_TRACT | Status: DC | PRN
  Administered 2018-01-31 – 2018-02-05 (×10): 2 via RESPIRATORY_TRACT
  Administered 2018-02-06: 1 via RESPIRATORY_TRACT
  Administered 2018-02-06 – 2018-02-09 (×7): 2 via RESPIRATORY_TRACT

## 2018-01-31 MED ORDER — CHLORDIAZEPOXIDE HCL 25 MG PO CAPS
25.0000 mg | ORAL_CAPSULE | Freq: Four times a day (QID) | ORAL | Status: DC
  Administered 2018-01-31 (×2): 25 mg via ORAL
  Filled 2018-01-31: qty 1

## 2018-01-31 MED ORDER — ACETAMINOPHEN 325 MG PO TABS
650.0000 mg | ORAL_TABLET | Freq: Four times a day (QID) | ORAL | Status: DC | PRN
  Administered 2018-02-08 (×2): 650 mg via ORAL
  Filled 2018-01-31 (×2): qty 2

## 2018-01-31 MED ORDER — ADULT MULTIVITAMIN W/MINERALS CH
1.0000 | ORAL_TABLET | Freq: Every day | ORAL | Status: DC
  Administered 2018-01-31 – 2018-02-09 (×10): 1 via ORAL
  Filled 2018-01-31 (×14): qty 1

## 2018-01-31 MED ORDER — HYDROXYZINE HCL 25 MG PO TABS: 25.0000 mg | ORAL_TABLET | Freq: Four times a day (QID) | ORAL | Status: DC | PRN

## 2018-01-31 MED ORDER — LOPERAMIDE HCL 2 MG PO CAPS: 2.0000 mg | ORAL_CAPSULE | ORAL | Status: AC | PRN

## 2018-01-31 MED ORDER — ALBUTEROL SULFATE HFA 108 (90 BASE) MCG/ACT IN AERS: 1.0000 | INHALATION_SPRAY | Freq: Four times a day (QID) | RESPIRATORY_TRACT | Status: DC | PRN

## 2018-01-31 MED ORDER — NICOTINE 21 MG/24HR TD PT24
MEDICATED_PATCH | TRANSDERMAL | Status: AC
  Filled 2018-01-31: qty 1

## 2018-01-31 MED ORDER — VITAMIN B-1 100 MG PO TABS
100.0000 mg | ORAL_TABLET | Freq: Every day | ORAL | Status: DC
  Administered 2018-02-01 – 2018-02-09 (×9): 100 mg via ORAL
  Filled 2018-01-31 (×12): qty 1

## 2018-01-31 MED ORDER — LORAZEPAM 2 MG/ML IJ SOLN: 0.0000 mg | Freq: Two times a day (BID) | INTRAMUSCULAR | Status: DC

## 2018-01-31 MED ORDER — ONDANSETRON 4 MG PO TBDP: 4.0000 mg | ORAL_TABLET | Freq: Four times a day (QID) | ORAL | Status: AC | PRN

## 2018-01-31 MED ORDER — ALBUTEROL SULFATE HFA 108 (90 BASE) MCG/ACT IN AERS
1.0000 | INHALATION_SPRAY | Freq: Once | RESPIRATORY_TRACT | Status: AC
  Administered 2018-01-31: 1 via RESPIRATORY_TRACT
  Filled 2018-01-31: qty 6.7

## 2018-01-31 MED ORDER — PREDNISONE 5 MG (21) PO TBPK
10.0000 mg | ORAL_TABLET | Freq: Every morning | ORAL | Status: DC
  Filled 2018-01-31: qty 21

## 2018-01-31 MED ORDER — CHLORDIAZEPOXIDE HCL 25 MG PO CAPS
25.0000 mg | ORAL_CAPSULE | Freq: Four times a day (QID) | ORAL | Status: AC | PRN
  Administered 2018-02-01: 25 mg via ORAL
  Filled 2018-01-31: qty 1

## 2018-01-31 MED ORDER — HYDROXYZINE HCL 10 MG PO TABS
10.0000 mg | ORAL_TABLET | Freq: Two times a day (BID) | ORAL | Status: DC
  Administered 2018-01-31: 10 mg via ORAL
  Filled 2018-01-31 (×5): qty 1

## 2018-01-31 NOTE — Progress Notes (Signed)
Pt is 37 yo caucasian male, admitted today for alcoholism withdrawal after being called by 911 to an area grcery store, where he suffered an acute asthma attack , after ahving a " really big" argument with his girlfriend, who told him in hte grocery staore " just leave me..ibuprofen don't want any part of this". He says he met the GF 1 yr ago " when we were both in rehab" and says they have been living togethr and that he relapsed " just recently ( cocaine and alcohol) and she relapsed ( he says he doesn't know when) on heroin. He is acutely anxious during the admission process, he breaks down and cries frequently during the admission process and admission has to stop- while Probation officer calms him down and offers hope and encouragement and a listening ear. He reports his PMH is significant for MDD,  anxiety , exercise induced asthma and poly substance abuse. , as well as bipolar I. His PMH is significant for his mother suicidaing " her bipolar was really bad" nad he says he understands that he needs much support. He contracts for safety, is oriented to the unit and he is takesn to the unit.

## 2018-01-31 NOTE — Progress Notes (Signed)
Patient shared in group that he was focusing on just simply getting through the day. His goal for tomorrow is learn more about the inpatient program at [facility].

## 2018-01-31 NOTE — Progress Notes (Signed)
Rt gave pt Albuterol inhaler with chamber. Pt sates he does not need a dose at this time. Vitals stable no distress noted.

## 2018-01-31 NOTE — ED Provider Notes (Signed)
[facility]-EMERGENCY DEPT Provider Note   CSN: [id number] Arrival date & time: 01/31/18  4098     History   Chief Complaint Chief Complaint  Patient presents with  . Respiratory Distress  . asthma attack    HPI Lenorris Karger is a 37 y.o. male with a past medical history of asthma, cocaine abuse, who presents to ED for multiple complaints.  His first complaint is asthma exacerbation.  According to EMS, they were called from [employer] after patient began complaining of significant wheezing and trouble breathing.  He was given albuterol, Atrovent and Solu-Medrol.  States that it felt similar to his prior asthma exacerbations.  He ran out of his inhaler at home.  Denies any chest pain, hemoptysis, history of MI, PE. His next complaint is increased stress at home.  Patient extremely tearful during exam and was somewhat vague about details.  States that he resides at home with his wife and 4 children, his wife is currently expecting their fifth child.  However, he is upset because she states that she wants to leave their relationship.  Patient vaguely endorses suicidal ideations with no specific plan.  Denies any HI, AVH.  He does admit to occasional alcohol use described at 2-3 times a month.  His last drink was approximately 8 hours ago.  He also endorses cocaine use with last use being also 8 hours ago.  He is unsure if he has been on antidepressants or antianxiety medications in the past.  HPI  Past Medical History:  Diagnosis Date  . ADHD (attention deficit hyperactivity disorder)   . Asthma   . GERD (gastroesophageal reflux disease)   . Transaminitis 07/17/2015    Patient Active Problem List   Diagnosis Date Noted  . Major depressive disorder, recurrent, severe without psychotic features (HCC) 01/31/2018  . Cocaine abuse with cocaine-induced mood disorder (HCC) 03/22/2017  . Hypoxia   . COPD exacerbation (HCC)   . Leukocytosis 11/04/2015  . Transaminitis  07/17/2015  . Tooth ache 07/17/2015  . Asthma with status asthmaticus 10/14/2011  . ADHD (attention deficit hyperactivity disorder) 10/14/2011    Past Surgical History:  Procedure Laterality Date  . TYMPANOPLASTY Left 1997        Home Medications    Prior to Admission medications   Medication Sig Start Date End Date Taking? Authorizing Provider  albuterol (PROVENTIL HFA;VENTOLIN HFA) 108 (90 Base) MCG/ACT inhaler Inhale 1-2 puffs into the lungs every 6 (six) hours as needed for wheezing or shortness of breath. 11/18/17  Yes Hedges, Tinnie Gens, PA-C  ibuprofen (ADVIL,MOTRIN) 200 MG tablet Take 600 mg by mouth every 6 (six) hours as needed for moderate pain.    Yes [provider]  albuterol (PROVENTIL HFA;VENTOLIN HFA) 108 (90 Base) MCG/ACT inhaler Inhale 1-2 puffs into the lungs every 6 (six) hours as needed for wheezing or shortness of breath. Patient not taking: Reported on 01/31/2018 11/18/17   Hedges, Tinnie Gens, PA-C  benzonatate (TESSALON) 100 MG capsule Take 2 capsules (200 mg total) by mouth every 8 (eight) hours. Patient not taking: Reported on 09/23/2017 08/05/17   [name], PA-C  fluticasone (FLONASE) 50 MCG/ACT nasal spray Place 1 spray into both nostrils daily. Patient not taking: Reported on 09/23/2017 08/05/17   [name], PA-C  naproxen (NAPROSYN) 500 MG tablet Take 1 tablet (500 mg total) by mouth 2 (two) times daily. Patient not taking: Reported on 09/23/2017 08/05/17   [name], PA-C  ondansetron (ZOFRAN ODT) 4 MG disintegrating tablet Take  1 tablet (4 mg total) by mouth every 8 (eight) hours as needed for nausea or vomiting. Patient not taking: Reported on 09/23/2017 08/05/17   [name], PA-C    Family History Family History  Problem Relation Age of Onset  . Coronary artery disease Father   . Heart attack Father   . Heart disease Father   . Heart failure Mother   . Hyperlipidemia Mother   . Hypertension Mother   . Migraines Mother   . COPD Mother   .  Diabetes Maternal Aunt   . Asthma Maternal Grandfather   . Asthma Paternal Grandfather     Social History Social History   Tobacco Use  . Smoking status: Current Every Day Smoker    Packs/day: 0.50    Types: Cigarettes  . Smokeless tobacco: Never Used  Substance Use Topics  . Alcohol use: Yes    Alcohol/week: 0.0 standard drinks  . Drug use: Yes    Types: Marijuana, Cocaine    Comment:                       Allergies   Augmentin [amoxicillin-pot clavulanate] and Other   Review of Systems Review of Systems  Constitutional: Negative for appetite change, chills and fever.  HENT: Negative for ear pain, rhinorrhea, sneezing and sore throat.   Eyes: Negative for photophobia and visual disturbance.  Respiratory: Positive for cough, shortness of breath and wheezing. Negative for chest tightness.   Cardiovascular: Negative for chest pain and palpitations.  Gastrointestinal: Negative for abdominal pain, blood in stool, constipation, diarrhea, nausea and vomiting.  Genitourinary: Negative for dysuria, hematuria and urgency.  Musculoskeletal: Negative for myalgias.  Skin: Negative for rash.  Neurological: Negative for dizziness, weakness and light-headedness.  Psychiatric/Behavioral: Positive for dysphoric mood and suicidal ideas. Negative for hallucinations.     Physical Exam Updated Vital Signs BP 106/73   Pulse 87   Temp 97.6 F (36.4 C) (Oral)   Resp 17   Ht 6' (1.829 m)   Wt 81.6 kg   SpO2 94%   BMI 24.41 kg/m   Physical Exam  Constitutional: He appears well-developed and well-nourished. No distress.  Patient crying during entirety of exam.  Appears anxious at times, depressed at times.  HENT:  Head: Normocephalic and atraumatic.  Nose: Nose normal.  Eyes: Conjunctivae and EOM are normal. Left eye exhibits no discharge. No scleral icterus.  Neck: Normal range of motion. Neck supple.  Cardiovascular: Normal rate, regular rhythm, normal heart sounds and intact  distal pulses. Exam reveals no gallop and no friction rub.  No murmur heard. Pulmonary/Chest: Effort normal and breath sounds normal. No respiratory distress.  Abdominal: Soft. Bowel sounds are normal. He exhibits no distension. There is no tenderness. There is no guarding.  Musculoskeletal: Normal range of motion. He exhibits no edema.  Neurological: He is alert. He exhibits normal muscle tone. Coordination normal.  Skin: Skin is warm and dry. No rash noted.  Psychiatric: He exhibits a depressed mood. He expresses suicidal ideation. He expresses no homicidal ideation. He expresses no suicidal plans and no homicidal plans.  Nursing note and vitals reviewed.    ED Treatments / Results  Labs (all labs ordered are listed, but only abnormal results are displayed) Labs Reviewed  BASIC METABOLIC PANEL - Abnormal; Notable for the following components:      Result Value   CO2 21 (*)    All other components within normal limits  ETHANOL - Abnormal; Notable  for the following components:   Alcohol, Ethyl (B) 117 (*)    All other components within normal limits  RAPID URINE DRUG SCREEN, HOSP PERFORMED - Abnormal; Notable for the following components:   Cocaine POSITIVE (*)    Tetrahydrocannabinol POSITIVE (*)    All other components within normal limits  CBC WITH DIFFERENTIAL/PLATELET    EKG None  Radiology Dg Chest 2 View  Result Date: 01/31/2018 CLINICAL DATA:  Respiratory distress.  Hx asthma.  Smoker. EXAM: CHEST - 2 VIEW COMPARISON:  Chest x-ray dated 12/30/2017. FINDINGS: The heart size and mediastinal contours are within normal limits. Both lungs are clear. The visualized skeletal structures are unremarkable. IMPRESSION: No active cardiopulmonary disease. No evidence of pneumonia or pulmonary edema. Electronically Signed   By: Bary Richard M.D.   On: 01/31/2018 11:26    Procedures Procedures (including critical care time)  Medications Ordered in ED Medications  gabapentin  (NEURONTIN) capsule 300 mg (300 mg Oral Given 01/31/18 1333)  hydrOXYzine (ATARAX/VISTARIL) tablet 10 mg (10 mg Oral Given 01/31/18 1333)  predniSONE (STERAPRED UNI-PAK 21 TAB) tablet 10 mg (has no administration in time range)  predniSONE (STERAPRED UNI-PAK 21 TAB) tablet 5 mg (has no administration in time range)  predniSONE (STERAPRED UNI-PAK 21 TAB) tablet 5 mg (has no administration in time range)  predniSONE (STERAPRED UNI-PAK 21 TAB) tablet 10 mg (has no administration in time range)  predniSONE (STERAPRED UNI-PAK 21 TAB) tablet 5 mg (has no administration in time range)  predniSONE (STERAPRED UNI-PAK 21 TAB) tablet 10 mg (has no administration in time range)  predniSONE (STERAPRED UNI-PAK 21 TAB) tablet 5 mg (has no administration in time range)  albuterol (PROVENTIL HFA;VENTOLIN HFA) 108 (90 Base) MCG/ACT inhaler 1 puff (1 puff Inhalation Given 01/31/18 1037)  AEROCHAMBER PLUS FLO-VU MEDIUM MISC 1 each (1 each Other Given 01/31/18 1037)     Initial Impression / Assessment and Plan / ED Course  I have reviewed the triage vital signs and the nursing notes.  Pertinent labs & imaging results that were available during my care of the patient were reviewed by me and considered in my medical decision making (see chart for details).  Clinical Course as of Feb 01 1356  Sat Jan 31, 2018  1143 Alcohol, Ethyl (B)(!): 117 [HK]  1143 Chest x-ray is unremarkable.   [HK]  1144 On recheck, patient is breathing comfortably.  However, he is still tearful and states that he is "depressed."  As patient is so far medically cleared, with the exception of alcohol level of 117, I feel it is appropriate to consult TTS for further management.   [HK]    Clinical Course User Index [HK] [name], PA-C    37 year old male with a past medical history of asthma, cocaine abuse who presents to ED for multiple complaints. He initially presented for asthma attack.  Is a history of asthma and has had similar  exacerbations in the past.  He reports much improvement with albuterol x2 given by EMS, Atrovent and Solu-Medrol.  He was given an albuterol inhaler, which he states that he ran out of at home.  On examination here, he has clear breath sounds.  However, during the entirety of his visit, he was extremely tearful, stating that his depression was worsening, he had vague suicidal ideations because of his wife leaving him while expecting their fifth child.  He has a history of suicide attempt in the past.  He denies any HI, AVH.  He does admit  to cocaine abuse.  Medical screening lab work shows UDS positive for cocaine and THC, EtOH level of 117, BMP, CBC are unremarkable.  Chest x-ray is unremarkable.  Patient is medically cleared for TTS evaluation.  1:57 PM TTS recommends inpatient treatment for patient.  He will need to be on a prednisone Dosepak for his asthma exacerbation.  Final Clinical Impressions(s) / ED Diagnoses   Final diagnoses:  Mild intermittent asthma with exacerbation  Suicidal ideations    ED Discharge Orders    None       [name], PA-C 01/31/18 1357    Charlynne Pander, MD 01/31/18 1423

## 2018-01-31 NOTE — ED Notes (Signed)
ED TO INPATIENT HANDOFF REPORT  Name/Age/Gender Devin [name] 37 y.o. male  Code Status    Code Status Orders  (From admission, onward)         Start     Ordered   01/31/18 1358  Full code  Continuous     01/31/18 1357        Code Status History    Date Active Date Inactive Code Status Order ID Comments User Context   06/28/2017 2031 06/29/2017 1544 Full Code [id number]  Jeannett Senior, PA-C ED   03/21/2017 2325 03/22/2017 1818 Full Code [id number]  Ward, Ozella Almond, PA-C ED   11/03/2015 1737 11/06/2015 1657 Full Code [id number]  Karmen Bongo, MD Inpatient   07/17/2015 0532 07/20/2015 1700 Full Code [id number]  Etta Quill, DO ED   06/08/2014 0844 06/09/2014 1500 Full Code [id number]  Kelvin Cellar, MD Inpatient   10/14/2011 1553 10/17/2011 1821 Full Code [id number]  Garth Bigness, RN Inpatient   10/13/2011 1552 10/14/2011 1553 Full Code [id number]  Anne Ng, [place]      Home/SNF/Other Home  Chief Complaint resp distress  Level of Care/Admitting Diagnosis ED Disposition    ED Disposition Condition Comment   Transfer to Another Facility  Transfer to [facility], bed 300-1, may go now Accepting provider is Dr Mallie Darting, Attending provider is Jinny Blossom, NP       Medical History Past Medical History:  Diagnosis Date  . ADHD (attention deficit hyperactivity disorder)   . Asthma   . GERD (gastroesophageal reflux disease)   . Transaminitis 07/17/2015    Allergies Allergies  Allergen Reactions  . Augmentin [Amoxicillin-Pot Clavulanate] Hives    Tolerates Amoxicillin - Thuy 09/29/12  . Other Nausea And Vomiting    coconut    IV Location/Drains/Wounds Patient Lines/Drains/Airways Status   Active Line/Drains/Airways    Name:   Placement date:   Placement time:   Site:   Days:   Peripheral IV 01/31/18 Left Antecubital   01/31/18    1006    Antecubital   less than 1          Labs/Imaging Results for orders placed or performed during the hospital  encounter of 01/31/18 (from the past 48 hour(s))  Basic metabolic panel     Status: Abnormal   Collection Time: 01/31/18 10:35 AM  Result Value Ref Range   Sodium 144 135 - 145 mmol/L   Potassium 3.6 3.5 - 5.1 mmol/L   Chloride 109 98 - 111 mmol/L   CO2 21 (L) 22 - 32 mmol/L   Glucose, Bld 98 70 - 99 mg/dL   BUN 14 6 - 20 mg/dL   Creatinine, Ser 0.85 0.61 - 1.24 mg/dL   Calcium 9.1 8.9 - 10.3 mg/dL   GFR calc non Af Amer >60 >60 mL/min   GFR calc Af Amer >60 >60 mL/min    Comment: (NOTE) The eGFR has been calculated using the CKD EPI equation. This calculation has not been validated in all clinical situations. eGFR's persistently <60 mL/min signify possible Chronic Kidney Disease.    Anion gap 14 5 - 15    Comment: Performed at [facility], [place] [address], [place], [place] [address]  CBC with Differential     Status: None   Collection Time: 01/31/18 10:35 AM  Result Value Ref Range   WBC 10.1 4.0 - 10.5 K/uL   RBC 4.81 4.22 - 5.81 MIL/uL   Hemoglobin 14.6 13.0 - 17.0 g/dL   HCT 43.2 39.0 -  52.0 %   MCV 89.8 80.0 - 100.0 fL   MCH 30.4 26.0 - 34.0 pg   MCHC 33.8 30.0 - 36.0 g/dL   RDW 12.4 11.5 - 15.5 %   Platelets 380 150 - 400 K/uL   nRBC 0.0 0.0 - 0.2 %   Neutrophils Relative % 60 %   Neutro Abs 6.0 1.7 - 7.7 K/uL   Lymphocytes Relative 29 %   Lymphs Abs 2.9 0.7 - 4.0 K/uL   Monocytes Relative 7 %   Monocytes Absolute 0.7 0.1 - 1.0 K/uL   Eosinophils Relative 4 %   Eosinophils Absolute 0.4 0.0 - 0.5 K/uL   Basophils Relative 0 %   Basophils Absolute 0.0 0.0 - 0.1 K/uL   Immature Granulocytes 0 %   Abs Immature Granulocytes 0.04 0.00 - 0.07 K/uL    Comment: Performed at [facility], [place] [address] [place], [place] [address]  Ethanol     Status: Abnormal   Collection Time: 01/31/18 10:35 AM  Result Value Ref Range   Alcohol, Ethyl (B) 117 (H) <10 mg/dL    Comment: (NOTE) Lowest detectable limit for serum alcohol is 10  mg/dL. For medical purposes only. Performed at [facility], [place] [address], [place], [place] [address]   Urine rapid drug screen (hosp performed)     Status: Abnormal   Collection Time: 01/31/18 10:35 AM  Result Value Ref Range   Opiates NONE DETECTED NONE DETECTED   Cocaine POSITIVE (A) NONE DETECTED   Benzodiazepines NONE DETECTED NONE DETECTED   Amphetamines NONE DETECTED NONE DETECTED   Tetrahydrocannabinol POSITIVE (A) NONE DETECTED   Barbiturates NONE DETECTED NONE DETECTED    Comment: (NOTE) DRUG SCREEN FOR MEDICAL PURPOSES ONLY.  IF CONFIRMATION IS NEEDED FOR ANY PURPOSE, NOTIFY LAB WITHIN 5 DAYS. LOWEST DETECTABLE LIMITS FOR URINE DRUG SCREEN Drug Class                     Cutoff (ng/mL) Amphetamine and metabolites    1000 Barbiturate and metabolites    200 Benzodiazepine                 196 Tricyclics and metabolites     300 Opiates and metabolites        300 Cocaine and metabolites        300 THC                            50 Performed at [facility], [place] [address], [place], [place] [address]    Dg Chest 2 View  Result Date: 01/31/2018 CLINICAL DATA:  Respiratory distress.  Hx asthma.  Smoker. EXAM: CHEST - 2 VIEW COMPARISON:  Chest x-ray dated 12/30/2017. FINDINGS: The heart size and mediastinal contours are within normal limits. Both lungs are clear. The visualized skeletal structures are unremarkable. IMPRESSION: No active cardiopulmonary disease. No evidence of pneumonia or pulmonary edema. Electronically Signed   By: Franki Cabot M.D.   On: 01/31/2018 11:26    Pending Labs Unresulted Labs (From admission, onward)   None      Vitals/Pain Today's Vitals   01/31/18 1010 01/31/18 1014 01/31/18 1030 01/31/18 1133  BP: (!) 142/112  130/75 106/73  Pulse: 100  94 87  Resp: 20  (!) 28 17  Temp: 97.6 F (36.4 C)     TempSrc: Oral     SpO2: 98%  95% 94%  Weight:  81.6 kg    Height:  6' (1.829 m)    PainSc:  0-No  pain      Isolation Precautions No active isolations  Medications Medications  gabapentin (NEURONTIN) capsule 300 mg (300 mg Oral Given 01/31/18 1333)  hydrOXYzine (ATARAX/VISTARIL) tablet 10 mg (10 mg Oral Given 01/31/18 1333)  predniSONE (STERAPRED UNI-PAK 21 TAB) tablet 10 mg (has no administration in time range)  predniSONE (STERAPRED UNI-PAK 21 TAB) tablet 5 mg (has no administration in time range)  predniSONE (STERAPRED UNI-PAK 21 TAB) tablet 5 mg (has no administration in time range)  predniSONE (STERAPRED UNI-PAK 21 TAB) tablet 10 mg (has no administration in time range)  predniSONE (STERAPRED UNI-PAK 21 TAB) tablet 5 mg (has no administration in time range)  predniSONE (STERAPRED UNI-PAK 21 TAB) tablet 10 mg (has no administration in time range)  predniSONE (STERAPRED UNI-PAK 21 TAB) tablet 5 mg (has no administration in time range)  LORazepam (ATIVAN) injection 0-4 mg (has no administration in time range)    Or  LORazepam (ATIVAN) tablet 0-4 mg (has no administration in time range)  LORazepam (ATIVAN) injection 0-4 mg (has no administration in time range)    Or  LORazepam (ATIVAN) tablet 0-4 mg (has no administration in time range)  thiamine (VITAMIN B-1) tablet 100 mg (has no administration in time range)    Or  thiamine (B-1) injection 100 mg (has no administration in time range)  albuterol (PROVENTIL HFA;VENTOLIN HFA) 108 (90 Base) MCG/ACT inhaler 1 puff (1 puff Inhalation Given 01/31/18 1037)  AEROCHAMBER PLUS FLO-VU MEDIUM MISC 1 each (1 each Other Given 01/31/18 1037)    Mobility walks

## 2018-01-31 NOTE — ED Triage Notes (Signed)
Per EMS:  From [facility].  Respiratory distress.  Fire gave albuterol (5 mg), On EMS arrival wheezes in all lung fields so they gave 5 mg albuterol, 0.5 Atrovent, and 125 mg solu-medrol.  Pt hx of asthma, no inhaler.

## 2018-01-31 NOTE — ED Notes (Signed)
Pt reminded of needed urine sample. 

## 2018-01-31 NOTE — BH Assessment (Signed)
[facility] Assessment Progress Note  Case was staffed with Arville Care FNP who recommended a inpatient admission. Per [facility] AC patient has been accepted to 300-1. AC to coordinate later this date.

## 2018-01-31 NOTE — BH Assessment (Signed)
Assessment Note  Devin [name] is an 37 y.o. male that presents this date voluntary with S/I. Patient denies any H/I or AVH. Patient is observed to be very liable and tearful at times as this Clinical research associate attempts to assess. Patient reports he has been maintaining his sobriety for the last six months after a attempt at self harm which was followed by a admission to [facility] in 07/09/2017. Patient reports he relapsed last night 01/31/18 on cocaine (1 gram) and Cannabis (1 gram) after having a verbal altercation with his partner of two years who is pregnant with his third child. Patient reports he "can't live without her" and has a plan to "run into traffic." Per chart review patient attempted self harm (going to use cocaine until he died) in 03-20-2019and was hospitalized for that event followed by the above stated [facility] admission. Patient reports he was diagnosed with MDD three years ago at [facility] and has not been able to afford his medications since then reporting worsening symptoms over the last week to include feeling worthless and isolating. Patient is oriented x 4 and speaks in a low soft voice. Patient reports decreased sleep in the last week stating he has only slept "a hour or two the last few days." Per notes, patient has a  past medical history of asthma, cocaine abuse, who presents to ED for multiple complaints. Patient is reporting ongoing depression and increased stress at home. Patient extremely tearful during exam and was somewhat vague about details. States that he resides at home with his wife and 4 children, his wife is currently expecting their fifth child. However, he is upset because she states that she wants to leave their relationship. He does admit to occasional alcohol use although has been maintaining his sobriety for the last six months. Patient states he did relapse in the last 24 hours and used alcohol, cocaine and cannabis. Patient cannot contact for safety and is requesting a admission to  assist with symptom management. Case was staffed with Arville Care FNP who recommended a inpatient admission.   Diagnosis: F33.2 MDD recurrent without psychotic symptoms, severe, Polysubstance use  Past Medical History:  Past Medical History:  Diagnosis Date  . ADHD (attention deficit hyperactivity disorder)   . Asthma   . GERD (gastroesophageal reflux disease)   . Transaminitis 07/17/2015    Past Surgical History:  Procedure Laterality Date  . TYMPANOPLASTY Left 1997    Family History:  Family History  Problem Relation Age of Onset  . Coronary artery disease Father   . Heart attack Father   . Heart disease Father   . Heart failure Mother   . Hyperlipidemia Mother   . Hypertension Mother   . Migraines Mother   . COPD Mother   . Diabetes Maternal Aunt   . Asthma Maternal Grandfather   . Asthma Paternal Grandfather     Social History:  reports that he has been smoking cigarettes. He has been smoking about 0.50 packs per day. He has never used smokeless tobacco. He reports that he drinks alcohol. He reports that he has current or past drug history. Drugs: Marijuana and Cocaine.  Additional Social History:  Alcohol / Drug Use Pain Medications: None Prescriptions: Albuterol Over the Counter: See MAR History of alcohol / drug use?: Yes Negative Consequences of Use: Personal relationships Withdrawal Symptoms: (Denies) Substance #1 Name of Substance 1: Cannabis 1 - Age of First Use: 27 1 - Amount (size/oz): 1 gram 1 - Frequency: relpased last night  on 01/30/18 1 - Duration: One day 1 - Last Use / Amount: 01/30/18 1 gram  Substance #2 Name of Substance 2: Cocaine 2 - Age of First Use: 37 years of age 26 - Amount (size/oz): 1 gram last night 2 - Frequency: Yesterday  2 - Duration: Yesterday 2 - Last Use / Amount: 01/30/18 1 gram  CIWA: CIWA-Ar BP: 106/73 Pulse Rate: 87 COWS:    Allergies:  Allergies  Allergen Reactions  . Augmentin [Amoxicillin-Pot Clavulanate] Hives     Tolerates Amoxicillin - Thuy 09/29/12  . Other Nausea And Vomiting    coconut    Home Medications:  (Not in a hospital admission)  OB/GYN Status:  No LMP for male patient.  General Assessment Data [facility] of Assessment: [facility] TTS Assessment: In system Is this a Tele or Face-to-Face Assessment?: Face-to-Face Is this an Initial Assessment or a Re-assessment for this encounter?: Initial Assessment Patient Accompanied by:: N/A Language Other than English: No Living Arrangements: Event organiser) What gender do you identify as?: Male Marital status: Long term relationship Maiden name: NA Pregnancy Status: No Living Arrangements: Spouse/significant other Can pt return to current living arrangement?: Yes Admission Status: Voluntary Is patient capable of signing voluntary admission?: Yes Referral Source: Self/Family/Friend Insurance type: Self Pay  Medical Screening Exam [facility] Walk-in ONLY) Medical Exam completed: Yes  Crisis Care Plan Living Arrangements: Spouse/significant other Legal Guardian: (NA) Name of Psychiatrist: None Name of Therapist: None  Education Status Is patient currently in school?: No Is the patient employed, unemployed or receiving disability?: Unemployed  Risk to self with the past 6 months Suicidal Ideation: Yes-Currently Present Has patient been a risk to self within the past 6 months prior to admission? : No Suicidal Intent: Yes-Currently Present Has patient had any suicidal intent within the past 6 months prior to admission? : No Is patient at risk for suicide?: Yes Suicidal Plan?: Yes-Currently Present Has patient had any suicidal plan within the past 6 months prior to admission? : No Specify Current Suicidal Plan: Run into traffic Access to Means: Yes Specify Access to Suicidal Means: Run into traffic What has been your use of drugs/alcohol within the last 12 months?: Current use Previous Attempts/Gestures: Yes How many times?: 1 Other Self Harm  Risks: (Excessive SA use) Triggers for Past Attempts: Family contact Intentional Self Injurious Behavior: None Family Suicide History: No Recent stressful life event(s): Conflict (Comment)(Relationship issues) Persecutory voices/beliefs?: No Depression: Yes Depression Symptoms: Feeling worthless/self pity Substance abuse history and/or treatment for substance abuse?: Yes Suicide prevention information given to non-admitted patients: Not applicable  Risk to Others within the past 6 months Homicidal Ideation: No Does patient have any lifetime risk of violence toward others beyond the six months prior to admission? : No Thoughts of Harm to Others: No Current Homicidal Intent: No Current Homicidal Plan: No Access to Homicidal Means: No Identified Victim: NA History of harm to others?: No Assessment of Violence: None Noted Violent Behavior Description: NA Does patient have access to weapons?: No Criminal Charges Pending?: No Does patient have a court date: No Is patient on probation?: No  Psychosis Hallucinations: None noted Delusions: None noted  Mental Status Report Appearance/Hygiene: In scrubs Eye Contact: Poor Motor Activity: Freedom of movement Speech: Soft, Slow Level of Consciousness: Drowsy Mood: Depressed Affect: Appropriate to circumstance Anxiety Level: Minimal Thought Processes: Coherent, Relevant Judgement: Partial Orientation: Person, Place, Time Obsessive Compulsive Thoughts/Behaviors: None  Cognitive Functioning Concentration: Good Memory: Recent Intact, Remote Intact Is patient IDD: No Insight: Fair  Impulse Control: Poor Appetite: Poor Have you had any weight changes? : No Change Sleep: Decreased Total Hours of Sleep: 4 Vegetative Symptoms: None  ADLScreening [facility] Assessment Services) Patient's cognitive ability adequate to safely complete daily activities?: Yes Patient able to express need for assistance with ADLs?: Yes Independently performs  ADLs?: Yes (appropriate for developmental age)  Prior Inpatient Therapy Prior Inpatient Therapy: Yes Prior Therapy Dates: 2019 Prior Therapy Facilty/Provider([facility]): [facility] Reason for Treatment: MH issues  Prior Outpatient Therapy Prior Outpatient Therapy: Yes Prior Therapy Dates: 2017 Prior Therapy Facilty/Provider(s): [facility] Reason for Treatment: Med mang Does patient have an ACCT team?: No Does patient have Intensive In-House Services?  : No Does patient have [facility] services? : Yes Does patient have P4CC services?: No  ADL Screening (condition at time of admission) Patient's cognitive ability adequate to safely complete daily activities?: Yes Is the patient deaf or have difficulty hearing?: No Does the patient have difficulty seeing, even when wearing glasses/contacts?: No Does the patient have difficulty concentrating, remembering, or making decisions?: No Patient able to express need for assistance with ADLs?: Yes Does the patient have difficulty dressing or bathing?: No Independently performs ADLs?: Yes (appropriate for developmental age) Does the patient have difficulty walking or climbing stairs?: No Weakness of Legs: None Weakness of Arms/Hands: None  Home Assistive Devices/Equipment Home Assistive Devices/Equipment: None  Therapy Consults (therapy consults require a physician order) PT Evaluation Needed: No OT Evalulation Needed: No SLP Evaluation Needed: No Abuse/Neglect Assessment (Assessment to be complete while patient is alone) Physical Abuse: Denies Verbal Abuse: Denies Sexual Abuse: Denies Exploitation of patient/patient's resources: Denies Self-Neglect: Denies Values / Beliefs Cultural Requests During Hospitalization: None Spiritual Requests During Hospitalization: None Consults Spiritual Care Consult Needed: No Social Work Consult Needed: No Merchant navy officer (For Healthcare) Does Patient Have a Medical Advance Directive?: No Would patient like  information on creating a medical advance directive?: No - Patient declined          Disposition: Case was staffed with Arville Care FNP who recommended a inpatient admission.  Disposition Initial Assessment Completed for this Encounter: Yes Disposition of Patient: Admit Type of inpatient treatment program: Adult Patient refused recommended treatment: No Mode of transportation if patient is discharged?: Loreli Slot)  On Site Evaluation by:   Reviewed with Physician:    Alfredia Ferguson 01/31/2018 1:13 PM

## 2018-01-31 NOTE — Tx Team (Addendum)
Initial Treatment Plan 01/31/2018 5:49 PM [name] DPO:[id number]    PATIENT STRESSORS: Educational concerns Financial difficulties Marital or family conflict   PATIENT STRENGTHS: Ability for insight Active sense of humor Average or above average intelligence Capable of independent living Communication skills Financial means   PATIENT IDENTIFIED PROBLEMS: Polysubstance Abuse  S/p Asthma attack ( IN ED today)  PTSD            " I don't want to lose my children"  " I don't want to lose her"   DISCHARGE CRITERIA:  Ability to meet basic life and health needs Adequate post-discharge living arrangements Improved stabilization in mood, thinking, and/or behavior Medical problems require only outpatient monitoring Motivation to continue treatment in a less acute level of care  PRELIMINARY DISCHARGE PLAN: Attend aftercare/continuing care group Attend PHP/IOP Attend 12-step recovery group Outpatient therapy Participate in family therapy  PATIENT/FAMILY INVOLVEMENT: This treatment plan has been presented to and reviewed with the patient, Devin [name], and/or family member, .  The patient and family have been given the opportunity to ask questions and make suggestions.  Rich Brave, RN 01/31/2018, 5:49 PM

## 2018-01-31 NOTE — ED Notes (Signed)
Bed: WA08 Expected date: 01/31/18 Expected time: 9:59 AM Means of arrival: Ambulance Comments: Asthma attack

## 2018-02-01 DIAGNOSIS — F1490 Cocaine use, unspecified, uncomplicated: Secondary | ICD-10-CM

## 2018-02-01 DIAGNOSIS — F17210 Nicotine dependence, cigarettes, uncomplicated: Secondary | ICD-10-CM

## 2018-02-01 DIAGNOSIS — R45851 Suicidal ideations: Secondary | ICD-10-CM

## 2018-02-01 DIAGNOSIS — F332 Major depressive disorder, recurrent severe without psychotic features: Secondary | ICD-10-CM

## 2018-02-01 DIAGNOSIS — F1099 Alcohol use, unspecified with unspecified alcohol-induced disorder: Secondary | ICD-10-CM

## 2018-02-01 DIAGNOSIS — F419 Anxiety disorder, unspecified: Secondary | ICD-10-CM

## 2018-02-01 MED ORDER — PNEUMOCOCCAL VAC POLYVALENT 25 MCG/0.5ML IJ INJ
0.5000 mL | INJECTION | INTRAMUSCULAR | Status: AC
  Administered 2018-02-02: 0.5 mL via INTRAMUSCULAR

## 2018-02-01 MED ORDER — PANTOPRAZOLE SODIUM 40 MG PO TBEC
40.0000 mg | DELAYED_RELEASE_TABLET | Freq: Every day | ORAL | Status: DC
  Administered 2018-02-02 – 2018-02-09 (×8): 40 mg via ORAL
  Filled 2018-02-01 (×10): qty 1
  Filled 2018-02-01: qty 7

## 2018-02-01 MED ORDER — ALBUTEROL SULFATE (2.5 MG/3ML) 0.083% IN NEBU
2.5000 mg | INHALATION_SOLUTION | RESPIRATORY_TRACT | Status: DC | PRN
  Administered 2018-02-01 – 2018-02-08 (×4): 2.5 mg via RESPIRATORY_TRACT
  Filled 2018-02-01 (×4): qty 3

## 2018-02-01 MED ORDER — HYDROXYZINE HCL 25 MG PO TABS
25.0000 mg | ORAL_TABLET | Freq: Three times a day (TID) | ORAL | Status: DC | PRN
  Administered 2018-02-01 – 2018-02-09 (×17): 25 mg via ORAL
  Filled 2018-02-01 (×14): qty 1
  Filled 2018-02-01: qty 10
  Filled 2018-02-01 (×3): qty 1

## 2018-02-01 NOTE — Plan of Care (Signed)
  Problem: Activity: Goal: Interest or engagement in activities will improve Outcome: Progressing Note:  Devin [name] has been out and visible on the unit.  He attended evening wrap up group.

## 2018-02-01 NOTE — H&P (Signed)
Psychiatric Admission Assessment Adult  Patient Identification: Devin [name] MRN:  [id number] Date of Evaluation:  02/01/2018 Chief Complaint:  MDD Alcohol Use disorder Cocaine Use disorder Principal Diagnosis: MDD (major depressive disorder), recurrent severe, without psychosis ([place]) Diagnosis:   Patient Active Problem List   Diagnosis Date Noted  . Major depressive disorder, recurrent, severe without psychotic features ([place]) [F33.2] 01/31/2018  . MDD (major depressive disorder), recurrent severe, without psychosis ([place]) [F33.2] 01/31/2018  . Cocaine abuse with cocaine-induced mood disorder ([place]) [F14.14] 03/22/2017  . Hypoxia [R09.02]   . COPD exacerbation ([place]) [J44.1]   . Leukocytosis [D72.829] 11/04/2015  . Transaminitis [R74.0] 07/17/2015  . Tooth ache [K08.89] 07/17/2015  . Asthma with status asthmaticus [J45.902] 10/14/2011  . ADHD (attention deficit hyperactivity disorder) [F90.9] 10/14/2011   History of Present Illness:  01/31/18 [facility] Counselor Assessment: 37 y.o. male that presents this date voluntary with S/I. Patient denies any H/I or AVH. Patient is observed to be very liable and tearful at times as this Probation officer attempts to assess. Patient reports he has been maintaining his sobriety for the last six months after a attempt at self harm which was followed by a admission to [facility] in 07/05/2017. Patient reports he relapsed last night 01/31/18 on cocaine (1 gram) and Cannabis (1 gram) after having a verbal altercation with his partner of two years who is pregnant with his third child. Patient reports he "can't live without her" and has a plan to "run into traffic." Per chart review patient attempted self harm (going to use cocaine until he died) in 05-Jul-2017 and was hospitalized for that event followed by the above stated [facility] admission. Patient reports he was diagnosed with MDD three years ago at [facility] and has not been able to afford his medications since then reporting worsening  symptoms over the last week to include feeling worthless and isolating. Patient is oriented x 4 and speaks in a low soft voice. Patient reports decreased sleep in the last week stating he has only slept "a hour or two the last few days." Per notes, patient has a  past medical history of asthma, cocaine abuse, who presents to ED for multiple complaints. Patient is reporting ongoing depression and increased stress at home. Patient extremely tearful during exam and was somewhat vague about details. States that he resides at home with his wife and 4 children, his wife is currently expecting their fifth child. However, he is upset because she states that she wants to leave their relationship. He does admit to occasional alcohol use although has been maintaining his sobriety for the last six months. Patient states he did relapse in the last 24 hours and used alcohol, cocaine and cannabis. Patient cannot contact for safety and is requesting a admission to assist with symptom management.  02/01/18 [facility] MD SRA: 37 year old male with a past psychiatric history significant for polysubstance use disorder, attention deficit disorder, and depression who presented to the [facility] emergency department on 01/31/2018 with suicidal ideation.  The patient stated that he had been sober from substances following an admission to [facility] in December 2018.  He stated that he had relapsed on 01/31/2018 on cocaine and cannabis.  This was after he had a verbal altercation with his partner of 2 years who is currently pregnant.  This is very similar to a presentation to the emergency room in December.  He was quite tearful on admission.  He stated that he is unable to live without her, and had  a plan to "run into traffic".  He had been previously diagnosed with depression at [facility] 3 years ago, but had been unable to afford his medications.  He also has a significant past medical history for asthma.  He has been in the  emergency room on multiple occasions because of his asthma.  He was started on a prednisone taper in the emergency room yesterday.  He was admitted to the psychiatric hospital for evaluation and stabilization.  Associated Signs/Symptoms: Depression Symptoms:  depressed mood, anhedonia, feelings of worthlessness/guilt, hopelessness, suicidal thoughts without plan, loss of energy/fatigue, (Hypo) Manic Symptoms:  Denies Anxiety Symptoms:  Excessive Worry, Psychotic Symptoms:  Denies PTSD Symptoms: NA Total Time spent with patient: 30 minutes  Past Psychiatric History: Numerous hospitalizations and ED visits. Attempted overdose in past.   Is the patient at risk to self? Yes.    Has the patient been a risk to self in the past 6 months? Yes.    Has the patient been a risk to self within the distant past? Yes.    Is the patient a risk to others? No.  Has the patient been a risk to others in the past 6 months? No.  Has the patient been a risk to others within the distant past? No.   Prior Inpatient Therapy:   Prior Outpatient Therapy:    Alcohol Screening: 1. How often do you have a drink containing alcohol?: Never 2. How many drinks containing alcohol do you have on a typical day when you are drinking?: 7, 8, or 9 3. How often do you have six or more drinks on one occasion?: Less than monthly AUDIT-C Score: 4 4. How often during the last year have you found that you were not able to stop drinking once you had started?: Weekly 5. How often during the last year have you failed to do what was normally expected from you becasue of drinking?: Daily or almost daily 6. How often during the last year have you needed a first drink in the morning to get yourself going after a heavy drinking session?: Weekly 7. How often during the last year have you had a feeling of guilt of remorse after drinking?: Monthly 8. How often during the last year have you been unable to remember what happened the night  before because you had been drinking?: Less than monthly 9. Have you or someone else been injured as a result of your drinking?: No 10. Has a relative or friend or a doctor or another health worker been concerned about your drinking or suggested you cut down?: Yes, during the last year Alcohol Use Disorder Identification Test Final Score (AUDIT): 21 Intervention/Follow-up: Alcohol Education Substance Abuse History in the last 12 months:  Yes.   Consequences of Substance Abuse: Medical Consequences:  reviewed Legal Consequences:  reviewed Family Consequences:  reviewed Previous Psychotropic Medications: Yes  Psychological Evaluations: Yes  Past Medical History:  Past Medical History:  Diagnosis Date  . ADHD (attention deficit hyperactivity disorder)   . Asthma   . GERD (gastroesophageal reflux disease)   . Transaminitis 07/17/2015    Past Surgical History:  Procedure Laterality Date  . TYMPANOPLASTY Left 1997   Family History:  Family History  Problem Relation Age of Onset  . Coronary artery disease Father   . Heart attack Father   . Heart disease Father   . Heart failure Mother   . Hyperlipidemia Mother   . Hypertension Mother   . Migraines Mother   .  COPD Mother   . Diabetes Maternal Aunt   . Asthma Maternal Grandfather   . Asthma Paternal Grandfather    Family Psychiatric  History: Denies Tobacco Screening: Have you used any form of tobacco in the last 30 days? (Cigarettes, Smokeless Tobacco, Cigars, and/or Pipes): Yes Tobacco use, Select all that apply: 5 or more cigarettes per day Are you interested in Tobacco Cessation Medications?: Yes, will notify MD for an order Counseled patient on smoking cessation including recognizing danger situations, developing coping skills and basic information about quitting provided: Refused/Declined practical counseling Social History:  Social History   Substance and Sexual Activity  Alcohol Use Yes  . Alcohol/week: 0.0 standard  drinks     Social History   Substance and Sexual Activity  Drug Use Yes  . Types: Marijuana, Cocaine   Comment:                      Additional Social History:                           Allergies:   Allergies  Allergen Reactions  . Augmentin [Amoxicillin-Pot Clavulanate] Hives    Tolerates Amoxicillin - Thuy 09/29/12  . Other Nausea And Vomiting    coconut   Lab Results:  Results for orders placed or performed during the hospital encounter of 01/31/18 (from the past 48 hour(s))  Basic metabolic panel     Status: Abnormal   Collection Time: 01/31/18 10:35 AM  Result Value Ref Range   Sodium 144 135 - 145 mmol/L   Potassium 3.6 3.5 - 5.1 mmol/L   Chloride 109 98 - 111 mmol/L   CO2 21 (L) 22 - 32 mmol/L   Glucose, Bld 98 70 - 99 mg/dL   BUN 14 6 - 20 mg/dL   Creatinine, Ser 0.85 0.61 - 1.24 mg/dL   Calcium 9.1 8.9 - 10.3 mg/dL   GFR calc non Af Amer >60 >60 mL/min   GFR calc Af Amer >60 >60 mL/min    Comment: (NOTE) The eGFR has been calculated using the CKD EPI equation. This calculation has not been validated in all clinical situations. eGFR's persistently <60 mL/min signify possible Chronic Kidney Disease.    Anion gap 14 5 - 15    Comment: Performed at [facility], [place] [address] [place], [place] [address]  CBC with Differential     Status: None   Collection Time: 01/31/18 10:35 AM  Result Value Ref Range   WBC 10.1 4.0 - 10.5 K/uL   RBC 4.81 4.22 - 5.81 MIL/uL   Hemoglobin 14.6 13.0 - 17.0 g/dL   HCT 43.2 39.0 - 52.0 %   MCV 89.8 80.0 - 100.0 fL   MCH 30.4 26.0 - 34.0 pg   MCHC 33.8 30.0 - 36.0 g/dL   RDW 12.4 11.5 - 15.5 %   Platelets 380 150 - 400 K/uL   nRBC 0.0 0.0 - 0.2 %   Neutrophils Relative % 60 %   Neutro Abs 6.0 1.7 - 7.7 K/uL   Lymphocytes Relative 29 %   Lymphs Abs 2.9 0.7 - 4.0 K/uL   Monocytes Relative 7 %   Monocytes Absolute 0.7 0.1 - 1.0 K/uL   Eosinophils Relative 4 %   Eosinophils Absolute 0.4 0.0 -  0.5 K/uL   Basophils Relative 0 %   Basophils Absolute 0.0 0.0 - 0.1 K/uL   Immature Granulocytes 0 %  Abs Immature Granulocytes 0.04 0.00 - 0.07 K/uL    Comment: Performed at [facility], [place] [address], [place], [place] [address]  Ethanol     Status: Abnormal   Collection Time: 01/31/18 10:35 AM  Result Value Ref Range   Alcohol, Ethyl (B) 117 (H) <10 mg/dL    Comment: (NOTE) Lowest detectable limit for serum alcohol is 10 mg/dL. For medical purposes only. Performed at [facility], [place] [address], [place], [place] [address]   Urine rapid drug screen (hosp performed)     Status: Abnormal   Collection Time: 01/31/18 10:35 AM  Result Value Ref Range   Opiates NONE DETECTED NONE DETECTED   Cocaine POSITIVE (A) NONE DETECTED   Benzodiazepines NONE DETECTED NONE DETECTED   Amphetamines NONE DETECTED NONE DETECTED   Tetrahydrocannabinol POSITIVE (A) NONE DETECTED   Barbiturates NONE DETECTED NONE DETECTED    Comment: (NOTE) DRUG SCREEN FOR MEDICAL PURPOSES ONLY.  IF CONFIRMATION IS NEEDED FOR ANY PURPOSE, NOTIFY LAB WITHIN 5 DAYS. LOWEST DETECTABLE LIMITS FOR URINE DRUG SCREEN Drug Class                     Cutoff (ng/mL) Amphetamine and metabolites    1000 Barbiturate and metabolites    200 Benzodiazepine                 098 Tricyclics and metabolites     300 Opiates and metabolites        300 Cocaine and metabolites        300 THC                            50 Performed at [facility], [place] [address] [place], [place] [address]     Blood Alcohol level:  Lab Results  Component Value Date   ETH 117 (H) 01/31/2018   ETH <10 78/29/5621    Metabolic Disorder Labs:  No results found for: HGBA1C, MPG No results found for: PROLACTIN No results found for: CHOL, TRIG, HDL, CHOLHDL, VLDL, LDLCALC  Current Medications: Current Facility-Administered Medications  Medication Dose Route Frequency Provider Last Rate  Last Dose  . acetaminophen (TYLENOL) tablet 650 mg  650 mg Oral Q6H PRN Ethelene Hal, NP      . albuterol (PROVENTIL HFA;VENTOLIN HFA) 108 (90 Base) MCG/ACT inhaler 1-2 puff  1-2 puff Inhalation Q4H PRN Patriciaann Clan E, PA-C   2 puff at 02/01/18 1112  . albuterol (PROVENTIL) (2.5 MG/3ML) 0.083% nebulizer solution 2.5 mg  2.5 mg Nebulization Q4H PRN Sharma Covert, MD      . alum & mag hydroxide-simeth (MAALOX/MYLANTA) 200-200-20 MG/5ML suspension 30 mL  30 mL Oral Q4H PRN Ethelene Hal, NP      . chlordiazePOXIDE (LIBRIUM) capsule 25 mg  25 mg Oral Q6H PRN Sharma Covert, MD      . gabapentin (NEURONTIN) capsule 300 mg  300 mg Oral TID Ethelene Hal, NP   300 mg at 02/01/18 1110  . [START ON 02/04/2018] hydrOXYzine (ATARAX/VISTARIL) tablet 25 mg  25 mg Oral TID PRN Ethelene Hal, NP      . loperamide (IMODIUM) capsule 2-4 mg  2-4 mg Oral PRN Sharma Covert, MD      . magnesium hydroxide (MILK OF MAGNESIA) suspension 30 mL  30 mL Oral Daily PRN Ethelene Hal, NP      . mirtazapine (REMERON)  tablet 15 mg  15 mg Oral QHS,MR X 1 Laverle Hobby, PA-C   15 mg at 01/31/18 2244  . multivitamin with minerals tablet 1 tablet  1 tablet Oral Daily Sharma Covert, MD   1 tablet at 02/01/18 1111  . nicotine (NICODERM CQ - dosed in mg/24 hours) patch 21 mg  21 mg Transdermal Daily Sharma Covert, MD   21 mg at 02/01/18 1109  . ondansetron (ZOFRAN-ODT) disintegrating tablet 4 mg  4 mg Oral Q6H PRN Sharma Covert, MD      . predniSONE (STERAPRED UNI-PAK 21 TAB) tablet 10 mg  10 mg Oral Nightly Ethelene Hal, NP      . predniSONE (STERAPRED UNI-PAK 21 TAB) tablet 5 mg  5 mg Oral 3 x daily with food Ethelene Hal, NP      . Derrill Memo ON 02/02/2018] predniSONE (STERAPRED UNI-PAK 21 TAB) tablet 5 mg  5 mg Oral 4X daily taper Ethelene Hal, NP      . thiamine (VITAMIN B-1) tablet 100 mg  100 mg Oral Daily Sharma Covert, MD   100  mg at 02/01/18 1112   PTA Medications: Medications Prior to Admission  Medication Sig Dispense Refill Last Dose  . albuterol (PROVENTIL HFA;VENTOLIN HFA) 108 (90 Base) MCG/ACT inhaler Inhale 1-2 puffs into the lungs every 6 (six) hours as needed for wheezing or shortness of breath. 1 Inhaler 1 01/31/2018 at Unknown time  . albuterol (PROVENTIL HFA;VENTOLIN HFA) 108 (90 Base) MCG/ACT inhaler Inhale 1-2 puffs into the lungs every 6 (six) hours as needed for wheezing or shortness of breath. 1 Inhaler 1 01/31/2018 at Unknown time  . benzonatate (TESSALON) 100 MG capsule Take 2 capsules (200 mg total) by mouth every 8 (eight) hours. 30 capsule 0 01/31/2018 at Unknown time  . fluticasone (FLONASE) 50 MCG/ACT nasal spray Place 1 spray into both nostrils daily. 16 g 2 01/31/2018 at Unknown time  . ibuprofen (ADVIL,MOTRIN) 200 MG tablet Take 600 mg by mouth every 6 (six) hours as needed for moderate pain.    01/31/2018 at Unknown time  . naproxen (NAPROSYN) 500 MG tablet Take 1 tablet (500 mg total) by mouth 2 (two) times daily. 30 tablet 0 01/31/2018 at Unknown time  . ondansetron (ZOFRAN ODT) 4 MG disintegrating tablet Take 1 tablet (4 mg total) by mouth every 8 (eight) hours as needed for nausea or vomiting. 3 tablet 0 01/31/2018 at Unknown time    Musculoskeletal: Strength & Muscle Tone: within normal limits Gait & Station: normal Patient leans: N/A  Psychiatric Specialty Exam: Physical Exam  Nursing note and vitals reviewed. Constitutional: He is oriented to person, place, and time. He appears well-developed and well-nourished.  Cardiovascular: Normal rate.  Respiratory: Effort normal.  Musculoskeletal: Normal range of motion.  Neurological: He is alert and oriented to person, place, and time.  Skin: Skin is warm.    Review of Systems  Constitutional: Negative.   HENT: Negative.   Eyes: Negative.   Respiratory: Negative.   Cardiovascular: Negative.   Gastrointestinal: Negative.    Genitourinary: Negative.   Musculoskeletal: Negative.   Skin: Negative.   Neurological: Negative.   Endo/Heme/Allergies: Negative.   Psychiatric/Behavioral: Positive for depression, substance abuse and suicidal ideas. The patient is nervous/anxious.     Blood pressure 131/87, pulse 69, temperature 98.6 F (37 C), temperature source Oral, resp. rate 18, height 6' (1.829 m), weight 83.9 kg, SpO2 99 %.Body mass index is 25.09 kg/m.  General Appearance:  Disheveled  Eye Contact:  Poor  Speech:  Normal Rate  Volume:  Decreased  Mood:  Anxious and Depressed  Affect:  Congruent  Thought Process:  Coherent and Descriptions of Associations: Intact  Orientation:  Full (Time, Place, and Person)  Thought Content:  Logical  Suicidal Thoughts:  Yes.  without intent/plan  Homicidal Thoughts:  No  Memory:  Immediate;   Fair Recent;   Fair Remote;   Fair  Judgement:  Impaired  Insight:  Lacking  Psychomotor Activity:  Increased  Concentration:  Concentration: Fair and Attention Span: Fair  Recall:  [employer] of Knowledge:  Fair  Language:  Good  Akathisia:  NA  Handed:  Right  AIMS (if indicated):     Assets:  Desire for Improvement Physical Health Resilience  ADL's:  Intact  Cognition:  WNL  Sleep:  Number of Hours: 5.75    Treatment Plan Summary: Daily contact with patient to assess and evaluate symptoms and progress in treatment, Medication management and Plan is to:  Encourage group therapy participation See [facility] and SRA for medication management  Observation Level/Precautions:  15 minute checks  Laboratory:  Reviewed  Psychotherapy:    Medications:    Consultations:    Discharge Concerns:    Estimated LOS:  Other:     Physician Treatment Plan for Primary Diagnosis: MDD (major depressive disorder), recurrent severe, without psychosis ([place]) Long Term Goal(s): Improvement in symptoms so as ready for discharge  Short Term Goals: Ability to identify changes in lifestyle to  reduce recurrence of condition will improve, Ability to verbalize feelings will improve, Ability to disclose and discuss suicidal ideas and Ability to demonstrate self-control will improve  Physician Treatment Plan for Secondary Diagnosis: Principal Problem:   MDD (major depressive disorder), recurrent severe, without psychosis ([place])  Long Term Goal(s): Improvement in symptoms so as ready for discharge  Short Term Goals: Ability to identify and develop effective coping behaviors will improve, Ability to maintain clinical measurements within normal limits will improve, Compliance with prescribed medications will improve and Ability to identify triggers associated with substance abuse/mental health issues will improve  I certify that inpatient services furnished can reasonably be expected to improve the patient's condition.    Lewis Shock, FNP 10/13/20192:19 PM
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
## 2018-02-01 NOTE — Progress Notes (Signed)
D:  Devin [name] was noted sitting in the day room all evening.  He complained of significant anxiety and shortness of breath.  His O2 sats were 99% on room air.  He stated he believes the shortness of breath is actually anxiety.  Abuteral inhaler given with good relief.  He attended evening wrap up group and reported that his peers have been very welcoming.  He denied any pain or discomfort and appears to be in no physical distress.  He took his scheduled hs medications without difficulty.  He is currently resting with his eyes closed and appears to be asleep. A:  1:1 with RN for support and encouragement.  Medications as ordered.  Q 15 minute checks maintained for safety.  Encouraged participation in group and unit activities.   R:  Devin [name] remains safe on the unit.  We will continue to monitor the progress towards his goals.

## 2018-02-01 NOTE — [facility] Group Notes (Signed)
[facility] Group Notes:  (Nursing/MHT/Case Management/Adjunct)  Date:  02/01/2018  Time:  6:19 PM  Type of Therapy:  Nurse Education  Participation Level:  Active  Participation Quality:  Appropriate and Attentive  Affect:  Appropriate  Cognitive:  Alert and Appropriate  Insight:  Good  Engagement in Group:  Engaged  Modes of Intervention:  Discussion and Education  Summary of Progress/Problems: In this group, we identified unhealthy behaviors and their root causes. We discussed unhealthy "stinkin' thinkin'" and its impact on our emotional state and self esteem. We considered ways to boost self-esteem, such as positive self-talk.  Kirstie Mirza 02/01/2018, 6:19 PM

## 2018-02-01 NOTE — Plan of Care (Signed)
  Problem: Education: Goal: Emotional status will improve Outcome: Progressing   

## 2018-02-01 NOTE — [facility] Group Notes (Signed)
[facility] LCSW Group Therapy Note  Date/Time:  02/01/2018 9:00-10:00 or 10:00-11:00AM  Type of Therapy and Topic:  Group Therapy:  Healthy and Unhealthy Supports  Participation Level:  Did Not Attend   Description of Group:  Patients in this group were introduced to the idea of adding a variety of healthy supports to address the various needs in their lives.Patients discussed what additional healthy supports could be helpful in their recovery and wellness after discharge in order to prevent future hospitalizations.   An emphasis was placed on using counselor, doctor, therapy groups, 12-step groups, and problem-specific support groups to expand supports.  They also worked as a group on developing a specific plan for several patients to deal with unhealthy supports through boundary-setting, psychoeducation with loved ones, and even termination of relationships.   Therapeutic Goals:   1)  discuss importance of adding supports to stay well once out of the hospital  2)  compare healthy versus unhealthy supports and identify some examples of each  3)  generate ideas and descriptions of healthy supports that can be added  4)  offer mutual support about how to address unhealthy supports  5)  encourage active participation in and adherence to discharge plan    Summary of Patient Progress:   Did not attend  Therapeutic Modalities:   Motivational Interviewing Brief Solution-Focused Therapy  Louvenia Golomb D Anya Murphey         

## 2018-02-01 NOTE — [facility] Suicide Risk Assessment (Signed)
[facility] Admission Suicide Risk Assessment   Nursing information obtained from:  Patient Demographic factors:  Male, Caucasian, Low socioeconomic status Current Mental Status:  Suicidal ideation indicated by patient Loss Factors:  Decrease in vocational status, Loss of significant relationship, Financial problems / change in socioeconomic status Historical Factors:  Family history of suicide Risk Reduction Factors:  Responsible for children under 33 years of age, Sense of responsibility to family, Living with another person, especially a relative  Total Time spent with patient: 30 minutes Principal Problem: <principal problem not specified> Diagnosis:   Patient Active Problem List   Diagnosis Date Noted  . Major depressive disorder, recurrent, severe without psychotic features (HCC) [F33.2] 01/31/2018  . MDD (major depressive disorder), recurrent severe, without psychosis (HCC) [F33.2] 01/31/2018  . Cocaine abuse with cocaine-induced mood disorder (HCC) [F14.14] 03/22/2017  . Hypoxia [R09.02]   . COPD exacerbation (HCC) [J44.1]   . Leukocytosis [D72.829] 11/04/2015  . Transaminitis [R74.0] 07/17/2015  . Tooth ache [K08.89] 07/17/2015  . Asthma with status asthmaticus [J45.902] 10/14/2011  . ADHD (attention deficit hyperactivity disorder) [F90.9] 10/14/2011   Subjective Data: Patient is seen and examined.  Patient is a 37 year old male with a past psychiatric history significant for polysubstance use disorder, attention deficit disorder, and depression who presented to the [facility] emergency department on 01/31/2018 with suicidal ideation.  The patient stated that he had been sober from substances following an admission to [facility] in December 2018.  He stated that he had relapsed on 01/31/2018 on cocaine and cannabis.  This was after he had a verbal altercation with his partner of 2 years who is currently pregnant.  This is very similar to a presentation to the emergency room  in December.  He was quite tearful on admission.  He stated that he is unable to live without her, and had a plan to "run into traffic".  He had been previously diagnosed with depression at [facility] 3 years ago, but had been unable to afford his medications.  He also has a significant past medical history for asthma.  He has been in the emergency room on multiple occasions because of his asthma.  He was started on a prednisone taper in the emergency room yesterday.  He was admitted to the psychiatric hospital for evaluation and stabilization.  Continued Clinical Symptoms:  Alcohol Use Disorder Identification Test Final Score (AUDIT): 21 The "Alcohol Use Disorders Identification Test", Guidelines for Use in Primary Care, Second Edition.  World Science writer [facility]). Score between 0-7:  no or low risk or alcohol related problems. Score between 8-15:  moderate risk of alcohol related problems. Score between 16-19:  high risk of alcohol related problems. Score 20 or above:  warrants further diagnostic evaluation for alcohol dependence and treatment.   CLINICAL FACTORS:   Depression:   Anhedonia Comorbid alcohol abuse/dependence Hopelessness Impulsivity Insomnia Alcohol/Substance Abuse/Dependencies   Musculoskeletal: Strength & Muscle Tone: within normal limits Gait & Station: normal Patient leans: N/A  Psychiatric Specialty Exam: Physical Exam  Nursing note and vitals reviewed. Constitutional: He is oriented to person, place, and time. He appears well-developed and well-nourished.  HENT:  Head: Normocephalic and atraumatic.  Respiratory: Effort normal. He has wheezes.  Neurological: He is alert and oriented to person, place, and time.    ROS  Blood pressure (!) 132/92, pulse 96, temperature 98.4 F (36.9 C), temperature source Oral, resp. rate 18, height 6' (1.829 m), weight 83.9 kg, SpO2 99 %.Body mass index is 25.09 kg/m.  General Appearance: Disheveled  Eye Contact:  Poor   Speech:  Normal Rate  Volume:  Decreased  Mood:  Anxious, Depressed and Dysphoric  Affect:  Congruent  Thought Process:  Coherent and Descriptions of Associations: Intact  Orientation:  Full (Time, Place, and Person)  Thought Content:  Logical  Suicidal Thoughts:  Yes.  without intent/plan  Homicidal Thoughts:  No  Memory:  Immediate;   Fair Recent;   Fair Remote;   Fair  Judgement:  Impaired  Insight:  Lacking  Psychomotor Activity:  Increased  Concentration:  Concentration: Fair and Attention Span: Fair  Recall:  [employer] of Knowledge:  Fair  Language:  Good  Akathisia:  Negative  Handed:  Right  AIMS (if indicated):     Assets:  Desire for Improvement Physical Health Resilience  ADL's:  Intact  Cognition:  WNL  Sleep:  Number of Hours: 5.75      COGNITIVE FEATURES THAT CONTRIBUTE TO RISK:  None    SUICIDE RISK:   Moderate:  Frequent suicidal ideation with limited intensity, and duration, some specificity in terms of plans, no associated intent, good self-control, limited dysphoria/symptomatology, some risk factors present, and identifiable protective factors, including available and accessible social support.  PLAN OF CARE: Patient is seen and examined.  Patient is a 37 year old male with the above-stated past psychiatric history who presented with worsening depressive symptoms, suicidal ideation and relapse on substances.  He was positive for cocaine and marijuana.  His blood alcohol was 117.  He has a history of a transaminitis, but liver function enzymes were not in the chart.  This will be done this morning.  He also has significant asthma, and has some wheezing in his right lung field.  The left lower lung field is essentially clear.  He will continue on the prednisone.  We discussed the side effects of prednisone including insomnia, agitation, anxiety.  He understands that has been on prednisone before.  He was started on Remeron last night for sleep and depression,  and he tolerated this well.  This will be continued.  He will be integrated into the milieu.  He will be monitored for withdrawal symptoms as well as 15-minute checks for safety.  He is already on an albuterol inhaler, and I am going to add nebulization treatments as needed every 4 hours.  He will also be placed on an alcohol withdrawal protocol with Librium 25 mg p.o. every 6 hours as needed CIWA>10.  Social work will discuss with him possible treatment options after hospitalization.  He will also be started on B12 and folate.  He will also have his liver function enzymes done this a.m. I certify that inpatient services furnished can reasonably be expected to improve the patient's condition.   Antonieta Pert, MD 02/01/2018, 9:30 AM

## 2018-02-01 NOTE — Progress Notes (Signed)
D Patient is observed OOB UAL on the 300 hall today he tolerated this fairly well. HE endorses an anxious, tearful affect , he is seen crying , sobbing  And talking emotionally on the phone several times throughout the day.      A HE completed his daily assessment and on this he wrote he denied SI today, he rated his depression, hopelessness and anxeity " 11/27/08", respectively . HE has remained totally fixated on wether he will be able to maintain his relationship with his GF. He says he can't" think about anything else right now...until I know what's going to happen with her"...     R Safety is in place and poc cont.

## 2018-02-01 NOTE — Progress Notes (Signed)
Patient did attend the evening speaker AA meeting.  

## 2018-02-02 LAB — HEPATIC FUNCTION PANEL
ALK PHOS: 55 U/L (ref 38–126)
ALT: 27 U/L (ref 0–44)
AST: 23 U/L (ref 15–41)
Albumin: 3.7 g/dL (ref 3.5–5.0)
BILIRUBIN DIRECT: 0.1 mg/dL (ref 0.0–0.2)
BILIRUBIN TOTAL: 0.3 mg/dL (ref 0.3–1.2)
Indirect Bilirubin: 0.2 mg/dL — ABNORMAL LOW (ref 0.3–0.9)
Total Protein: 7 g/dL (ref 6.5–8.1)

## 2018-02-02 MED ORDER — GABAPENTIN 400 MG PO CAPS
400.0000 mg | ORAL_CAPSULE | Freq: Three times a day (TID) | ORAL | Status: DC
  Administered 2018-02-02 – 2018-02-09 (×22): 400 mg via ORAL
  Filled 2018-02-02 (×4): qty 1
  Filled 2018-02-02: qty 21
  Filled 2018-02-02 (×2): qty 1
  Filled 2018-02-02: qty 21
  Filled 2018-02-02 (×18): qty 1
  Filled 2018-02-02: qty 21
  Filled 2018-02-02 (×2): qty 1

## 2018-02-02 MED ORDER — FLUTICASONE PROPIONATE HFA 44 MCG/ACT IN AERO
2.0000 | INHALATION_SPRAY | Freq: Two times a day (BID) | RESPIRATORY_TRACT | Status: DC
  Administered 2018-02-02 – 2018-02-09 (×13): 2 via RESPIRATORY_TRACT
  Filled 2018-02-02 (×2): qty 10.6

## 2018-02-02 MED ORDER — MIRTAZAPINE 30 MG PO TABS
30.0000 mg | ORAL_TABLET | Freq: Every day | ORAL | Status: DC
  Administered 2018-02-02 – 2018-02-08 (×7): 30 mg via ORAL
  Filled 2018-02-02: qty 7
  Filled 2018-02-02 (×9): qty 1

## 2018-02-02 MED ORDER — ARIPIPRAZOLE 5 MG PO TABS
5.0000 mg | ORAL_TABLET | Freq: Every day | ORAL | Status: DC
  Administered 2018-02-02 – 2018-02-04 (×3): 5 mg via ORAL
  Filled 2018-02-02 (×5): qty 1

## 2018-02-02 MED ORDER — MIRTAZAPINE 30 MG PO TABS: 30.0000 mg | ORAL_TABLET | Freq: Every evening | ORAL | Status: DC | PRN

## 2018-02-02 NOTE — [facility] Group Notes (Addendum)
Pt attended spiritual care group on grief and loss facilitated by chaplain Burnis Kingfisher   Group goal of establishing open and affirming space for members to share loss and experience with grief, normalize grief experience and provide psycho social education and grief support.  Group opened with brief discussion and psycho-social ed around grief and loss in relationships and in relation to self - identifying life patterns, circumstances, changes that cause losses.  Group engaged in facilitated dialog and support.   Devin [name] was present throughout group.  Stated this is one of the the first groups he has been able to attend since arrival at [facility].  Received affirmation from group.   As this group was all men, they spoke about gender expectations and dynamics they experienced around expressing and finding care for grief and loss.  Described isolating and numbing pain, as they operated within the narrative that they are not supposed to feel.   Devin [name] expressed that he had numbed grief for 14 years through his substance use.  Described grief around the death of his father. Had reconnected with his biological father two years before his death.  He received normalization and affirmation from group. Group spoke about difficulty in finding safe spaces to find grief support.  Looked at four tasks of grief and spoke about resources outside hospital for grief support. Members of group wished to pray, which Devin [name] wished to join.

## 2018-02-02 NOTE — Progress Notes (Signed)
[facility] MD Progress Note  02/02/2018 12:51 PM Devin [name]  MRN:  [id number] Subjective:  Patient is seen and examined.  Patient is a 37 year old male with a past psychiatric history significant for polysubstance use disorder, attention deficit disorder, and depression who presented to the [facility] emergency department on 01/31/2018 with suicidal ideation.  He was admitted on 02/01/2018.  Objective: Patient is seen and examined.  He stated he is essentially unchanged.  He stated that he still remains depressed, agitated and anxious.  He continues on the steroids for his asthma exacerbation.  On physical examination today his lungs sound clear.  We discussed placing back a Symbicort equivalent.  He otherwise continues on albuterol nebulizations or inhalers.  We discussed increasing his Remeron dosage.  He also continues on a Librium withdrawal protocol.  His vital signs are stable, he is afebrile.  He slept 5.25 hours last night.  He remains on gabapentin, mirtazapine, prednisone, Zofran, multivitamin, and folic acid as well as thiamine. Principal Problem: MDD (major depressive disorder), recurrent severe, without psychosis (HCC) Diagnosis:   Patient Active Problem List   Diagnosis Date Noted  . Major depressive disorder, recurrent, severe without psychotic features (HCC) [F33.2] 01/31/2018  . MDD (major depressive disorder), recurrent severe, without psychosis (HCC) [F33.2] 01/31/2018  . Cocaine abuse with cocaine-induced mood disorder (HCC) [F14.14] 03/22/2017  . Hypoxia [R09.02]   . COPD exacerbation (HCC) [J44.1]   . Leukocytosis [D72.829] 11/04/2015  . Transaminitis [R74.0] 07/17/2015  . Tooth ache [K08.89] 07/17/2015  . Asthma with status asthmaticus [J45.902] 10/14/2011  . ADHD (attention deficit hyperactivity disorder) [F90.9] 10/14/2011   Total Time spent with patient: 15 minutes  Past Psychiatric History: See admission H&P  Past Medical History:  Past Medical  History:  Diagnosis Date  . ADHD (attention deficit hyperactivity disorder)   . Asthma   . GERD (gastroesophageal reflux disease)   . Transaminitis 07/17/2015    Past Surgical History:  Procedure Laterality Date  . TYMPANOPLASTY Left 1997   Family History:  Family History  Problem Relation Age of Onset  . Coronary artery disease Father   . Heart attack Father   . Heart disease Father   . Heart failure Mother   . Hyperlipidemia Mother   . Hypertension Mother   . Migraines Mother   . COPD Mother   . Diabetes Maternal Aunt   . Asthma Maternal Grandfather   . Asthma Paternal Grandfather    Family Psychiatric  History: See admission H&P Social History:  Social History   Substance and Sexual Activity  Alcohol Use Yes  . Alcohol/week: 0.0 standard drinks     Social History   Substance and Sexual Activity  Drug Use Yes  . Types: Marijuana, Cocaine   Comment:                      Social History   Socioeconomic History  . Marital status: Single    Spouse name: Not on file  . Number of children: 2  . Years of education: 12th grade  . Highest education level: 12th grade  Occupational History  . 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 Financial resource strain: Hard  . Food insecurity:    Worry: Sometimes true    Inability: Often true  . Transportation needs:    Medical: Yes    Non-medical: Yes  Tobacco Use  . Smoking status: Current Every Day Smoker    Packs/day: 0.50  Types: Cigarettes  . Smokeless tobacco: Never Used  Substance and Sexual Activity  . Alcohol use: Yes    Alcohol/week: 0.0 standard drinks  . Drug use: Yes    Types: Marijuana, Cocaine    Comment:                    . Sexual activity: Not Currently  Lifestyle  . Physical activity:    Days per week: 0 days    Minutes per session: 0 min  . Stress: Very much  Relationships  . Social connections:    Talks on phone: Once a week    Gets together: Never    Attends religious service:  Never    Active member of club or organization: No    Attends meetings of clubs or organizations: Not on file    Relationship status: Divorced  Other Topics Concern  . Not on file  Social History Narrative  . Not on file   Additional Social History:                         Sleep: Fair  Appetite:  Poor  Current Medications: Current Facility-Administered Medications  Medication Dose Route Frequency Provider Last Rate Last Dose  . acetaminophen (TYLENOL) tablet 650 mg  650 mg Oral Q6H PRN [name], NP      . albuterol (PROVENTIL HFA;VENTOLIN HFA) 108 (90 Base) MCG/ACT inhaler 1-2 puff  1-2 puff Inhalation Q4H PRN Donell Sievert E, PA-C   2 puff at 02/02/18 1010  . albuterol (PROVENTIL) (2.5 MG/3ML) 0.083% nebulizer solution 2.5 mg  2.5 mg Nebulization Q4H PRN Antonieta Pert, MD   2.5 mg at 02/01/18 2158  . alum & mag hydroxide-simeth (MAALOX/MYLANTA) 200-200-20 MG/5ML suspension 30 mL  30 mL Oral Q4H PRN [name], NP      . ARIPiprazole (ABILIFY) tablet 5 mg  5 mg Oral Daily Antonieta Pert, MD   5 mg at 02/02/18 1209  . chlordiazePOXIDE (LIBRIUM) capsule 25 mg  25 mg Oral Q6H PRN Antonieta Pert, MD   25 mg at 02/01/18 1647  . fluticasone (FLOVENT HFA) 44 MCG/ACT inhaler 2 puff  2 puff Inhalation BID Antonieta Pert, MD      . gabapentin (NEURONTIN) capsule 400 mg  400 mg Oral TID Antonieta Pert, MD   400 mg at 02/02/18 1207  . hydrOXYzine (ATARAX/VISTARIL) tablet 25 mg  25 mg Oral TID PRN Donell Sievert E, PA-C   25 mg at 02/02/18 1011  . loperamide (IMODIUM) capsule 2-4 mg  2-4 mg Oral PRN Antonieta Pert, MD      . magnesium hydroxide (MILK OF MAGNESIA) suspension 30 mL  30 mL Oral Daily PRN [name], NP      . mirtazapine (REMERON) tablet 30 mg  30 mg Oral QHS Antonieta Pert, MD      . multivitamin with minerals tablet 1 tablet  1 tablet Oral Daily Antonieta Pert, MD   1 tablet at 02/02/18 1009  . nicotine  (NICODERM CQ - dosed in mg/24 hours) patch 21 mg  21 mg Transdermal Daily Antonieta Pert, MD   21 mg at 02/02/18 1010  . ondansetron (ZOFRAN-ODT) disintegrating tablet 4 mg  4 mg Oral Q6H PRN Antonieta Pert, MD      . pantoprazole (PROTONIX) EC tablet 40 mg  40 mg Oral Daily Donell Sievert E, PA-C   40 mg  at 02/02/18 1009  . predniSONE (STERAPRED UNI-PAK 21 TAB) tablet 5 mg  5 mg Oral 4X daily taper [name], NP   5 mg at 02/02/18 1207  . thiamine (VITAMIN B-1) tablet 100 mg  100 mg Oral Daily Antonieta Pert, MD   100 mg at 02/02/18 1009    Lab Results:  Results for orders placed or performed during the hospital encounter of 01/31/18 (from the past 48 hour(s))  Hepatic function panel     Status: Abnormal   Collection Time: 02/02/18  6:34 AM  Result Value Ref Range   Total Protein 7.0 6.5 - 8.1 g/dL   Albumin 3.7 3.5 - 5.0 g/dL   AST 23 15 - 41 U/L   ALT 27 0 - 44 U/L   Alkaline Phosphatase 55 38 - 126 U/L   Total Bilirubin 0.3 0.3 - 1.2 mg/dL   Bilirubin, Direct 0.1 0.0 - 0.2 mg/dL   Indirect Bilirubin 0.2 (L) 0.3 - 0.9 mg/dL    Comment: Performed at [facility], [address] [place], [place] [address]    Blood Alcohol level:  Lab Results  Component Value Date   ETH 117 (H) 01/31/2018   ETH <10 06/28/2017    Metabolic Disorder Labs: No results found for: HGBA1C, MPG No results found for: PROLACTIN No results found for: CHOL, TRIG, HDL, CHOLHDL, VLDL, LDLCALC  Physical Findings: AIMS: Facial and Oral Movements Muscles of Facial Expression: None, normal Lips and Perioral Area: None, normal Jaw: None, normal Tongue: None, normal,Extremity Movements Upper (arms, wrists, hands, fingers): None, normal Lower (legs, knees, ankles, toes): None, normal, Trunk Movements Neck, shoulders, hips: None, normal, Overall Severity Severity of abnormal movements (highest score from questions above): None, normal Incapacitation due to abnormal  movements: None, normal Patient's awareness of abnormal movements (rate only patient's report): No Awareness, Dental Status Current problems with teeth and/or dentures?: No Does patient usually wear dentures?: No  CIWA:  CIWA-Ar Total: 1 COWS:  COWS Total Score: 2  Musculoskeletal: Strength & Muscle Tone: within normal limits Gait & Station: normal Patient leans: N/A  Psychiatric Specialty Exam: Physical Exam  Nursing note and vitals reviewed. Constitutional: He is oriented to person, place, and time. He appears well-developed and well-nourished.  HENT:  Head: Normocephalic and atraumatic.  Respiratory: Effort normal.  Neurological: He is alert and oriented to person, place, and time.    ROS  Blood pressure 122/83, pulse 60, temperature 97.9 F (36.6 C), temperature source Oral, resp. rate 16, height 6' (1.829 m), weight 83.9 kg, SpO2 100 %.Body mass index is 25.09 kg/m.  General Appearance: Disheveled  Eye Contact:  Minimal  Speech:  Normal Rate  Volume:  Decreased  Mood:  Anxious, Depressed and Dysphoric  Affect:  Congruent  Thought Process:  Coherent and Descriptions of Associations: Intact  Orientation:  Full (Time, Place, and Person)  Thought Content:  Logical  Suicidal Thoughts:  No  Homicidal Thoughts:  No  Memory:  Immediate;   Fair Recent;   Fair Remote;   Fair  Judgement:  Impaired  Insight:  Fair  Psychomotor Activity:  Increased  Concentration:  Concentration: Fair and Attention Span: Fair  Recall:  [employer] of Knowledge:  Fair  Language:  Fair  Akathisia:  Negative  Handed:  Right  AIMS (if indicated):     Assets:  Desire for Improvement Resilience  ADL's:  Intact  Cognition:  WNL  Sleep:  Number of Hours: 5.25  Treatment Plan Summary: Daily contact with patient to assess and evaluate symptoms and progress in treatment, Medication management and Plan : Patient is seen and examined.  Patient is a 37 year old male with the above-stated past  psychiatric history seen in follow-up.  #1 major depression/generalized anxiety disorder-increase mirtazapine to 30 mg p.o. nightly, increase gabapentin to 400 mg p.o. 3 times daily, continue Vistaril 25 mg p.o. as needed anxiety.  Add Abilify 5 mg p.o. daily for mood stability and depression.  #2 cocaine dependence/cannabis dependence-patient is desiring to get into a substance abuse treatment facility after discharge.  Social work is working on that.  #3 asthma-lungs are clear today.  He continues on albuterol both inhalers as well as nebulization, as well as oral steroids.  He had been previously on Symbicort, and we will add Flovent today.  #4 disposition planning-in progress.  Antonieta Pert, MD 02/02/2018, 12:51 PM

## 2018-02-02 NOTE — Progress Notes (Signed)
Recreation Therapy Notes  Date: 10.14.19 Time: 0930 Location: [address] Dayroom  Group Topic: Stress Management  Goal Area(s) Addresses:  Patient will verbalize importance of using healthy stress management.  Patient will identify positive emotions associated with healthy stress management.  Intervention: Stress Management  Activity :  Guided Imagery.  LRT introduced patients to the stress management technique of guided imagery.    Education:  Stress Management, Discharge Planning.   Education Outcome: Acknowledges edcuation/In group clarification offered/Needs additional education  Clinical Observations/Feedback: Pt did not attend group.    Devin [name], LRT/CTRS         Devin [name] A 02/02/2018 12:51 PM 

## 2018-02-02 NOTE — [facility] Group Notes (Signed)
Pt was invited but did not attend orientation/goals group. 

## 2018-02-02 NOTE — [facility] Group Notes (Signed)
LCSW Group Therapy Note   02/02/2018 1:15pm   Type of Therapy and Topic:  Group Therapy:  Overcoming Obstacles   Participation Level:  Active   Description of Group:    In this group patients will be encouraged to explore what they see as obstacles to their own wellness and recovery. They will be guided to discuss their thoughts, feelings, and behaviors related to these obstacles. The group will process together ways to cope with barriers, with attention given to specific choices patients can make. Each patient will be challenged to identify changes they are motivated to make in order to overcome their obstacles. This group will be process-oriented, with patients participating in exploration of their own experiences as well as giving and receiving support and challenge from other group members.   Therapeutic Goals: 1. Patient will identify personal and current obstacles as they relate to admission. 2. Patient will identify barriers that currently interfere with their wellness or overcoming obstacles.  3. Patient will identify feelings, thought process and behaviors related to these barriers. 4. Patient will identify two changes they are willing to make to overcome these obstacles:      Summary of Patient Progress   Devin [name] was attentive and engaged during today's processing group. Pt tearful and anxious, but able to process his feelings and his goal of "learning better coping skills and learning how to avoid relapse." Devin [name] identified his biggest obstacle as his recent relapse and the "irreversible damage it caused my relationship." He hopes to work things out with his pregnant girlfriend and plans to have a phone conversation with her at 3pm today. Devin [name] was receptive to writing his thoughts and feelings in his journal and sharing this with her during their phone call today.    Therapeutic Modalities:   Cognitive Behavioral Therapy Solution Focused Therapy Motivational Interviewing Relapse  Prevention Therapy  Rona Ravens, LCSW 02/02/2018 11:56 AM

## 2018-02-02 NOTE — Progress Notes (Signed)
Chaplain provided support with pt following group.   Devin [name] was oriented x4, initially tearful.   During group, he had expressed affirmation with others who stated that they numbed emotions and subsequently felt overwhelmed and ill equipped when they experienced emotions.  Provided pastoral presence and support with Devin [name] as he expressed frustration and fear around relationship and health. Worked to orient toward goal for today and provide affirmation.      Devin [name] described feeling like a failure due to relapse, stating his long-time girlfriend had initially refused to speak with him following his self-harm.  He is fearful that he has damaged this relationship.  He stated she did speak with him last evening. In this conversation she spoke in future-oriented terms - calling Devin [name] her fiance.  Devin [name] described this as a "a bit of hope."  Devin [name] is supposed to call her this afternoon at 4p.  He does not yet know what he hopes for in this conversation.      Devin [name] is also hopeful to reach out to his children today and hope their mother will allow them to visit.    Devin [name] expressed ongoing frustration with being turned down for disability.  Described not working due to asthma, so has no insurance and cannot afford inhalers.  Stated his asthma also prevents him from going outside to play with his children and this contributes to feelings of worthlessness.

## 2018-02-02 NOTE — Plan of Care (Signed)
  Problem: Activity: Goal: Sleeping patterns will improve Outcome: Progressing Note:  He reported sleeping better with the addition of of remeron

## 2018-02-02 NOTE — Tx Team (Signed)
Interdisciplinary Treatment and Diagnostic Plan Update  02/02/2018 Time of Session: 0830AM Devin [name] MRN: [id number]  Principal Diagnosis: MDD (major depressive disorder), recurrent severe, without psychosis ([place])  Secondary Diagnoses: Principal Problem:   MDD (major depressive disorder), recurrent severe, without psychosis ([place])   Current Medications:  Current Facility-Administered Medications  Medication Dose Route Frequency Provider Last Rate Last Dose  . acetaminophen (TYLENOL) tablet 650 mg  650 mg Oral Q6H PRN Ethelene Hal, NP      . albuterol (PROVENTIL HFA;VENTOLIN HFA) 108 (90 Base) MCG/ACT inhaler 1-2 puff  1-2 puff Inhalation Q4H PRN Patriciaann Clan E, PA-C   2 puff at 02/01/18 1112  . albuterol (PROVENTIL) (2.5 MG/3ML) 0.083% nebulizer solution 2.5 mg  2.5 mg Nebulization Q4H PRN Sharma Covert, MD   2.5 mg at 02/01/18 2158  . alum & mag hydroxide-simeth (MAALOX/MYLANTA) 200-200-20 MG/5ML suspension 30 mL  30 mL Oral Q4H PRN Ethelene Hal, NP      . chlordiazePOXIDE (LIBRIUM) capsule 25 mg  25 mg Oral Q6H PRN Sharma Covert, MD   25 mg at 02/01/18 1647  . gabapentin (NEURONTIN) capsule 300 mg  300 mg Oral TID Ethelene Hal, NP   300 mg at 02/01/18 1844  . hydrOXYzine (ATARAX/VISTARIL) tablet 25 mg  25 mg Oral TID PRN Laverle Hobby, PA-C   25 mg at 02/01/18 2138  . loperamide (IMODIUM) capsule 2-4 mg  2-4 mg Oral PRN Sharma Covert, MD      . magnesium hydroxide (MILK OF MAGNESIA) suspension 30 mL  30 mL Oral Daily PRN Ethelene Hal, NP      . mirtazapine (REMERON) tablet 15 mg  15 mg Oral QHS,MR X 1 Patriciaann Clan E, PA-C   15 mg at 02/01/18 2300  . multivitamin with minerals tablet 1 tablet  1 tablet Oral Daily Sharma Covert, MD   1 tablet at 02/01/18 1111  . nicotine (NICODERM CQ - dosed in mg/24 hours) patch 21 mg  21 mg Transdermal Daily Sharma Covert, MD   21 mg at 02/01/18 1109  . ondansetron (ZOFRAN-ODT)  disintegrating tablet 4 mg  4 mg Oral Q6H PRN Sharma Covert, MD      . pantoprazole (PROTONIX) EC tablet 40 mg  40 mg Oral Daily Simon, Spencer E, PA-C      . pneumococcal 23 valent vaccine (PNU-IMMUNE) injection 0.5 mL  0.5 mL Intramuscular Tomorrow-1000 Sharma Covert, MD      . predniSONE (STERAPRED UNI-PAK 21 TAB) tablet 5 mg  5 mg Oral 4X daily taper Ethelene Hal, NP   5 mg at 02/01/18 1517  . thiamine (VITAMIN B-1) tablet 100 mg  100 mg Oral Daily Sharma Covert, MD   100 mg at 02/01/18 1112   PTA Medications: Medications Prior to Admission  Medication Sig Dispense Refill Last Dose  . albuterol (PROVENTIL HFA;VENTOLIN HFA) 108 (90 Base) MCG/ACT inhaler Inhale 1-2 puffs into the lungs every 6 (six) hours as needed for wheezing or shortness of breath. 1 Inhaler 1 01/31/2018 at Unknown time  . albuterol (PROVENTIL HFA;VENTOLIN HFA) 108 (90 Base) MCG/ACT inhaler Inhale 1-2 puffs into the lungs every 6 (six) hours as needed for wheezing or shortness of breath. 1 Inhaler 1 01/31/2018 at Unknown time  . benzonatate (TESSALON) 100 MG capsule Take 2 capsules (200 mg total) by mouth every 8 (eight) hours. 30 capsule 0 01/31/2018 at Unknown time  . fluticasone (FLONASE) 50 MCG/ACT nasal spray  Place 1 spray into both nostrils daily. 16 g 2 01/31/2018 at Unknown time  . ibuprofen (ADVIL,MOTRIN) 200 MG tablet Take 600 mg by mouth every 6 (six) hours as needed for moderate pain.    01/31/2018 at Unknown time  . naproxen (NAPROSYN) 500 MG tablet Take 1 tablet (500 mg total) by mouth 2 (two) times daily. 30 tablet 0 01/31/2018 at Unknown time  . ondansetron (ZOFRAN ODT) 4 MG disintegrating tablet Take 1 tablet (4 mg total) by mouth every 8 (eight) hours as needed for nausea or vomiting. 3 tablet 0 01/31/2018 at Unknown time    Patient Stressors: Educational concerns Financial difficulties Marital or family conflict  Patient Strengths: Ability for insight Active sense of  humor Average or above average intelligence Capable of independent living Warehouse manager means  Treatment Modalities: Medication Management, Group therapy, Case management,  1 to 1 session with clinician, Psychoeducation, Recreational therapy.   Physician Treatment Plan for Primary Diagnosis: MDD (major depressive disorder), recurrent severe, without psychosis ([place]) Long Term Goal(s): Improvement in symptoms so as ready for discharge Improvement in symptoms so as ready for discharge   Short Term Goals: Ability to identify changes in lifestyle to reduce recurrence of condition will improve Ability to verbalize feelings will improve Ability to disclose and discuss suicidal ideas Ability to demonstrate self-control will improve Ability to identify and develop effective coping behaviors will improve Ability to maintain clinical measurements within normal limits will improve Compliance with prescribed medications will improve Ability to identify triggers associated with substance abuse/mental health issues will improve  Medication Management: Evaluate patient's response, side effects, and tolerance of medication regimen.  Therapeutic Interventions: 1 to 1 sessions, Unit Group sessions and Medication administration.  Evaluation of Outcomes: Not Met  Physician Treatment Plan for Secondary Diagnosis: Principal Problem:   MDD (major depressive disorder), recurrent severe, without psychosis ([place])  Long Term Goal(s): Improvement in symptoms so as ready for discharge Improvement in symptoms so as ready for discharge   Short Term Goals: Ability to identify changes in lifestyle to reduce recurrence of condition will improve Ability to verbalize feelings will improve Ability to disclose and discuss suicidal ideas Ability to demonstrate self-control will improve Ability to identify and develop effective coping behaviors will improve Ability to maintain clinical measurements  within normal limits will improve Compliance with prescribed medications will improve Ability to identify triggers associated with substance abuse/mental health issues will improve     Medication Management: Evaluate patient's response, side effects, and tolerance of medication regimen.  Therapeutic Interventions: 1 to 1 sessions, Unit Group sessions and Medication administration.  Evaluation of Outcomes: Not Met   RN Treatment Plan for Primary Diagnosis: MDD (major depressive disorder), recurrent severe, without psychosis ([place]) Long Term Goal(s): Knowledge of disease and therapeutic regimen to maintain health will improve  Short Term Goals: Ability to remain free from injury will improve, Ability to verbalize frustration and anger appropriately will improve, Ability to disclose and discuss suicidal ideas and Ability to identify and develop effective coping behaviors will improve  Medication Management: RN will administer medications as ordered by provider, will assess and evaluate patient's response and provide education to patient for prescribed medication. RN will report any adverse and/or side effects to prescribing provider.  Therapeutic Interventions: 1 on 1 counseling sessions, Psychoeducation, Medication administration, Evaluate responses to treatment, Monitor vital signs and CBGs as ordered, Perform/monitor CIWA, COWS, AIMS and Fall Risk screenings as ordered, Perform wound care treatments as ordered.  Evaluation of Outcomes:  Not Met   LCSW Treatment Plan for Primary Diagnosis: MDD (major depressive disorder), recurrent severe, without psychosis ([place]) Long Term Goal(s): Safe transition to appropriate next level of care at discharge, Engage patient in therapeutic group addressing interpersonal concerns.  Short Term Goals: Engage patient in aftercare planning with referrals and resources, Facilitate acceptance of mental health diagnosis and concerns and Identify triggers associated  with mental health/substance abuse issues  Therapeutic Interventions: Assess for all discharge needs, 1 to 1 time with Social worker, Explore available resources and support systems, Assess for adequacy in community support network, Educate family and significant other(s) on suicide prevention, Complete Psychosocial Assessment, Interpersonal group therapy.  Evaluation of Outcomes: Not Met   Progress in Treatment: Attending groups: No. New to unit. Continuing to assess.  Participating in groups: No. Taking medication as prescribed: Yes. Toleration medication: Yes. Family/Significant other contact made: No, will contact:  contact attempt x1 with brother Patient understands diagnosis: Yes. Discussing patient identified problems/goals with staff: Yes. Medical problems stabilized or resolved: Yes. Denies suicidal/homicidal ideation: Yes. Issues/concerns per patient self-inventory: No. Other: n/a   New problem(s) identified: No, Describe:  n/a  New Short Term/Long Term Goal(s): detox, medication management for mood stabilization; elimination of SI thoughts; development of comprehensive mental wellness/sobriety plan.   Patient Goals:  "to learn how to cope with life stress better."   Discharge Plan or Barriers: CSW assessing for appropriate referrals. [facility] pamphlet, Mobile Crisis information, and AA/NA information provided to patient for additional community support and resources.   Reason for Continuation of Hospitalization: Depression Medication stabilization Suicidal ideation Withdrawal symptoms  Estimated Length of Stay: Wed, 02/04/18  Attendees: Patient: 02/02/2018 8:50 AM  Physician: Dr. Nancy Fetter MD; Dr. Mallie Darting MD 02/02/2018 8:50 AM  Nursing: Vladimir Faster RN; [facility] RN 02/02/2018 8:50 AM  RN Care Manager:x 02/02/2018 8:50 AM  Social Worker: Janice Norrie LCSW 02/02/2018 8:50 AM  Recreational Therapist: x 02/02/2018 8:50 AM  Other: x 02/02/2018 8:50 AM  Other:  02/02/2018 8:50 AM   Other: 02/02/2018 8:50 AM    Scribe for Treatment Team: Avelina Laine, LCSW 02/02/2018 8:50 AM

## 2018-02-02 NOTE — Progress Notes (Signed)
D:  Devin [name] has been up and visible on the unit.  He was sitting in the day room most of the evening.  He interacts well with staff and peers.  He attended evening AA group and stated he enjoyed meeting and getting a schedule of AA meetings in the area.  He denied SI/HI or A/V hallucinations.  He reported sleeping better last night with the Remeron.  He complained of shortness of breath and prn nebulizer given with good relief.  He requested Librium but explained that is only for withdrawal symptoms but agreed to try hydroxyzine.  He is currently resting with his eyes closed and appears to be asleep. A:  1:1 with RN for support and encouragement.  Medications as ordered.  Q 15 minute checks maintained for safety.  Encouraged participation in group and unit activities.   R:  Devin [name] remains safe on the unit.  We will continue to monitor the progress towards his goals.

## 2018-02-02 NOTE — Plan of Care (Signed)
  Problem: Medication: Goal: Compliance with prescribed medication regimen will improve Outcome: Completed/Met Note:  Devin [name] takes his prescribed medications without difficulty and will seek out staff for medication needs

## 2018-02-03 DIAGNOSIS — G4700 Insomnia, unspecified: Secondary | ICD-10-CM

## 2018-02-03 NOTE — Progress Notes (Signed)
D:  Arcenio was in the day room much of the evening playing cards with his peers.  He interacted well with staff and peers.  He attended evening wrap up group.  He stated his day was "ok, I am just taking it moment by moment."  He denied SI/HI or A/V hallucinations.  He declined taking his Flovent because his dose was just given at 4:30pm and wanted to just get it in the morning.  He did request prn albuterol at hs for shortness of breath.  He denied any symptoms of withdrawal.  He is currently resting with his eyes closed and appeared to be asleep. A:  1:1 with RN for support and encouragement.  Medications as ordered.  Q 15 minute checks maintained for safety.  Encouraged participation in group and unit activities.   R:  Euan remains safe on the unit.  We will continue to monitor the progress towards his goals.

## 2018-02-03 NOTE — Progress Notes (Signed)
[facility] Group Notes:  (Nursing/MHT/Case Management/Adjunct)  Date:  02/03/2018  Time:  2045 Type of Therapy:  wrap up group  Participation Level:  Active  Participation Quality:  Appropriate, Attentive, Sharing and Supportive  Affect:  Appropriate  Cognitive:  Appropriate  Insight:  Improving  Engagement in Group:  Engaged  Modes of Intervention:  Clarification, Education and Support  Summary of Progress/Problems: If pt could change any one thing about his life, he would have never done cocaine at age 37. Pt wants to speak with the doctor today about being treated for PTSD. Pt is grateful for his 3 kids.   Johann Capers S 02/03/2018, 9:01 PM

## 2018-02-03 NOTE — Progress Notes (Signed)
Recreation Therapy Notes  Animal-Assisted Activity (AAA) Program Checklist/Progress Notes Patient Eligibility Criteria Checklist & Daily Group note for Rec Tx Intervention  Date: 10.15.19 Time: 1430 Location: [address] Dayroom   AAA/T Program Assumption of Risk Form signed by Patient/ or Parent Legal Guardian YES   Patient is free of allergies or sever asthma YES   Patient reports no fear of animals YES  Patient reports no history of cruelty to animals YES   Patient understands his/her participation is voluntary YES   Patient washes hands before animal contact YES   Patient washes hands after animal contact  YES   Education: Hand Washing, Appropriate Animal Interaction   Education Outcome: Acknowledges understanding/In group clarification offered/Needs additional education.   Clinical Observations/Feedback: Pt did not attend activity.    Deigo Alonso, LRT/CTRS         Randee Upchurch A 02/03/2018 3:49 PM 

## 2018-02-03 NOTE — Progress Notes (Signed)
Nursing Progress Note: 7p-7a D: Pt currently presents with a anxious/pleasant affect and behavior. Interacting appropriately with the milieu. Pt reports good sleep during the previous night with current medication regimen. Pt did attend wrap-up group.  A: Pt provided with medications per providers orders. Pt's labs and vitals were monitored throughout the night. Pt supported emotionally and encouraged to express concerns and questions. Pt educated on medications.  R: Pt's safety ensured with 15 minute and environmental checks. Pt currently denies SI, HI, and AVH. Pt verbally contracts to seek staff if SI,HI, or AVH occurs and to consult with staff before acting on any harmful thoughts. Will continue to monitor.  

## 2018-02-03 NOTE — Plan of Care (Signed)
Problem: Safety: Goal: Ability to remain free from injury will improve Outcome: Progressing   Problem: Education: Goal: Ability to make informed decisions regarding treatment will improve Outcome: Progressing   Problem: Activity: Goal: Interest or engagement in leisure activities will improve Outcome: Progressing  DAR Note: Pt A & O X4. Presents with depressed affect and irritable mood at intervals during shift when redirections or unit rules discussed due to multiple request of wanting whatever peers' had as in drinks, medications. Denies SI, HI, AVH, pain and withdrawal symptoms when assessed "I'm alright, right now". Visible in groups, went off unit for activities. Showered, changed his clothes.  Pt has been medication compliant. Denies adverse drug reactions. Tolerates all PO intake well. Attended and participated in groups and off unit activities as scheduled.  Scheduled and PRN (Albutero & Vistaril) medications given as prescribed with verbal education and effects monitored. Encouraged pt to voice concerns, attend to ADLS and comply with current treatment regimen including groups. Safety checks maintained without self harm gestures or outburst to note at this time.  Pt tolerated all PO intake well. Denies concerns at this time. POC maintained for safety and mood stability.

## 2018-02-03 NOTE — Progress Notes (Signed)
[facility] MD Progress Note  02/03/2018 1:19 PM Devin [name]  MRN:  [id number] Subjective: Patient reports ongoing depression and anxiety.  Denies suicidal ideations.  Attributes some of his depression and anxiety to finding out out that his girlfriend wants to relocate to [place] without him. Reports some subjective feelings of withdrawal such as feeling "jittery" vaguely tremulous. Denies medication side effects. Denies suicidal ideations. Objective: I have discussed case with treatment team and have met with patient. 37 year old male, presented with depression, suicidal thoughts of running into traffic, recent relapse on cannabis, cocaine.  Reports prior history of depression.  Today patient describes persistent depression, some anxiety, which he attributes in part to relationship stress with his significant other. Behavior on unit in good control, visible in dayroom, interactive with peers. He denies suicidal ideations. Currently does not endorse medication side effects.  Describes some subjective feelings of being "jittery", which he attributes to withdrawal.  Does not present in any acute distress.  No significant distal tremors, no diaphoresis, no psychomotor agitation or restlessness noted. Principal Problem: MDD (major depressive disorder), recurrent severe, without psychosis ([place]) Diagnosis:   Patient Active Problem List   Diagnosis Date Noted  . Major depressive disorder, recurrent, severe without psychotic features ([place]) [F33.2] 01/31/2018  . MDD (major depressive disorder), recurrent severe, without psychosis ([place]) [F33.2] 01/31/2018  . Cocaine abuse with cocaine-induced mood disorder ([place]) [F14.14] 03/22/2017  . Hypoxia [R09.02]   . COPD exacerbation ([place]) [J44.1]   . Leukocytosis [D72.829] 11/04/2015  . Transaminitis [R74.0] 07/17/2015  . Tooth ache [K08.89] 07/17/2015  . Asthma with status asthmaticus [J45.902] 10/14/2011  . ADHD (attention deficit hyperactivity disorder)  [F90.9] 10/14/2011   Total Time spent with patient: 20 minutes  Past Psychiatric History: See admission H&P  Past Medical History:  Past Medical History:  Diagnosis Date  . ADHD (attention deficit hyperactivity disorder)   . Asthma   . GERD (gastroesophageal reflux disease)   . Transaminitis 07/17/2015    Past Surgical History:  Procedure Laterality Date  . TYMPANOPLASTY Left 1997   Family History:  Family History  Problem Relation Age of Onset  . Coronary artery disease Father   . Heart attack Father   . Heart disease Father   . Heart failure Mother   . Hyperlipidemia Mother   . Hypertension Mother   . Migraines Mother   . COPD Mother   . Diabetes Maternal Aunt   . Asthma Maternal Grandfather   . Asthma Paternal Grandfather    Family Psychiatric  History: See admission H&P Social History:  Social History   Substance and Sexual Activity  Alcohol Use Yes  . Alcohol/week: 0.0 standard drinks     Social History   Substance and Sexual Activity  Drug Use Yes  . Types: Marijuana, Cocaine   Comment:                      Social History   Socioeconomic History  . Marital status: Single    Spouse name: Not on file  . Number of children: 2  . Years of education: 12th grade  . Highest education level: 12th grade  Occupational History  . 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 Financial resource strain: Hard  . Food insecurity:    Worry: Sometimes true    Inability: Often true  . Transportation needs:    Medical: Yes    Non-medical: Yes  Tobacco Use  . Smoking status: Current Every Day Smoker  Packs/day: 0.50    Types: Cigarettes  . Smokeless tobacco: Never Used  Substance and Sexual Activity  . Alcohol use: Yes    Alcohol/week: 0.0 standard drinks  . Drug use: Yes    Types: Marijuana, Cocaine    Comment:                    . Sexual activity: Not Currently  Lifestyle  . Physical activity:    Days per week: 0 days    Minutes per session: 0  min  . Stress: Very much  Relationships  . Social connections:    Talks on phone: Once a week    Gets together: Never    Attends religious service: Never    Active member of club or organization: No    Attends meetings of clubs or organizations: Not on file    Relationship status: Divorced  Other Topics Concern  . Not on file  Social History Narrative  . Not on file   Additional Social History:   Sleep: Improving  Appetite:  Improving  Current Medications: Current Facility-Administered Medications  Medication Dose Route Frequency Provider Last Rate Last Dose  . acetaminophen (TYLENOL) tablet 650 mg  650 mg Oral Q6H PRN Ethelene Hal, NP      . albuterol (PROVENTIL HFA;VENTOLIN HFA) 108 (90 Base) MCG/ACT inhaler 1-2 puff  1-2 puff Inhalation Q4H PRN Patriciaann Clan E, PA-C   2 puff at 02/03/18 1153  . albuterol (PROVENTIL) (2.5 MG/3ML) 0.083% nebulizer solution 2.5 mg  2.5 mg Nebulization Q4H PRN Sharma Covert, MD   2.5 mg at 02/01/18 2158  . alum & mag hydroxide-simeth (MAALOX/MYLANTA) 200-200-20 MG/5ML suspension 30 mL  30 mL Oral Q4H PRN Ethelene Hal, NP      . ARIPiprazole (ABILIFY) tablet 5 mg  5 mg Oral Daily Sharma Covert, MD   5 mg at 02/03/18 0824  . chlordiazePOXIDE (LIBRIUM) capsule 25 mg  25 mg Oral Q6H PRN Sharma Covert, MD   25 mg at 02/01/18 1647  . fluticasone (FLOVENT HFA) 44 MCG/ACT inhaler 2 puff  2 puff Inhalation BID Sharma Covert, MD   2 puff at 02/03/18 [phone]  . gabapentin (NEURONTIN) capsule 400 mg  400 mg Oral TID Sharma Covert, MD   400 mg at 02/03/18 1152  . hydrOXYzine (ATARAX/VISTARIL) tablet 25 mg  25 mg Oral TID PRN Patriciaann Clan E, PA-C   25 mg at 02/03/18 1304  . loperamide (IMODIUM) capsule 2-4 mg  2-4 mg Oral PRN Sharma Covert, MD      . magnesium hydroxide (MILK OF MAGNESIA) suspension 30 mL  30 mL Oral Daily PRN Ethelene Hal, NP      . mirtazapine (REMERON) tablet 30 mg  30 mg Oral QHS Sharma Covert, MD   30 mg at 02/02/18 2127  . multivitamin with minerals tablet 1 tablet  1 tablet Oral Daily Sharma Covert, MD   1 tablet at 02/03/18 0827  . nicotine (NICODERM CQ - dosed in mg/24 hours) patch 21 mg  21 mg Transdermal Daily Sharma Covert, MD   21 mg at 02/03/18 0827  . ondansetron (ZOFRAN-ODT) disintegrating tablet 4 mg  4 mg Oral Q6H PRN Sharma Covert, MD      . pantoprazole (PROTONIX) EC tablet 40 mg  40 mg Oral Daily Laverle Hobby, PA-C   40 mg at 02/03/18 0827  . predniSONE (STERAPRED UNI-PAK 21 TAB) tablet  5 mg  5 mg Oral 4X daily taper Ethelene Hal, NP   5 mg at 02/03/18 1206  . thiamine (VITAMIN B-1) tablet 100 mg  100 mg Oral Daily Sharma Covert, MD   100 mg at 02/03/18 0827    Lab Results:  Results for orders placed or performed during the hospital encounter of 01/31/18 (from the past 48 hour(s))  Hepatic function panel     Status: Abnormal   Collection Time: 02/02/18  6:34 AM  Result Value Ref Range   Total Protein 7.0 6.5 - 8.1 g/dL   Albumin 3.7 3.5 - 5.0 g/dL   AST 23 15 - 41 U/L   ALT 27 0 - 44 U/L   Alkaline Phosphatase 55 38 - 126 U/L   Total Bilirubin 0.3 0.3 - 1.2 mg/dL   Bilirubin, Direct 0.1 0.0 - 0.2 mg/dL   Indirect Bilirubin 0.2 (L) 0.3 - 0.9 mg/dL    Comment: Performed at [facility], [place] [address] [place], [place] [address]    Blood Alcohol level:  Lab Results  Component Value Date   ETH 117 (H) 01/31/2018   ETH <10 65/99/3570    Metabolic Disorder Labs: No results found for: HGBA1C, MPG No results found for: PROLACTIN No results found for: CHOL, TRIG, HDL, CHOLHDL, VLDL, LDLCALC  Physical Findings: AIMS: Facial and Oral Movements Muscles of Facial Expression: None, normal Lips and Perioral Area: None, normal Jaw: None, normal Tongue: None, normal,Extremity Movements Upper (arms, wrists, hands, fingers): None, normal Lower (legs, knees, ankles, toes): None, normal, Trunk  Movements Neck, shoulders, hips: None, normal, Overall Severity Severity of abnormal movements (highest score from questions above): None, normal Incapacitation due to abnormal movements: None, normal Patient's awareness of abnormal movements (rate only patient's report): No Awareness, Dental Status Current problems with teeth and/or dentures?: No Does patient usually wear dentures?: No  CIWA:  CIWA-Ar Total: 0 COWS:  COWS Total Score: 2  Musculoskeletal: Strength & Muscle Tone: within normal limits Gait & Station: normal Patient leans: N/A  Psychiatric Specialty Exam: Physical Exam  Nursing note and vitals reviewed. Constitutional: He is oriented to person, place, and time. He appears well-developed and well-nourished.  HENT:  Head: Normocephalic and atraumatic.  Respiratory: Effort normal.  Neurological: He is alert and oriented to person, place, and time.    ROS no chest pain, no dyspnea, no vomiting  Blood pressure (!) 148/98, pulse (!) 110, temperature 98 F (36.7 C), temperature source Oral, resp. rate 16, height 6' (1.829 m), weight 83.9 kg, SpO2 100 %.Body mass index is 25.09 kg/m.  General Appearance: Fairly groomed  Eye Contact:  Good  Speech:  Normal Rate  Volume:  Normal  Mood:  Reports persistent depression  Affect:  Constricted, but reactive, smiles at times appropriately  Thought Process:  Linear and Descriptions of Associations: Intact  Orientation:  Full (Time, Place, and Person)  Thought Content:  No hallucinations, no delusions /not internally preoccupied  Suicidal Thoughts:  No-at this time denies suicidal ideations, denies self-injurious ideations also denies any homicidal ideations, specifically also denies any homicidal or violent ideations girlfriend, contracts for safety on unit  Homicidal Thoughts:  No  Memory:  Recent and remote grossly intact  Judgement:  Fair  Insight:  Fair  Psychomotor Activity:  Normal-does not appear to be in any acute  distress or discomfort  Concentration:  Concentration: Good and Attention Span: Good  Recall:  Good  Fund of Knowledge:  Good  Language:  Good  Akathisia:  Negative  Handed:  Right  AIMS (if indicated):     Assets:  Desire for Improvement Resilience  ADL's:  Intact  Cognition:  WNL  Sleep:  Number of Hours: 6.5   Assessment -37 year old male, presented for depression, anxiety, suicidal ideations.  Reported recent relapse (cocaine, cannabis, alcohol).  Reports persistent depression in the context of relationship tension, states girlfriend may be relocating soon.  Denies current suicidal ideations and presents future oriented, stating he may opt to go to [facility] ( rehab) which was helpful in the past, or may go to an [facility].  Currently denies medication side effects.   Treatment Plan Summary: Treatment plan reviewed as below today 10/15  Encourage group and milieu participation to work on coping skills and symptom reduction Encourage efforts to work on sobriety and relapse prevention Treatment team working on disposition planning Continue Abilify 5 mg daily for mood disorder Continue Neurontin 400 mg 3 times a day for anxiety/pain Continue Remeron 30 mg nightly for depression, anxiety, insomnia  Continue Librium 25 mg every 6 hours as needed symptoms of withdrawal as needed Continue Albuterol/Flovent inhaler for history of asthma Jenne Campus, MD 02/03/2018, 1:19 PM   Patient ID: Radene Knee, male   DOB: 1980/08/22, 37 y.o.   MRN: [id number]

## 2018-02-03 NOTE — [facility] Suicide Risk Assessment (Addendum)
[facility] INPATIENT:  Family/Significant Other Suicide Prevention Education  Suicide Prevention Education:  Education Completed; Evlyn Courier (pt's girlfriend) [phone] has been identified by the patient as the family member/significant other with whom the patient will be residing, and identified as the person(s) who will aid the patient in the event of a mental health crisis (suicidal ideations/suicide attempt).  With written consent from the patient, the family member/significant other has been provided the following suicide prevention education, prior to the and/or following the discharge of the patient.  The suicide prevention education provided includes the following:  Suicide risk factors  Suicide prevention and interventions  National Suicide Hotline telephone number  [facility] assessment telephone number  [facility] Emergency Assistance 911  [facility] and/or Residential Mobile Crisis Unit telephone number  Request made of family/significant other to:  Remove weapons (e.g., guns, rifles, knives), all items previously/currently identified as safety concern.    Remove drugs/medications (over-the-counter, prescriptions, illicit drugs), all items previously/currently identified as a safety concern.  The family member/significant other verbalizes understanding of the suicide prevention education information provided.  The family member/significant other agrees to remove the items of safety concern listed above.  Rona Ravens LCSW 02/03/2018, 2:25 PM

## 2018-02-04 ENCOUNTER — Ambulatory Visit (HOSPITAL_COMMUNITY): Payer: Self-pay

## 2018-02-04 MED ORDER — HYDROXYZINE HCL 50 MG PO TABS
50.0000 mg | ORAL_TABLET | Freq: Every day | ORAL | Status: DC
  Administered 2018-02-04 – 2018-02-08 (×5): 50 mg via ORAL
  Filled 2018-02-04: qty 1
  Filled 2018-02-04: qty 7
  Filled 2018-02-04 (×6): qty 1

## 2018-02-04 MED ORDER — ARIPIPRAZOLE 5 MG PO TABS
5.0000 mg | ORAL_TABLET | Freq: Once | ORAL | Status: AC
  Administered 2018-02-04: 5 mg via ORAL
  Filled 2018-02-04: qty 1

## 2018-02-04 MED ORDER — ARIPIPRAZOLE 10 MG PO TABS
10.0000 mg | ORAL_TABLET | Freq: Every day | ORAL | Status: DC
  Administered 2018-02-05: 10 mg via ORAL
  Filled 2018-02-04 (×3): qty 1

## 2018-02-04 NOTE — Progress Notes (Signed)
Nursing Progress Note: 7p-7a D: Pt currently presents with a anxious/pleasant affect and behavior. Pt states "I am just trying to get my breathing under control. It hasnt been the same since I got a flu shot." Interacting appropriately with the milieu. Pt reports good sleep during the previous night with current medication regimen. Pt did attend wrap-up group.  A: Pt provided with medications per providers orders. Pt's labs and vitals were monitored throughout the night. Pt supported emotionally and encouraged to express concerns and questions. Pt educated on medications.  R: Pt's safety ensured with 15 minute and environmental checks. Pt currently denies SI, HI, and AVH. Pt verbally contracts to seek staff if SI,HI, or AVH occurs and to consult with staff before acting on any harmful thoughts. Will continue to monitor.

## 2018-02-04 NOTE — Therapy (Signed)
Occupational Therapy Group Note  Date:  02/04/2018 Time:  12:52 PM  Group Topic/Focus:  Stress Management  Participation Level:  Active  Participation Quality:  Appropriate  Affect:  Blunted  Cognitive:  Appropriate  Insight: Improving  Engagement in Group:  Engaged  Modes of Intervention:  Activity, Discussion, Education and Socialization  Additional Comments:    S: "I have very high stress and it manifests in many different ways"  O: Stress management group completed to use as productive coping strategy, to help mitigate maladaptive coping to integrate in functional BADL/IADL when reintegrating into community. Education given on the definition of stress and its cognitive, behavioral, emotional, and physical effects on the body. Stress management tools worksheet completed to identify negative coping mechanisms and their short and long term effects vs positive coping mechanisms with demonstration. Coping strategies taught include: relaxation based- deep breathing, counting to 10, taking a 1 minute vacation, acceptance, stress balls, relaxation audio/video, visual/mental imagery. Positive mental attitude- gratitude, acceptance, cognitive reframing, positive self talk, anger management. Gratitude journaling handout and instruction also given. Adult coloring and relaxation tips worksheet given at end of session.   A: Pt actively engaged in OT treatment this date, sharing many examples and opinions with facilitator and other group members. Pt identified that majority of her stress management this date is maladaptive (substance use, supression etc.) Pt actively interested in positive coping mechanisms discussed. He shares that his main focus in anger management, but he is willing to practice gratitudes and relaxation techniques. He shares that he has been practicing deep breathing this date.   P: Pt provided with education on stress management activities to implement into daily routine.  Handouts given to facilitate carryover when reintegrating into community   Dalphine Handing, MSOT, OTR/L Behavioral Health OT/ Acute Relief OT  Dalphine Handing 02/04/2018, 12:52 PM

## 2018-02-04 NOTE — Patient Outreach (Signed)
CPSS was able to speak with Pt and process with Pt about what took place from the last visit to now. CPSS addressed the fact that Pt does not need to continue to look down on himself an be happy that he was able to get into [facility] to receive the help needed. CPSS addressed the concerns that he was having and offered support at this time. CPSS gave Pt contact information for Pt to follow up with CPSS in the community at any time.

## 2018-02-04 NOTE — [facility] Group Notes (Signed)
LCSW Group Therapy Note 02/04/2018 11:45 AM  Type of Therapy and Topic: Group Therapy: Overcoming Obstacles  Participation Level: Active  Description of Group:  In this group patients will be encouraged to explore what they see as obstacles to their own wellness and recovery. They will be guided to discuss their thoughts, feelings, and behaviors related to these obstacles. The group will process together ways to cope with barriers, with attention given to specific choices patients can make. Each patient will be challenged to identify changes they are motivated to make in order to overcome their obstacles. This group will be process-oriented, with patients participating in exploration of their own experiences as well as giving and receiving support and challenge from other group members.  Therapeutic Goals: 1. Patient will identify personal and current obstacles as they relate to admission. 2. Patient will identify barriers that currently interfere with their wellness or overcoming obstacles.  3. Patient will identify feelings, thought process and behaviors related to these barriers. 4. Patient will identify two changes they are willing to make to overcome these obstacles:   Summary of Patient Progress  Devin [name] was engaged and participated throughout the group session. Devin [name] reports that his main obstacle is "negative thinking". Devin [name] states that he does not like to express his emotions or problems. Devin [name] reports that he plans on building his support system.     Therapeutic Modalities:  Cognitive Behavioral Therapy Solution Focused Therapy Motivational Interviewing Relapse Prevention Therapy   Alcario Drought Clinical Social Worker

## 2018-02-04 NOTE — Progress Notes (Addendum)
[facility] MD Progress Note  02/04/2018 1:17 PM Devin [name]  MRN:  [id number]   Subjective: Patient states today that he feels that things are improving for him hall he has been here, however he feels that his mood is still depressed and he feels like he can be improved some.  He reports that he still gets a little agitated with his wife when he is trying to contact her.  He does however deny any suicidal or homicidal ideations and denies any hallucinations.  He states that he has been diagnosed with bipolar in the past and details episodes of a hypomanic and depressed moods concurrent with bipolar disorder.  He does state however that he has been eating good and that he has slept a little bit better last night, but he is still been waking up during the night.  Patient also reports that he does not want to go to a residential treatment facility he would like to go to an [facility] when he is discharged.  However patient states that he has not started contacting an [facility] and is not sure of which when he would go to.  Objective: Patient's chart and findings reviewed and discussed with treatment team.  Patient is pleasant, calm, and cooperative.  Patient is in the day room and has been seen interacting with peers and staff appropriately.  Patient has been attending groups and interacting.  Patient is talkative today and has been seen talking to numerous people on the unit and he has a bright affect today.  Principal Problem: Bipolar II disorder (HCC) Diagnosis:   Patient Active Problem List   Diagnosis Date Noted  . Major depressive disorder, recurrent, severe without psychotic features (HCC) [F33.2] 01/31/2018  . Bipolar II disorder (HCC) [F31.81] 01/31/2018  . Cocaine abuse with cocaine-induced mood disorder (HCC) [F14.14] 03/22/2017  . Hypoxia [R09.02]   . COPD exacerbation (HCC) [J44.1]   . Leukocytosis [D72.829] 11/04/2015  . Transaminitis [R74.0] 07/17/2015  . Tooth ache [K08.89] 07/17/2015   . Asthma with status asthmaticus [J45.902] 10/14/2011  . ADHD (attention deficit hyperactivity disorder) [F90.9] 10/14/2011   Total Time spent with patient: 30 minutes  Past Psychiatric History: See H&P  Past Medical History:  Past Medical History:  Diagnosis Date  . ADHD (attention deficit hyperactivity disorder)   . Asthma   . GERD (gastroesophageal reflux disease)   . Transaminitis 07/17/2015    Past Surgical History:  Procedure Laterality Date  . TYMPANOPLASTY Left 1997   Family History:  Family History  Problem Relation Age of Onset  . Coronary artery disease Father   . Heart attack Father   . Heart disease Father   . Heart failure Mother   . Hyperlipidemia Mother   . Hypertension Mother   . Migraines Mother   . COPD Mother   . Diabetes Maternal Aunt   . Asthma Maternal Grandfather   . Asthma Paternal Grandfather    Family Psychiatric  History: See H&P Social History:  Social History   Substance and Sexual Activity  Alcohol Use Yes  . Alcohol/week: 0.0 standard drinks     Social History   Substance and Sexual Activity  Drug Use Yes  . Types: Marijuana, Cocaine   Comment:                      Social History   Socioeconomic History  . Marital status: Single    Spouse name: Not on file  . Number of children: 2  .  Years of education: 12th grade  . Highest education level: 12th grade  Occupational History  . 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 Financial resource strain: Hard  . Food insecurity:    Worry: Sometimes true    Inability: Often true  . Transportation needs:    Medical: Yes    Non-medical: Yes  Tobacco Use  . Smoking status: Current Every Day Smoker    Packs/day: 0.50    Types: Cigarettes  . Smokeless tobacco: Never Used  Substance and Sexual Activity  . Alcohol use: Yes    Alcohol/week: 0.0 standard drinks  . Drug use: Yes    Types: Marijuana, Cocaine    Comment:                    . Sexual activity: Not Currently   Lifestyle  . Physical activity:    Days per week: 0 days    Minutes per session: 0 min  . Stress: Very much  Relationships  . Social connections:    Talks on phone: Once a week    Gets together: Never    Attends religious service: Never    Active member of club or organization: No    Attends meetings of clubs or organizations: Not on file    Relationship status: Divorced  Other Topics Concern  . Not on file  Social History Narrative  . Not on file   Additional Social History:                         Sleep: Fair  Appetite:  Good  Current Medications: Current Facility-Administered Medications  Medication Dose Route Frequency Provider Last Rate Last Dose  . acetaminophen (TYLENOL) tablet 650 mg  650 mg Oral Q6H PRN [name], NP      . albuterol (PROVENTIL HFA;VENTOLIN HFA) 108 (90 Base) MCG/ACT inhaler 1-2 puff  1-2 puff Inhalation Q4H PRN Donell Sievert E, PA-C   2 puff at 02/04/18 0818  . albuterol (PROVENTIL) (2.5 MG/3ML) 0.083% nebulizer solution 2.5 mg  2.5 mg Nebulization Q4H PRN Antonieta Pert, MD   2.5 mg at 02/04/18 1201  . alum & mag hydroxide-simeth (MAALOX/MYLANTA) 200-200-20 MG/5ML suspension 30 mL  30 mL Oral Q4H PRN [name], NP      . Melene Muller ON 02/05/2018] ARIPiprazole (ABILIFY) tablet 10 mg  10 mg Oral Daily Money, Gerlene Burdock, FNP      . fluticasone (FLOVENT HFA) 44 MCG/ACT inhaler 2 puff  2 puff Inhalation BID Antonieta Pert, MD   2 puff at 02/04/18 [phone]  . gabapentin (NEURONTIN) capsule 400 mg  400 mg Oral TID Antonieta Pert, MD   400 mg at 02/04/18 1135  . hydrOXYzine (ATARAX/VISTARIL) tablet 25 mg  25 mg Oral TID PRN Donell Sievert E, PA-C   25 mg at 02/04/18 1309  . hydrOXYzine (ATARAX/VISTARIL) tablet 50 mg  50 mg Oral QHS Money, Travis B, FNP      . magnesium hydroxide (MILK OF MAGNESIA) suspension 30 mL  30 mL Oral Daily PRN [name], NP      . mirtazapine (REMERON) tablet 30 mg  30 mg Oral QHS  Antonieta Pert, MD   30 mg at 02/03/18 2110  . multivitamin with minerals tablet 1 tablet  1 tablet Oral Daily Antonieta Pert, MD   1 tablet at 02/04/18 0813  . nicotine (NICODERM CQ - dosed in mg/24 hours)  patch 21 mg  21 mg Transdermal Daily Antonieta Pert, MD   21 mg at 02/04/18 1610  . pantoprazole (PROTONIX) EC tablet 40 mg  40 mg Oral Daily Kerry Hough, PA-C   40 mg at 02/04/18 0813  . predniSONE (STERAPRED UNI-PAK 21 TAB) tablet 5 mg  5 mg Oral 4X daily taper [name], NP   5 mg at 02/04/18 9604  . thiamine (VITAMIN B-1) tablet 100 mg  100 mg Oral Daily Antonieta Pert, MD   100 mg at 02/04/18 0813    Lab Results: No results found for this or any previous visit (from the past 48 hour(s)).  Blood Alcohol level:  Lab Results  Component Value Date   ETH 117 (H) 01/31/2018   ETH <10 06/28/2017    Metabolic Disorder Labs: No results found for: HGBA1C, MPG No results found for: PROLACTIN No results found for: CHOL, TRIG, HDL, CHOLHDL, VLDL, LDLCALC  Physical Findings: AIMS: Facial and Oral Movements Muscles of Facial Expression: None, normal Lips and Perioral Area: None, normal Jaw: None, normal Tongue: None, normal,Extremity Movements Upper (arms, wrists, hands, fingers): None, normal Lower (legs, knees, ankles, toes): None, normal, Trunk Movements Neck, shoulders, hips: None, normal, Overall Severity Severity of abnormal movements (highest score from questions above): None, normal Incapacitation due to abnormal movements: None, normal Patient's awareness of abnormal movements (rate only patient's report): No Awareness, Dental Status Current problems with teeth and/or dentures?: No Does patient usually wear dentures?: No  CIWA:  CIWA-Ar Total: 1 COWS:  COWS Total Score: 2  Musculoskeletal: Strength & Muscle Tone: within normal limits Gait & Station: normal Patient leans: N/A  Psychiatric Specialty Exam: Physical Exam  Nursing note and  vitals reviewed. Constitutional: He is oriented to person, place, and time. He appears well-developed and well-nourished.  Cardiovascular: Normal rate.  Respiratory: Effort normal.  Musculoskeletal: Normal range of motion.  Neurological: He is alert and oriented to person, place, and time.  Skin: Skin is warm.    Review of Systems  Constitutional: Negative.   HENT: Negative.   Eyes: Negative.   Respiratory: Negative.   Cardiovascular: Negative.   Gastrointestinal: Negative.   Genitourinary: Negative.   Musculoskeletal: Negative.   Skin: Negative.   Neurological: Negative.   Endo/Heme/Allergies: Negative.   Psychiatric/Behavioral: Positive for depression.    Blood pressure 109/76, pulse 71, temperature 97.9 F (36.6 C), temperature source Oral, resp. rate 16, height 6' (1.829 m), weight 83.9 kg, SpO2 100 %.Body mass index is 25.09 kg/m.  General Appearance: Casual  Eye Contact:  Good  Speech:  Clear and Coherent and Normal Rate  Volume:  Normal  Mood:  Depressed  Affect:  Congruent  Thought Process:  Goal Directed and Descriptions of Associations: Intact  Orientation:  Full (Time, Place, and Person)  Thought Content:  WDL  Suicidal Thoughts:  No  Homicidal Thoughts:  No  Memory:  Immediate;   Good Recent;   Good Remote;   Good  Judgement:  Fair  Insight:  Fair  Psychomotor Activity:  Normal  Concentration:  Concentration: Good and Attention Span: Good  Recall:  Good  Fund of Knowledge:  Good  Language:  Good  Akathisia:  No  Handed:  Right  AIMS (if indicated):     Assets:  Communication Skills Desire for Improvement Physical Health Social Support Transportation  ADL's:  Intact  Cognition:  WNL  Sleep:  Number of Hours: 6.5   Problems addressed Bipolar 2 disorder  Treatment Plan  Summary: Discussed with patient medications for sleep or potentially switching to Seroquel or another drug.  Patient states that he likes the Abilify was to continue it.  Patient  also reports that Seroquel in the past that caused him to have a ringing in his ears.  After discussing medications we will add on Vistaril 50 mg p.o. nightly to assist with his sleep and continue the Remeron at the current dose. Daily contact with patient to assess and evaluate symptoms and progress in treatment, Medication management and Plan is to: Increase Abilify to 10 mg p.o. daily for mood stability Continue Neurontin 400 mg p.o. 3 times daily for pain and mood stability Continue Remeron 30 mg p.o. nightly for mood stability CSW to assist with contact information for [facility] Encourage group therapy participation  Maryfrances Bunnell, FNP 02/04/2018, 1:17 PM   ..Agree with NP Progress Note

## 2018-02-04 NOTE — Progress Notes (Signed)
Recreation Therapy Notes  Date: 10.16.19 Time: 0930 Location: [address] Dayroom  Group Topic: Stress Management  Goal Area(s) Addresses:  Patient will verbalize importance of using healthy stress management.  Patient will identify positive emotions associated with healthy stress management.   Behavioral Response: Engaged  Intervention: Stress Management  Activity :  Meditation.  LRT introduced the stress management technique of meditation.  LRT played a meditation that dealt with resilience.  Patients were to listen and follow along as meditation played.  Education:  Stress Management, Discharge Planning.   Education Outcome: Acknowledges edcuation/In group clarification offered/Needs additional education  Clinical Observations/Feedback: Pt attended and participated in group session.    Devin [name], LRT/CTRS         [name] 02/04/2018 11:23 AM 

## 2018-02-04 NOTE — [facility] Group Notes (Signed)
Adult Psychoeducational Group Note  Date:  02/04/2018 Time:  9:20 AM  Group Topic/Focus:  Goals Group:   The focus of this group is to help patients establish daily goals to achieve during treatment and discuss how the patient can incorporate goal setting into their daily lives to aide in recovery.  Participation Level:  Active  Participation Quality:  Appropriate  Affect:  Appropriate  Cognitive:  Alert  Insight: Appropriate  Engagement in Group:  Engaged  Modes of Intervention:  Orientation  Additional Comments:    Pt attended and participated in orientation/goals group. Pt goal for today is to talk to doctor about PTSD.  Dellia Nims 02/04/2018, 9:20 AM

## 2018-02-04 NOTE — Patient Instructions (Signed)
Follow up with [facility] and to continue to work on getting his [employer].

## 2018-02-04 NOTE — Progress Notes (Signed)
Patient did attend the evening speaker NA meeting.  

## 2018-02-04 NOTE — Progress Notes (Signed)
D: Patient continues to report depressive symptoms.  He does not appear to be withdrawing and his protocol is complete.  Patient did have an upsetting phone call with his wife earlier.  He also stated during his EKG, "yeah, I relapsed and she may not stay with me and I understand.  We can remain friend.  We have a lot of kids between [place]." Patient is looking at a possible [facility].  He is talkative with bright affect.  A: Continue to monitor medication management and MD orders.  Safety checks completed every 15 minutes per protocol.  Offer support and encouragement as needed.  R: Patient is receptive to staff; his behavior is appropriate.

## 2018-02-05 MED ORDER — OLANZAPINE 5 MG PO TABS
5.0000 mg | ORAL_TABLET | Freq: Every day | ORAL | Status: DC
  Administered 2018-02-05: 5 mg via ORAL
  Filled 2018-02-05 (×3): qty 1

## 2018-02-05 NOTE — Progress Notes (Signed)
D: Patient has been needy and attention seeking.  He asks for his inhalers frequently stating, "I can't breathe well."  Patient has not asked for any breathing treatments today.  He has requested to speak to NP, after being told that he was seeing Dr. Jama Flavors today.  He is complaining of ongoing anxiety, especially when he talks on the phone with his wife and brother.  He knows of his discharge tomorrow and is not happy about it. Patient is not sure where he will go.  He has a list of [facility] and is supposed to be calling them per Child psychotherapist.  Social worker spoke with him at length about his discharge tomorrow.  He asked again to speak with his social worker this afternoon.  He denies any thoughts of self harm or withdrawal symptoms.  His CIWA has been discontinued.  A: Continue to monitor medication management and MD orders.  Safety checks completed every 15 minutes per protocol.  Offer support and encouragement as needed.  R: Patient is receptive to staff; his behavior is appropriate.

## 2018-02-05 NOTE — Progress Notes (Signed)
Nursing Progress Note: 7p-7a D: Pt currently presents with a anxious/pleasant affect and behavior. Pt states "I didn't sleep very good last night. I was struggling with Abilify it made me all stuttery when I talked. I'm taking Cymbalta instead now." Interacting appropriately with the milieu. Pt reports poor sleep during the previous night with current medication regimen. Pt did attend wrap-up group.  A: Pt provided with medications per providers orders. Pt's labs and vitals were monitored throughout the night. Pt supported emotionally and encouraged to express concerns and questions. Pt educated on medications.  R: Pt's safety ensured with 15 minute and environmental checks. Pt currently denies SI, HI, and AVH. Pt verbally contracts to seek staff if SI,HI, or AVH occurs and to consult with staff before acting on any harmful thoughts. Will continue to monitor.

## 2018-02-05 NOTE — Progress Notes (Signed)
[facility] MD Progress Note  02/05/2018 12:23 PM Jailon Schaible  MRN:  [id number]   Subjective: Patient reports partial improvement compared to how he felt on admission.  He describes lingering depression, sadness related to marital/relationship conflict.  Denies suicidal ideations today and presents future oriented, stating that he plans to make phone calls to [facility]. Denies medication side effects.  Objective: I have discussed case with treatment team and have met with patient.  37 year old male, presented with depression, suicidal thoughts of running into traffic, recent relapse on cannabis, cocaine.   At this time patient reports some improvement and acknowledges mood is better than it was on admission.  He states he remains saddened by relationship conflict but denies suicidal ideations and states he is motivated in "getting better".  Today reports his plan is to contact [employer]. He has been visible in day room, noted to be interactive/socializing with peers. No disruptive or agitated behaviors. Denies medication side effects. Denies suicidal ideations   Principal Problem: Bipolar II disorder ([place]) Diagnosis:   Patient Active Problem List   Diagnosis Date Noted  . Major depressive disorder, recurrent, severe without psychotic features ([place]) [F33.2] 01/31/2018  . Bipolar II disorder ([place]) [F31.81] 01/31/2018  . Cocaine abuse with cocaine-induced mood disorder ([place]) [F14.14] 03/22/2017  . Hypoxia [R09.02]   . COPD exacerbation ([place]) [J44.1]   . Leukocytosis [D72.829] 11/04/2015  . Transaminitis [R74.0] 07/17/2015  . Tooth ache [K08.89] 07/17/2015  . Asthma with status asthmaticus [J45.902] 10/14/2011  . ADHD (attention deficit hyperactivity disorder) [F90.9] 10/14/2011   Total Time spent with patient: 20 minutes  Past Psychiatric History: See H&P  Past Medical History:  Past Medical History:  Diagnosis Date  . ADHD (attention deficit hyperactivity disorder)   . Asthma    . GERD (gastroesophageal reflux disease)   . Transaminitis 07/17/2015    Past Surgical History:  Procedure Laterality Date  . TYMPANOPLASTY Left 1997   Family History:  Family History  Problem Relation Age of Onset  . Coronary artery disease Father   . Heart attack Father   . Heart disease Father   . Heart failure Mother   . Hyperlipidemia Mother   . Hypertension Mother   . Migraines Mother   . COPD Mother   . Diabetes Maternal Aunt   . Asthma Maternal Grandfather   . Asthma Paternal Grandfather    Family Psychiatric  History: See H&P Social History:  Social History   Substance and Sexual Activity  Alcohol Use Yes  . Alcohol/week: 0.0 standard drinks     Social History   Substance and Sexual Activity  Drug Use Yes  . Types: Marijuana, Cocaine   Comment:                      Social History   Socioeconomic History  . Marital status: Single    Spouse name: Not on file  . Number of children: 2  . Years of education: 12th grade  . Highest education level: 12th grade  Occupational History  . 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 Financial resource strain: Hard  . Food insecurity:    Worry: Sometimes true    Inability: Often true  . Transportation needs:    Medical: Yes    Non-medical: Yes  Tobacco Use  . Smoking status: Current Every Day Smoker    Packs/day: 0.50    Types: Cigarettes  . Smokeless tobacco: Never Used  Substance and Sexual Activity  .  Alcohol use: Yes    Alcohol/week: 0.0 standard drinks  . Drug use: Yes    Types: Marijuana, Cocaine    Comment:                    . Sexual activity: Not Currently  Lifestyle  . Physical activity:    Days per week: 0 days    Minutes per session: 0 min  . Stress: Very much  Relationships  . Social connections:    Talks on phone: Once a week    Gets together: Never    Attends religious service: Never    Active member of club or organization: No    Attends meetings of clubs or organizations:  Not on file    Relationship status: Divorced  Other Topics Concern  . Not on file  Social History Narrative  . Not on file   Additional Social History:   Sleep: improving mood   Appetite:  Good  Current Medications: Current Facility-Administered Medications  Medication Dose Route Frequency Provider Last Rate Last Dose  . acetaminophen (TYLENOL) tablet 650 mg  650 mg Oral Q6H PRN Ethelene Hal, NP      . albuterol (PROVENTIL HFA;VENTOLIN HFA) 108 (90 Base) MCG/ACT inhaler 1-2 puff  1-2 puff Inhalation Q4H PRN Patriciaann Clan E, PA-C   2 puff at 02/04/18 1558  . albuterol (PROVENTIL) (2.5 MG/3ML) 0.083% nebulizer solution 2.5 mg  2.5 mg Nebulization Q4H PRN Sharma Covert, MD   2.5 mg at 02/04/18 1201  . alum & mag hydroxide-simeth (MAALOX/MYLANTA) 200-200-20 MG/5ML suspension 30 mL  30 mL Oral Q4H PRN Ethelene Hal, NP      . ARIPiprazole (ABILIFY) tablet 10 mg  10 mg Oral Daily Money, Lowry Ram, FNP   10 mg at 02/05/18 0749  . fluticasone (FLOVENT HFA) 44 MCG/ACT inhaler 2 puff  2 puff Inhalation BID Sharma Covert, MD   2 puff at 02/05/18 0748  . gabapentin (NEURONTIN) capsule 400 mg  400 mg Oral TID Sharma Covert, MD   400 mg at 02/05/18 1211  . hydrOXYzine (ATARAX/VISTARIL) tablet 25 mg  25 mg Oral TID PRN Patriciaann Clan E, PA-C   25 mg at 02/05/18 0751  . hydrOXYzine (ATARAX/VISTARIL) tablet 50 mg  50 mg Oral QHS Money, Lowry Ram, FNP   50 mg at 02/04/18 2124  . magnesium hydroxide (MILK OF MAGNESIA) suspension 30 mL  30 mL Oral Daily PRN Ethelene Hal, NP      . mirtazapine (REMERON) tablet 30 mg  30 mg Oral QHS Sharma Covert, MD   30 mg at 02/04/18 2124  . multivitamin with minerals tablet 1 tablet  1 tablet Oral Daily Sharma Covert, MD   1 tablet at 02/05/18 0749  . nicotine (NICODERM CQ - dosed in mg/24 hours) patch 21 mg  21 mg Transdermal Daily Sharma Covert, MD   21 mg at 02/05/18 0747  . pantoprazole (PROTONIX) EC tablet 40 mg   40 mg Oral Daily Laverle Hobby, PA-C   40 mg at 02/05/18 0749  . thiamine (VITAMIN B-1) tablet 100 mg  100 mg Oral Daily Sharma Covert, MD   100 mg at 02/05/18 0086    Lab Results: No results found for this or any previous visit (from the past 48 hour(s)).  Blood Alcohol level:  Lab Results  Component Value Date   ETH 117 (H) 01/31/2018   ETH <10 76/19/5093    Metabolic  Disorder Labs: No results found for: HGBA1C, MPG No results found for: PROLACTIN No results found for: CHOL, TRIG, HDL, CHOLHDL, VLDL, LDLCALC  Physical Findings: AIMS: Facial and Oral Movements Muscles of Facial Expression: None, normal Lips and Perioral Area: None, normal Jaw: None, normal Tongue: None, normal,Extremity Movements Upper (arms, wrists, hands, fingers): None, normal Lower (legs, knees, ankles, toes): None, normal, Trunk Movements Neck, shoulders, hips: None, normal, Overall Severity Severity of abnormal movements (highest score from questions above): None, normal Incapacitation due to abnormal movements: None, normal Patient's awareness of abnormal movements (rate only patient's report): No Awareness, Dental Status Current problems with teeth and/or dentures?: No Does patient usually wear dentures?: No  CIWA:  CIWA-Ar Total: 0 COWS:  COWS Total Score: 2  Musculoskeletal: Strength & Muscle Tone: within normal limits Gait & Station: normal Patient leans: N/A  Psychiatric Specialty Exam: Physical Exam  Nursing note and vitals reviewed. Constitutional: He is oriented to person, place, and time. He appears well-developed and well-nourished.  Cardiovascular: Normal rate.  Respiratory: Effort normal.  Musculoskeletal: Normal range of motion.  Neurological: He is alert and oriented to person, place, and time.  Skin: Skin is warm.    Review of Systems  Constitutional: Negative.   HENT: Negative.   Eyes: Negative.   Respiratory: Negative.   Cardiovascular: Negative.    Gastrointestinal: Negative.   Genitourinary: Negative.   Musculoskeletal: Negative.   Skin: Negative.   Neurological: Negative.   Endo/Heme/Allergies: Negative.   Psychiatric/Behavioral: Positive for depression.  no chest pain, no shortness of breath, no vomiting   Blood pressure (!) 138/94, pulse 90, temperature 98.3 F (36.8 C), temperature source Oral, resp. rate 18, height 6' (1.829 m), weight 83.9 kg, SpO2 100 %.Body mass index is 25.09 kg/m.  General Appearance: improving grooming   Eye Contact:  Good  Speech:  Normal Rate  Volume:  Normal  Mood:  reports partial improvement, less depressed   Affect:  reactive, brightens during interactions, briefly tearful when talking about relationship losses   Thought Process:  Linear and Descriptions of Associations: Intact  Orientation:  Other:  fully alert and attentive  Thought Content:  no hallucinations, no delusions, not internally preoccupied   Suicidal Thoughts:  No denies suicidal or self injurious ideations, no homicidal or violent ideations, specifically denies any HI or violent ideations towards SO  Homicidal Thoughts:  No  Memory:  recent and remote grossly intact   Judgement:  Other:  improving  Insight:  Fair/ improving   Psychomotor Activity:  Normal- no restlessness, no psychomotor agitation  Concentration:  Concentration: Good and Attention Span: Good  Recall:  Good  Fund of Knowledge:  Good  Language:  Good  Akathisia:  No  Handed:  Right  AIMS (if indicated):     Assets:  Communication Skills Desire for Improvement Physical Health Social Support Transportation  ADL's:  Intact  Cognition:  WNL  Sleep:  Number of Hours: 6.25   Assessment -  37 year old male, presented with depression, suicidal thoughts of running into traffic, recent relapse on cannabis, cocaine.   Patient reports partially improved mood and presents with a more reactive affect, which brightens during interactions with staff/peers. Denies  suicidal ideations and presents future oriented, focusing more on discharge planning options, expressing interest in going to an [employer] at discharge. Denies medication side effects. Daily contact with patient to assess and evaluate symptoms and progress in treatment, Medication management and Plan is to:  Treatment Plan reviewed as below today 10/17  Encourage efforts to work on sobriety, relapse prevention Encourage group and milieu participation to work on Radiographer, therapeutic  Continue  Abilify  10 mg p.o. daily for mood disorder  Continue Neurontin 400 mg p.o. 3 times daily for anxiety, pain Continue Remeron 30 mg p.o. nightly for depression, anxiety, insomnia Treatment team working on disposition planning options- see above   Jenne Campus, MD 02/05/2018, 12:23 PM   Patient ID: Radene Knee, male   DOB: May 12, 1980, 37 y.o.   MRN: [id number]

## 2018-02-05 NOTE — Progress Notes (Addendum)
Patient approaches me in the hallway after he saw Dr. Jama Flavors earlier today.  He reports that he feels that his anxiety has increased.  It was discussed yesterday of whether the Abilify he was taking was causing him to have some anxiety and I informed him to notify me if he notices any increase in his anxiety after we increase the dose to 10 mg daily.  Due to his concern of the increased anxiety after the increased dose of Abilify, we will discontinue the Abilify.  And will start Zyprexa 5 mg p.o. nightly starting tonight.  Marland Kitchen.Agree with NP Progress Note

## 2018-02-05 NOTE — [facility] Group Notes (Signed)
[facility] Group Therapy 02/05/2018 1:15pm  Type of Therapy: [facility] Presentation  Participation Level: Active  Participation Quality: Attentive  Affect: Appropriate  Cognitive: Oriented  Insight: Developing/Improving  Engagement in Therapy: Engaged  Modes of Intervention: Discussion, Education and Socialization  Summary of Progress/Problems: Mental [facility] Association (MHA) Speaker came to talk about his personal journey with mental health. The pt processed ways by which to relate to the speaker. MHA speaker provided handouts and educational information pertaining to groups and services offered by the MHA. Pt was engaged in speaker's presentation and was receptive to resources provided.    Devin [name] S Devin Roes, LCSW 02/05/2018 10:02 AM  

## 2018-02-06 MED ORDER — OLANZAPINE 2.5 MG PO TABS
2.5000 mg | ORAL_TABLET | Freq: Every day | ORAL | Status: DC
  Administered 2018-02-06 – 2018-02-08 (×3): 2.5 mg via ORAL
  Filled 2018-02-06: qty 7
  Filled 2018-02-06 (×5): qty 1

## 2018-02-06 NOTE — Progress Notes (Signed)
Pt attended AA group this evening.  

## 2018-02-06 NOTE — Progress Notes (Signed)
[facility] MD Progress Note  02/06/2018 3:06 PM Gianlucas Evenson  MRN:  [id number]   Subjective: patient reports increased anxiety today, and presents ruminative.  States that he has been working on discharge options and is planning on going to [place] setting at discharge. Concerned because he does not have the money required by [facility] at this time.  States his brother has told him he will let them the money but will not be able to until tomorrow.  Denies medication side effects. Denies suicidal ideations at this time. Objective: I have discussed case with treatment team and have met with patient.  37 year old male, presented with depression, suicidal thoughts of running into traffic, recent relapse on cannabis, cocaine.   Patient presents with increased anxiety and ruminations regarding disposition planning (see above).  He is future oriented and states he plans to go to an [employer] but worries about this plan falling through, and states " I cannot be homeless, I know I will just relapse and go downhill again". In the context of discharge /disposition planning as above he reports increased anxiety, poor sleep, and reports intermittent passive SI, although currently denies suicidal ideations and presents future oriented/contracts for safety on unit. Tolerating medications well, on Neurontin, Remeron, Zyprexa .  Denies side effects ,states Neurontin has been particularly helpful to decrease anxiety (of note, Abilify was recently discontinued as patient felt that this medication may have been associated with increased anxiety). No disruptive or agitated behaviors on unit, noted by staff to have a more reactive and brighter affect during interactions with peers/in dayroom Currently denies suicidal plan or intention, presents future oriented and is contracting for safety on unit.   Principal Problem: Bipolar II disorder ([place]) Diagnosis:   Patient Active Problem List   Diagnosis Date Noted  .  Major depressive disorder, recurrent, severe without psychotic features ([place]) [F33.2] 01/31/2018  . Bipolar II disorder ([place]) [F31.81] 01/31/2018  . Cocaine abuse with cocaine-induced mood disorder ([place]) [F14.14] 03/22/2017  . Hypoxia [R09.02]   . COPD exacerbation ([place]) [J44.1]   . Leukocytosis [D72.829] 11/04/2015  . Transaminitis [R74.0] 07/17/2015  . Tooth ache [K08.89] 07/17/2015  . Asthma with status asthmaticus [J45.902] 10/14/2011  . ADHD (attention deficit hyperactivity disorder) [F90.9] 10/14/2011   Total Time spent with patient: 20 minutes  Past Psychiatric History: See H&P  Past Medical History:  Past Medical History:  Diagnosis Date  . ADHD (attention deficit hyperactivity disorder)   . Asthma   . GERD (gastroesophageal reflux disease)   . Transaminitis 07/17/2015    Past Surgical History:  Procedure Laterality Date  . TYMPANOPLASTY Left 1997   Family History:  Family History  Problem Relation Age of Onset  . Coronary artery disease Father   . Heart attack Father   . Heart disease Father   . Heart failure Mother   . Hyperlipidemia Mother   . Hypertension Mother   . Migraines Mother   . COPD Mother   . Diabetes Maternal Aunt   . Asthma Maternal Grandfather   . Asthma Paternal Grandfather    Family Psychiatric  History: See H&P Social History:  Social History   Substance and Sexual Activity  Alcohol Use Yes  . Alcohol/week: 0.0 standard drinks     Social History   Substance and Sexual Activity  Drug Use Yes  . Types: Marijuana, Cocaine   Comment:  Social History   Socioeconomic History  . Marital status: Single    Spouse name: Not on file  . Number of children: 2  . Years of education: 12th grade  . Highest education level: 12th grade  Occupational History  . 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 Financial resource strain: Hard  . Food insecurity:    Worry: Sometimes true    Inability: Often true  .  Transportation needs:    Medical: Yes    Non-medical: Yes  Tobacco Use  . Smoking status: Current Every Day Smoker    Packs/day: 0.50    Types: Cigarettes  . Smokeless tobacco: Never Used  Substance and Sexual Activity  . Alcohol use: Yes    Alcohol/week: 0.0 standard drinks  . Drug use: Yes    Types: Marijuana, Cocaine    Comment:                    . Sexual activity: Not Currently  Lifestyle  . Physical activity:    Days per week: 0 days    Minutes per session: 0 min  . Stress: Very much  Relationships  . Social connections:    Talks on phone: Once a week    Gets together: Never    Attends religious service: Never    Active member of club or organization: No    Attends meetings of clubs or organizations: Not on file    Relationship status: Divorced  Other Topics Concern  . Not on file  Social History Narrative  . Not on file   Additional Social History:   Sleep: Fair  Appetite:  Good  Current Medications: Current Facility-Administered Medications  Medication Dose Route Frequency Provider Last Rate Last Dose  . acetaminophen (TYLENOL) tablet 650 mg  650 mg Oral Q6H PRN Ethelene Hal, NP      . albuterol (PROVENTIL HFA;VENTOLIN HFA) 108 (90 Base) MCG/ACT inhaler 1-2 puff  1-2 puff Inhalation Q4H PRN Patriciaann Clan E, PA-C   2 puff at 02/06/18 1354  . albuterol (PROVENTIL) (2.5 MG/3ML) 0.083% nebulizer solution 2.5 mg  2.5 mg Nebulization Q4H PRN Sharma Covert, MD   2.5 mg at 02/04/18 1201  . alum & mag hydroxide-simeth (MAALOX/MYLANTA) 200-200-20 MG/5ML suspension 30 mL  30 mL Oral Q4H PRN Ethelene Hal, NP      . fluticasone (FLOVENT HFA) 44 MCG/ACT inhaler 2 puff  2 puff Inhalation BID Sharma Covert, MD   2 puff at 02/06/18 0754  . gabapentin (NEURONTIN) capsule 400 mg  400 mg Oral TID Sharma Covert, MD   400 mg at 02/06/18 1218  . hydrOXYzine (ATARAX/VISTARIL) tablet 25 mg  25 mg Oral TID PRN Patriciaann Clan E, PA-C   25 mg at 02/06/18  1004  . hydrOXYzine (ATARAX/VISTARIL) tablet 50 mg  50 mg Oral QHS Money, Lowry Ram, FNP   50 mg at 02/05/18 2114  . magnesium hydroxide (MILK OF MAGNESIA) suspension 30 mL  30 mL Oral Daily PRN Ethelene Hal, NP      . mirtazapine (REMERON) tablet 30 mg  30 mg Oral QHS Sharma Covert, MD   30 mg at 02/05/18 2114  . multivitamin with minerals tablet 1 tablet  1 tablet Oral Daily Sharma Covert, MD   1 tablet at 02/06/18 0754  . nicotine (NICODERM CQ - dosed in mg/24 hours) patch 21 mg  21 mg Transdermal Daily Mallie Darting Cordie Grice, MD   21  mg at 02/06/18 0753  . OLANZapine (ZYPREXA) tablet 5 mg  5 mg Oral QHS Money, Lowry Ram, FNP   5 mg at 02/05/18 2114  . pantoprazole (PROTONIX) EC tablet 40 mg  40 mg Oral Daily Laverle Hobby, PA-C   40 mg at 02/06/18 0753  . thiamine (VITAMIN B-1) tablet 100 mg  100 mg Oral Daily Sharma Covert, MD   100 mg at 02/06/18 1031    Lab Results: No results found for this or any previous visit (from the past 48 hour(s)).  Blood Alcohol level:  Lab Results  Component Value Date   ETH 117 (H) 01/31/2018   ETH <10 59/45/8592    Metabolic Disorder Labs: No results found for: HGBA1C, MPG No results found for: PROLACTIN No results found for: CHOL, TRIG, HDL, CHOLHDL, VLDL, LDLCALC  Physical Findings: AIMS: Facial and Oral Movements Muscles of Facial Expression: None, normal Lips and Perioral Area: None, normal Jaw: None, normal Tongue: None, normal,Extremity Movements Upper (arms, wrists, hands, fingers): None, normal Lower (legs, knees, ankles, toes): None, normal, Trunk Movements Neck, shoulders, hips: None, normal, Overall Severity Severity of abnormal movements (highest score from questions above): None, normal Incapacitation due to abnormal movements: None, normal Patient's awareness of abnormal movements (rate only patient's report): No Awareness, Dental Status Current problems with teeth and/or dentures?: No Does patient usually  wear dentures?: No  CIWA:  CIWA-Ar Total: 1 COWS:  COWS Total Score: 2  Musculoskeletal: Strength & Muscle Tone: within normal limits Gait & Station: normal Patient leans: N/A  Psychiatric Specialty Exam: Physical Exam  Nursing note and vitals reviewed. Constitutional: He is oriented to person, place, and time. He appears well-developed and well-nourished.  Cardiovascular: Normal rate.  Respiratory: Effort normal.  Musculoskeletal: Normal range of motion.  Neurological: He is alert and oriented to person, place, and time.  Skin: Skin is warm.    Review of Systems  Constitutional: Negative.   HENT: Negative.   Eyes: Negative.   Respiratory: Negative.   Cardiovascular: Negative.   Gastrointestinal: Negative.   Genitourinary: Negative.   Musculoskeletal: Negative.   Skin: Negative.   Neurological: Negative.   Endo/Heme/Allergies: Negative.   Psychiatric/Behavioral: Positive for depression.  no headache, no chest pain, no shortness of breath, no vomiting   Blood pressure (!) 137/93, pulse 97, temperature 98.5 F (36.9 C), resp. rate 18, height 6' (1.829 m), weight 83.9 kg, SpO2 100 %.Body mass index is 25.09 kg/m.  General Appearance: improving grooming   Eye Contact:  Good  Speech:  Normal Rate  Volume:  Normal  Mood:  Reports improvement compared to admission but describes increased anxiety today  Affect:  Anxious, more ruminative, improves during session, reactive  Thought Process:  Linear and Descriptions of Associations: Intact  Orientation:  Other:  fully alert and attentive  Thought Content:  no hallucinations, no delusions, not internally preoccupied   Suicidal Thoughts:  No reports recent fleeting passive SI but currently denies suicidal ideations, no plans, no intention, future oriented, contracts for safety on unit  Homicidal Thoughts:  No denies homicidal ideations  Memory:  recent and remote grossly intact   Judgement:  Other:  improving  Insight:  Fair/  improving   Psychomotor Activity:  Normal- no restlessness, no psychomotor agitation  Concentration:  Concentration: Good and Attention Span: Good  Recall:  Good  Fund of Knowledge:  Good  Language:  Good  Akathisia:  No  Handed:  Right  AIMS (if indicated):  Assets:  Communication Skills Desire for Improvement Physical Health Social Support Transportation  ADL's:  Intact  Cognition:  WNL  Sleep:  Number of Hours: 6.25   Assessment -  37 year old male, presented with depression, suicidal thoughts of running into traffic, recent relapse on cannabis, cocaine.   Patient presenting with improvement compared to admission presentation, today more anxious and ruminative concerning disposition/discharge planning.  He plans to go to a [place] setting but ruminates that he will not be able to until he is able to pay an initial fee which he states his brother will lend him but not until tomorrow.  He is expressing significant anxiety and worsened mood today based on these concerns, does respond to support, encouragement, affect tends to improve during session.  Denies suicidal ideations at this time. Daily contact with patient to assess and evaluate symptoms and progress in treatment, Medication management and Plan is to:  Treatment Plan reviewed as below today 10/18 Encourage efforts to work on sobriety, relapse prevention Encourage group and milieu participation to work on Radiographer, therapeutic  Continue Zyprexa at 2.5 mg nightly for mood disorder/augmentation Continue Neurontin 400 mg p.o. 3 times daily for anxiety, pain Continue Remeron 30 mg p.o. nightly for depression, anxiety, insomnia Treatment team working on disposition planning options- see above   Jenne Campus, MD 02/06/2018, 3:06 PM   Patient ID: Radene Knee, male   DOB: [birth year], 37 y.o.   MRN: [id number]

## 2018-02-06 NOTE — Progress Notes (Signed)
  [facility] Adult Case Management Discharge Plan :  Will you be returning to the same living situation after discharge:  No.Pt plans [facility] attend [facility].  At discharge, do you have transportation home?: Yes,  brother. pt scheduled for Saturday discharge 10/19 Do you have the ability to pay for your medications: Yes,  mental health  Release of information consent forms completed and submitted to medical records by CSW.   Patient to Follow up at: Follow-up Information    [facility]. Go on 02/11/2018.   Specialty:  Behavioral Health Why:  Appointment for medication management and therapy services is Wednesday, 02/11/18 at 8:00am. Please be sure to bring your Photo ID, any insurance information and any dicharge paperwork including your list of medications.  Contact information: [address] [place] [address] [phone]           Next level of care provider has access to [facility] Link:no  Safety Planning and Suicide Prevention discussed: Yes,  SPE completed with pt and his girlfriend. SPI pamphlet and Mobile Crisis information provided.   Have you used any form of tobacco in the last 30 days? (Cigarettes, Smokeless Tobacco, Cigars, and/or Pipes): Yes  Has patient been referred to the Quitline?: Patient refused referral  Patient has been referred for addiction treatment: Yes  Rona Ravens, LCSW 02/06/2018, 1:54 PM

## 2018-02-06 NOTE — Tx Team (Signed)
Interdisciplinary Treatment and Diagnostic Plan Update  02/06/2018 Time of Session: 0830AM Devin [name] MRN: [id number]  Principal Diagnosis: Bipolar II disorder [facility])  Secondary Diagnoses: Principal Problem:   Bipolar II disorder ([place])   Current Medications:  Current Facility-Administered Medications  Medication Dose Route Frequency Provider Last Rate Last Dose  . acetaminophen (TYLENOL) tablet 650 mg  650 mg Oral Q6H PRN Ethelene Hal, NP      . albuterol (PROVENTIL HFA;VENTOLIN HFA) 108 (90 Base) MCG/ACT inhaler 1-2 puff  1-2 puff Inhalation Q4H PRN Patriciaann Clan E, PA-C   2 puff at 02/06/18 0757  . albuterol (PROVENTIL) (2.5 MG/3ML) 0.083% nebulizer solution 2.5 mg  2.5 mg Nebulization Q4H PRN Sharma Covert, MD   2.5 mg at 02/04/18 1201  . alum & mag hydroxide-simeth (MAALOX/MYLANTA) 200-200-20 MG/5ML suspension 30 mL  30 mL Oral Q4H PRN Ethelene Hal, NP      . fluticasone (FLOVENT HFA) 44 MCG/ACT inhaler 2 puff  2 puff Inhalation BID Sharma Covert, MD   2 puff at 02/06/18 0754  . gabapentin (NEURONTIN) capsule 400 mg  400 mg Oral TID Sharma Covert, MD   400 mg at 02/06/18 0753  . hydrOXYzine (ATARAX/VISTARIL) tablet 25 mg  25 mg Oral TID PRN Patriciaann Clan E, PA-C   25 mg at 02/05/18 1436  . hydrOXYzine (ATARAX/VISTARIL) tablet 50 mg  50 mg Oral QHS Money, Lowry Ram, FNP   50 mg at 02/05/18 2114  . magnesium hydroxide (MILK OF MAGNESIA) suspension 30 mL  30 mL Oral Daily PRN Ethelene Hal, NP      . mirtazapine (REMERON) tablet 30 mg  30 mg Oral QHS Sharma Covert, MD   30 mg at 02/05/18 2114  . multivitamin with minerals tablet 1 tablet  1 tablet Oral Daily Sharma Covert, MD   1 tablet at 02/06/18 0754  . nicotine (NICODERM CQ - dosed in mg/24 hours) patch 21 mg  21 mg Transdermal Daily Sharma Covert, MD   21 mg at 02/06/18 0753  . OLANZapine (ZYPREXA) tablet 5 mg  5 mg Oral QHS Money, Lowry Ram, FNP   5 mg at 02/05/18 2114  .  pantoprazole (PROTONIX) EC tablet 40 mg  40 mg Oral Daily Laverle Hobby, PA-C   40 mg at 02/06/18 0753  . thiamine (VITAMIN B-1) tablet 100 mg  100 mg Oral Daily Sharma Covert, MD   100 mg at 02/06/18 5643   PTA Medications: Medications Prior to Admission  Medication Sig Dispense Refill Last Dose  . albuterol (PROVENTIL HFA;VENTOLIN HFA) 108 (90 Base) MCG/ACT inhaler Inhale 1-2 puffs into the lungs every 6 (six) hours as needed for wheezing or shortness of breath. 1 Inhaler 1 01/31/2018 at Unknown time  . albuterol (PROVENTIL HFA;VENTOLIN HFA) 108 (90 Base) MCG/ACT inhaler Inhale 1-2 puffs into the lungs every 6 (six) hours as needed for wheezing or shortness of breath. 1 Inhaler 1 01/31/2018 at Unknown time  . benzonatate (TESSALON) 100 MG capsule Take 2 capsules (200 mg total) by mouth every 8 (eight) hours. 30 capsule 0 01/31/2018 at Unknown time  . fluticasone (FLONASE) 50 MCG/ACT nasal spray Place 1 spray into both nostrils daily. 16 g 2 01/31/2018 at Unknown time  . ibuprofen (ADVIL,MOTRIN) 200 MG tablet Take 600 mg by mouth every 6 (six) hours as needed for moderate pain.    01/31/2018 at Unknown time  . naproxen (NAPROSYN) 500 MG tablet Take 1 tablet (500 mg  total) by mouth 2 (two) times daily. 30 tablet 0 01/31/2018 at Unknown time  . ondansetron (ZOFRAN ODT) 4 MG disintegrating tablet Take 1 tablet (4 mg total) by mouth every 8 (eight) hours as needed for nausea or vomiting. 3 tablet 0 01/31/2018 at Unknown time    Patient Stressors: Educational concerns Financial difficulties Marital or family conflict  Patient Strengths: Ability for insight Active sense of humor Average or above average intelligence Capable of independent living Warehouse manager means  Treatment Modalities: Medication Management, Group therapy, Case management,  1 to 1 session with clinician, Psychoeducation, Recreational therapy.   Physician Treatment Plan for Primary Diagnosis:  Bipolar II disorder ([place]) Long Term Goal(s): Improvement in symptoms so as ready for discharge Improvement in symptoms so as ready for discharge   Short Term Goals: Ability to identify changes in lifestyle to reduce recurrence of condition will improve Ability to verbalize feelings will improve Ability to disclose and discuss suicidal ideas Ability to demonstrate self-control will improve Ability to identify and develop effective coping behaviors will improve Ability to maintain clinical measurements within normal limits will improve Compliance with prescribed medications will improve Ability to identify triggers associated with substance abuse/mental health issues will improve  Medication Management: Evaluate patient's response, side effects, and tolerance of medication regimen.  Therapeutic Interventions: 1 to 1 sessions, Unit Group sessions and Medication administration.  Evaluation of Outcomes: Progressing  Physician Treatment Plan for Secondary Diagnosis: Principal Problem:   Bipolar II disorder ([place])  Long Term Goal(s): Improvement in symptoms so as ready for discharge Improvement in symptoms so as ready for discharge   Short Term Goals: Ability to identify changes in lifestyle to reduce recurrence of condition will improve Ability to verbalize feelings will improve Ability to disclose and discuss suicidal ideas Ability to demonstrate self-control will improve Ability to identify and develop effective coping behaviors will improve Ability to maintain clinical measurements within normal limits will improve Compliance with prescribed medications will improve Ability to identify triggers associated with substance abuse/mental health issues will improve     Medication Management: Evaluate patient's response, side effects, and tolerance of medication regimen.  Therapeutic Interventions: 1 to 1 sessions, Unit Group sessions and Medication administration.  Evaluation of Outcomes:  Progressing  RN Treatment Plan for Primary Diagnosis: Bipolar II disorder ([place]) Long Term Goal(s): Knowledge of disease and therapeutic regimen to maintain health will improve  Short Term Goals: Ability to remain free from injury will improve, Ability to verbalize frustration and anger appropriately will improve, Ability to disclose and discuss suicidal ideas and Ability to identify and develop effective coping behaviors will improve  Medication Management: RN will administer medications as ordered by provider, will assess and evaluate patient's response and provide education to patient for prescribed medication. RN will report any adverse and/or side effects to prescribing provider.  Therapeutic Interventions: 1 on 1 counseling sessions, Psychoeducation, Medication administration, Evaluate responses to treatment, Monitor vital signs and CBGs as ordered, Perform/monitor CIWA, COWS, AIMS and Fall Risk screenings as ordered, Perform wound care treatments as ordered.  Evaluation of Outcomes: Progressing  LCSW Treatment Plan for Primary Diagnosis: Bipolar II disorder ([place]) Long Term Goal(s): Safe transition to appropriate next level of care at discharge, Engage patient in therapeutic group addressing interpersonal concerns.  Short Term Goals: Engage patient in aftercare planning with referrals and resources, Facilitate acceptance of mental health diagnosis and concerns and Identify triggers associated with mental health/substance abuse issues  Therapeutic Interventions: Assess for all discharge needs, 1  to 1 time with Education officer, museum, Explore available resources and support systems, Assess for adequacy in community support network, Educate family and significant other(s) on suicide prevention, Complete Psychosocial Assessment, Interpersonal group therapy.  Evaluation of Outcomes: Progressing  Progress in Treatment: Attending groups: Yes Participating in groups: Yes Taking medication as prescribed:  Yes. Toleration medication: Yes. Family/Significant other contact made: SPE completed with pt's wife.  Patient understands diagnosis: Yes. Discussing patient identified problems/goals with staff: Yes. Medical problems stabilized or resolved: Yes. Denies suicidal/homicidal ideation: Yes. Issues/concerns per patient self-inventory: No. Other: n/a   New problem(s) identified: No, Describe:  n/a  New Short Term/Long Term Goal(s): detox, medication management for mood stabilization; elimination of SI thoughts; development of comprehensive mental wellness/sobriety plan.   Patient Goals:  "to learn how to cope with life stress better."   Discharge Plan or Barriers: Pt has been referred to [facility] and met with Clinical biochemist of this halfway house. Pt also given [facility] list and encouraged to call. Pt declining residential treatment and has follow-up [facility] appt scheduled. [place] pamphlet, Mobile Crisis information, and AA/NA information provided to patient for additional community support and resources.   Reason for Continuation of Hospitalization: Depression Medication stabilization Anxiety  Estimated Length of Stay: Saturday, 02/07/18  Attendees: Patient: 02/06/2018 9:36 AM  Physician: Dr. Nancy Fetter MD; Dr. Parke Poisson MD 02/06/2018 9:36 AM  Nursing: Demaris Callander; [place] RN 02/06/2018 9:36 AM  RN Care Manager:x 02/06/2018 9:36 AM  Social Worker: Janice Norrie LCSW 02/06/2018 9:36 AM  Recreational Therapist: x 02/06/2018 9:36 AM  Other: Lindell Spar NP 02/06/2018 9:36 AM  Other:  02/06/2018 9:36 AM  Other: 02/06/2018 9:36 AM    Scribe for Treatment Team: Avelina Laine, LCSW 02/06/2018 9:36 AM

## 2018-02-06 NOTE — Progress Notes (Signed)
D: Patient is upset because of his pending discharge today.  A rep from [facility] came to talk to him, and he remains resistant to discharge.  Patient states, "I can't afford to stay there.  My brother can't help me.  My wife can't help me."  Patient is tearful and states that he slept poorly last night.  He states, "whatever medication they put me on gave me nightmares.  I kept seeing my dad die."  He wrote on his self inventory that he was feeling suicidal when he has denied it the past couple of days.  He rates everything "poor."  He rates his depression, anxiety and hopelessness as a 10.  Patient was supposed to be calling [employer] this week, which he has failed to do.  Patient also has been observed in the day room laughing and joking with his peers.  When he sees the MD or nursing staff, his affect changes. Patient is stuttering this morning stating, "the medication is making me do this."   A: Continue to monitor medication management and MD orders.  Safety checks completed every 15 minutes per protocol.  Offer support and encouragement as needed.  R: Patient is receptive to staff; patient remains tearful and anxious.  He was given a vistaril for same.

## 2018-02-06 NOTE — Progress Notes (Signed)
Recreation Therapy Notes  Date: 10.18.19 Time: 0930 Location: [address] Dayroom  Group Topic: Stress Management  Goal Area(s) Addresses:  Patient will verbalize importance of using healthy stress management.  Patient will identify positive emotions associated with healthy stress management.   Behavioral Response: Engaged  Intervention: Stress Management  Activity : Progressive Muscle Relaxation.  LRT introduced the stress management technique of progressive muscle relaxation.  LRT read [name] script to guide patients in tensing each muscle group individually then relaxing them. Patients were to follow along as the script was read.  Education:  Stress Management, Discharge Planning.   Education Outcome: Acknowledges edcuation/In group clarification offered/Needs additional education  Clinical Observations/Feedback: Pt attended and participated in group.    Devin [name], LRT/CTRS         Devin [name], Devin [name] 02/06/2018 11:10 AM

## 2018-02-06 NOTE — [facility] Group Notes (Signed)
LCSW Group Therapy Note  02/06/2018 1:15pm  Type of Therapy and Topic:  Group Therapy:  Feelings around Relapse and Recovery  Participation Level:  Active   Description of Group:    Patients in this group will discuss emotions they experience before and after a relapse. They will process how experiencing these feelings, or avoidance of experiencing them, relates to having a relapse. Facilitator will guide patients to explore emotions they have related to recovery. Patients will be encouraged to process which emotions are more powerful. They will be guided to discuss the emotional reaction significant others in their lives may have to their relapse or recovery. Patients will be assisted in exploring ways to respond to the emotions of others without this contributing to a relapse.  Therapeutic Goals: 1. Patient will identify two or more emotions that lead to a relapse for them 2. Patient will identify two emotions that result when they relapse 3. Patient will identify two emotions related to recovery 4. Patient will demonstrate ability to communicate their needs through discussion and/or role plays   Summary of Patient Progress:  [place] was attentive and engaged during today's processing group. He shared that through relapse, he has damaged his family relationships but is hoping to gain back trust by attending treatment through [facility] and AA, getting a sponsor, and entering an [facility] or halfway house at discharge. Raygen continues to demonstrate progress in the group setting with improving insight.   Therapeutic Modalities:   Cognitive Behavioral Therapy Solution-Focused Therapy Assertiveness Training Relapse Prevention Therapy   Rona Ravens, LCSW 02/06/2018 1:49 PM

## 2018-02-07 MED ORDER — MELATONIN 3 MG PO TABS
3.0000 mg | ORAL_TABLET | Freq: Every day | ORAL | Status: DC
  Administered 2018-02-07 – 2018-02-08 (×2): 3 mg via ORAL
  Filled 2018-02-07: qty 1
  Filled 2018-02-07: qty 7
  Filled 2018-02-07 (×2): qty 1

## 2018-02-07 NOTE — Progress Notes (Addendum)
[facility] MD Progress Note  02/07/2018 1:14 PM Devin [name]  MRN:  [id number]   Subjective: Patient reports that his Zyprexa dose was decreased because he was having some severe nightmares since he started it and after decreasing it to 2.5 mg he feels that that has improved.  However, he reports that he was having a lot of difficulty with sleep last night.  He is asking for something else to be added to assist him with his sleep.  He reports that he cannot take trazodone because of the side effects.  He reports he is thought about using melatonin but is never use it before, but he has given it to his children due to them being diagnosed with ADHD and needing assistance with sleep.  Patient denies any suicidal or homicidal ideations and states that his plan is to discharge on Monday to go to either [facility] or he will go to an [facility].  If he is declined at both of those then he states that he plans to go to his brother's house.  Objective: Patient's chart and findings reviewed and discussed with treatment team.  Patient presents in the day room and has been seen interacting with peers and staff appropriately.  Patient is pleasant, calm, and cooperative.  After discussing issues with sleep have decided to start melatonin 3 mg p.o. nightly.  And we will continue all his medications as prescribed.  Patient has been seen attending groups and there have been no complaints about the patient on the unit.  Principal Problem: Bipolar II disorder (HCC) Diagnosis:   Patient Active Problem List   Diagnosis Date Noted  . Major depressive disorder, recurrent, severe without psychotic features (HCC) [F33.2] 01/31/2018  . Bipolar II disorder (HCC) [F31.81] 01/31/2018  . Cocaine abuse with cocaine-induced mood disorder (HCC) [F14.14] 03/22/2017  . Hypoxia [R09.02]   . COPD exacerbation (HCC) [J44.1]   . Leukocytosis [D72.829] 11/04/2015  . Transaminitis [R74.0] 07/17/2015  . Tooth ache [K08.89] 07/17/2015   . Asthma with status asthmaticus [J45.902] 10/14/2011  . ADHD (attention deficit hyperactivity disorder) [F90.9] 10/14/2011   Total Time spent with patient: 30 minutes  Past Psychiatric History: See H&P  Past Medical History:  Past Medical History:  Diagnosis Date  . ADHD (attention deficit hyperactivity disorder)   . Asthma   . GERD (gastroesophageal reflux disease)   . Transaminitis 07/17/2015    Past Surgical History:  Procedure Laterality Date  . TYMPANOPLASTY Left 1997   Family History:  Family History  Problem Relation Age of Onset  . Coronary artery disease Father   . Heart attack Father   . Heart disease Father   . Heart failure Mother   . Hyperlipidemia Mother   . Hypertension Mother   . Migraines Mother   . COPD Mother   . Diabetes Maternal Aunt   . Asthma Maternal Grandfather   . Asthma Paternal Grandfather    Family Psychiatric  History: See H&P Social History:  Social History   Substance and Sexual Activity  Alcohol Use Yes  . Alcohol/week: 0.0 standard drinks     Social History   Substance and Sexual Activity  Drug Use Yes  . Types: Marijuana, Cocaine   Comment:                      Social History   Socioeconomic History  . Marital status: Single    Spouse name: Not on file  . Number of children: 2  .  Years of education: 12th grade  . Highest education level: 12th grade  Occupational History  . 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 Financial resource strain: Hard  . Food insecurity:    Worry: Sometimes true    Inability: Often true  . Transportation needs:    Medical: Yes    Non-medical: Yes  Tobacco Use  . Smoking status: Current Every Day Smoker    Packs/day: 0.50    Types: Cigarettes  . Smokeless tobacco: Never Used  Substance and Sexual Activity  . Alcohol use: Yes    Alcohol/week: 0.0 standard drinks  . Drug use: Yes    Types: Marijuana, Cocaine    Comment:                    . Sexual activity: Not Currently   Lifestyle  . Physical activity:    Days per week: 0 days    Minutes per session: 0 min  . Stress: Very much  Relationships  . Social connections:    Talks on phone: Once a week    Gets together: Never    Attends religious service: Never    Active member of club or organization: No    Attends meetings of clubs or organizations: Not on file    Relationship status: Divorced  Other Topics Concern  . Not on file  Social History Narrative  . Not on file   Additional Social History:                         Sleep: Fair  Appetite:  Good  Current Medications: Current Facility-Administered Medications  Medication Dose Route Frequency Provider Last Rate Last Dose  . acetaminophen (TYLENOL) tablet 650 mg  650 mg Oral Q6H PRN [name], NP      . albuterol (PROVENTIL HFA;VENTOLIN HFA) 108 (90 Base) MCG/ACT inhaler 1-2 puff  1-2 puff Inhalation Q4H PRN Donell Sievert E, PA-C   2 puff at 02/07/18 1301  . albuterol (PROVENTIL) (2.5 MG/3ML) 0.083% nebulizer solution 2.5 mg  2.5 mg Nebulization Q4H PRN Antonieta Pert, MD   2.5 mg at 02/04/18 1201  . alum & mag hydroxide-simeth (MAALOX/MYLANTA) 200-200-20 MG/5ML suspension 30 mL  30 mL Oral Q4H PRN [name], NP      . fluticasone (FLOVENT HFA) 44 MCG/ACT inhaler 2 puff  2 puff Inhalation BID Antonieta Pert, MD   2 puff at 02/07/18 [phone]  . gabapentin (NEURONTIN) capsule 400 mg  400 mg Oral TID Antonieta Pert, MD   400 mg at 02/07/18 1159  . hydrOXYzine (ATARAX/VISTARIL) tablet 25 mg  25 mg Oral TID PRN Donell Sievert E, PA-C   25 mg at 02/07/18 9604  . hydrOXYzine (ATARAX/VISTARIL) tablet 50 mg  50 mg Oral QHS Money, Gerlene Burdock, FNP   50 mg at 02/06/18 2215  . magnesium hydroxide (MILK OF MAGNESIA) suspension 30 mL  30 mL Oral Daily PRN [name], NP      . Melatonin TABS 3 mg  3 mg Oral QHS Money, Travis B, FNP      . mirtazapine (REMERON) tablet 30 mg  30 mg Oral QHS Antonieta Pert, MD    30 mg at 02/06/18 2215  . multivitamin with minerals tablet 1 tablet  1 tablet Oral Daily Antonieta Pert, MD   1 tablet at 02/07/18 0823  . nicotine (NICODERM CQ - dosed in mg/24 hours) patch  21 mg  21 mg Transdermal Daily Antonieta Pert, MD   21 mg at 02/07/18 0849  . OLANZapine (ZYPREXA) tablet 2.5 mg  2.5 mg Oral QHS [place] Pelto, Rockey Situ, MD   2.5 mg at 02/06/18 2215  . pantoprazole (PROTONIX) EC tablet 40 mg  40 mg Oral Daily Kerry Hough, PA-C   40 mg at 02/07/18 1610  . thiamine (VITAMIN B-1) tablet 100 mg  100 mg Oral Daily Antonieta Pert, MD   100 mg at 02/07/18 9604    Lab Results: No results found for this or any previous visit (from the past 48 hour(s)).  Blood Alcohol level:  Lab Results  Component Value Date   ETH 117 (H) 01/31/2018   ETH <10 06/28/2017    Metabolic Disorder Labs: No results found for: HGBA1C, MPG No results found for: PROLACTIN No results found for: CHOL, TRIG, HDL, CHOLHDL, VLDL, LDLCALC  Physical Findings: AIMS: Facial and Oral Movements Muscles of Facial Expression: None, normal Lips and Perioral Area: None, normal Jaw: None, normal Tongue: None, normal,Extremity Movements Upper (arms, wrists, hands, fingers): None, normal Lower (legs, knees, ankles, toes): None, normal, Trunk Movements Neck, shoulders, hips: None, normal, Overall Severity Severity of abnormal movements (highest score from questions above): None, normal Incapacitation due to abnormal movements: None, normal Patient's awareness of abnormal movements (rate only patient's report): No Awareness, Dental Status Current problems with teeth and/or dentures?: No Does patient usually wear dentures?: No  CIWA:  CIWA-Ar Total: 1 COWS:  COWS Total Score: 2  Musculoskeletal: Strength & Muscle Tone: within normal limits Gait & Station: normal Patient leans: N/A  Psychiatric Specialty Exam: Physical Exam  Nursing note and vitals reviewed. Constitutional: He is oriented  to person, place, and time. He appears well-developed and well-nourished.  Cardiovascular: Normal rate.  Respiratory: Effort normal.  Musculoskeletal: Normal range of motion.  Neurological: He is alert and oriented to person, place, and time.  Skin: Skin is warm.    Review of Systems  Constitutional: Negative.   HENT: Negative.   Eyes: Negative.   Respiratory: Negative.   Cardiovascular: Negative.   Gastrointestinal: Negative.   Genitourinary: Negative.   Musculoskeletal: Negative.   Skin: Negative.   Neurological: Negative.   Endo/Heme/Allergies: Negative.   Psychiatric/Behavioral: Positive for depression. Negative for hallucinations and suicidal ideas.    Blood pressure 119/88, pulse 81, temperature 97.9 F (36.6 C), temperature source Oral, resp. rate 18, height 6' (1.829 m), weight 83.9 kg, SpO2 100 %.Body mass index is 25.09 kg/m.  General Appearance: Casual  Eye Contact:  Good  Speech:  Clear and Coherent and Normal Rate  Volume:  Normal  Mood:  Euthymic  Affect:  Congruent  Thought Process:  Goal Directed and Descriptions of Associations: Intact  Orientation:  Full (Time, Place, and Person)  Thought Content:  WDL  Suicidal Thoughts:  No  Homicidal Thoughts:  No  Memory:  Immediate;   Good Recent;   Good Remote;   Good  Judgement:  Fair  Insight:  Fair  Psychomotor Activity:  Normal  Concentration:  Concentration: Good and Attention Span: Good  Recall:  Good  Fund of Knowledge:  Good  Language:  Good  Akathisia:  No  Handed:  Right  AIMS (if indicated):     Assets:  Communication Skills Desire for Improvement Physical Health Social Support Transportation  ADL's:  Intact  Cognition:  WNL  Sleep:  Number of Hours: 6.25   Problems addressed Bipolar 2 disorder  Treatment Plan  Summary: Daily contact with patient to assess and evaluate symptoms and progress in treatment, Medication management and Plan is to: Continue Zyprexa 2.5 mg p.o. nightly for mood  stability Continue Neurontin 400 mg p.o. 3 times daily for pain and mood stability Continue Remeron 30 mg p.o. nightly for mood stability Start melatonin 3 mg p.o. nightly for insomnia Continue Vistaril 25 mg p.o. 3 times daily as needed for anxiety Continue Vistaril 50 mg p.o. nightly for insomnia CSW to assist with contact information for [facility] Encourage group therapy participation  Maryfrances Bunnell, FNP 02/07/2018, 1:14 PM    ..Agree with NP Progress Note

## 2018-02-07 NOTE — Progress Notes (Signed)
Pt attended AA group this evening.  

## 2018-02-07 NOTE — [facility] Group Notes (Signed)
[facility] Group Notes:  (Nursing)  Date:  02/07/2018  Time: 1:15 PM Type of Therapy:  Nurse Education  Participation Level:  Active  Participation Quality:  Appropriate and Attentive  Affect:  Appropriate  Cognitive:  Appropriate  Insight:  Appropriate  Engagement in Group:  Engaged  Modes of Intervention:  Discussion and Education  Summary of Progress/Problems: Nurse led group on life skills   Shela Nevin 02/07/2018, 6:45 PM

## 2018-02-07 NOTE — [facility] Group Notes (Signed)
LCSW Group Therapy Note  02/07/2018     10:00-11:00AM  Type of Therapy and Topic:  Group Therapy:  Decisional Balance/Substance Use  Participation Level:  Active        . Description of Group:  The main focus of today's process group was learning how to use a decisional balance exercise to make a decision about whether to change an unhealthy coping skill, as well as how to use the information gathered in the actual process of planning that change.  Patients listed some of their most frequently utilized unhealthy coping techniques and CSW pointed out the similarities.  Motivational Interviewing and the whiteboard were utilized to help patients explore in-depth the perceived benefits and costs of a specific, shared unhealthy coping technique (drinking & drugging) as well as the benefits and costs of replacing that with other, healthy coping skills.  A handout was distributed for patients to be able to do this exercise for themselves.     Therapeutic Goals 1. Patient will be able to utilize the decision balance exercise on their own 2. Patient will list coping skills they use to fulfill their needs 3. Patient will identify the differences between healthy and  unhealthy coping skills 4. Patient will verbalize the costs and benefits of drinking/drugging versus making the choice to change 5. Patient will learn how to use the exercise to identify the most important supports to put in place so that they can succeed in a change to which they commit  Summary of Patient Progress: During group, patient expressed that he will hold things/emotions in until he blows up and this is an unhealthy manner of coping.  Additionally he uses substances.  He interacted well and made good contributions to group.   Therapeutic Modalities Cognitive Behavioral Therapy Motivational Interviewing   [name], LCSW

## 2018-02-07 NOTE — Plan of Care (Signed)
  Problem: Activity: Goal: Interest or engagement in activities will improve Outcome: Progressing   Problem: Coping: Goal: Ability to verbalize frustrations and anger appropriately will improve Outcome: Progressing   Problem: Health Behavior/Discharge Planning: Goal: Compliance with treatment plan for underlying cause of condition will improve Outcome: Progressing   Problem: Safety: Goal: Periods of time without injury will increase Outcome: Progressing   D: Pt alert and oriented on the unit. Pt engaging with RN staff and other pts. Pt denies SI/HI, A/VH, and any pain. Pt attended groups and unit activities during the day. Pt stated that his goal for today is to "talk to social worker." Pt is pleasant, cooperative, and medication compliant.  A: Education, support and encouragement provided, q15 minute safety checks remain in effect. Medications administered per MD orders. R: No reactions/side effects to medicine noted. Pt denies any concerns at this time, and verbally contracts for safety.Pt ambulating on the unit with no issues. Pt remains safe on and off the unit.

## 2018-02-08 MED ORDER — BENZOCAINE 10 % MT GEL
Freq: Four times a day (QID) | OROMUCOSAL | Status: DC | PRN
  Administered 2018-02-08: 11:00:00 via OROMUCOSAL
  Filled 2018-02-08: qty 9.4

## 2018-02-08 MED ORDER — IBUPROFEN 600 MG PO TABS
600.0000 mg | ORAL_TABLET | Freq: Four times a day (QID) | ORAL | Status: DC | PRN
  Administered 2018-02-08 – 2018-02-09 (×2): 600 mg via ORAL
  Filled 2018-02-08 (×2): qty 1

## 2018-02-08 NOTE — Plan of Care (Signed)
  Problem: Activity: Goal: Interest or engagement in activities will improve Outcome: Progressing   Problem: Coping: Goal: Ability to demonstrate self-control will improve Outcome: Progressing   Problem: Safety: Goal: Periods of time without injury will increase Outcome: Progressing   D: Pt alert and oriented on the unit. Pt engaging with RN staff and other pts on the unit today attended group sessions. Pt denies SI/HI, A/VH.  Pt is pleasant and cooperative and medication complaint. A: Education, support and encouragement provided, q15 minute safety checks remain in effect. Medications administered per MD orders. R: No reactions/side effects to medicine noted. Pt denies any concerns at this time, and verbally contracts for safety. Pt remains safe on and off the unit.

## 2018-02-08 NOTE — Progress Notes (Signed)
Patient did attend the first half of the evening speaker AA meeting and then exited group and returned to his room.

## 2018-02-08 NOTE — [facility] Group Notes (Signed)
[facility] LCSW Group Therapy Note  02/08/2018  10:00-11:00AM  Type of Therapy and Topic:  Group Therapy:  Adding Supports Including Being Your Own Support  Participation Level:  Active   Description of Group:  Patients in this group were introduced to the concept that additional supports including self-support are an essential part of recovery.  A song entitled "I Need Help!" was played and a group discussion was held in reaction to the idea of needing to add supports.  A song entitled "My Own Hero" was played and a group discussion ensued in which patients stated they could relate to the song and it inspired them to realize they have be willing to help themselves in order to succeed, because other people cannot achieve sobriety or stability for them.  We discussed adding a variety of healthy supports to address the various needs in their lives.  A song was played called "I Know Where I've Been" toward the end of group and used to conduct an inspirational wrap-up to group of remembering how far they have already come in their journey.  Therapeutic Goals: 1)  demonstrate the importance of being a part of one's own support system 2)  discuss reasons people in one's life may eventually be unable to be continually supportive  3)  identify the patient's current support system and   4)  elicit commitments to add healthy supports and to become more conscious of being self-supportive   Summary of Patient Progress:  The patient expressed that his brother, a friend, and Alcoholics Anonymous are his healthy supports while everybody else including himself is unhealthy for him.  He spent some time perseverating over his dislike of people in general.  He stated he wants to add therapy when he leaves the hospital   Therapeutic Modalities:   Motivational Interviewing Activity  [name]

## 2018-02-08 NOTE — [facility] Group Notes (Signed)
[facility] Group Notes:  (Nursing)  Date:  02/08/2018  Time: 1:15 PM Type of Therapy:  Nurse Education  Participation Level:  Active  Participation Quality:  Appropriate  Affect:  Appropriate  Cognitive:  Appropriate  Insight:  Appropriate  Engagement in Group:  Engaged  Modes of Intervention:  Discussion and Education  Summary of Progress/Problems: Nurse led group: Healthy Support Systems  Shela Nevin 02/08/2018, 4:54 PM

## 2018-02-08 NOTE — Progress Notes (Addendum)
[facility] MD Progress Note  02/08/2018 2:31 PM Devin [name]  MRN:  [id number]   Subjective: Patient states that the other medication changes from yesterday all worked really well.  He denies any suicidal or homicidal ideations and denies any hallucinations.  He reports sleeping well last night and having a good appetite.  He states that he has been going to groups and is doing really well today.  His only complaint today is that he cracked a tooth and he is in some pain.  He is asking for some type of medication to help relieve some of his pain.  Patient then states that he plans on still discharging tomorrow and going to [facility] right after he leaves.  Objective: Patient's chart and findings reviewed and discussed with treatment team.  Patient presents in the day room and has been interacting with peers and staff appropriately.  Patient has been joking and laughing with numerous people.  Patient has been attending groups.  Patient is informed today that he will definitely discharge tomorrow and he is in agreement.  He continues to ask for medication for his reported cracked tooth and he is given some Orajel.  Principal Problem: Bipolar II disorder (HCC) Diagnosis:   Patient Active Problem List   Diagnosis Date Noted  . MDD (major depressive disorder), recurrent severe, without psychosis (HCC) [F33.2] 01/31/2018  . Bipolar II disorder (HCC) [F31.81] 01/31/2018  . Cocaine abuse with cocaine-induced mood disorder (HCC) [F14.14] 03/22/2017  . Hypoxia [R09.02]   . COPD exacerbation (HCC) [J44.1]   . Leukocytosis [D72.829] 11/04/2015  . Transaminitis [R74.0] 07/17/2015  . Tooth ache [K08.89] 07/17/2015  . Asthma with status asthmaticus [J45.902] 10/14/2011  . ADHD (attention deficit hyperactivity disorder) [F90.9] 10/14/2011   Total Time spent with patient: 30 minutes  Past Psychiatric History: See H&P  Past Medical History:  Past Medical History:  Diagnosis Date  . ADHD (attention  deficit hyperactivity disorder)   . Asthma   . GERD (gastroesophageal reflux disease)   . Transaminitis 07/17/2015    Past Surgical History:  Procedure Laterality Date  . TYMPANOPLASTY Left 1997   Family History:  Family History  Problem Relation Age of Onset  . Coronary artery disease Father   . Heart attack Father   . Heart disease Father   . Heart failure Mother   . Hyperlipidemia Mother   . Hypertension Mother   . Migraines Mother   . COPD Mother   . Diabetes Maternal Aunt   . Asthma Maternal Grandfather   . Asthma Paternal Grandfather    Family Psychiatric  History: See H&P Social History:  Social History   Substance and Sexual Activity  Alcohol Use Yes  . Alcohol/week: 0.0 standard drinks     Social History   Substance and Sexual Activity  Drug Use Yes  . Types: Marijuana, Cocaine   Comment:                      Social History   Socioeconomic History  . Marital status: Single    Spouse name: Not on file  . Number of children: 2  . Years of education: 12th grade  . Highest education level: 12th grade  Occupational History  . 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 Financial resource strain: Hard  . Food insecurity:    Worry: Sometimes true    Inability: Often true  . Transportation needs:    Medical: Yes    Non-medical:  Yes  Tobacco Use  . Smoking status: Current Every Day Smoker    Packs/day: 0.50    Types: Cigarettes  . Smokeless tobacco: Never Used  Substance and Sexual Activity  . Alcohol use: Yes    Alcohol/week: 0.0 standard drinks  . Drug use: Yes    Types: Marijuana, Cocaine    Comment:                    . Sexual activity: Not Currently  Lifestyle  . Physical activity:    Days per week: 0 days    Minutes per session: 0 min  . Stress: Very much  Relationships  . Social connections:    Talks on phone: Once a week    Gets together: Never    Attends religious service: Never    Active member of club or organization: No     Attends meetings of clubs or organizations: Not on file    Relationship status: Divorced  Other Topics Concern  . Not on file  Social History Narrative  . Not on file   Additional Social History:                         Sleep: Good  Appetite:  Good  Current Medications: Current Facility-Administered Medications  Medication Dose Route Frequency Provider Last Rate Last Dose  . acetaminophen (TYLENOL) tablet 650 mg  650 mg Oral Q6H PRN [name], NP   650 mg at 02/08/18 0859  . albuterol (PROVENTIL HFA;VENTOLIN HFA) 108 (90 Base) MCG/ACT inhaler 1-2 puff  1-2 puff Inhalation Q4H PRN Donell Sievert E, PA-C   2 puff at 02/07/18 1838  . albuterol (PROVENTIL) (2.5 MG/3ML) 0.083% nebulizer solution 2.5 mg  2.5 mg Nebulization Q4H PRN Antonieta Pert, MD   2.5 mg at 02/08/18 1610  . alum & mag hydroxide-simeth (MAALOX/MYLANTA) 200-200-20 MG/5ML suspension 30 mL  30 mL Oral Q4H PRN [name], NP      . benzocaine (ORAJEL) 10 % mucosal gel   Mouth/Throat QID PRN Money, Gerlene Burdock, FNP      . fluticasone (FLOVENT HFA) 44 MCG/ACT inhaler 2 puff  2 puff Inhalation BID Antonieta Pert, MD   2 puff at 02/08/18 [phone]  . gabapentin (NEURONTIN) capsule 400 mg  400 mg Oral TID Antonieta Pert, MD   400 mg at 02/08/18 1121  . hydrOXYzine (ATARAX/VISTARIL) tablet 25 mg  25 mg Oral TID PRN Donell Sievert E, PA-C   25 mg at 02/08/18 1417  . hydrOXYzine (ATARAX/VISTARIL) tablet 50 mg  50 mg Oral QHS Money, Gerlene Burdock, FNP   50 mg at 02/07/18 2124  . magnesium hydroxide (MILK OF MAGNESIA) suspension 30 mL  30 mL Oral Daily PRN [name], NP      . Melatonin TABS 3 mg  3 mg Oral QHS Money, Gerlene Burdock, FNP   3 mg at 02/07/18 2139  . mirtazapine (REMERON) tablet 30 mg  30 mg Oral QHS Antonieta Pert, MD   30 mg at 02/07/18 2124  . multivitamin with minerals tablet 1 tablet  1 tablet Oral Daily Antonieta Pert, MD   1 tablet at 02/08/18 0815  . nicotine (NICODERM  CQ - dosed in mg/24 hours) patch 21 mg  21 mg Transdermal Daily Antonieta Pert, MD   21 mg at 02/08/18 0820  . OLANZapine (ZYPREXA) tablet 2.5 mg  2.5 mg Oral QHS Evangelynn Lochridge, Rockey Situ,  MD   2.5 mg at 02/07/18 2124  . pantoprazole (PROTONIX) EC tablet 40 mg  40 mg Oral Daily Kerry Hough, PA-C   40 mg at 02/08/18 0815  . thiamine (VITAMIN B-1) tablet 100 mg  100 mg Oral Daily Antonieta Pert, MD   100 mg at 02/08/18 1610    Lab Results: No results found for this or any previous visit (from the past 48 hour(s)).  Blood Alcohol level:  Lab Results  Component Value Date   ETH 117 (H) 01/31/2018   ETH <10 06/28/2017    Metabolic Disorder Labs: No results found for: HGBA1C, MPG No results found for: PROLACTIN No results found for: CHOL, TRIG, HDL, CHOLHDL, VLDL, LDLCALC  Physical Findings: AIMS: Facial and Oral Movements Muscles of Facial Expression: None, normal Lips and Perioral Area: None, normal Jaw: None, normal Tongue: None, normal,Extremity Movements Upper (arms, wrists, hands, fingers): None, normal Lower (legs, knees, ankles, toes): None, normal, Trunk Movements Neck, shoulders, hips: None, normal, Overall Severity Severity of abnormal movements (highest score from questions above): None, normal Incapacitation due to abnormal movements: None, normal Patient's awareness of abnormal movements (rate only patient's report): No Awareness, Dental Status Current problems with teeth and/or dentures?: No Does patient usually wear dentures?: No  CIWA:  CIWA-Ar Total: 1 COWS:  COWS Total Score: 2  Musculoskeletal: Strength & Muscle Tone: within normal limits Gait & Station: normal Patient leans: N/A  Psychiatric Specialty Exam: Physical Exam  Nursing note and vitals reviewed. Constitutional: He is oriented to person, place, and time. He appears well-developed and well-nourished.  Cardiovascular: Normal rate.  Respiratory: Effort normal.  Musculoskeletal: Normal range  of motion.  Neurological: He is alert and oriented to person, place, and time.  Skin: Skin is warm.    Review of Systems  Constitutional: Negative.   HENT: Negative.   Eyes: Negative.   Respiratory: Negative.   Cardiovascular: Negative.   Gastrointestinal: Negative.   Genitourinary: Negative.   Musculoskeletal: Negative.   Skin: Negative.   Neurological: Negative.   Endo/Heme/Allergies: Negative.   Psychiatric/Behavioral: Negative.  Negative for hallucinations and suicidal ideas.    Blood pressure 117/84, pulse 89, temperature 97.6 F (36.4 C), resp. rate 16, height 6' (1.829 m), weight 83.9 kg, SpO2 100 %.Body mass index is 25.09 kg/m.  General Appearance: Casual  Eye Contact:  Good  Speech:  Clear and Coherent and Normal Rate  Volume:  Normal  Mood:  Euthymic  Affect:  Congruent  Thought Process:  Goal Directed and Descriptions of Associations: Intact  Orientation:  Full (Time, Place, and Person)  Thought Content:  WDL  Suicidal Thoughts:  No  Homicidal Thoughts:  No  Memory:  Immediate;   Good Recent;   Good Remote;   Good  Judgement:  Fair  Insight:  Fair  Psychomotor Activity:  Normal  Concentration:  Concentration: Good and Attention Span: Good  Recall:  Good  Fund of Knowledge:  Good  Language:  Good  Akathisia:  No  Handed:  Right  AIMS (if indicated):     Assets:  Communication Skills Desire for Improvement Physical Health Social Support Transportation  ADL's:  Intact  Cognition:  WNL  Sleep:  Number of Hours: 6.25   Problems addressed Bipolar 2 disorder  Treatment Plan Summary: Daily contact with patient to assess and evaluate symptoms and progress in treatment, Medication management and Plan is to: Continue Zyprexa 2.5 mg p.o. nightly for mood stability Continue Neurontin 400 mg p.o. 3 times daily  for pain and mood stability Continue Remeron 30 mg p.o. nightly for mood stability Continue melatonin 3 mg p.o. nightly for insomnia Continue  Vistaril 25 mg p.o. 3 times daily as needed for anxiety Continue Vistaril 50 mg p.o. nightly for insomnia Start Orajel 10% applied to affected area 4 times a day as needed for tooth pain Encourage group therapy participation [facility] F[facility] 02/08/2018, 2:31 PM    ..Agree with NP Progress Note

## 2018-02-09 MED ORDER — BENZOCAINE 10 % MT GEL: Freq: Four times a day (QID) | OROMUCOSAL | 0 refills | Status: DC | PRN

## 2018-02-09 MED ORDER — NICOTINE 21 MG/24HR TD PT24: 21.0000 mg | MEDICATED_PATCH | Freq: Every day | TRANSDERMAL | 0 refills | Status: DC

## 2018-02-09 MED ORDER — FLUTICASONE PROPIONATE HFA 44 MCG/ACT IN AERO: 2.0000 | INHALATION_SPRAY | Freq: Two times a day (BID) | RESPIRATORY_TRACT | 12 refills | Status: DC

## 2018-02-09 MED ORDER — MELATONIN 3 MG PO TABS: 3.0000 mg | ORAL_TABLET | Freq: Every day | ORAL | 0 refills | Status: DC

## 2018-02-09 MED ORDER — FLUTICASONE PROPIONATE 50 MCG/ACT NA SUSP: 1.0000 | Freq: Every day | NASAL | 2 refills | Status: DC

## 2018-02-09 MED ORDER — ALBUTEROL SULFATE HFA 108 (90 BASE) MCG/ACT IN AERS: 1.0000 | INHALATION_SPRAY | RESPIRATORY_TRACT | Status: DC | PRN

## 2018-02-09 MED ORDER — PANTOPRAZOLE SODIUM 40 MG PO TBEC: 40.0000 mg | DELAYED_RELEASE_TABLET | Freq: Every day | ORAL | 0 refills | Status: DC

## 2018-02-09 MED ORDER — HYDROXYZINE HCL 25 MG PO TABS: ORAL_TABLET | ORAL | 0 refills | Status: DC

## 2018-02-09 MED ORDER — OLANZAPINE 2.5 MG PO TABS: 2.5000 mg | ORAL_TABLET | Freq: Every day | ORAL | 0 refills | Status: DC

## 2018-02-09 MED ORDER — MIRTAZAPINE 30 MG PO TABS: 30.0000 mg | ORAL_TABLET | Freq: Every day | ORAL | 0 refills | Status: DC

## 2018-02-09 MED ORDER — GABAPENTIN 400 MG PO CAPS: 400.0000 mg | ORAL_CAPSULE | Freq: Three times a day (TID) | ORAL | 0 refills | Status: DC

## 2018-02-09 NOTE — Progress Notes (Signed)
Discharge note: Patient reviewed discharge paperwork with RN including prescriptions, follow up appointments, and lab work. Patient given the opportunity to ask questions. All concerns were addressed. All belongings were returned to patient. Denied SI/HI/AVH. Patient thanked staff for their care while at the hospital.  Patient was discharged to lobby where he was getting a ride with another patient.

## 2018-02-09 NOTE — Progress Notes (Signed)
Chaplain providing follow up support with Devin [name] around discharge, faith traditions and rituals.   Devin [name] expresses hopefulness in his discharge plans - especially around being able to go home and be with fiance.  He reports they have had productive conversations.  He is hopeful to visit the [employer] and re-engage with his faith.  He is hopeful to find a parish that his fiance feels comfortable.  Chaplain consulted with nurse techs and provided Devin [name] with bible as well as rosary at request.

## 2018-02-09 NOTE — Plan of Care (Signed)
Problem: Education: Goal: Knowledge of [facility] Education information/materials will improve Outcome: Adequate for Discharge Goal: Emotional status will improve Outcome: Adequate for Discharge Goal: Mental status will improve Outcome: Adequate for Discharge Goal: Verbalization of understanding the information provided will improve Outcome: Adequate for Discharge   Problem: Activity: Goal: Interest or engagement in activities will improve Outcome: Adequate for Discharge Goal: Sleeping patterns will improve Outcome: Adequate for Discharge   Problem: Coping: Goal: Ability to verbalize frustrations and anger appropriately will improve Outcome: Adequate for Discharge Goal: Ability to demonstrate self-control will improve Outcome: Adequate for Discharge   Problem: Health Behavior/Discharge Planning: Goal: Identification of resources available to assist in meeting health care needs will improve Outcome: Adequate for Discharge Goal: Compliance with treatment plan for underlying cause of condition will improve Outcome: Adequate for Discharge   Problem: Physical Regulation: Goal: Ability to maintain clinical measurements within normal limits will improve Outcome: Adequate for Discharge   Problem: Safety: Goal: Periods of time without injury will increase Outcome: Adequate for Discharge   Problem: Education: Goal: Knowledge of disease or condition will improve Outcome: Adequate for Discharge Goal: Understanding of discharge needs will improve Outcome: Adequate for Discharge   Problem: Health Behavior/Discharge Planning: Goal: Ability to identify changes in lifestyle to reduce recurrence of condition will improve Outcome: Adequate for Discharge Goal: Identification of resources available to assist in meeting health care needs will improve Outcome: Adequate for Discharge   Problem: Physical Regulation: Goal: Complications related to the disease process, condition or  treatment will be avoided or minimized Outcome: Adequate for Discharge   Problem: Safety: Goal: Ability to remain free from injury will improve Outcome: Adequate for Discharge   Problem: Education: Goal: Ability to make informed decisions regarding treatment will improve Outcome: Adequate for Discharge   Problem: Coping: Goal: Coping ability will improve Outcome: Adequate for Discharge   Problem: Health Behavior/Discharge Planning: Goal: Identification of resources available to assist in meeting health care needs will improve Outcome: Adequate for Discharge   Problem: Self-Concept: Goal: Ability to disclose and discuss suicidal ideas will improve Outcome: Adequate for Discharge Goal: Will verbalize positive feelings about self Outcome: Adequate for Discharge   Problem: Education: Goal: Ability to state activities that reduce stress will improve Outcome: Adequate for Discharge   Problem: Coping: Goal: Ability to identify and develop effective coping behavior will improve Outcome: Adequate for Discharge   Problem: Self-Concept: Goal: Ability to identify factors that promote anxiety will improve Outcome: Adequate for Discharge Goal: Level of anxiety will decrease Outcome: Adequate for Discharge Goal: Ability to modify response to factors that promote anxiety will improve Outcome: Adequate for Discharge   Problem: Education: Goal: Utilization of techniques to improve thought processes will improve Outcome: Adequate for Discharge Goal: Knowledge of the prescribed therapeutic regimen will improve Outcome: Adequate for Discharge   Problem: Activity: Goal: Interest or engagement in leisure activities will improve Outcome: Adequate for Discharge Goal: Imbalance in normal sleep/wake cycle will improve Outcome: Adequate for Discharge   Problem: Coping: Goal: Coping ability will improve Outcome: Adequate for Discharge Goal: Will verbalize feelings Outcome: Adequate for  Discharge   Problem: Health Behavior/Discharge Planning: Goal: Ability to make decisions will improve Outcome: Adequate for Discharge Goal: Compliance with therapeutic regimen will improve Outcome: Adequate for Discharge   Problem: Education: Goal: Emotional status will improve Outcome: Adequate for Discharge Goal: Mental status will improve Outcome: Adequate for Discharge Goal: Verbalization of understanding the information provided will improve Outcome: Adequate for Discharge  Problem: Activity: Goal: Interest or engagement in activities will improve Outcome: Adequate for Discharge   Problem: Spiritual Needs Goal: Ability to function at adequate level Outcome: Adequate for Discharge

## 2018-02-09 NOTE — [facility] Suicide Risk Assessment (Addendum)
[facility] Discharge Suicide Risk Assessment   Principal Problem: Bipolar II disorder [facility]) Discharge Diagnoses:  Patient Active Problem List   Diagnosis Date Noted  . MDD (major depressive disorder), recurrent severe, without psychosis (HCC) [F33.2] 01/31/2018  . Bipolar II disorder (HCC) [F31.81] 01/31/2018  . Cocaine abuse with cocaine-induced mood disorder (HCC) [F14.14] 03/22/2017  . Hypoxia [R09.02]   . COPD exacerbation (HCC) [J44.1]   . Leukocytosis [D72.829] 11/04/2015  . Transaminitis [R74.0] 07/17/2015  . Tooth ache [K08.89] 07/17/2015  . Asthma with status asthmaticus [J45.902] 10/14/2011  . ADHD (attention deficit hyperactivity disorder) [F90.9] 10/14/2011    Total Time spent with patient: 30 minutes  Musculoskeletal: Strength & Muscle Tone: within normal limits Gait & Station: normal Patient leans: N/A  Psychiatric Specialty Exam: ROS denies headache, no chest pain, reports some dental pain, no chest pain, no shortness of breath, no vomiting, no fever  Blood pressure 108/73, pulse 89, temperature 98.2 F (36.8 C), resp. rate 16, height 6' (1.829 m), weight 83.9 kg, SpO2 100 %.Body mass index is 25.09 kg/m.  General Appearance: Well Groomed  Eye Contact::  Good  Speech:  Normal Rate409  Volume:  Normal  Mood:  reports mood is " a lot better"  Affect:  appropriate, reactive, less anxious   Thought Process:  Linear and Descriptions of Associations: Intact  Orientation:  Full (Time, Place, and Person)  Thought Content:  no hallucinatons, no delusions, not internally preoccupied   Suicidal Thoughts:  No denies suicidal or self injurious ideations, denies homicidal or violent ideations  Homicidal Thoughts:  No  Memory:  recent and remote grossly intact   Judgement:  Other:  improving  Insight:  improving   Psychomotor Activity:  Normal  Concentration:  Good  Recall:  Good  Fund of Knowledge:Good  Language: Good  Akathisia:  Negative  Handed:  Right  AIMS (if  indicated):     Assets:  Communication Skills Desire for Improvement Resilience  Sleep:  Number of Hours: 5.5  Cognition: WNL  ADL's:  Intact   Mental Status Per Nursing Assessment::   On Admission:  Suicidal ideation indicated by patient  Demographic Factors:  37, single, has two daughter, lives with GF, currently unemployed   Loss Factors: Recent relapse, relationship stressors   Historical Factors: Polysubstance use disorder- alcohol, cocaine, cannabis .  Reports history of mood disorder.  History of prior psychiatric admissions   Risk Reduction Factors:   Responsible for children under 36 years of age, Sense of responsibility to family, Living with another person, especially a relative and Positive coping skills or problem solving skills  Continued Clinical Symptoms:  At this time patient is alert, attentive, well related, mood improved , reports feeling " a lot better", affect appropriate, more reactive, no thought disorder , no suicidal or self injurious ideations, no homicidal ideations, no psychotic symptoms, future oriented, and states " I am looking forward to seeing my kids again", and also states he is planning on seeking a job . States he is now sleeping " a lot better" than he had been. Denies medication side effects. Side effects reviewed . States he had good communication with his GF and that they have worked issues out. States " I am really happy because she is going to let me come home, see my kids". No disruptive or agitated behaviors on unit   Cognitive Features That Contribute To Risk:  No gross cognitive deficits noted upon discharge. Is alert , attentive, and oriented x 3  Suicide Risk:  Mild:  Suicidal ideation of limited frequency, intensity, duration, and specificity.  There are no identifiable plans, no associated intent, mild dysphoria and related symptoms, good self-control (both objective and subjective assessment), few other risk factors, and  identifiable protective factors, including available and accessible social support.  Follow-up Information    [facility] Go on 02/11/2018.   Specialty:  Behavioral Health Why:  Appointment for medication management and therapy services is Wednesday, 02/11/18 at 8:00am. Please be sure to bring your Photo ID, any insurance information and any dicharge paperwork including your list of medications.  Contact information: [address] [place] [address] [phone]           Plan Of Care/Follow-up recommendations:  Activity:  as tolerated  Diet:  regular Tests:  NA Other:  See below  Patient expresses readiness for discharge, leaving unit in good spirits Plans to return home Plans to follow up as above and to go to 12 step meetings regularly   Craige Cotta, MD 02/09/2018, 1:04 PM

## 2018-02-09 NOTE — Progress Notes (Signed)
Recreation Therapy Notes  Date: 10.21.19 Time: 0930 Location: [address] Dayroom  Group Topic: Stress Management  Goal Area(s) Addresses:  Patient will verbalize importance of using healthy stress management.  Patient will identify positive emotions associated with healthy stress management.   Behavioral Response: Engaged  Intervention: Stress Management  Activity : Meditation.  LRT introduced the stress management technique of meditation.  LRT played a meditation that dealt with impermanence.  Patients were to follow along as the meditation played to engage in the meditation.  Education:  Stress Management, Discharge Planning.   Education Outcome: Acknowledges edcuation/In group clarification offered/Needs additional education  Clinical Observations/Feedback: Pt attended and participated in group.    Caroll Rancher, LRT/CTRS         Lillia Abed, Mitsuye Schrodt A 02/09/2018 11:07 AM

## 2018-02-09 NOTE — Discharge Summary (Addendum)
Physician Discharge Summary Note  Patient:  Devin [name] is an 37 y.o., male  MRN:  [id number]  DOB:  11/14/1980  Patient phone:  [phone] (home)   Patient address:   [address] [place] [address],   Total Time spent with patient: Greater than 30 minutes  Date of Admission:  01/31/2018  Date of Discharge: 02-09-18  Reason for Admission: Suicidal ideation.   Principal Problem: Bipolar II disorder [facility])  Discharge Diagnoses: Patient Active Problem List   Diagnosis Date Noted  . MDD (major depressive disorder), recurrent severe, without psychosis (HCC) [F33.2] 01/31/2018  . Bipolar II disorder (HCC) [F31.81] 01/31/2018  . Cocaine abuse with cocaine-induced mood disorder (HCC) [F14.14] 03/22/2017  . Hypoxia [R09.02]   . COPD exacerbation (HCC) [J44.1]   . Leukocytosis [D72.829] 11/04/2015  . Transaminitis [R74.0] 07/17/2015  . Tooth ache [K08.89] 07/17/2015  . Asthma with status asthmaticus [J45.902] 10/14/2011  . ADHD (attention deficit hyperactivity disorder) [F90.9] 10/14/2011   Past Psychiatric History: Polysubstance use disorders, Substance induced mood disorder.  Past Medical History:  Past Medical History:  Diagnosis Date  . ADHD (attention deficit hyperactivity disorder)   . Asthma   . GERD (gastroesophageal reflux disease)   . Transaminitis 07/17/2015    Past Surgical History:  Procedure Laterality Date  . TYMPANOPLASTY Left 1997   Family History:  Family History  Problem Relation Age of Onset  . Coronary artery disease Father   . Heart attack Father   . Heart disease Father   . Heart failure Mother   . Hyperlipidemia Mother   . Hypertension Mother   . Migraines Mother   . COPD Mother   . Diabetes Maternal Aunt   . Asthma Maternal Grandfather   . Asthma Paternal Grandfather    Family Psychiatric  History: See H&P  Social History:  Social History   Substance and Sexual Activity  Alcohol Use Yes  . Alcohol/week: 0.0  standard drinks     Social History   Substance and Sexual Activity  Drug Use Yes  . Types: Marijuana, Cocaine   Comment:                      Social History   Socioeconomic History  . Marital status: Single    Spouse name: Not on file  . Number of children: 2  . Years of education: 12th grade  . Highest education level: 12th grade  Occupational History  . 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 Financial resource strain: Hard  . Food insecurity:    Worry: Sometimes true    Inability: Often true  . Transportation needs:    Medical: Yes    Non-medical: Yes  Tobacco Use  . Smoking status: Current Every Day Smoker    Packs/day: 0.50    Types: Cigarettes  . Smokeless tobacco: Never Used  Substance and Sexual Activity  . Alcohol use: Yes    Alcohol/week: 0.0 standard drinks  . Drug use: Yes    Types: Marijuana, Cocaine    Comment:                    . Sexual activity: Not Currently  Lifestyle  . Physical activity:    Days per week: 0 days    Minutes per session: 0 min  . Stress: Very much  Relationships  . Social connections:    Talks on phone: Once a week    Gets together: Never  Attends religious service: Never    Active member of club or organization: No    Attends meetings of clubs or organizations: Not on file    Relationship status: Divorced  Other Topics Concern  . Not on file  Social History Narrative  . Not on file   Hospital Course: (Per Md's admission evaluation): Patient is a 37 year old male with a past psychiatric history significant for polysubstance use disorder, attention deficit disorder, and depression who presented to the [facility] emergency department on 01/31/2018 with suicidal ideation. The patient stated that he had been sober from substances following an admission to [facility] in December 2018.  He stated that he had relapsed on 01/31/2018 on cocaine and cannabis.  This was after he had a verbal altercation with his  partner of 2 years who is currently pregnant.  This is very similar to a presentation to the emergency room in December.  He was quite tearful on admission.  He stated that he is unable to live without her, and had a plan to "run into traffic".  He had been previously diagnosed with depression at [facility] 3 years ago, but had been unable to afford his medications.  He also has a significant past medical history for asthma.  He has been in the emergency room on multiple occasions because of his asthma.  He was started on a prednisone taper in the emergency room yesterday.  He was admitted to the psychiatric hospital for evaluation and stabilization.  After the above admission assessment, Devin [name] presenting symptoms were noted. The medication regimen for those presenting symptoms were discussed & initiated with patient's consent. He was medicated & discharged on; Gabapentin 400 mg for agitation, Vistaril 25 mg prn for anxiety, Vistaril 50 mg Q hs for tension, Melatonin 3 mg Q hs for insomnia, Mirtazapine 30 mg for depression & olanzapine 2.5 mg Q hs for mood control. He was enrolled & participated in the group counseling sessions being offered & held on this unit. He learned coping skills. He received other medication regimen for the other medical issues presented. He tolerated his treatment regimen without any adverse effects or reactions reported.  Devin [name]'s symptoms responded well to his treatment regimen. He is currently mentally & medically stable to be discharged to continue mental health care on an outpatient basis as noted below. He is provided with all the necessary information needed to make this appointment without problems.    Upon discharge,  Devin [name] is alert, attentive, well related, mood improved, reports feeling "a lot better", affect appropriate, more reactive, no thought disorder, no suicidal or self injurious ideations, no homicidal ideations, no psychotic symptoms, future oriented, and states " I am  looking forward to seeing my kids again", and also states he is planning on seeking a job. States he is now sleeping " a lot better" than he had been. Denies medication side effects. Side effects reviewed. States he had good communication with his GF and that they have worked issues out. States " I am really happy because she is going to let me come home, see my kids". No disruptive or agitated behaviors on unit, He received a 7 days worth supply samples of his [facility] discharge medications including prescritons. He left [facility] with all personal belongings in no apparent distress.  Physical Findings: AIMS: Facial and Oral Movements Muscles of Facial Expression: None, normal Lips and Perioral Area: None, normal Jaw: None, normal Tongue: None, normal,Extremity Movements Upper (arms, wrists, hands, fingers): None, normal Lower (  legs, knees, ankles, toes): None, normal, Trunk Movements Neck, shoulders, hips: None, normal, Overall Severity Severity of abnormal movements (highest score from questions above): None, normal Incapacitation due to abnormal movements: None, normal Patient's awareness of abnormal movements (rate only patient's report): No Awareness, Dental Status Current problems with teeth and/or dentures?: No Does patient usually wear dentures?: No  CIWA:  CIWA-Ar Total: 1 COWS:  COWS Total Score: 2  Musculoskeletal: Strength & Muscle Tone: within normal limits Gait & Station: normal Patient leans: N/A  Psychiatric Specialty Exam: Physical Exam  Nursing note and vitals reviewed. Constitutional: He appears well-developed.  HENT:  Head: Normocephalic.  Eyes: Pupils are equal, round, and reactive to light.  Neck: Normal range of motion.  Cardiovascular: Normal rate.  Respiratory: Effort normal.  GI: Soft.  Genitourinary:  Genitourinary Comments: Stable  Musculoskeletal: Normal range of motion.  Neurological: He is alert.  Skin: Skin is warm.    Review of Systems  Constitutional:  Negative.   HENT: Negative.   Eyes: Negative.   Respiratory: Negative.  Negative for cough and shortness of breath.   Cardiovascular: Negative.  Negative for chest pain and palpitations.  Gastrointestinal: Negative.   Genitourinary: Negative.   Musculoskeletal: Negative.   Skin: Negative.   Neurological: Negative.   Endo/Heme/Allergies: Negative.   Psychiatric/Behavioral: Positive for depression (Stable) and substance abuse (Hx. Alcohol, cocaine & THC use disorder (stable)). Negative for hallucinations, memory loss and suicidal ideas. The patient has insomnia (Stable). The patient is not nervous/anxious.     Blood pressure 108/73, pulse 89, temperature 98.2 F (36.8 C), resp. rate 16, height 6' (1.829 m), weight 83.9 kg, SpO2 100 %.Body mass index is 25.09 kg/m.  See Md's SRA   Have you used any form of tobacco in the last 30 days? (Cigarettes, Smokeless Tobacco, Cigars, and/or Pipes): Yes  Has this patient used any form of tobacco in the last 30 days? (Cigarettes, Smokeless Tobacco, Cigars, and/or Pipes): Yes, an FDA-approved tobacco cessation medication was offered at discharge.  Blood Alcohol level:  Lab Results  Component Value Date   ETH 117 (H) 01/31/2018   ETH <10 06/28/2017   Metabolic Disorder Labs:  No results found for: HGBA1C, MPG No results found for: PROLACTIN No results found for: CHOL, TRIG, HDL, CHOLHDL, VLDL, LDLCALC  See Psychiatric Specialty Exam and Suicide Risk Assessment completed by Attending Physician prior to discharge.  Discharge destination:  Home  Is patient on multiple antipsychotic therapies at discharge:  No   Has Patient had three or more failed trials of antipsychotic monotherapy by history:  No  Recommended Plan for Multiple Antipsychotic Therapies: NA  Allergies as of 02/09/2018      Reactions   Augmentin [amoxicillin-pot Clavulanate] Hives   Tolerates Amoxicillin - Thuy 09/29/12   Other Nausea And Vomiting   coconut      Medication  List    STOP taking these medications   benzonatate 100 MG capsule Commonly known as:  TESSALON   ibuprofen 200 MG tablet Commonly known as:  ADVIL,MOTRIN   naproxen 500 MG tablet Commonly known as:  NAPROSYN   ondansetron 4 MG disintegrating tablet Commonly known as:  ZOFRAN-ODT     TAKE these medications     Indication  albuterol 108 (90 Base) MCG/ACT inhaler Commonly known as:  PROVENTIL HFA;VENTOLIN HFA Inhale 1-2 puffs into the lungs every 4 (four) hours as needed for wheezing or shortness of breath. What changed:    when to take this  Another medication with  the same name was removed. Continue taking this medication, and follow the directions you see here.  Indication:  Asthma   benzocaine 10 % mucosal gel Commonly known as:  ORAJEL Use as directed in the mouth or throat 4 (four) times daily as needed for mouth pain.  Indication:  Mouth pain   fluticasone 44 MCG/ACT inhaler Commonly known as:  FLOVENT HFA Inhale 2 puffs into the lungs 2 (two) times daily. For asthma  Indication:  Asthma   fluticasone 50 MCG/ACT nasal spray Commonly known as:  FLONASE Place 1 spray into both nostrils daily. For allergies What changed:  additional instructions  Indication:  Signs and Symptoms of Nose Diseases   gabapentin 400 MG capsule Commonly known as:  NEURONTIN Take 1 capsule (400 mg total) by mouth 3 (three) times daily. For agitation  Indication:  Agitation   hydrOXYzine 25 MG tablet Commonly known as:  ATARAX/VISTARIL Take 1 tablet (25 mg) by mouth three times daily & 2 tablets (50 mg) at bedtime for anxiety/sleep  Indication:  Feeling Anxious, Insomnia   Melatonin 3 MG Tabs Take 1 tablet (3 mg total) by mouth at bedtime. For sleep  Indication:  Trouble Sleeping   mirtazapine 30 MG tablet Commonly known as:  REMERON Take 1 tablet (30 mg total) by mouth at bedtime. For depression  Indication:  Major Depressive Disorder   nicotine 21 mg/24hr patch Commonly  known as:  NICODERM CQ - dosed in mg/24 hours Place 1 patch (21 mg total) onto the skin daily. (May buy from over the counter): For smoking cessation Start taking on:  02/10/2018  Indication:  Nicotine Addiction   OLANZapine 2.5 MG tablet Commonly known as:  ZYPREXA Take 1 tablet (2.5 mg total) by mouth at bedtime. For mood control  Indication:  Mood control   pantoprazole 40 MG tablet Commonly known as:  PROTONIX Take 1 tablet (40 mg total) by mouth daily. For acid reflux Start taking on:  02/10/2018  Indication:  Gastroesophageal Reflux Disease      Follow-up Information    [facility]. Go on 02/11/2018.   Specialty:  Behavioral Health Why:  Appointment for medication management and therapy services is Wednesday, 02/11/18 at 8:00am. Please be sure to bring your Photo ID, any insurance information and any dicharge paperwork including your list of medications.  [address] [place] [address] [phone]          Follow-up recommendations: Activity:  As tolerated Diet: As recommended by your primary care doctor. Keep all scheduled follow-up appointments as recommended.   Comments: Patient is instructed prior to discharge to: Take all medications as prescribed by his/her mental healthcare provider. Report any adverse effects and or reactions from the medicines to his/her outpatient provider promptly. Patient has been instructed & cautioned: To not engage in alcohol and or illegal drug use while on prescription medicines. In the event of worsening symptoms, patient is instructed to call the crisis hotline, 911 and or go to the nearest ED for appropriate evaluation and treatment of symptoms. To follow-up with his/her primary care provider for your other medical issues, concerns and or health care needs.   Signed: Armandina Stammer, NP, PMHNP, FNP-BC 02/09/2018, 9:58 AM   Patient seen, Suicide Assessment Completed.  Disposition Plan Reviewed

## 2018-02-09 NOTE — Progress Notes (Signed)
  [facility] Adult Case Management Discharge Plan :  Will you be returning to the same living situation after discharge:  No.Pt plans to [facility].  At discharge, do you have transportation home?: Yes,  brother. pt scheduled for Saturday discharge 10/19 Do you have the ability to pay for your medications: Yes,  mental health  Release of information consent forms completed and submitted to medical records by CSW.   Patient to Follow up at: Follow-up Information    [facility]. Go on 02/11/2018.   Specialty:  Behavioral Health Why:  Appointment for medication management and therapy services is Wednesday, 02/11/18 at 8:00am. Please be sure to bring your Photo ID, any insurance information and any dicharge paperwork including your list of medications.  Contact information: [address] [place] [address] [phone]           Next level of care provider has access to [facility] Link:no  Safety Planning and Suicide Prevention discussed: Yes,  SPE completed with pt and his girlfriend. SPI pamphlet and Mobile Crisis information provided.   Have you used any form of tobacco in the last 30 days? (Cigarettes, Smokeless Tobacco, Cigars, and/or Pipes): Yes  Has patient been referred to the Quitline?: Patient refused referral  Patient has been referred for addiction treatment: Yes  Rona Ravens, LCSW 02/09/2018, 10:51 AM

## 2018-02-09 NOTE — Progress Notes (Signed)
D.  Pt pleasant on approach, compliant of tooth pain.  Pt had been receiving Tylenol with very little relief throughout day.  Pt was positive for evening AA group but did leave halfway through.  Pt observed engaged in appropriate interaction with peers on the unit.  Pt denies SI/HI/AVH at this time.  A.  PA notified of tooth pain and new order received.  Support and encouragement offered to Pt, medication given as ordered. R.  Pt pleased with new order for tooth pain, remains safe on the unit.

## 2018-03-05 ENCOUNTER — Emergency Department (HOSPITAL_COMMUNITY)
Admission: EM | Admit: 2018-03-05 | Discharge: 2018-03-06 | Disposition: A | Discharge status: Home / Self Care | Payer: Self-pay | Attending: Emergency Medicine | Admitting: Emergency Medicine

## 2018-03-05 ENCOUNTER — Other Ambulatory Visit: Payer: Self-pay

## 2018-03-05 DIAGNOSIS — J4521 Mild intermittent asthma with (acute) exacerbation: Secondary | ICD-10-CM | POA: Insufficient documentation

## 2018-03-05 DIAGNOSIS — Z79899 Other long term (current) drug therapy: Secondary | ICD-10-CM | POA: Insufficient documentation

## 2018-03-05 DIAGNOSIS — F32A Depression, unspecified: Secondary | ICD-10-CM

## 2018-03-05 DIAGNOSIS — F329 Major depressive disorder, single episode, unspecified: Secondary | ICD-10-CM

## 2018-03-05 DIAGNOSIS — F332 Major depressive disorder, recurrent severe without psychotic features: Secondary | ICD-10-CM | POA: Insufficient documentation

## 2018-03-05 DIAGNOSIS — J45901 Unspecified asthma with (acute) exacerbation: Secondary | ICD-10-CM

## 2018-03-05 DIAGNOSIS — F17210 Nicotine dependence, cigarettes, uncomplicated: Secondary | ICD-10-CM | POA: Insufficient documentation

## 2018-03-05 DIAGNOSIS — R45851 Suicidal ideations: Secondary | ICD-10-CM | POA: Insufficient documentation

## 2018-03-05 LAB — COMPREHENSIVE METABOLIC PANEL
ALT: 23 U/L (ref 0–44)
AST: 22 U/L (ref 15–41)
Albumin: 4.2 g/dL (ref 3.5–5.0)
Alkaline Phosphatase: 48 U/L (ref 38–126)
Anion gap: 10 (ref 5–15)
BUN: 20 mg/dL (ref 6–20)
CO2: 22 mmol/L (ref 22–32)
Calcium: 8.7 mg/dL — ABNORMAL LOW (ref 8.9–10.3)
Chloride: 105 mmol/L (ref 98–111)
Creatinine, Ser: 1.01 mg/dL (ref 0.61–1.24)
GFR calc Af Amer: >60 mL/min (ref >60)
GFR calc non Af Amer: >60 mL/min (ref >60)
Glucose, Bld: 115 mg/dL — ABNORMAL HIGH (ref 70–99)
Potassium: 3.6 mmol/L (ref 3.5–5.1)
Sodium: 137 mmol/L (ref 135–145)
Total Bilirubin: 0.7 mg/dL (ref 0.3–1.2)
Total Protein: 7.2 g/dL (ref 6.5–8.1)

## 2018-03-05 LAB — CBC WITH DIFFERENTIAL/PLATELET
Abs Immature Granulocytes: 0.08 10*3/uL — ABNORMAL HIGH (ref 0.00–0.07)
Basophils Absolute: 0 10*3/uL (ref 0.0–0.1)
Basophils Relative: 0 %
Eosinophils Absolute: 0.1 10*3/uL (ref 0.0–0.5)
Eosinophils Relative: 1 %
HCT: 45.4 % (ref 39.0–52.0)
Hemoglobin: 15.4 g/dL (ref 13.0–17.0)
Immature Granulocytes: 1 %
Lymphocytes Relative: 14 %
Lymphs Abs: 1.6 10*3/uL (ref 0.7–4.0)
MCH: 31.4 pg (ref 26.0–34.0)
MCHC: 33.9 g/dL (ref 30.0–36.0)
MCV: 92.5 fL (ref 80.0–100.0)
Monocytes Absolute: 0.4 10*3/uL (ref 0.1–1.0)
Monocytes Relative: 4 %
Neutro Abs: 9.6 10*3/uL — ABNORMAL HIGH (ref 1.7–7.7)
Neutrophils Relative %: 80 %
Platelets: 296 10*3/uL (ref 150–400)
RBC: 4.91 MIL/uL (ref 4.22–5.81)
RDW: 12.6 % (ref 11.5–15.5)
WBC: 11.9 10*3/uL — ABNORMAL HIGH (ref 4.0–10.5)
nRBC: 0 % (ref 0.0–0.2)

## 2018-03-05 LAB — ETHANOL: Alcohol, Ethyl (B): <10 mg/dL (ref <10)

## 2018-03-05 LAB — RAPID URINE DRUG SCREEN, HOSP PERFORMED
Amphetamines: NOT DETECTED
Barbiturates: NOT DETECTED
Benzodiazepines: NOT DETECTED
Cocaine: NOT DETECTED
Opiates: NOT DETECTED
Tetrahydrocannabinol: POSITIVE — AB

## 2018-03-05 MED ORDER — OLANZAPINE 2.5 MG PO TABS
2.5000 mg | ORAL_TABLET | Freq: Every day | ORAL | Status: DC
  Administered 2018-03-05: 2.5 mg via ORAL
  Filled 2018-03-05: qty 1

## 2018-03-05 MED ORDER — FLUTICASONE PROPIONATE HFA 44 MCG/ACT IN AERO
2.0000 | INHALATION_SPRAY | Freq: Two times a day (BID) | RESPIRATORY_TRACT | Status: DC
  Administered 2018-03-05 – 2018-03-06 (×2): 2 via RESPIRATORY_TRACT
  Filled 2018-03-05: qty 10.6

## 2018-03-05 MED ORDER — PANTOPRAZOLE SODIUM 40 MG PO TBEC
40.0000 mg | DELAYED_RELEASE_TABLET | Freq: Every day | ORAL | Status: DC
  Administered 2018-03-05 – 2018-03-06 (×2): 40 mg via ORAL
  Filled 2018-03-05 (×2): qty 1

## 2018-03-05 MED ORDER — OLANZAPINE 2.5 MG PO TABS
2.5000 mg | ORAL_TABLET | Freq: Once | ORAL | Status: AC
  Administered 2018-03-05: 2.5 mg via ORAL
  Filled 2018-03-05: qty 1

## 2018-03-05 MED ORDER — ALBUTEROL SULFATE HFA 108 (90 BASE) MCG/ACT IN AERS
1.0000 | INHALATION_SPRAY | RESPIRATORY_TRACT | Status: DC | PRN
  Administered 2018-03-05: 2 via RESPIRATORY_TRACT
  Filled 2018-03-05: qty 6.7

## 2018-03-05 MED ORDER — NICOTINE 21 MG/24HR TD PT24
21.0000 mg | MEDICATED_PATCH | Freq: Every day | TRANSDERMAL | Status: DC
  Administered 2018-03-05 – 2018-03-06 (×2): 21 mg via TRANSDERMAL
  Filled 2018-03-05 (×2): qty 1

## 2018-03-05 MED ORDER — MIRTAZAPINE 30 MG PO TABS
30.0000 mg | ORAL_TABLET | Freq: Once | ORAL | Status: AC
  Administered 2018-03-05: 30 mg via ORAL
  Filled 2018-03-05: qty 1

## 2018-03-05 MED ORDER — ALBUTEROL SULFATE HFA 108 (90 BASE) MCG/ACT IN AERS: 1.0000 | INHALATION_SPRAY | Freq: Four times a day (QID) | RESPIRATORY_TRACT | 0 refills | Status: DC | PRN

## 2018-03-05 MED ORDER — GABAPENTIN 400 MG PO CAPS
400.0000 mg | ORAL_CAPSULE | Freq: Once | ORAL | Status: AC
  Administered 2018-03-05: 400 mg via ORAL
  Filled 2018-03-05: qty 1

## 2018-03-05 MED ORDER — PREDNISONE 20 MG PO TABS: 40.0000 mg | ORAL_TABLET | Freq: Every day | ORAL | 0 refills | Status: DC

## 2018-03-05 MED ORDER — ONDANSETRON HCL 4 MG PO TABS: 4.0000 mg | ORAL_TABLET | Freq: Three times a day (TID) | ORAL | Status: DC | PRN

## 2018-03-05 MED ORDER — NICOTINE 21 MG/24HR TD PT24: 21.0000 mg | MEDICATED_PATCH | Freq: Every day | TRANSDERMAL | Status: DC

## 2018-03-05 MED ORDER — GABAPENTIN 400 MG PO CAPS
400.0000 mg | ORAL_CAPSULE | Freq: Three times a day (TID) | ORAL | Status: DC
  Administered 2018-03-05 – 2018-03-06 (×2): 400 mg via ORAL
  Filled 2018-03-05 (×2): qty 1

## 2018-03-05 MED ORDER — LORAZEPAM 2 MG/ML IJ SOLN
1.0000 mg | Freq: Once | INTRAMUSCULAR | Status: AC
  Administered 2018-03-05: 1 mg via INTRAVENOUS
  Filled 2018-03-05: qty 1

## 2018-03-05 MED ORDER — HYDROXYZINE HCL 25 MG PO TABS
50.0000 mg | ORAL_TABLET | Freq: Three times a day (TID) | ORAL | Status: DC | PRN
  Administered 2018-03-05: 50 mg via ORAL
  Filled 2018-03-05: qty 2

## 2018-03-05 MED ORDER — MIRTAZAPINE 30 MG PO TABS
30.0000 mg | ORAL_TABLET | Freq: Every day | ORAL | Status: DC
  Administered 2018-03-05: 30 mg via ORAL
  Filled 2018-03-05: qty 1

## 2018-03-05 NOTE — ED Notes (Signed)
Reports that his wife left him about a month ago and he is homeless now. He has been off of his medications and is feeling suicidal with plan to hang himself. On admission to the Acute Unit pt is calm and cooperate. Given a ham sandwich and drink.

## 2018-03-05 NOTE — BH Assessment (Signed)
[facility]   Per Roosvelt HarpsJackie Norman, DO and Elta GuadeloupeLaurie Parks, NP, patient will be observed and monitored overnight for safety and reassessed in the morning.  Patient medications will be re-started while he is in the ED.

## 2018-03-05 NOTE — ED Provider Notes (Signed)
[facility]-EMERGENCY DEPT Provider Note   CSN: [id number] Arrival date & time: 03/05/18  0944     History   Chief Complaint No chief complaint on file.   HPI Devin [name] is a 37 y.o. male.  HPI   37 year old male with anxiety and shortness of breath.  He has a past history of asthma.  He is currently out of his inhaler.  Reports increased wheezing over the past couple days.  Received 50 mg albuterol, 1 mg of Atrovent and 125 mg of Solu-Medrol prior to arrival.  He states that his breathing feels better although he is increasingly anxious.  This is actually more his primary concern.  He states that "I need to get my head right."  Reports increasing anxiety & depression.  Reports that he was recently admitted for psychiatric reasons.  He was feeling better at that time but when he went home his girlfriend subsequently left him.  He is now also out of his psychiatric medications.  He states that he is feeling better when he was on them.  He has had some thoughts of wanting to kill himself but no discrete plan.  Denies any hallucinations or homicidal ideation.  Past Medical History:  Diagnosis Date  . ADHD (attention deficit hyperactivity disorder)   . Asthma   . GERD (gastroesophageal reflux disease)   . Transaminitis 07/17/2015    Patient Active Problem List   Diagnosis Date Noted  . 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 Bipolar II disorder (HCC) 01/31/2018  . Cocaine abuse with cocaine-induced mood disorder (HCC) 03/22/2017  . Hypoxia   . COPD exacerbation (HCC)   . Leukocytosis 11/04/2015  . Transaminitis 07/17/2015  . Tooth ache 07/17/2015  . Asthma with status asthmaticus 10/14/2011  . ADHD (attention deficit hyperactivity disorder) 10/14/2011    Past Surgical History:  Procedure Laterality Date  . TYMPANOPLASTY Left 1997        Home Medications    Prior to Admission medications     Medication Sig Start Date End Date Taking? Authorizing Provider  albuterol (PROVENTIL HFA;VENTOLIN HFA) 108 (90 Base) MCG/ACT inhaler Inhale 1-2 puffs into the lungs every 4 (four) hours as needed for wheezing or shortness of breath. 02/09/18  Yes Armandina Stammer I, NP  fluticasone (FLOVENT HFA) 44 MCG/ACT inhaler Inhale 2 puffs into the lungs 2 (two) times daily. For asthma 02/09/18  Yes Armandina Stammer I, NP  gabapentin (NEURONTIN) 400 MG capsule Take 1 capsule (400 mg total) by mouth 3 (three) times daily. For agitation 02/09/18  Yes Armandina Stammer I, NP  hydrOXYzine (ATARAX/VISTARIL) 25 MG tablet Take 1 tablet (25 mg) by mouth three times daily & 2 tablets (50 mg) at bedtime for anxiety/sleep 02/09/18  Yes Nwoko, Nicole Kindred I, NP  Melatonin 3 MG TABS Take 1 tablet (3 mg total) by mouth at bedtime. For sleep 02/09/18  Yes Armandina Stammer I, NP  mirtazapine (REMERON) 30 MG tablet Take 1 tablet (30 mg total) by mouth at bedtime. For depression 02/09/18  Yes Nwoko, Nicole Kindred I, NP  nicotine (NICODERM CQ - DOSED IN MG/24 HOURS) 21 mg/24hr patch Place 1 patch (21 mg total) onto the skin daily. (May buy from over the counter): For smoking cessation 02/10/18  Yes Nwoko, Nicole Kindred I, NP  OLANZapine (ZYPREXA) 2.5 MG tablet Take 1 tablet (2.5 mg total) by mouth at bedtime. For mood control 02/09/18  Yes Nwoko, Nicole Kindred I, NP  pantoprazole (PROTONIX) 40 MG tablet Take  1 tablet (40 mg total) by mouth daily. For acid reflux 02/10/18  Yes Nwoko, Nicole Kindred I, NP  benzocaine (ORAJEL) 10 % mucosal gel Use as directed in the mouth or throat 4 (four) times daily as needed for mouth pain. Patient not taking: Reported on 03/05/2018 02/09/18   Armandina Stammer I, NP  fluticasone (FLONASE) 50 MCG/ACT nasal spray Place 1 spray into both nostrils daily. For allergies Patient not taking: Reported on 03/05/2018 02/09/18   Sanjuana Kava, NP    Family History Family History  Problem Relation Age of Onset  . Coronary artery disease Father   . Heart  attack Father   . Heart disease Father   . Heart failure Mother   . Hyperlipidemia Mother   . Hypertension Mother   . Migraines Mother   . COPD Mother   . Diabetes Maternal Aunt   . Asthma Maternal Grandfather   . Asthma Paternal Grandfather     Social History Social History   Tobacco Use  . Smoking status: Current Every Day Smoker    Packs/day: 0.50    Types: Cigarettes  . Smokeless tobacco: Never Used  Substance Use Topics  . Alcohol use: Yes    Alcohol/week: 0.0 standard drinks  . Drug use: Yes    Types: Marijuana, Cocaine    Comment:                       Allergies   Augmentin [amoxicillin-pot clavulanate] and Other   Review of Systems Review of Systems  All systems reviewed and negative, other than as noted in HPI.  Physical Exam Updated Vital Signs BP 130/82 (BP Location: Right Arm)   Pulse 81   Ht 5\' 9"  (1.753 m)   Wt 87.5 kg   SpO2 100%   BMI 28.50 kg/m    Physical Exam  Constitutional: He is oriented to person, place, and time. He appears well-developed and well-nourished. No distress.  HENT:  Head: Normocephalic and atraumatic.  Eyes: Conjunctivae are normal. Right eye exhibits no discharge. Left eye exhibits no discharge.  Neck: Neck supple.  Cardiovascular: Normal rate, regular rhythm and normal heart sounds. Exam reveals no gallop and no friction rub.  No murmur heard. Pulmonary/Chest: Effort normal and breath sounds normal. No respiratory distress.  Abdominal: Soft. He exhibits no distension. There is no tenderness.  Musculoskeletal: He exhibits no edema or tenderness.  Neurological: He is alert and oriented to person, place, and time. No cranial nerve deficit. He exhibits normal muscle tone. Coordination normal.  Skin: Skin is warm and dry.  Psychiatric:  Speech clear.  Content appropriate.  Appears very anxious.  Crying at times.  Nursing note and vitals reviewed.    ED Treatments / Results  Labs (all labs ordered are listed, but  only abnormal results are displayed) Labs Reviewed  COMPREHENSIVE METABOLIC PANEL - Abnormal; Notable for the following components:      Result Value   Glucose, Bld 115 (*)    Calcium 8.7 (*)    All other components within normal limits  RAPID URINE DRUG SCREEN, HOSP PERFORMED - Abnormal; Notable for the following components:   Tetrahydrocannabinol POSITIVE (*)    All other components within normal limits  CBC WITH DIFFERENTIAL/PLATELET - Abnormal; Notable for the following components:   WBC 11.9 (*)    Neutro Abs 9.6 (*)    Abs Immature Granulocytes 0.08 (*)    All other components within normal limits  ETHANOL  EKG None  Radiology No results found.  Procedures Procedures (including critical care time)  Medications Ordered in ED Medications  LORazepam (ATIVAN) injection 1 mg (has no administration in time range)  gabapentin (NEURONTIN) capsule 400 mg (has no administration in time range)  mirtazapine (REMERON) tablet 30 mg (has no administration in time range)  OLANZapine (ZYPREXA) tablet 2.5 mg (has no administration in time range)  ondansetron (ZOFRAN) tablet 4 mg (has no administration in time range)  nicotine (NICODERM CQ - dosed in mg/24 hours) patch 21 mg (has no administration in time range)     Initial Impression / Assessment and Plan / ED Course  I have reviewed the triage vital signs and the nursing notes.  Pertinent labs & imaging results that were available during my care of the patient were reviewed by me and considered in my medical decision making (see chart for details).     37 year old male with anxiety and shortness of breath.  He now sounds clear on my exam after albuterol, Atrovent he also received steroids prehospital.  He is very anxious and tearful at times.  Reports increasing depression with suicidal thoughts without a clear plan.  Acute stressors include his girlfriend being pregnant and recently leaving him.  Will medically clear.  TTS  evaluation.  In terms of his wheezing, plan continued steroids for a few days and also discharged home with an albuterol inhaler.  Final Clinical Impressions(s) / ED Diagnoses   Final diagnoses:  Depression, unspecified depression type  Suicidal thoughts  Mild asthma with exacerbation, unspecified whether persistent    ED Discharge Orders    None      Raeford RazorKohut, Shela Esses, MD 03/06/18 1255

## 2018-03-05 NOTE — BH Assessment (Signed)
Assessment Note  Devin [name] is an 37 y.o. male who presented to [facility] stating that he was suicidal with a plan to hang himself.  Patient states that he has two prior suicide attempts, one by overdose and the other by trying to shoot himself.  Patient states that he was just a patient in [facility] a month ago.  Patient states that he was clean for nine months and relapsed and became suicidal and was hospitalized at [facility].  While he was in the hospital, he states that his pregnant girlfriend left him.  He states that when he was released that they tried to work on things and she said that she was coming back and she did, but just to get the rest of her things.  Patient states that he has been struggling with this loss.  He states that he also did not have the money to get his medications filled so he has been off his medications which has contributed to his depression. He states that he has been clean from cocaine since his discharge from [facility], but admits to daily marijuana use. Patient states that he is not sleeping and he has no appetite.  Patient denies HI/Psychosis. 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  He states that he has no current legal issues.  Patient states that he just became homeless on Monday because he was staying in an apartment with his girlfriend, but it was part of a housing program for her and now that she is gone, he can no longer stay there.  Patient presented as oriented and alert, his thoughts organized and his memory intact.  His judgment, insight and impulse control appear to be impaired.  His mood and affect are depressed.  Patient's psycho-motor activity was normal.  He did not appear to be responding to any internal stimuli.   Diagnosis: F33.2 MDD Recurrent Severe without Psychotic Features  Past Medical History:  Past Medical History:  Diagnosis Date  . ADHD (attention deficit hyperactivity disorder)   .  Asthma   . GERD (gastroesophageal reflux disease)   . Transaminitis 07/17/2015    Past Surgical History:  Procedure Laterality Date  . TYMPANOPLASTY Left 1997    Family History:  Family History  Problem Relation Age of Onset  . Coronary artery disease Father   . Heart attack Father   . Heart disease Father   . Heart failure Mother   . Hyperlipidemia Mother   . Hypertension Mother   . Migraines Mother   . COPD Mother   . Diabetes Maternal Aunt   . Asthma Maternal Grandfather   . Asthma Paternal Grandfather     Social History:  reports that he has been smoking cigarettes. He has been smoking about 0.50 packs per day. He has never used smokeless tobacco. He reports that he drinks alcohol. He reports that he has current or past drug history. Drugs: Marijuana and Cocaine.  Additional Social History:  Alcohol / Drug Use Pain Medications: see MAR Prescriptions: see MAR Over the Counter: see MAR History of alcohol / drug use?: Yes Longest period of sobriety (when/how long): patient states that he has been clean from cocaine for the past month Negative Consequences of Use: Financial, Legal, Personal relationships, Work / School Substance #1 Name of Substance 1: cocaine 1 - Age of First Use: unsure 1 - Amount (size/oz): $200-300 1 - Frequency: daily 1 - Duration: since onset 1 -  Last Use / Amount: one month ago Substance #2 Name of Substance 2: Marijuana 2 - Age of First Use: 15 2 - Amount (size/oz): 2-3 grams daily 2 - Frequency: daily 2 - Duration: since onset 2 - Last Use / Amount: 2 days ago  CIWA: CIWA-Ar BP: 121/89 Pulse Rate: 87 COWS:    Allergies:  Allergies  Allergen Reactions  . Augmentin [Amoxicillin-Pot Clavulanate] Hives    Tolerates Amoxicillin - Thuy 09/29/12  . Other Nausea And Vomiting    coconut    Home Medications:  (Not in a hospital admission)  OB/GYN Status:  No LMP for male patient.  General Assessment Data Location of Assessment: [facility]  ED TTS Assessment: In system Is this a Tele or Face-to-Face Assessment?: Face-to-Face Is this an Initial Assessment or a Re-assessment for this encounter?: Initial Assessment Patient Accompanied by:: N/A Language Other than English: No Living Arrangements: Homeless/Shelter What gender do you identify as?: Male Marital status: Single Living Arrangements: Alone Can pt return to current living arrangement?: Yes Admission Status: Voluntary Is patient capable of signing voluntary admission?: Yes Referral Source: Self/Family/Friend Insurance type: (self-pay)     Crisis Care Plan Living Arrangements: Alone Legal Guardian: Other:(self) Name of Psychiatrist: none Name of Therapist: none  Education Status Is patient currently in school?: No Is the patient employed, unemployed or receiving disability?: Unemployed  Risk to self with the past 6 months Suicidal Ideation: Yes-Currently Present Has patient been a risk to self within the past 6 months prior to admission? : Yes Suicidal Intent: No(pt came for help) Has patient had any suicidal intent within the past 6 months prior to admission? : No Is patient at risk for suicide?: Yes Suicidal Plan?: Yes-Currently Present Has patient had any suicidal plan within the past 6 months prior to admission? : No Specify Current Suicidal Plan: hang self Access to Means: Yes Specify Access to Suicidal Means: (Rope) What has been your use of drugs/alcohol within the last 12 months?: (daily THC use, clean from cocaine for 1 month) Previous Attempts/Gestures: Yes How many times?: 2(overdosed and tried to shoot self) Other Self Harm Risks: (none) Triggers for Past Attempts: Unknown Intentional Self Injurious Behavior: None Family Suicide History: No Recent stressful life event(s): Job Loss, Other (Comment)(homeless) Persecutory voices/beliefs?: No Depression: Yes Depression Symptoms: Despondent, Insomnia, Isolating, Fatigue, Loss of interest in  usual pleasures, Feeling worthless/self pity Substance abuse history and/or treatment for substance abuse?: Yes Suicide prevention information given to non-admitted patients: Yes  Risk to Others within the past 6 months Homicidal Ideation: No Does patient have any lifetime risk of violence toward others beyond the six months prior to admission? : No Thoughts of Harm to Others: No Current Homicidal Intent: No Current Homicidal Plan: No Access to Homicidal Means: No Identified Victim: none History of harm to others?: No Assessment of Violence: None Noted Violent Behavior Description: NA Does patient have access to weapons?: No Criminal Charges Pending?: No Does patient have a court date: No Is patient on probation?: No  Psychosis Hallucinations: None noted Delusions: None noted  Mental Status Report Appearance/Hygiene: Unremarkable Eye Contact: Good Motor Activity: Freedom of movement Speech: Logical/coherent Level of Consciousness: Alert Mood: Depressed Affect: Appropriate to circumstance Anxiety Level: Minimal Thought Processes: Coherent, Relevant Judgement: Impaired Orientation: Person, Place, Time, Situation Obsessive Compulsive Thoughts/Behaviors: None  Cognitive Functioning Concentration: Good Memory: Recent Intact, Remote Intact Is patient IDD: No Insight: Fair Impulse Control: Poor Appetite: Poor Have you had any weight changes? : No Change Sleep: Decreased  Total Hours of Sleep: 2 Vegetative Symptoms: Decreased grooming  ADLScreening [facility] Assessment Services) Patient's cognitive ability adequate to safely complete daily activities?: Yes Patient able to express need for assistance with ADLs?: Yes Independently performs ADLs?: Yes (appropriate for developmental age)  Prior Inpatient Therapy Prior Inpatient Therapy: Yes Prior Therapy Dates: 2019 Prior Therapy Facilty/Provider(s): [facility] Reason for Treatment: depression  Prior Outpatient Therapy Prior  Outpatient Therapy: Yes Prior Therapy Dates: 2017 Prior Therapy Facilty/Provider(s): [facility] Reason for Treatment: Med Management Does patient have an ACCT team?: No Does patient have Intensive In-House Services?  : No Does patient have [facility] services? : Yes Does patient have P4CC services?: No  ADL Screening (condition at time of admission) Patient's cognitive ability adequate to safely complete daily activities?: Yes Is the patient deaf or have difficulty hearing?: No Does the patient have difficulty seeing, even when wearing glasses/contacts?: No Does the patient have difficulty concentrating, remembering, or making decisions?: No Patient able to express need for assistance with ADLs?: Yes Does the patient have difficulty dressing or bathing?: No Independently performs ADLs?: Yes (appropriate for developmental age) Does the patient have difficulty walking or climbing stairs?: No Weakness of Legs: None Weakness of Arms/Hands: None  Home Assistive Devices/Equipment Home Assistive Devices/Equipment: None  Therapy Consults (therapy consults require a physician order) PT Evaluation Needed: No OT Evalulation Needed: No SLP Evaluation Needed: No Abuse/Neglect Assessment (Assessment to be complete while patient is alone) Abuse/Neglect Assessment Can Be Completed: Yes Physical Abuse: Denies Verbal Abuse: Denies Sexual Abuse: Denies Exploitation of patient/patient's resources: Denies Self-Neglect: Denies Values / Beliefs Cultural Requests During Hospitalization: None Spiritual Requests During Hospitalization: None Consults Spiritual Care Consult Needed: No Social Work Consult Needed: No Merchant navy officer (For Healthcare) Does Patient Have a Medical Advance Directive?: No Would patient like information on creating a medical advance directive?: No - Patient declined Nutrition Screen- [facility] Adult/[facility] Has the patient recently lost weight without trying?: No Has the patient been  eating poorly because of a decreased appetite?: Yes Malnutrition Screening Tool Score: 1        Disposition:  Per Roosvelt Harps, DO and Elta Guadeloupe, NP, patient will be observed and monitored overnight for safety and reassessed in the morning.  Patient medications will be re-started while he is in the ED.  Disposition Initial Assessment Completed for this Encounter: Yes Disposition of Patient: (Overnight OBS)  On Site Evaluation by:   Reviewed with Physician:    Arnoldo Lenis Rhyann Berton 03/05/2018 1:14 PM

## 2018-03-05 NOTE — ED Triage Notes (Signed)
Per EMS-asthma attack, out of his inhalers-same symptoms occurred 1 month ago-homeless-15 mg of albuterol and 1 mg of Atrovent given in route-improvement from treatments-125 mg of solumedrol given as well

## 2018-03-05 NOTE — ED Notes (Signed)
[id number] Expected date:  Expected time:  Means of arrival:  Comments: Hold for 23

## 2018-03-05 NOTE — ED Notes (Signed)
Knife given to security.  

## 2018-03-06 ENCOUNTER — Emergency Department (HOSPITAL_COMMUNITY)
Admission: EM | Admit: 2018-03-06 | Discharge: 2018-03-07 | Disposition: A | Discharge status: Home / Self Care | Payer: Self-pay | Attending: Emergency Medicine | Admitting: Emergency Medicine

## 2018-03-06 ENCOUNTER — Encounter (HOSPITAL_COMMUNITY): Payer: Self-pay | Admitting: Emergency Medicine

## 2018-03-06 DIAGNOSIS — F1410 Cocaine abuse, uncomplicated: Secondary | ICD-10-CM | POA: Insufficient documentation

## 2018-03-06 DIAGNOSIS — R45851 Suicidal ideations: Secondary | ICD-10-CM | POA: Insufficient documentation

## 2018-03-06 DIAGNOSIS — F329 Major depressive disorder, single episode, unspecified: Secondary | ICD-10-CM

## 2018-03-06 DIAGNOSIS — F17210 Nicotine dependence, cigarettes, uncomplicated: Secondary | ICD-10-CM | POA: Insufficient documentation

## 2018-03-06 DIAGNOSIS — Z79899 Other long term (current) drug therapy: Secondary | ICD-10-CM | POA: Insufficient documentation

## 2018-03-06 DIAGNOSIS — J449 Chronic obstructive pulmonary disease, unspecified: Secondary | ICD-10-CM | POA: Insufficient documentation

## 2018-03-06 DIAGNOSIS — F1994 Other psychoactive substance use, unspecified with psychoactive substance-induced mood disorder: Secondary | ICD-10-CM | POA: Insufficient documentation

## 2018-03-06 DIAGNOSIS — F32A Depression, unspecified: Secondary | ICD-10-CM | POA: Insufficient documentation

## 2018-03-06 DIAGNOSIS — F909 Attention-deficit hyperactivity disorder, unspecified type: Secondary | ICD-10-CM | POA: Insufficient documentation

## 2018-03-06 DIAGNOSIS — J45909 Unspecified asthma, uncomplicated: Secondary | ICD-10-CM | POA: Insufficient documentation

## 2018-03-06 LAB — RAPID URINE DRUG SCREEN, HOSP PERFORMED
AMPHETAMINES: NOT DETECTED
BARBITURATES: NOT DETECTED
Benzodiazepines: NOT DETECTED
Cocaine: NOT DETECTED
Opiates: NOT DETECTED
TETRAHYDROCANNABINOL: POSITIVE — AB

## 2018-03-06 MED ORDER — MIRTAZAPINE 30 MG PO TABS
30.0000 mg | ORAL_TABLET | Freq: Every day | ORAL | Status: DC
  Administered 2018-03-07: 30 mg via ORAL
  Filled 2018-03-06: qty 1

## 2018-03-06 MED ORDER — PREDNISONE 20 MG PO TABS
40.0000 mg | ORAL_TABLET | Freq: Once | ORAL | Status: AC
  Administered 2018-03-06: 40 mg via ORAL
  Filled 2018-03-06: qty 2

## 2018-03-06 MED ORDER — HYDROXYZINE HCL 25 MG PO TABS: 25.0000 mg | ORAL_TABLET | Freq: Three times a day (TID) | ORAL | Status: DC | PRN

## 2018-03-06 MED ORDER — OLANZAPINE 2.5 MG PO TABS
2.5000 mg | ORAL_TABLET | Freq: Every day | ORAL | Status: DC
  Administered 2018-03-07: 2.5 mg via ORAL
  Filled 2018-03-06: qty 1

## 2018-03-06 NOTE — ED Notes (Signed)
Patient endorsing passive SI but agrees to to contract for safety.

## 2018-03-06 NOTE — ED Notes (Signed)
Pt's knife given to security for safekeeping.

## 2018-03-06 NOTE — Consult Note (Addendum)
[facility] Psych ED Discharge  03/06/2018 12:17 PM Devin [name]  MRN:  [id number] Principal Problem: MDD (major depressive disorder), recurrent severe, without psychosis [facility]) Discharge Diagnoses:  Patient Active Problem List   Diagnosis Date Noted  . Depression [F32.9]   . MDD (major depressive disorder), recurrent severe, without psychosis (HCC) [F33.2] 01/31/2018  . Bipolar II disorder (HCC) [F31.81] 01/31/2018  . Cocaine abuse with cocaine-induced mood disorder (HCC) [F14.14] 03/22/2017  . Hypoxia [R09.02]   . COPD exacerbation (HCC) [J44.1]   . Leukocytosis [D72.829] 11/04/2015  . Transaminitis [R74.0] 07/17/2015  . Tooth ache [K08.89] 07/17/2015  . Asthma with status asthmaticus [J45.902] 10/14/2011  . ADHD (attention deficit hyperactivity disorder) [F90.9] 10/14/2011    Subjective: Pt was seen and chart reviewed with treatment team and Dr Sharma Covert. Pt denies homicidal ideation, denies auditory/visual hallucinations and does not appear to be responding to internal stimuli. Pt stated he started thinking of ending it all when his wife left him 2 weeks ago. He stated he has never attempted suicide before and since his wife left him he has nothing. He was in [facility] last month and was instructed to follow up at [facility] which he did not do. 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 Pt's difficulties are situational and he will be instructed to follow up with outpatient resources. He is requesting to speak with Peer Support for help with substance abuse treatment programs in the community. Pt's UDS positive for THC, BAL negative. Pt is psychiatrically clear for discharge.   Total Time spent with patient: 30 minutes  Past Psychiatric History: As above  Past Medical History:  Past Medical History:  Diagnosis Date  . ADHD (attention deficit hyperactivity disorder)   . Asthma   .  GERD (gastroesophageal reflux disease)   . Transaminitis 07/17/2015   Past Surgical History:  Procedure Laterality Date  . TYMPANOPLASTY Left 1997   Family History:  Family History  Problem Relation Age of Onset  . Coronary artery disease Father   . Heart attack Father   . Heart disease Father   . Heart failure Mother   . Hyperlipidemia Mother   . Hypertension Mother   . Migraines Mother   . COPD Mother   . Diabetes Maternal Aunt   . Asthma Maternal Grandfather   . Asthma Paternal Grandfather    Family Psychiatric  History: Per Pt, Mother Bipolar Social History:  Social History   Substance and Sexual Activity  Alcohol Use Yes  . Alcohol/week: 0.0 standard drinks    Social History   Substance and Sexual Activity  Drug Use Yes  . Types: Marijuana, Cocaine   Comment:                     Social History   Socioeconomic History  . Marital status: Single    Spouse name: Not on file  . Number of children: 2  . Years of education: 12th grade  . Highest education level: 12th grade  Occupational History  . 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 Financial resource strain: Hard  . Food insecurity:    Worry: Sometimes true    Inability: Often true  . Transportation needs:    Medical: Yes    Non-medical: Yes  Tobacco Use  . Smoking status: Current Every Day Smoker  Packs/day: 0.50    Types: Cigarettes  . Smokeless tobacco: Never Used  Substance and Sexual Activity  . Alcohol use: Yes    Alcohol/week: 0.0 standard drinks  . Drug use: Yes    Types: Marijuana, Cocaine    Comment:                    . Sexual activity: Not Currently  Lifestyle  . Physical activity:    Days per week: 0 days    Minutes per session: 0 min  . Stress: Very much  Relationships  . Social connections:    Talks on phone: Once a week    Gets together: Never    Attends religious service: Never    Active member of club or organization: No    Attends meetings of clubs or  organizations: Not on file    Relationship status: Divorced  Other Topics Concern  . Not on file  Social History Narrative  . Not on file    Has this patient used any form of tobacco in the last 30 days? (Cigarettes, Smokeless Tobacco, Cigars, and/or Pipes) Prescription not provided because: Pt declined  Current Medications: Current Facility-Administered Medications  Medication Dose Route Frequency Provider Last Rate Last Dose  . albuterol (PROVENTIL HFA;VENTOLIN HFA) 108 (90 Base) MCG/ACT inhaler 1-2 puff  1-2 puff Inhalation Q4H PRN Linwood Dibbles, MD   2 puff at 03/05/18 2014  . fluticasone (FLOVENT HFA) 44 MCG/ACT inhaler 2 puff  2 puff Inhalation BID Linwood Dibbles, MD   2 puff at 03/06/18 0931  . gabapentin (NEURONTIN) capsule 400 mg  400 mg Oral TID Linwood Dibbles, MD   400 mg at 03/06/18 9604  . hydrOXYzine (ATARAX/VISTARIL) tablet 50 mg  50 mg Oral TID PRN Linwood Dibbles, MD   50 mg at 03/05/18 2020  . mirtazapine (REMERON) tablet 30 mg  30 mg Oral QHS Linwood Dibbles, MD   30 mg at 03/05/18 2107  . nicotine (NICODERM CQ - dosed in mg/24 hours) patch 21 mg  21 mg Transdermal Daily [name], MD   21 mg at 03/06/18 0926  . OLANZapine (ZYPREXA) tablet 2.5 mg  2.5 mg Oral QHS Linwood Dibbles, MD   2.5 mg at 03/05/18 2107  . ondansetron (ZOFRAN) tablet 4 mg  4 mg Oral Q8H PRN [name], MD      . pantoprazole (PROTONIX) EC tablet 40 mg  40 mg Oral Daily Linwood Dibbles, MD   40 mg at 03/06/18 5409   Current Outpatient Medications  Medication Sig Dispense Refill  . albuterol (PROVENTIL HFA;VENTOLIN HFA) 108 (90 Base) MCG/ACT inhaler Inhale 1-2 puffs into the lungs every 4 (four) hours as needed for wheezing or shortness of breath.    . fluticasone (FLOVENT HFA) 44 MCG/ACT inhaler Inhale 2 puffs into the lungs 2 (two) times daily. For asthma 1 Inhaler 12  . gabapentin (NEURONTIN) 400 MG capsule Take 1 capsule (400 mg total) by mouth 3 (three) times daily. For agitation 90 capsule 0  . hydrOXYzine  (ATARAX/VISTARIL) 25 MG tablet Take 1 tablet (25 mg) by mouth three times daily & 2 tablets (50 mg) at bedtime for anxiety/sleep 90 tablet 0  . Melatonin 3 MG TABS Take 1 tablet (3 mg total) by mouth at bedtime. For sleep 30 tablet 0  . mirtazapine (REMERON) 30 MG tablet Take 1 tablet (30 mg total) by mouth at bedtime. For depression 30 tablet 0  . nicotine (NICODERM CQ - DOSED IN MG/24 HOURS)  21 mg/24hr patch Place 1 patch (21 mg total) onto the skin daily. (May buy from over the counter): For smoking cessation 28 patch 0  . OLANZapine (ZYPREXA) 2.5 MG tablet Take 1 tablet (2.5 mg total) by mouth at bedtime. For mood control 30 tablet 0  . pantoprazole (PROTONIX) 40 MG tablet Take 1 tablet (40 mg total) by mouth daily. For acid reflux 30 tablet 0  . albuterol (PROVENTIL HFA;VENTOLIN HFA) 108 (90 Base) MCG/ACT inhaler Inhale 1-2 puffs into the lungs every 6 (six) hours as needed for wheezing or shortness of breath. 1 Inhaler 0  . benzocaine (ORAJEL) 10 % mucosal gel Use as directed in the mouth or throat 4 (four) times daily as needed for mouth pain. (Patient not taking: Reported on 03/05/2018) 5.3 g 0  . fluticasone (FLONASE) 50 MCG/ACT nasal spray Place 1 spray into both nostrils daily. For allergies (Patient not taking: Reported on 03/05/2018) 16 g 2  . predniSONE (DELTASONE) 20 MG tablet Take 2 tablets (40 mg total) by mouth daily. 8 tablet 0    Musculoskeletal: Strength & Muscle Tone: within normal limits Gait & Station: normal Patient leans: N/A  Psychiatric Specialty Exam: Physical Exam  Review of Systems  Psychiatric/Behavioral: Positive for substance abuse and suicidal ideas.    Blood pressure 112/71, pulse 74, temperature 98.5 F (36.9 C), temperature source Oral, resp. rate 18, height 5\' 9"  (1.753 m), weight 87.5 kg, SpO2 95 %.Body mass index is 28.5 kg/m.  General Appearance: Casual  Eye Contact:  Fair  Speech:  Clear and Coherent  Volume:  Normal  Mood:  Depressed  Affect:   Congruent and Depressed  Thought Process:  Coherent, Goal Directed and Linear  Orientation:  Full (Time, Place, and Person)  Thought Content:  Logical  Suicidal Thoughts:  Yes.  without intent/plan  Homicidal Thoughts:  No  Memory:  Immediate;   Good Recent;   Good Remote;   Fair  Judgement:  Fair  Insight:  Fair  Psychomotor Activity:  Normal  Concentration:  Concentration: Good and Attention Span: Good  Recall:  Good  Fund of Knowledge:  Good  Language:  Good  Akathisia:  No  Handed:  Right  AIMS (if indicated):   N/A  Assets:  Communication Skills  ADL's:  Intact  Cognition:  WNL  Sleep:   N/A     Demographic Factors:  Male, Caucasian, Low socioeconomic status, Living alone and Unemployed  Loss Factors: Financial problems/change in socioeconomic status  Historical Factors: Family history of mental illness or substance abuse and Impulsivity  Risk Reduction Factors:   Sense of responsibility to family  Continued Clinical Symptoms:  Alcohol/Substance Abuse/Dependencies  Cognitive Features That Contribute To Risk:  Closed-mindedness    Suicide Risk:  Minimal: No identifiable suicidal ideation.  Patients presenting with no risk factors but with morbid ruminations; may be classified as minimal risk based on the severity of the depressive symptoms    Plan Of Care/Follow-up recommendations:  Activity:  as tolerated Diet:  Heart healthy  Disposition: MDD (major depressive disorder), recurrent severe, without psychosis  Take all medications as prescribed by your outpatient provider, see your discharge instructions. Keep all follow-up appointments as scheduled, make an appointment as soon as your are discharged with the outpatient provider listed in your discharge paperwork.  Do not consume alcohol or use illegal drugs while on prescription medications. Report any adverse effects from your medications to your primary care provider promptly.  In the event of recurrent  symptoms  or worsening symptoms, call 911, a crisis hotline, or go to the nearest emergency department for evaluation.   Impulsivity:  Take all medications as prescribed by your outpatient provider, see your discharge instructions. Keep all follow-up appointments as scheduled, make an appointment as soon as your are discharged with the outpatient provider listed in your discharge paperwork.  Do not consume alcohol or use illegal drugs while on prescription medications. Report any adverse effects from your medications to your primary care provider promptly.  In the event of recurrent symptoms or worsening symptoms, call 911, a crisis hotline, or go to the nearest emergency department for evaluation.    [name], NP 03/06/2018, 12:17 PM   Patient seen face-to-face for psychiatric evaluation, chart reviewed and case discussed with the physician extender and developed treatment plan. Reviewed the information documented and agree with the treatment plan.  Juanetta Beets, DO 03/06/18 12:53 PM

## 2018-03-06 NOTE — Progress Notes (Signed)
CSW consulted to speak with patient regarding homeless resources. CSW spoke with patient at bedside who reports his wife "kicked him out of the house". Patient requested he go across the street because he reports he has contacts over there that can get him information on [facility]. Per patient, [facility] would [facility] to help with his substance use. CSW aware [facility] has been consulted to meet with patient. CSW provided patient with a list of homeless shelters in the area, food resources and information [facility]. Patient states he has no further need for CSW at this time. Please reconsult if needs arise.  Archie BalboaMackenzie Irwin, LCSWA  Clinical Social Work Department  [facility]  [phone]

## 2018-03-06 NOTE — ED Notes (Signed)
Patient has been calm and cooperative during this shift. Encouragement and support provided and safety maintain. Q 15 min safety checks remain in place and video monitoring.

## 2018-03-06 NOTE — ED Triage Notes (Signed)
Patient here from home with complaints of suicidal and homicidal because "my wife left me". Hx of depression.

## 2018-03-06 NOTE — Progress Notes (Signed)
Per Donell SievertSpencer Simon, PA pt meets criteria for inpt treatment. Pt's nurse has been advised of the disposition. TTS called EDP to advise but did not get an answer. [facility] to review for possible admission.  Princess BruinsAquicha Clare Casto, MSW, LCSW Therapeutic Triage Specialist  [phone]

## 2018-03-06 NOTE — BH Assessment (Signed)
[facility]  Per Juanetta BeetsJacqueline Norman, DO, this pt does not require psychiatric hospitalization at this time.  [facility], and with referral information for area supportive services for the homeless.  These have been included in pt's discharge instructions.  Pt would also benefit from seeing Peer Support Specialists; they will be asked to speak to pt.  Pt's nurse, Lanora Manislizabeth, has been notified.  Doylene Canninghomas Arely Tinner, MA Triage Specialist [phone]

## 2018-03-06 NOTE — ED Notes (Signed)
Patient is alert and oriented x 4.  Presents with sad affect and depressed mood.  Reports suicidal thoughts but verbally contracts for safety during assessment.  Reports stressor as wife living him and causing him to be homeless.  Medications given as prescribed.  Routine safety checks maintained every 15 minutes and via security camera.  Verbalizes understanding of discharge instructions and recommendations.  Personal belongings returned to patient.  Patient is safe and ready for discharge.

## 2018-03-06 NOTE — ED Notes (Signed)
Bed: WA31 Expected date:  Expected time:  Means of arrival:  Comments: 

## 2018-03-06 NOTE — ED Provider Notes (Signed)
[facility]-EMERGENCY DEPT Provider Note   CSN: [id number] Arrival date & time: 03/06/18  1933     History   Chief Complaint Chief Complaint  Patient presents with  . Suicidal  . Homicidal  . Depression    HPI Devin [name] is a 37 y.o. male.  Patient c/o feeling depressed due to breakup with significant other a month ago. States is having thoughts of suicide. Pt denies specific plan or attempt at self harm. States he has been homeless since the breakup, and 'shelter is full, and friends got kicked out of the motel.' pt denies new stressor today or this week. Normal appetite. No wt loss. Denies acute physical illness. Denies etoh or substance abuse.     The history is provided by the patient.  Depression  Pertinent negatives include no chest pain, no abdominal pain, no headaches and no shortness of breath.    Past Medical History:  Diagnosis Date  . ADHD (attention deficit hyperactivity disorder)   . Asthma   . GERD (gastroesophageal reflux disease)   . Transaminitis 07/17/2015    Patient Active Problem List   Diagnosis Date Noted  . Depression   . 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 Bipolar II disorder (HCC) 01/31/2018  . Cocaine abuse with cocaine-induced mood disorder (HCC) 03/22/2017  . Hypoxia   . COPD exacerbation (HCC)   . Leukocytosis 11/04/2015  . Transaminitis 07/17/2015  . Tooth ache 07/17/2015  . Asthma with status asthmaticus 10/14/2011  . ADHD (attention deficit hyperactivity disorder) 10/14/2011    Past Surgical History:  Procedure Laterality Date  . TYMPANOPLASTY Left 1997        Home Medications    Prior to Admission medications   Medication Sig Start Date End Date Taking? Authorizing Provider  albuterol (PROVENTIL HFA;VENTOLIN HFA) 108 (90 Base) MCG/ACT inhaler Inhale 1-2 puffs into the lungs every 4 (four) hours as needed for wheezing or shortness of breath.  02/09/18   Armandina Stammer I, NP  albuterol (PROVENTIL HFA;VENTOLIN HFA) 108 (90 Base) MCG/ACT inhaler Inhale 1-2 puffs into the lungs every 6 (six) hours as needed for wheezing or shortness of breath. 03/05/18   [name], MD  benzocaine (ORAJEL) 10 % mucosal gel Use as directed in the mouth or throat 4 (four) times daily as needed for mouth pain. Patient not taking: Reported on 03/05/2018 02/09/18   Armandina Stammer I, NP  fluticasone (FLONASE) 50 MCG/ACT nasal spray Place 1 spray into both nostrils daily. For allergies Patient not taking: Reported on 03/05/2018 02/09/18   Armandina Stammer I, NP  fluticasone (FLOVENT HFA) 44 MCG/ACT inhaler Inhale 2 puffs into the lungs 2 (two) times daily. For asthma 02/09/18   Armandina Stammer I, NP  gabapentin (NEURONTIN) 400 MG capsule Take 1 capsule (400 mg total) by mouth 3 (three) times daily. For agitation 02/09/18   Armandina Stammer I, NP  hydrOXYzine (ATARAX/VISTARIL) 25 MG tablet Take 1 tablet (25 mg) by mouth three times daily & 2 tablets (50 mg) at bedtime for anxiety/sleep 02/09/18   Armandina Stammer I, NP  Melatonin 3 MG TABS Take 1 tablet (3 mg total) by mouth at bedtime. For sleep 02/09/18   Armandina Stammer I, NP  mirtazapine (REMERON) 30 MG tablet Take 1 tablet (30 mg total) by mouth at bedtime. For depression 02/09/18   Armandina Stammer I, NP  nicotine (NICODERM CQ - DOSED IN MG/24 HOURS) 21 mg/24hr patch Place 1 patch (21 mg total) onto  the skin daily. (May buy from over the counter): For smoking cessation 02/10/18   Armandina Stammer I, NP  OLANZapine (ZYPREXA) 2.5 MG tablet Take 1 tablet (2.5 mg total) by mouth at bedtime. For mood control 02/09/18   Armandina Stammer I, NP  pantoprazole (PROTONIX) 40 MG tablet Take 1 tablet (40 mg total) by mouth daily. For acid reflux 02/10/18   Armandina Stammer I, NP  predniSONE (DELTASONE) 20 MG tablet Take 2 tablets (40 mg total) by mouth daily. 03/05/18   [name], MD    Family History Family History  Problem Relation Age of Onset    . Coronary artery disease Father   . Heart attack Father   . Heart disease Father   . Heart failure Mother   . Hyperlipidemia Mother   . Hypertension Mother   . Migraines Mother   . COPD Mother   . Diabetes Maternal Aunt   . Asthma Maternal Grandfather   . Asthma Paternal Grandfather     Social History Social History   Tobacco Use  . Smoking status: Current Every Day Smoker    Packs/day: 0.50    Types: Cigarettes  . Smokeless tobacco: Never Used  Substance Use Topics  . Alcohol use: Yes    Alcohol/week: 0.0 standard drinks  . Drug use: Yes    Types: Marijuana, Cocaine    Comment:                       Allergies   Augmentin [amoxicillin-pot clavulanate] and Other   Review of Systems Review of Systems  Constitutional: Negative for fever.  HENT: Negative for sore throat.   Eyes: Negative for redness.  Respiratory: Negative for shortness of breath.   Cardiovascular: Negative for chest pain.  Gastrointestinal: Negative for abdominal pain.  Genitourinary: Negative for flank pain.  Musculoskeletal: Negative for back pain and neck pain.  Skin: Negative for rash.  Neurological: Negative for headaches.  Hematological: Does not bruise/bleed easily.  Psychiatric/Behavioral: Positive for depression and dysphoric mood.     Physical Exam Updated Vital Signs BP (!) 143/95 (BP Location: Left Arm)   Pulse (!) 111   Temp 98.1 F (36.7 C) (Oral)   Resp 18   Ht 1.753 m (5\' 9" )   Wt 87.5 kg   SpO2 98%   BMI 28.50 kg/m   Physical Exam  Constitutional: He appears well-developed and well-nourished. No distress.  HENT:  Head: Atraumatic.  Eyes: Pupils are equal, round, and reactive to light.  Neck: Neck supple. No tracheal deviation present.  Cardiovascular: Normal rate.  Pulmonary/Chest: Effort normal. No accessory muscle usage. No respiratory distress.  Abdominal: He exhibits no distension. There is no tenderness.  Musculoskeletal: He exhibits no edema.   Neurological: He is alert.  Speech clear/fluent. Steady gait.  Skin: Skin is warm and dry.  Psychiatric:  Tearful. +SI.   Nursing note and vitals reviewed.    ED Treatments / Results  Labs (all labs ordered are listed, but only abnormal results are displayed) Results for orders placed or performed during the hospital encounter of 03/05/18  Comprehensive metabolic panel  Result Value Ref Range   Sodium 137 135 - 145 mmol/L   Potassium 3.6 3.5 - 5.1 mmol/L   Chloride 105 98 - 111 mmol/L   CO2 22 22 - 32 mmol/L   Glucose, Bld 115 (H) 70 - 99 mg/dL   BUN 20 6 - 20 mg/dL   Creatinine, Ser 1.61 0.61 - 1.24 mg/dL  Calcium 8.7 (L) 8.9 - 10.3 mg/dL   Total Protein 7.2 6.5 - 8.1 g/dL   Albumin 4.2 3.5 - 5.0 g/dL   AST 22 15 - 41 U/L   ALT 23 0 - 44 U/L   Alkaline Phosphatase 48 38 - 126 U/L   Total Bilirubin 0.7 0.3 - 1.2 mg/dL   GFR calc non Af Amer >60 >60 mL/min   GFR calc Af Amer >60 >60 mL/min   Anion gap 10 5 - 15  Ethanol  Result Value Ref Range   Alcohol, Ethyl (B) <10 <10 mg/dL  Urine rapid drug screen (hosp performed)  Result Value Ref Range   Opiates NONE DETECTED NONE DETECTED   Cocaine NONE DETECTED NONE DETECTED   Benzodiazepines NONE DETECTED NONE DETECTED   Amphetamines NONE DETECTED NONE DETECTED   Tetrahydrocannabinol POSITIVE (A) NONE DETECTED   Barbiturates NONE DETECTED NONE DETECTED  CBC with Diff  Result Value Ref Range   WBC 11.9 (H) 4.0 - 10.5 K/uL   RBC 4.91 4.22 - 5.81 MIL/uL   Hemoglobin 15.4 13.0 - 17.0 g/dL   HCT 16.145.4 09.639.0 - 04.552.0 %   MCV 92.5 80.0 - 100.0 fL   MCH 31.4 26.0 - 34.0 pg   MCHC 33.9 30.0 - 36.0 g/dL   RDW 40.912.6 81.111.5 - 91.415.5 %   Platelets 296 150 - 400 K/uL   nRBC 0.0 0.0 - 0.2 %   Neutrophils Relative % 80 %   Neutro Abs 9.6 (H) 1.7 - 7.7 K/uL   Lymphocytes Relative 14 %   Lymphs Abs 1.6 0.7 - 4.0 K/uL   Monocytes Relative 4 %   Monocytes Absolute 0.4 0.1 - 1.0 K/uL   Eosinophils Relative 1 %   Eosinophils Absolute 0.1  0.0 - 0.5 K/uL   Basophils Relative 0 %   Basophils Absolute 0.0 0.0 - 0.1 K/uL   Immature Granulocytes 1 %   Abs Immature Granulocytes 0.08 (H) 0.00 - 0.07 K/uL    EKG None  Radiology No results found.  Procedures Procedures (including critical care time)  Medications Ordered in ED Medications - No data to display   Initial Impression / Assessment and Plan / ED Course  I have reviewed the triage vital signs and the nursing notes.  Pertinent labs & imaging results that were available during my care of the patient were reviewed by me and considered in my medical decision making (see chart for details).  Reviewed nursing notes and prior charts for additional history.   Recent labs reviewed - unremarkable.   Reviewed nursing notes and prior charts for additional history.   Pt was evaluated this AM for similar symptoms - pt indicates feels worse, +SI. Will ask BH team to re-eval.   [facility]    Final Clinical Impressions(s) / ED Diagnoses   Final diagnoses:  None    ED Discharge Orders    None       Cathren LaineSteinl, Zamere Pasternak, MD 03/06/18 2040

## 2018-03-06 NOTE — BH Assessment (Addendum)
Assessment Note  Devin [name] is an 37 y.o. male who presents to the ED voluntarily c/o suicidal ideations with a plan to hang himself. Pt was recently assessed by TTS on 03/05/18 c/o similar concerns. Pt was d/c on 03/06/18 with instructions to f/u with [facility]. Pt states he attempted to get into [facility] and continued to get the run around. Pt stated this prompted his SI to return and he decided to come back to the ED.   Pt states he relapsed on cocaine after being sober for 8 months. Pt states he was sober until his pregnant wife left him in October. Pt endorses symptoms of depression including fatigue, low energy, feelings of worthlessness, crying spells, night terrors, inability to sleep, and hopelessness. Pt states he has been drinking alcohol heavily and also using marijuana daily. Pt states he does not know the reason his wife left him and he feels blind sided by her sudden change in behavior. Pt states he is currently unemployed and homeless. Pt reports he was working at [employer], however he recently quit his job because they wanted him to [employer].  Pt [facility] October 2019 after his wife first left him and he states it was helpful. Pt states ever since [facility] was [facility], he has been trying to get things together but he has been unsuccessful. Pt states he feels like giving up and he no longer has a desire to live. Pt is unable to contract for safety at this time and states if he is d/c from the ED, he will hang himself.    Per Donell SievertSpencer Simon, PA pt meets criteria for inpt treatment. Pt's nurse has been advised of the disposition. [facility] to review for possible admission.  Diagnosis: MDD, single episode, severe, w/o psychosis; Alcohol use disorder, severe; Cannabis use disorder, severe  Past Medical History:  Past Medical History:  Diagnosis Date  . ADHD (attention deficit hyperactivity disorder)   . Asthma   . GERD  (gastroesophageal reflux disease)   . Transaminitis 07/17/2015    Past Surgical History:  Procedure Laterality Date  . TYMPANOPLASTY Left 1997    Family History:  Family History  Problem Relation Age of Onset  . Coronary artery disease Father   . Heart attack Father   . Heart disease Father   . Heart failure Mother   . Hyperlipidemia Mother   . Hypertension Mother   . Migraines Mother   . COPD Mother   . Diabetes Maternal Aunt   . Asthma Maternal Grandfather   . Asthma Paternal Grandfather     Social History:  reports that he has been smoking cigarettes. He has been smoking about 0.50 packs per day. He has never used smokeless tobacco. He reports that he drinks alcohol. He reports that he has current or past drug history. Drugs: Marijuana and Cocaine.  Additional Social History:  Alcohol / Drug Use Pain Medications: see MAR Prescriptions: see MAR Over the Counter: see MAR History of alcohol / drug use?: Yes Longest period of sobriety (when/how long): patient states that he has been clean from cocaine for the past month Negative Consequences of Use: Financial, Legal, Personal relationships, Work / School Withdrawal Symptoms: Patient aware of relationship between substance abuse and physical/medical complications Substance #1 Name of Substance 1: cocaine 1 - Age of First Use: 20s 1 - Amount (size/oz): varies 1 - Frequency: daily 1 - Duration: years 1 - Last  Use / Amount: October 2019 Substance #2 Name of Substance 2: Cannabis 2 - Age of First Use: 15 2 - Amount (size/oz): excessive 2 - Frequency: daily 2 - Duration: ongoing 2 - Last Use / Amount: 03/03/18 Substance #3 Name of Substance 3: Alcohol 3 - Age of First Use: 15 3 - Amount (size/oz): 12 pack 3 - Frequency: frequent 3 - Duration: onoing 3 - Last Use / Amount: 03/03/18  CIWA: CIWA-Ar BP: (!) 143/95 Pulse Rate: (!) 111 COWS:    Allergies:  Allergies  Allergen Reactions  . Augmentin [Amoxicillin-Pot  Clavulanate] Hives    Tolerates Amoxicillin - Thuy 09/29/12  . Other Nausea And Vomiting    coconut    Home Medications:  (Not in a hospital admission)  OB/GYN Status:  No LMP for male patient.  General Assessment Data [facility] of Assessment: [facility] TTS Assessment: In system Is this a Tele or Face-to-Face Assessment?: Face-to-Face Is this an Initial Assessment or a Re-assessment for this encounter?: Initial Assessment Patient Accompanied by:: (alone) Language Other than English: No Living Arrangements: Homeless/Shelter What gender do you identify as?: Male Marital status: Separated(pt says his wife left him 1 month ago) Pregnancy Status: No Living Arrangements: Alone Can pt return to current living arrangement?: Yes Admission Status: Voluntary Is patient capable of signing voluntary admission?: Yes Referral Source: Self/Family/Friend Insurance type: none     Crisis Care Plan Living Arrangements: Alone Name of Psychiatrist: none Name of Therapist: none  Education Status Is patient currently in school?: No Is the patient employed, unemployed or receiving disability?: Unemployed(pt quit his job)  Risk to self with the past 6 months Suicidal Ideation: Yes-Currently Present Has patient been a risk to self within the past 6 months prior to admission? : Yes Suicidal Intent: Yes-Currently Present Has patient had any suicidal intent within the past 6 months prior to admission? : Yes Is patient at risk for suicide?: Yes Suicidal Plan?: Yes-Currently Present Has patient had any suicidal plan within the past 6 months prior to admission? : Yes Specify Current Suicidal Plan: pt states he has a current plan to hang himself Access to Means: Yes Specify Access to Suicidal Means: pt has access to rope What has been your use of drugs/alcohol within the last 12 months?: alcohol, cannabis, cocaine  Previous Attempts/Gestures: Yes How many times?: 2 Other Self Harm Risks: hx of suicide  attempt, substance abuse, depression, recent loss  Triggers for Past Attempts: Other personal contacts Intentional Self Injurious Behavior: None Family Suicide History: No Recent stressful life event(s): Financial Problems, Job Loss, Turmoil (Comment)(pregnant wife left him) Persecutory voices/beliefs?: No Depression: Yes Depression Symptoms: Tearfulness, Loss of interest in usual pleasures, Guilt, Feeling worthless/self pity Substance abuse history and/or treatment for substance abuse?: Yes Suicide prevention information given to non-admitted patients: Not applicable  Risk to Others within the past 6 months Homicidal Ideation: No Does patient have any lifetime risk of violence toward others beyond the six months prior to admission? : No Thoughts of Harm to Others: No Current Homicidal Intent: No Current Homicidal Plan: No Access to Homicidal Means: No History of harm to others?: No Assessment of Violence: None Noted Does patient have access to weapons?: No Criminal Charges Pending?: No Does patient have a court date: No Is patient on probation?: No  Psychosis Hallucinations: None noted Delusions: None noted  Mental Status Report Appearance/Hygiene: In scrubs, Unremarkable Eye Contact: Good Motor Activity: Freedom of movement Speech: Logical/coherent Level of Consciousness: Alert, Crying Mood: Depressed, Despair, Sad,  Helpless Affect: Appropriate to circumstance Anxiety Level: None Thought Processes: Relevant, Coherent Judgement: Impaired Orientation: Person, Place, Situation, Time, Appropriate for developmental age Obsessive Compulsive Thoughts/Behaviors: None  Cognitive Functioning Concentration: Normal Memory: Remote Intact, Recent Intact Is patient IDD: No Insight: Poor Impulse Control: Poor Appetite: Good Have you had any weight changes? : No Change Sleep: Decreased Total Hours of Sleep: 4 Vegetative Symptoms: None  ADLScreening [facility] Assessment  Services) Patient's cognitive ability adequate to safely complete daily activities?: Yes Patient able to express need for assistance with ADLs?: Yes Independently performs ADLs?: Yes (appropriate for developmental age)  Prior Inpatient Therapy Prior Inpatient Therapy: Yes Prior Therapy Dates: 2019 Prior Therapy Facilty/Provider(s): [facility] Reason for Treatment: depression  Prior Outpatient Therapy Prior Outpatient Therapy: Yes Prior Therapy Dates: 2017 Prior Therapy Facilty/Provider(s): [facility] Reason for Treatment: Med Management Does patient have an ACCT team?: No Does patient have Intensive In-House Services?  : No Does patient have [facility] services? : No Does patient have P4CC services?: No  ADL Screening (condition at time of admission) Patient's cognitive ability adequate to safely complete daily activities?: Yes Is the patient deaf or have difficulty hearing?: No Does the patient have difficulty seeing, even when wearing glasses/contacts?: No Does the patient have difficulty concentrating, remembering, or making decisions?: No Patient able to express need for assistance with ADLs?: Yes Does the patient have difficulty dressing or bathing?: No Independently performs ADLs?: Yes (appropriate for developmental age) Does the patient have difficulty walking or climbing stairs?: No Weakness of Legs: None Weakness of Arms/Hands: None  Home Assistive Devices/Equipment Home Assistive Devices/Equipment: None    Abuse/Neglect Assessment (Assessment to be complete while patient is alone) Abuse/Neglect Assessment Can Be Completed: Yes Physical Abuse: Denies Verbal Abuse: Denies Sexual Abuse: Denies Exploitation of patient/patient's resources: Denies Self-Neglect: Denies     Merchant navy officer (For Healthcare) Does Patient Have a Medical Advance Directive?: No Would patient like information on creating a medical advance directive?: No - Patient declined           Disposition: Per Donell Sievert, PA pt meets criteria for inpt treatment. Pt's nurse has been advised of the disposition. [facility] to review for possible admission. Disposition Initial Assessment Completed for this Encounter: Yes Disposition of Patient: Admit(per Donell Sievert, PA) Type of inpatient treatment program: Adult Patient refused recommended treatment: No  On Site Evaluation by:   Reviewed with Physician:    Karolee Ohs 03/06/2018 10:01 PM

## 2018-03-06 NOTE — ED Notes (Signed)
Report received from triage nurse pt to be wand and brought to TCU room 31

## 2018-03-06 NOTE — Patient Outreach (Signed)
ED Peer Support Specialist Patient Intake (Complete at intake & 30-60 Day Follow-up)  Name: Devin [name]  MRN: [id number]  Age: 37 y.o.   Date of Admission: 03/06/2018  Intake: Initial Comments:      Primary Reason Admitted: Patient states that he has been struggling with this loss.  He states that he also did not have the money to get his medications filled so he has been off his medications which has contributed to his depression. He states that he has been clean from cocaine since his discharge from [facility], but admits to daily marijuana use. Patient states that he is not sleeping and he has no appetite.  Patient denies HI/Psychosis. He denies any history of abuse or self-mutilation   Lab values: Alcohol/ETOH: Negative Positive UDS? Yes Amphetamines: No Barbiturates: No Benzodiazepines: No Cocaine: No Opiates: No Cannabinoids: Yes  Demographic information:  Gender: Male Ethnicity: White Marital Status: Married Insurance underwriter Status: Uninsured/Self-pay Ecologist (Work Neurosurgeon, Physicist, medical, etc.: No Lives with: Alone Living situation: House/Apartment  Reported Patient History: Patient reported health conditions: Depression, Anxiety disorders Patient aware of HIV and hepatitis status: No  In past year, has patient visited ED for any reason? Yes(same situation )  Number of ED visits:    Reason(s) for visit:    In past year, has patient been hospitalized for any reason? No  Number of hospitalizations:    Reason(s) for hospitalization:    In past year, has patient been arrested? No  Number of arrests:    Reason(s) for arrest:    In past year, has patient been incarcerated? No  Number of incarcerations:    Reason(s) for incarceration:    In past year, has patient received medication-assisted treatment? No  In past year, patient received the following treatments:    In past year, has patient received any harm reduction services?  No  Did this include any of the following?    In past year, has patient received care from a mental health provider for diagnosis other than SUD? No  In past year, is this first time patient has overdosed? No  Number of past overdoses:    In past year, is this first time patient has been hospitalized for an overdose? No  Number of hospitalizations for overdose(s):    Is patient currently receiving treatment for a mental health diagnosis? No  Patient reports experiencing difficulty participating in SUD treatment: No    Most important reason(s) for this difficulty?    Has patient received prior services for treatment? Yes  In past, patient has received services from following agencies: [facility] inpatient )  Plan of Care:  Suggested follow up at these agencies/treatment centers: Other (comment)  Other information: CPSS met with Pt and was able to process with him about what brought Pt in. CPSS was made aware that Pt was dealing with that caused Pt to have thoughts of harming himself. CPSS informed Pt that he does not have to deal with these situation alone and that he can contact CPSS in the community to help Pt with outside situations. CPSS made Pt aware of the fact that there facilities that can offer help with his situation, an CPSS will get in the community to support Pt with getting involved.    Aaron Edelman Lillyona Polasek, CPSS  03/06/2018 1:24 PM

## 2018-03-06 NOTE — Discharge Instructions (Signed)
For your mental health needs, you are advised to follow up with [facility].  New and returning patients are seen at their walk-in clinic.  Walk-in hours are Monday - Friday from 8:00 am - 3:00 pm.  Walk-in patients are seen on a first come, first served basis.  Try to arrive as early as possible for he best chance of being seen the same day:       [facility]      [address]      [place], [place] [address]      [phone]  For your shelter needs, contact the following service providers:       [facility] ([facility])      [address]      [place], [place] [address]      [phone]       [facility]      [address]      [place], [place] [address]      [phone]  For day shelter and other supportive services for the homeless, contact the [facility]):       [facility]      [address]      [place], [place] [address]      [phone]  For transitional housing, contact one of the following agencies.  They provide longer term housing than a shelter, but there is an application process:       [facility]      [address]      [place], [place] [address]      [phone]       [facility]      [facility]      [address]      , [place] [address]      [phone]

## 2018-03-07 ENCOUNTER — Emergency Department (HOSPITAL_COMMUNITY)
Admission: EM | Admit: 2018-03-07 | Discharge: 2018-03-08 | Disposition: A | Discharge status: Home / Self Care | Payer: Self-pay | Attending: Emergency Medicine | Admitting: Emergency Medicine

## 2018-03-07 ENCOUNTER — Other Ambulatory Visit: Payer: Self-pay

## 2018-03-07 ENCOUNTER — Encounter (HOSPITAL_COMMUNITY): Payer: Self-pay | Admitting: Emergency Medicine

## 2018-03-07 DIAGNOSIS — R45851 Suicidal ideations: Secondary | ICD-10-CM | POA: Insufficient documentation

## 2018-03-07 DIAGNOSIS — F17210 Nicotine dependence, cigarettes, uncomplicated: Secondary | ICD-10-CM | POA: Insufficient documentation

## 2018-03-07 DIAGNOSIS — F1994 Other psychoactive substance use, unspecified with psychoactive substance-induced mood disorder: Secondary | ICD-10-CM | POA: Insufficient documentation

## 2018-03-07 LAB — CBC
HCT: 48.9 % (ref 39.0–52.0)
HEMOGLOBIN: 16 g/dL (ref 13.0–17.0)
MCH: 30.2 pg (ref 26.0–34.0)
MCHC: 32.7 g/dL (ref 30.0–36.0)
MCV: 92.3 fL (ref 80.0–100.0)
NRBC: 0 % (ref 0.0–0.2)
Platelets: 339 10*3/uL (ref 150–400)
RBC: 5.3 MIL/uL (ref 4.22–5.81)
RDW: 12.6 % (ref 11.5–15.5)
WBC: 12.6 10*3/uL — AB (ref 4.0–10.5)

## 2018-03-07 LAB — COMPREHENSIVE METABOLIC PANEL
ALBUMIN: 4.2 g/dL (ref 3.5–5.0)
ALT: 23 U/L (ref 0–44)
AST: 19 U/L (ref 15–41)
Alkaline Phosphatase: 48 U/L (ref 38–126)
Anion gap: 11 (ref 5–15)
BILIRUBIN TOTAL: 0.8 mg/dL (ref 0.3–1.2)
BUN: 17 mg/dL (ref 6–20)
CALCIUM: 9.2 mg/dL (ref 8.9–10.3)
CHLORIDE: 108 mmol/L (ref 98–111)
CO2: 21 mmol/L — ABNORMAL LOW (ref 22–32)
Creatinine, Ser: 1.04 mg/dL (ref 0.61–1.24)
Glucose, Bld: 95 mg/dL (ref 70–99)
Potassium: 3.5 mmol/L (ref 3.5–5.1)
SODIUM: 140 mmol/L (ref 135–145)
TOTAL PROTEIN: 7.5 g/dL (ref 6.5–8.1)

## 2018-03-07 LAB — RAPID URINE DRUG SCREEN, HOSP PERFORMED
AMPHETAMINES: NOT DETECTED
Barbiturates: NOT DETECTED
Benzodiazepines: NOT DETECTED
COCAINE: NOT DETECTED
OPIATES: NOT DETECTED
TETRAHYDROCANNABINOL: POSITIVE — AB

## 2018-03-07 LAB — ETHANOL

## 2018-03-07 LAB — ACETAMINOPHEN LEVEL

## 2018-03-07 LAB — SALICYLATE LEVEL: Salicylate Lvl: <7 mg/dL (ref 2.8–30.0)

## 2018-03-07 NOTE — ED Notes (Signed)
Bed: WA26 Expected date:  Expected time:  Means of arrival:  Comments: 

## 2018-03-07 NOTE — ED Notes (Signed)
TTS assessment being done via telepsych machine.

## 2018-03-07 NOTE — ED Notes (Signed)
Pt discharged home. Discharged instructions read to pt who verbalized understanding. All belongings returned to pt who signed for same. Denies SI/HI, is not delusional and not responding to internal stimuli. Escorted pt to the ED exit.   Pt given bus pass and PART pass by Peer support and referrals.
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
## 2018-03-07 NOTE — BH Assessment (Signed)
Tele Assessment Note   Patient Name: Devin [name] MRN: [id number] Referring Physician: Dr. Pilar Plate  Location of Patient: [place]  Location of Provider: [facility]  Devin [name] is an 37 y.o., separated male. Pt presented to [facility] voluntarily and unaccompanied. Pt reports depressive symptoms including insomnia, tearfulness, guilt, hopelessness and despondence. Pt also reports panic attacks daily that cause him to have shortness of breath. Pt reports auditory hallucinations. Pt reports that the [facility] are telling him to kill himself. Pt reports being prescribed Zyprexa, Vistaril, Gabapentin and Remeron. Pt reports that he has not been medication compliant due to cost. Pt denies current therapist and psychiatrist. Pt reports that he was triggered for the SI today due to having tried to see his kids, and was told by his children's mother that he could not see them. Pt stated, "I tried to go see my kids and my baby mama wouldn't let me." Pt report hx of PTSD and Bipolar D/O. Pt reports two suicide attempts in the past. Pt reports that he witnessed his father die 14 years ago, which was a precipitant to his depression.   Pt reports that he is homeless and unemployed. Pt reports being separated from his wife. Pt denied legal hx and probation. Pt reports no physical ailments.   Pt oriented to person, place. time and situation. Pt presented alert, dressed appropriately and groomed. Pt spokeclearly,coherently and did not seem to be under the influence of any substances. Pt made no eye contact, but answered questions appropriately. Pt presented sad, tearful and seemingly anxious. Pt was rocking in the chair the whole assessment. Pt was open to the assessment process. Pt presented with no impairments of remote or recent memory that could be detected.    Diagnosis: F33.3 Major depressive disorder, Recurrent episode, With psychotic features F10.20 Alcohol use disorder, Severe F14.20 Cocaine use disorder,  Severe    Past Medical History:  Past Medical History:  Diagnosis Date  . ADHD (attention deficit hyperactivity disorder)   . Asthma   . GERD (gastroesophageal reflux disease)   . Transaminitis 07/17/2015    Past Surgical History:  Procedure Laterality Date  . TYMPANOPLASTY Left 1997    Family History:  Family History  Problem Relation Age of Onset  . Coronary artery disease Father   . Heart attack Father   . Heart disease Father   . Heart failure Mother   . Hyperlipidemia Mother   . Hypertension Mother   . Migraines Mother   . COPD Mother   . Diabetes Maternal Aunt   . Asthma Maternal Grandfather   . Asthma Paternal Grandfather     Social History:  reports that he has been smoking cigarettes. He has been smoking about 0.50 packs per day. He has never used smokeless tobacco. He reports that he drinks alcohol. He reports that he has current or past drug history. Drugs: Marijuana and Cocaine.  Additional Social History:  Alcohol / Drug Use Pain Medications: SEE MAR  Prescriptions: Pt reports being prescribed Vistaril, Zyprexa, Remeron and Gabapentin.  Over the Counter: SEE MAR.  History of alcohol / drug use?: Yes Longest period of sobriety (when/how long): 1 Month  Negative Consequences of Use: Work / Programmer, multimedia, Copywriter, advertising relationships, Surveyor, quantity Substance #1 Name of Substance 1: Cocaine  1 - Age of First Use: 23 1 - Amount (size/oz): 1-1 1/2 Grams  1 - Frequency: Daily 1 - Duration: 14 Years  1 - Last Use / Amount: 1 Month Ago  Substance #2 Name of  Substance 2: Alcohol  2 - Age of First Use: 23 2 - Amount (size/oz): 12-15 Beers and Pint of Liquor 2 - Frequency: Daily  2 - Duration: 14 Years  2 - Last Use / Amount: 1 Month Ago   CIWA: CIWA-Ar BP: (!) 134/93 Pulse Rate: 74 COWS:    Allergies:  Allergies  Allergen Reactions  . Augmentin [Amoxicillin-Pot Clavulanate] Hives    Tolerates Amoxicillin - Thuy 09/29/12  . Other Nausea And Vomiting    coconut     Home Medications:  (Not in a hospital admission)  OB/GYN Status:  No LMP for male patient.  General Assessment Data [facility] Assessment: In system Is this a Tele or Face-to-Face Assessment?: Tele Assessment Is this an Initial Assessment or a Re-assessment for this encounter?: Initial Assessment Patient Accompanied by:: Other(Pt presents alone. ) Language Other than English: No Living Arrangements: Homeless/Shelter What gender do you identify as?: Male Marital status: Separated Maiden name: N/A Pregnancy Status: No Living Arrangements: Alone Can pt return to current living arrangement?: Yes Admission Status: Voluntary Is patient capable of signing voluntary admission?: Yes Referral Source: Self/Family/Friend Insurance type: Self Pay   Medical Screening Exam [facility] Walk-in ONLY) Medical Exam completed: Yes  Crisis Care Plan Living Arrangements: Alone Legal Guardian: Other:(Self) Name of Psychiatrist: None reported.  Name of Therapist: None reported.   Education Status Is patient currently in school?: No Is the patient employed, unemployed or receiving disability?: Unemployed  Risk to self with the past 6 months Suicidal Ideation: Yes-Currently Present Has patient been a risk to self within the past 6 months prior to admission? : Yes Suicidal Intent: Yes-Currently Present Has patient had any suicidal intent within the past 6 months prior to admission? : Yes Is patient at risk for suicide?: Yes Suicidal Plan?: Yes-Currently Present Has patient had any suicidal plan within the past 6 months prior to admission? : Yes Specify Current Suicidal Plan: Sherri RadHang himself.  Access to Means: Yes Specify Access to Suicidal Means: Pt reports having access to things to hang himself with.  What has been your use of drugs/alcohol within the last 12 months?: Pt reports alcohol and cocaine use.  Previous Attempts/Gestures: Yes How many times?: 2 Other Self Harm  Risks: PT denied.  Triggers for Past Attempts: Other personal contacts Intentional Self Injurious Behavior: None Family Suicide History: No Recent stressful life event(s): Divorce, Loss (Comment), Conflict (Comment)(Pt reports he was not permitted to see his children today.) Persecutory voices/beliefs?: No Depression: Yes Depression Symptoms: Despondent, Insomnia, Tearfulness, Guilt, Feeling worthless/self pity Substance abuse history and/or treatment for substance abuse?: Yes Suicide prevention information given to non-admitted patients: Not applicable  Risk to Others within the past 6 months Homicidal Ideation: No Does patient have any lifetime risk of violence toward others beyond the six months prior to admission? : No Thoughts of Harm to Others: No Current Homicidal Intent: No Current Homicidal Plan: No Access to Homicidal Means: No Identified Victim: Denied.  History of harm to others?: No Assessment of Violence: None Noted Violent Behavior Description: Denied.  Does patient have access to weapons?: No Criminal Charges Pending?: No Does patient have a court date: No Is patient on probation?: No  Psychosis Hallucinations: Visual, Auditory Delusions: None noted  Mental Status Report Appearance/Hygiene: In scrubs, Unremarkable Eye Contact: Poor Motor Activity: Other (Comment)(Rocking ) Speech: Logical/coherent Level of Consciousness: Alert, Crying Mood: Depressed Affect: Depressed Anxiety Level: Moderate Thought Processes: Coherent, Relevant Judgement: Impaired Orientation: Person, Place, Situation, Time,  Appropriate for developmental age Obsessive Compulsive Thoughts/Behaviors: None  Cognitive Functioning Concentration: Normal Memory: Recent Intact, Remote Intact Is patient IDD: No Insight: Poor Impulse Control: Poor Appetite: Fair Have you had any weight changes? : No Change Sleep: Decreased Total Hours of Sleep: 2 Vegetative Symptoms: None  ADLScreening  [facility] Assessment Services) Patient's cognitive ability adequate to safely complete daily activities?: Yes Patient able to express need for assistance with ADLs?: Yes Independently performs ADLs?: Yes (appropriate for developmental age)  Prior Inpatient Therapy Prior Inpatient Therapy: Yes Prior Therapy Dates: 2019 Prior Therapy Facilty/Provider(s): [facility] Reason for Treatment: depression  Prior Outpatient Therapy Prior Outpatient Therapy: Yes Prior Therapy Dates: 2017 Prior Therapy Facilty/Provider(s): [facility] Reason for Treatment: Med Management Does patient have an ACCT team?: No Does patient have Intensive In-House Services?  : No Does patient have [facility] services? : No Does patient have P4CC services?: No  ADL Screening (condition at time of admission) Patient's cognitive ability adequate to safely complete daily activities?: Yes Is the patient deaf or have difficulty hearing?: No Does the patient have difficulty seeing, even when wearing glasses/contacts?: No Does the patient have difficulty concentrating, remembering, or making decisions?: No Patient able to express need for assistance with ADLs?: Yes Does the patient have difficulty dressing or bathing?: No Independently performs ADLs?: Yes (appropriate for developmental age) Does the patient have difficulty walking or climbing stairs?: No Weakness of Legs: None Weakness of Arms/Hands: None  Home Assistive Devices/Equipment Home Assistive Devices/Equipment: None  Therapy Consults (therapy consults require a physician order) PT Evaluation Needed: No OT Evalulation Needed: No SLP Evaluation Needed: No Abuse/Neglect Assessment (Assessment to be complete while patient is alone) Abuse/Neglect Assessment Can Be Completed: Yes Physical Abuse: Denies Verbal Abuse: Denies Sexual Abuse: Denies Exploitation of patient/patient's resources: Denies Self-Neglect: Denies Values / Beliefs Cultural Requests During Hospitalization:  None Spiritual Requests During Hospitalization: None Consults Spiritual Care Consult Needed: No Social Work Consult Needed: No Merchant navy officer (For Healthcare) Does Patient Have a Medical Advance Directive?: No Would patient like information on creating a medical advance directive?: No - Patient declined          Disposition: Per Nira Conn, NP; Pt does not meet criteria for inpatient treatment at this time. Pt to be observed overnight for stabilization due to reports of SI with plan.  Disposition Initial Assessment Completed for this Encounter: Yes  This service was provided via telemedicine using a 2-way, interactive audio and video technology.  Names of all persons participating in this telemedicine service and their role in this encounter. Name: [name] Role: Patient   Name: Chesley Noon  Role: Clinician   Name:  Role:   Name:  Role:    Chesley Noon, M.S., [facility], LCAS Triage Specialist [facility] 03/07/2018 10:39 PM

## 2018-03-07 NOTE — ED Notes (Signed)
Bed: [facility] Expected date:  Expected time:  Means of arrival:  Comments: 

## 2018-03-07 NOTE — Consult Note (Addendum)
[facility] Psych ED Discharge  03/07/2018 10:22 AM Devin [name]  MRN:  [id number] Principal Problem: Substance induced mood disorder [facility]) Discharge Diagnoses:  Patient Active Problem List   Diagnosis Date Noted  . Substance induced mood disorder (HCC) [F19.94] 03/07/2018    Priority: High  . Depression [F32.9]   . MDD (major depressive disorder), recurrent severe, without psychosis (HCC) [F33.2] 01/31/2018  . Bipolar II disorder (HCC) [F31.81] 01/31/2018  . Hypoxia [R09.02]   . COPD exacerbation (HCC) [J44.1]   . Leukocytosis [D72.829] 11/04/2015  . Transaminitis [R74.0] 07/17/2015  . Tooth ache [K08.89] 07/17/2015  . Asthma with status asthmaticus [J45.902] 10/14/2011  . ADHD (attention deficit hyperactivity disorder) [F90.9] 10/14/2011    Subjective: Patient is a 37 y.o. Male who presented to the ED with c/o suicidal ideation to hang himself s/p relationship stress with his pregnant wife, who recently left him (October). On presentation to ED, he endorsed depression including fatigue, low energy, feelings of worthlessness and crying spells.  He admits to using marijuana and drinking alcohol heavily recently, last drink three days ago.  Alcohol makes his depression worse, medications make it better.  He reports history of PTSD, MDD, and Bipolar disorder, treated with Zyprexa and Neurontin by [facility]. He reports that he tried to contact [facility], but has not been able to get anyone on the telephone. He has not been back to [place] due to inability to pay for services, explained to him that money is not required at [facility].  Pt reports that he is currently unemployed and homeless, but able to obtain marijuana and alcohol through friends that he drinks with.   Today, he reports that he feels like "crap" due to cocaine and alcohol use but denied SI. He is open to working with peer support to obtain rehabilitation services in the community. He would like to continue on medication therapy  and open to continue treatment with [facility].   Total Time spent with patient: 15 minutes  Past Psychiatric History: Patient admitted to [facility] inpatient facility last month after wife left, and he feels that this is helpful. He was most recently treated at [facility], but has also been managed by [facility] (last seen Nov-Dec/2018).   Past Medical History:  Past Medical History:  Diagnosis Date  . ADHD (attention deficit hyperactivity disorder)   . Asthma   . GERD (gastroesophageal reflux disease)   . Transaminitis 07/17/2015   Past Surgical History:  Procedure Laterality Date  . TYMPANOPLASTY Left 1997   Family History:  Family History  Problem Relation Age of Onset  . Coronary artery disease Father   . Heart attack Father   . Heart disease Father   . Heart failure Mother   . Hyperlipidemia Mother   . Hypertension Mother   . Migraines Mother   . COPD Mother   . Diabetes Maternal Aunt   . Asthma Maternal Grandfather   . Asthma Paternal Grandfather    Family Psychiatric  History: see above Social History:  Social History   Substance and Sexual Activity  Alcohol Use Yes  . Alcohol/week: 0.0 standard drinks    Social History   Substance and Sexual Activity  Drug Use Yes  . Types: Marijuana, Cocaine   Comment:                     Social History   Socioeconomic History  . Marital status: Single    Spouse name: Not on file  . Number of children: 2  .  Years of education: 12th grade  . Highest education level: 12th grade  Occupational History  . 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 Financial resource strain: Hard  . Food insecurity:    Worry: Sometimes true    Inability: Often true  . Transportation needs:    Medical: Yes    Non-medical: Yes  Tobacco Use  . Smoking status: Current Every Day Smoker    Packs/day: 0.50    Types: Cigarettes  . Smokeless tobacco: Never Used  Substance and Sexual Activity  . Alcohol use: Yes    Alcohol/week: 0.0 standard drinks   . Drug use: Yes    Types: Marijuana, Cocaine    Comment:                    . Sexual activity: Not Currently  Lifestyle  . Physical activity:    Days per week: 0 days    Minutes per session: 0 min  . Stress: Very much  Relationships  . Social connections:    Talks on phone: Once a week    Gets together: Never    Attends religious service: Never    Active member of club or organization: No    Attends meetings of clubs or organizations: Not on file    Relationship status: Divorced  Other Topics Concern  . Not on file  Social History Narrative  . Not on file    Has this patient used any form of tobacco in the last 30 days? (Cigarettes, Smokeless Tobacco, Cigars, and/or Pipes) A prescription for an FDA-approved tobacco cessation medication was offered at discharge and the patient refused  Current Medications: Current Facility-Administered Medications  Medication Dose Route Frequency Provider Last Rate Last Dose  . hydrOXYzine (ATARAX/VISTARIL) tablet 25 mg  25 mg Oral TID PRN Cathren LaineSteinl, Kevin, MD      . mirtazapine (REMERON) tablet 30 mg  30 mg Oral QHS Cathren LaineSteinl, Kevin, MD   30 mg at 03/07/18 0221  . OLANZapine (ZYPREXA) tablet 2.5 mg  2.5 mg Oral QHS Cathren LaineSteinl, Kevin, MD   2.5 mg at 03/07/18 0220   Current Outpatient Medications  Medication Sig Dispense Refill  . albuterol (PROVENTIL HFA;VENTOLIN HFA) 108 (90 Base) MCG/ACT inhaler Inhale 1-2 puffs into the lungs every 4 (four) hours as needed for wheezing or shortness of breath.    . fluticasone (FLOVENT HFA) 44 MCG/ACT inhaler Inhale 2 puffs into the lungs 2 (two) times daily. For asthma 1 Inhaler 12  . gabapentin (NEURONTIN) 400 MG capsule Take 1 capsule (400 mg total) by mouth 3 (three) times daily. For agitation 90 capsule 0  . hydrOXYzine (ATARAX/VISTARIL) 25 MG tablet Take 1 tablet (25 mg) by mouth three times daily & 2 tablets (50 mg) at bedtime for anxiety/sleep 90 tablet 0  . Melatonin 3 MG TABS Take 1 tablet (3 mg total) by  mouth at bedtime. For sleep 30 tablet 0  . mirtazapine (REMERON) 30 MG tablet Take 1 tablet (30 mg total) by mouth at bedtime. For depression 30 tablet 0  . nicotine (NICODERM CQ - DOSED IN MG/24 HOURS) 21 mg/24hr patch Place 1 patch (21 mg total) onto the skin daily. (May buy from over the counter): For smoking cessation 28 patch 0  . OLANZapine (ZYPREXA) 2.5 MG tablet Take 1 tablet (2.5 mg total) by mouth at bedtime. For mood control 30 tablet 0  . pantoprazole (PROTONIX) 40 MG tablet Take 1 tablet (40 mg total) by mouth daily. For  acid reflux 30 tablet 0  . albuterol (PROVENTIL HFA;VENTOLIN HFA) 108 (90 Base) MCG/ACT inhaler Inhale 1-2 puffs into the lungs every 6 (six) hours as needed for wheezing or shortness of breath. (Patient not taking: Reported on 03/06/2018) 1 Inhaler 0  . benzocaine (ORAJEL) 10 % mucosal gel Use as directed in the mouth or throat 4 (four) times daily as needed for mouth pain. (Patient not taking: Reported on 03/05/2018) 5.3 g 0  . fluticasone (FLONASE) 50 MCG/ACT nasal spray Place 1 spray into both nostrils daily. For allergies (Patient not taking: Reported on 03/05/2018) 16 g 2  . predniSONE (DELTASONE) 20 MG tablet Take 2 tablets (40 mg total) by mouth daily. 8 tablet 0   PTA Medications:  (Not in a hospital admission)  Musculoskeletal: Strength & Muscle Tone: within normal limits Gait & Station: normal Patient leans: N/A  Psychiatric Specialty Exam: Physical Exam  Nursing note and vitals reviewed. Constitutional: He is oriented to person, place, and time. He appears well-developed and well-nourished.  HENT:  Head: Normocephalic and atraumatic.  Neck: Normal range of motion.  Respiratory: Effort normal.  Musculoskeletal: Normal range of motion.  Neurological: He is alert and oriented to person, place, and time.  Psychiatric: His speech is normal and behavior is normal. Judgment and thought content normal. Cognition and memory are normal. He exhibits a  depressed mood.    Review of Systems  Psychiatric/Behavioral: Positive for depression and substance abuse.  All other systems reviewed and are negative.   Blood pressure 119/74, pulse (!) 51, temperature 97.9 F (36.6 C), temperature source Oral, resp. rate 16, height 5\' 9"  (1.753 m), weight 87.5 kg, SpO2 97 %.Body mass index is 28.5 kg/m.  General Appearance: Casual  Eye Contact:  Fair  Speech:  Normal Rate  Volume:  Normal  Mood:  Depressed  Affect:  Appropriate  Thought Process:  Coherent and Linear  Orientation:  Full (Time, Place, and Person)  Thought Content:  Logical  Suicidal Thoughts:  No  Homicidal Thoughts:  No  Memory:  Immediate;   Good Recent;   Good Remote;   Good  Judgement:  Fair  Insight:  Fair  Psychomotor Activity:  NA and Normal  Concentration:  Concentration: Good  Recall:  Good  Fund of Knowledge:  Good  Language:  Good  Akathisia:  No  Handed:  Right  AIMS (if indicated):     Assets:  Desire for Improvement Physical Health  ADL's:  Intact  Cognition:  WNL  Sleep:        Demographic Factors:  Male, Caucasian, Unemployed and seperated  Loss Factors: Loss of significant relationship and Financial problems/change in socioeconomic status  Historical Factors: NA  Risk Reduction Factors:   NA  Continued Clinical Symptoms:  Depression:   Comorbid alcohol abuse/dependence  Cognitive Features That Contribute To Risk:  None    Suicide Risk:  Mild:  Suicidal ideation of limited frequency, intensity, duration, and specificity.  There are no identifiable plans, no associated intent, mild dysphoria and related symptoms, good self-control (both objective and subjective assessment), few other risk factors, and identifiable protective factors, including available and accessible social support.    Plan Of Care/Follow-up recommendations:   Substance Use Disorder -Patient motivated to meet with peer support to obtain information for substance  rehabilitation  Major Depression Disorder:  -Will continue Zyprexa 2.5mg  q hs, mirtazapine 3 mg q hs  Insomnia: -Will continue Hydoxyzine 25mg  TID prn   Disposition: Discharge shelter  Nanine Means, NP  03/07/2018, 10:22 AM  Patient seen face-to-face for psychiatric evaluation, chart reviewed and case discussed with the physician extender and developed treatment plan. Reviewed the information documented and agree with the treatment plan. Thedore Mins, MD

## 2018-03-07 NOTE — Patient Outreach (Signed)
CPSS met with the patient to provide substance use recovery support and help with recovery resources. CPSS Aaron Edelman met with the patient yesterday to help with recovery resources. Patient has had issues gaining access to recovery resources because of lack of transportation, being cut off from recovery supports, and experiencing homelessness. CPSS provided the patient with an [place] list, residential/outpatient substance use treatment center list, information for the [employer], and [place] meeting list. Patient has been trying to get connected get connected to a homeless shelter and currently the ones in [place] area do not have no beds available. Patient tried [facility], but the patient does not have access to consistent income in order to get connected to an [employer]. Patient asked about [facility], but they do not do admissions on the weekends. CPSS talked the patient about the [facility] and to call them on Monday as well as [facility] for possible admission. CPSS suggested the patient go to a recovery meeting like [facility] for further support with their free support group meetings to get connected to other recovery supports. CPSS also provided [employer] bus pass and CPSS contact information. CPSS strongly encouraged the patient continually stay in contact with CPSS for help with recovery resources and substance use recovery support after discharge from the [facility].

## 2018-03-07 NOTE — ED Triage Notes (Signed)
Pt presents with thoughts of suicide via hanging after argument with spouse. Pt reports not having anywhere to stay until Monday.

## 2018-03-08 DIAGNOSIS — F1994 Other psychoactive substance use, unspecified with psychoactive substance-induced mood disorder: Secondary | ICD-10-CM

## 2018-03-08 NOTE — Consult Note (Addendum)
[facility] Psych ED Discharge  03/08/2018 10:40 AM Devin [name]  MRN:  [id number] Principal Problem: Substance induced mood disorder [facility]) Discharge Diagnoses:  Patient Active Problem List   Diagnosis Date Noted  . Substance induced mood disorder (HCC) [F19.94] 03/07/2018    Priority: High  . Depression [F32.9]   . MDD (major depressive disorder), recurrent severe, without psychosis (HCC) [F33.2] 01/31/2018  . Bipolar II disorder (HCC) [F31.81] 01/31/2018  . Hypoxia [R09.02]   . COPD exacerbation (HCC) [J44.1]   . Leukocytosis [D72.829] 11/04/2015  . Transaminitis [R74.0] 07/17/2015  . Tooth ache [K08.89] 07/17/2015  . Asthma with status asthmaticus [J45.902] 10/14/2011  . ADHD (attention deficit hyperactivity disorder) [F90.9] 10/14/2011    Subjective: 37 yo male who presented to the ED with suicidal ideations after an argument with his spouse.  He has presented with the same symptoms for the past 3 days and discharged. Yesterday, he was discharged and returned six hours later. Devin [name]'s issue is being homeless because he spouse and his girlfriend are done with him.  He does have a place to go on Monday.  Encouraged him to use his resources provided to him.  No depression today, no issues with sleep or appetite. Stable for discharge.  Total Time spent with patient: 45 minutes  Past Psychiatric History: alcohol abuse,   Past Medical History:  Past Medical History:  Diagnosis Date  . ADHD (attention deficit hyperactivity disorder)   . Asthma   . GERD (gastroesophageal reflux disease)   . Transaminitis 07/17/2015   Past Surgical History:  Procedure Laterality Date  . TYMPANOPLASTY Left 1997   Family History:  Family History  Problem Relation Age of Onset  . Coronary artery disease Father   . Heart attack Father   . Heart disease Father   . Heart failure Mother   . Hyperlipidemia Mother   . Hypertension Mother   . Migraines Mother   . COPD Mother   . Diabetes Maternal Aunt   .  Asthma Maternal Grandfather   . Asthma Paternal Grandfather    Family Psychiatric  History: none Social History:  Social History   Substance and Sexual Activity  Alcohol Use Yes  . Alcohol/week: 0.0 standard drinks    Social History   Substance and Sexual Activity  Drug Use Yes  . Types: Marijuana, Cocaine   Comment:                     Social History   Socioeconomic History  . Marital status: Single    Spouse name: Not on file  . Number of children: 2  . Years of education: 12th grade  . Highest education level: 12th grade  Occupational History  . 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 Financial resource strain: Hard  . Food insecurity:    Worry: Sometimes true    Inability: Often true  . Transportation needs:    Medical: Yes    Non-medical: Yes  Tobacco Use  . Smoking status: Current Every Day Smoker    Packs/day: 0.50    Types: Cigarettes  . Smokeless tobacco: Never Used  Substance and Sexual Activity  . Alcohol use: Yes    Alcohol/week: 0.0 standard drinks  . Drug use: Yes    Types: Marijuana, Cocaine    Comment:                    . Sexual activity: Not Currently  Lifestyle  . Physical activity:  Days per week: 0 days    Minutes per session: 0 min  . Stress: Very much  Relationships  . Social connections:    Talks on phone: Once a week    Gets together: Never    Attends religious service: Never    Active member of club or organization: No    Attends meetings of clubs or organizations: Not on file    Relationship status: Divorced  Other Topics Concern  . Not on file  Social History Narrative  . Not on file    Has this patient used any form of tobacco in the last 30 days? (Cigarettes, Smokeless Tobacco, Cigars, and/or Pipes) A prescription for an FDA-approved tobacco cessation medication was offered at discharge and the patient refused  Current Medications: No current facility-administered medications for this encounter.    Current  Outpatient Medications  Medication Sig Dispense Refill  . albuterol (PROVENTIL HFA;VENTOLIN HFA) 108 (90 Base) MCG/ACT inhaler Inhale 1-2 puffs into the lungs every 4 (four) hours as needed for wheezing or shortness of breath. (Patient taking differently: Inhale 2 puffs into the lungs every 6 (six) hours as needed for wheezing or shortness of breath. )    . fluticasone (FLOVENT HFA) 44 MCG/ACT inhaler Inhale 2 puffs into the lungs 2 (two) times daily. For asthma 1 Inhaler 12  . gabapentin (NEURONTIN) 400 MG capsule Take 1 capsule (400 mg total) by mouth 3 (three) times daily. For agitation 90 capsule 0  . hydrOXYzine (ATARAX/VISTARIL) 25 MG tablet Take 1 tablet (25 mg) by mouth three times daily & 2 tablets (50 mg) at bedtime for anxiety/sleep 90 tablet 0  . Melatonin 3 MG CAPS Take 3 mg by mouth at bedtime.    . mirtazapine (REMERON) 30 MG tablet Take 1 tablet (30 mg total) by mouth at bedtime. For depression 30 tablet 0  . OLANZapine (ZYPREXA) 2.5 MG tablet Take 1 tablet (2.5 mg total) by mouth at bedtime. For mood control 30 tablet 0  . pantoprazole (PROTONIX) 40 MG tablet Take 1 tablet (40 mg total) by mouth daily. For acid reflux 30 tablet 0   PTA Medications:  (Not in a hospital admission)  Musculoskeletal: Strength & Muscle Tone: within normal limits Gait & Station: normal Patient leans: N/A  Psychiatric Specialty Exam: Physical Exam  Nursing note and vitals reviewed. Constitutional: He is oriented to person, place, and time. He appears well-developed and well-nourished.  HENT:  Head: Normocephalic.  Neck: Normal range of motion.  Respiratory: Effort normal.  Musculoskeletal: Normal range of motion.  Neurological: He is alert and oriented to person, place, and time.  Psychiatric: He has a normal mood and affect. His speech is normal and behavior is normal. Judgment and thought content normal. Cognition and memory are normal.    Review of Systems  Psychiatric/Behavioral:  Positive for substance abuse.  All other systems reviewed and are negative.   Blood pressure 103/68, pulse (!) 56, temperature 98.2 F (36.8 C), temperature source Oral, resp. rate 16, height 5\' 9"  (1.753 m), weight 87.5 kg, SpO2 100 %.Body mass index is 28.5 kg/m.  General Appearance: Casual  Eye Contact:  Good  Speech:  Normal Rate  Volume:  Normal  Mood:  Euthymic  Affect:  Congruent  Thought Process:  Coherent and Descriptions of Associations: Intact  Orientation:  Full (Time, Place, and Person)  Thought Content:  WDL and Logical  Suicidal Thoughts:  No  Homicidal Thoughts:  No  Memory:  Immediate;   Good Recent;  Good Remote;   Good  Judgement:  Fair  Insight:  Fair  Psychomotor Activity:  Normal  Concentration:  Concentration: Good and Attention Span: Good  Recall:  Good  Fund of Knowledge:  Good  Language:  Good  Akathisia:  No  Handed:  Right  AIMS (if indicated):     Assets:  Leisure Time Physical Health Resilience  ADL's:  Intact  Cognition:  WNL  Sleep:        Demographic Factors:  Male and Caucasian  Loss Factors: NA  Historical Factors: NA  Risk Reduction Factors:   Sense of responsibility to family and Positive therapeutic relationship  Continued Clinical Symptoms:  None  Cognitive Features That Contribute To Risk:  None    Suicide Risk:  Minimal: No identifiable suicidal ideation.  Patients presenting with no risk factors but with morbid ruminations; may be classified as minimal risk based on the severity of the depressive symptoms    Plan Of Care/Follow-up recommendations:   Substance induced mood disorder:  Substance Use Disorder -Patient motivated to meet with peer support to obtain information for substance rehabilitation  Major Depression Disorder:  -Will continue Zyprexa 2.5mg  q hs, mirtazapine 3 mg q hs  Insomnia: -Will continue Hydoxyzine 25mg  TID prn  Activity:  as tolerated Diet:  heart healthy diet  Disposition:  discharge home Nanine MeansLORD, JAMISON, NP 03/08/2018, 10:40 AM  Patient seen face-to-face for psychiatric evaluation, chart reviewed and case discussed with the physician extender and developed treatment plan. Reviewed the information documented and agree with the treatment plan. Thedore MinsMojeed Meliza Kage, MD

## 2018-04-08 ENCOUNTER — Emergency Department (HOSPITAL_COMMUNITY): Payer: Self-pay

## 2018-04-08 ENCOUNTER — Other Ambulatory Visit: Payer: Self-pay

## 2018-04-08 ENCOUNTER — Encounter (HOSPITAL_COMMUNITY): Payer: Self-pay | Admitting: Emergency Medicine

## 2018-04-08 ENCOUNTER — Emergency Department (HOSPITAL_COMMUNITY)
Admission: EM | Admit: 2018-04-08 | Discharge: 2018-04-08 | Disposition: A | Discharge status: Home / Self Care | Payer: Self-pay | Attending: Emergency Medicine | Admitting: Emergency Medicine

## 2018-04-08 DIAGNOSIS — J4521 Mild intermittent asthma with (acute) exacerbation: Secondary | ICD-10-CM | POA: Insufficient documentation

## 2018-04-08 DIAGNOSIS — R69 Illness, unspecified: Secondary | ICD-10-CM

## 2018-04-08 DIAGNOSIS — J111 Influenza due to unidentified influenza virus with other respiratory manifestations: Secondary | ICD-10-CM | POA: Insufficient documentation

## 2018-04-08 LAB — CBC WITH DIFFERENTIAL/PLATELET
Abs Immature Granulocytes: 0.1 10*3/uL — ABNORMAL HIGH (ref 0.00–0.07)
Basophils Absolute: 0 10*3/uL (ref 0.0–0.1)
Basophils Relative: 0 %
Eosinophils Absolute: 0 10*3/uL (ref 0.0–0.5)
Eosinophils Relative: 0 %
HCT: 46.5 % (ref 39.0–52.0)
Hemoglobin: 15.4 g/dL (ref 13.0–17.0)
Immature Granulocytes: 1 %
Lymphocytes Relative: 8 %
Lymphs Abs: 1.5 10*3/uL (ref 0.7–4.0)
MCH: 29.6 pg (ref 26.0–34.0)
MCHC: 33.1 g/dL (ref 30.0–36.0)
MCV: 89.4 fL (ref 80.0–100.0)
Monocytes Absolute: 1.7 10*3/uL — ABNORMAL HIGH (ref 0.1–1.0)
Monocytes Relative: 9 %
Neutro Abs: 15.4 10*3/uL — ABNORMAL HIGH (ref 1.7–7.7)
Neutrophils Relative %: 82 %
Platelets: 248 10*3/uL (ref 150–400)
RBC: 5.2 MIL/uL (ref 4.22–5.81)
RDW: 12 % (ref 11.5–15.5)
WBC: 18.8 10*3/uL — ABNORMAL HIGH (ref 4.0–10.5)
nRBC: 0 % (ref 0.0–0.2)

## 2018-04-08 LAB — COMPREHENSIVE METABOLIC PANEL
ALT: 30 U/L (ref 0–44)
AST: 27 U/L (ref 15–41)
Albumin: 3.8 g/dL (ref 3.5–5.0)
Alkaline Phosphatase: 58 U/L (ref 38–126)
Anion gap: 9 (ref 5–15)
BUN: 9 mg/dL (ref 6–20)
CO2: 25 mmol/L (ref 22–32)
Calcium: 9 mg/dL (ref 8.9–10.3)
Chloride: 99 mmol/L (ref 98–111)
Creatinine, Ser: 1.28 mg/dL — ABNORMAL HIGH (ref 0.61–1.24)
GFR calc Af Amer: >60 mL/min (ref >60)
GFR calc non Af Amer: >60 mL/min (ref >60)
Glucose, Bld: 118 mg/dL — ABNORMAL HIGH (ref 70–99)
Potassium: 3.4 mmol/L — ABNORMAL LOW (ref 3.5–5.1)
Sodium: 133 mmol/L — ABNORMAL LOW (ref 135–145)
Total Bilirubin: 0.4 mg/dL (ref 0.3–1.2)
Total Protein: 7.1 g/dL (ref 6.5–8.1)

## 2018-04-08 LAB — GROUP A STREP BY PCR: Group A Strep by PCR: NOT DETECTED

## 2018-04-08 MED ORDER — ONDANSETRON HCL 4 MG PO TABS: 4.0000 mg | ORAL_TABLET | Freq: Four times a day (QID) | ORAL | 0 refills | Status: DC

## 2018-04-08 MED ORDER — ONDANSETRON HCL 4 MG/2ML IJ SOLN
4.0000 mg | Freq: Once | INTRAMUSCULAR | Status: AC
  Administered 2018-04-08: 4 mg via INTRAVENOUS
  Filled 2018-04-08: qty 2

## 2018-04-08 MED ORDER — ALBUTEROL SULFATE (2.5 MG/3ML) 0.083% IN NEBU
5.0000 mg | INHALATION_SOLUTION | Freq: Once | RESPIRATORY_TRACT | Status: AC
  Administered 2018-04-08: 5 mg via RESPIRATORY_TRACT
  Filled 2018-04-08: qty 6

## 2018-04-08 MED ORDER — ACETAMINOPHEN 500 MG PO TABS
1000.0000 mg | ORAL_TABLET | Freq: Once | ORAL | Status: AC
  Administered 2018-04-08: 1000 mg via ORAL
  Filled 2018-04-08: qty 2

## 2018-04-08 MED ORDER — ALBUTEROL SULFATE HFA 108 (90 BASE) MCG/ACT IN AERS
2.0000 | INHALATION_SPRAY | Freq: Once | RESPIRATORY_TRACT | Status: AC
  Administered 2018-04-08: 2 via RESPIRATORY_TRACT
  Filled 2018-04-08: qty 6.7

## 2018-04-08 MED ORDER — AZITHROMYCIN 250 MG PO TABS: 250.0000 mg | ORAL_TABLET | Freq: Every day | ORAL | 0 refills | Status: DC

## 2018-04-08 MED ORDER — SODIUM CHLORIDE 0.9 % IV BOLUS
1000.0000 mL | Freq: Once | INTRAVENOUS | Status: AC
  Administered 2018-04-08: 1000 mL via INTRAVENOUS

## 2018-04-08 MED ORDER — PREDNISONE 20 MG PO TABS: 40.0000 mg | ORAL_TABLET | Freq: Every day | ORAL | 0 refills | Status: DC

## 2018-04-08 MED ORDER — ACETAMINOPHEN 500 MG PO TABS: 500.0000 mg | ORAL_TABLET | Freq: Four times a day (QID) | ORAL | 0 refills | Status: DC | PRN

## 2018-04-08 MED ORDER — KETOROLAC TROMETHAMINE 30 MG/ML IJ SOLN
15.0000 mg | Freq: Once | INTRAMUSCULAR | Status: AC
  Administered 2018-04-08: 15 mg via INTRAVENOUS
  Filled 2018-04-08: qty 1

## 2018-04-08 NOTE — Discharge Instructions (Addendum)
You appear to have a flulike illness.  This can last 7 to 10 days.  You also most likely have an asthma exacerbation.  Considering your new sputum, wheezing, will cover with azithromycin and prednisone, however this will NOT treat the flu.  Take Zofran every 6 hours as needed for nausea or vomiting.  Take Tylenol every 6 hours or 2 pills (1000 mg) every 8 hours for fever, headache, and body aches.  Take prednisone until completed.  After prednisone, you can alternate Tylenol with ibuprofen for your fever.  Take Z-Pak until completed.  Use albuterol every 4-6 hours as needed for shortness of breath or wheezing.  Make sure to drink plenty of fluids and get plenty of rest.  Please return to the emergency department you develop any new or worsening symptoms.  I recommend establishing care with a primary care provider by calling with the numbers below.

## 2018-04-08 NOTE — ED Notes (Signed)
ED Provider at bedside. 

## 2018-04-08 NOTE — ED Notes (Signed)
Pt given Sprite and crackers for PO challenge. 

## 2018-04-08 NOTE — ED Notes (Signed)
Patient transported to X-ray 

## 2018-04-08 NOTE — ED Provider Notes (Signed)
[name] EMERGENCY DEPARTMENT Provider Note   CSN: [id number] Arrival date & time: 04/08/18  1639     History   Chief Complaint Chief Complaint  Patient presents with  . Fever    HPI Devin [name] is a 37 y.o. male with history of asthma, GERD, transaminitis who presents with a 2-week history of cough and 1 day history of fever as well as nausea and vomiting.  Patient has had yellow sputum.  Has had associated sore throat and nasal congestion.  He denies any abdominal pain except when he is vomiting.  He has had associated lightheadedness with standing.  He denies any chest pain.  He has had shortness of breath and wheezing for the past few days.  He does not have an inhaler at home.  He thought he may have had bronchitis, however he became much worse over the past day or so.  He has not taken any medications over-the-counter.  HPI  Past Medical History:  Diagnosis Date  . ADHD (attention deficit hyperactivity disorder)   . Asthma   . GERD (gastroesophageal reflux disease)   . Transaminitis 07/17/2015    Patient Active Problem List   Diagnosis Date Noted  . Substance induced mood disorder (HCC) 03/07/2018  . Depression   . 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 Bipolar II disorder (HCC) 01/31/2018  . Hypoxia   . COPD exacerbation (HCC)   . Leukocytosis 11/04/2015  . Transaminitis 07/17/2015  . Tooth ache 07/17/2015  . Asthma with status asthmaticus 10/14/2011  . ADHD (attention deficit hyperactivity disorder) 10/14/2011    Past Surgical History:  Procedure Laterality Date  . TYMPANOPLASTY Left 1997        Home Medications    Prior to Admission medications   Medication Sig Start Date End Date Taking? Authorizing Provider  acetaminophen (TYLENOL) 500 MG tablet Take 1 tablet (500 mg total) by mouth every 6 (six) hours as needed. 04/08/18   Enisa Runyan, Waylan Boga, PA-C  albuterol (PROVENTIL  HFA;VENTOLIN HFA) 108 (90 Base) MCG/ACT inhaler Inhale 1-2 puffs into the lungs every 4 (four) hours as needed for wheezing or shortness of breath. Patient not taking: Reported on 04/08/2018 02/09/18   Armandina Stammer I, NP  azithromycin (ZITHROMAX) 250 MG tablet Take 1 tablet (250 mg total) by mouth daily. Take first 2 tablets together, then 1 every day until finished. 04/08/18   Caesar Mannella, Waylan Boga, PA-C  fluticasone (FLOVENT HFA) 44 MCG/ACT inhaler Inhale 2 puffs into the lungs 2 (two) times daily. For asthma Patient not taking: Reported on 04/08/2018 02/09/18   Armandina Stammer I, NP  gabapentin (NEURONTIN) 400 MG capsule Take 1 capsule (400 mg total) by mouth 3 (three) times daily. For agitation Patient not taking: Reported on 04/08/2018 02/09/18   Armandina Stammer I, NP  hydrOXYzine (ATARAX/VISTARIL) 25 MG tablet Take 1 tablet (25 mg) by mouth three times daily & 2 tablets (50 mg) at bedtime for anxiety/sleep Patient not taking: Reported on 04/08/2018 02/09/18   Armandina Stammer I, NP  mirtazapine (REMERON) 30 MG tablet Take 1 tablet (30 mg total) by mouth at bedtime. For depression Patient not taking: Reported on 04/08/2018 02/09/18   Armandina Stammer I, NP  OLANZapine (ZYPREXA) 2.5 MG tablet Take 1 tablet (2.5 mg total) by mouth at bedtime. For mood control Patient not taking: Reported on 04/08/2018 02/09/18   Armandina Stammer I, NP  ondansetron (ZOFRAN) 4 MG tablet Take 1 tablet (4 mg total) by  mouth every 6 (six) hours. 04/08/18   Yeraldy Spike, Waylan BogaAlexandra M, PA-C  pantoprazole (PROTONIX) 40 MG tablet Take 1 tablet (40 mg total) by mouth daily. For acid reflux Patient not taking: Reported on 04/08/2018 02/10/18   Armandina StammerNwoko, Agnes I, NP  predniSONE (DELTASONE) 20 MG tablet Take 2 tablets (40 mg total) by mouth daily. 04/08/18   Emi HolesLaw, Augusto Deckman M, PA-C    Family History Family History  Problem Relation Age of Onset  . Coronary artery disease Father   . Heart attack Father   . Heart disease Father   . Heart failure Mother     . Hyperlipidemia Mother   . Hypertension Mother   . Migraines Mother   . COPD Mother   . Diabetes Maternal Aunt   . Asthma Maternal Grandfather   . Asthma Paternal Grandfather     Social History Social History   Tobacco Use  . Smoking status: Current Every Day Smoker    Packs/day: 0.50    Types: Cigarettes  . Smokeless tobacco: Never Used  Substance Use Topics  . Alcohol use: Yes    Alcohol/week: 0.0 standard drinks  . Drug use: Yes    Types: Marijuana, Cocaine    Comment:                       Allergies   Augmentin [amoxicillin-pot clavulanate] and Other   Review of Systems Review of Systems  Constitutional: Positive for chills and fever.  HENT: Positive for congestion and sore throat. Negative for facial swelling.   Respiratory: Positive for cough, shortness of breath and wheezing.   Cardiovascular: Negative for chest pain.  Gastrointestinal: Positive for nausea and vomiting. Negative for abdominal pain and diarrhea.  Genitourinary: Negative for dysuria.  Musculoskeletal: Negative for back pain.  Skin: Negative for rash and wound.  Neurological: Positive for light-headedness. Negative for headaches.  Psychiatric/Behavioral: The patient is not nervous/anxious.      Physical Exam Updated Vital Signs BP 135/82 (BP Location: Right Arm)   Pulse (!) 104   Temp (!) 100.6 F (38.1 C) (Oral)   Resp 16   Ht 5\' 9"  (1.753 m)   Wt 87.5 kg   SpO2 98%   BMI 28.50 kg/m   Physical Exam Vitals signs and nursing note reviewed.  Constitutional:      General: He is not in acute distress.    Appearance: He is well-developed. He is not diaphoretic.  HENT:     Head: Normocephalic and atraumatic.     Comments: Cerumen impaction on the R, TM clear on the L    Mouth/Throat:     Pharynx: Posterior oropharyngeal erythema present. No oropharyngeal exudate.  Eyes:     General: No scleral icterus.       Right eye: No discharge.        Left eye: No discharge.      Conjunctiva/sclera: Conjunctivae normal.     Pupils: Pupils are equal, round, and reactive to light.  Neck:     Musculoskeletal: Normal range of motion and neck supple.     Thyroid: No thyromegaly.     Comments: Chin to chest without difficulty Cardiovascular:     Rate and Rhythm: Normal rate and regular rhythm.     Heart sounds: Normal heart sounds. No murmur. No friction rub. No gallop.   Pulmonary:     Effort: Pulmonary effort is normal. No respiratory distress.     Breath sounds: No stridor. Wheezing (expiratory bilaterally) present.  No rales.  Abdominal:     General: Bowel sounds are normal. There is no distension.     Palpations: Abdomen is soft.     Tenderness: There is no abdominal tenderness. There is no guarding or rebound.  Lymphadenopathy:     Cervical: No cervical adenopathy.  Skin:    General: Skin is warm and dry.     Coloration: Skin is not pale.     Findings: No rash.  Neurological:     Mental Status: He is alert.     Coordination: Coordination normal.      ED Treatments / Results  Labs (all labs ordered are listed, but only abnormal results are displayed) Labs Reviewed  COMPREHENSIVE METABOLIC PANEL - Abnormal; Notable for the following components:      Result Value   Sodium 133 (*)    Potassium 3.4 (*)    Glucose, Bld 118 (*)    Creatinine, Ser 1.28 (*)    All other components within normal limits  CBC WITH DIFFERENTIAL/PLATELET - Abnormal; Notable for the following components:   WBC 18.8 (*)    Neutro Abs 15.4 (*)    Monocytes Absolute 1.7 (*)    Abs Immature Granulocytes 0.10 (*)    All other components within normal limits  GROUP A STREP BY PCR    EKG None  Radiology Dg Chest 2 View  Result Date: 04/08/2018 CLINICAL DATA:  37 y/o M; chills, fever, cough, congestion, shortness of breath for 2 weeks. EXAM: CHEST - 2 VIEW COMPARISON:  01/31/2018 chest radiograph. FINDINGS: Stable heart size and mediastinal contours are within normal limits.  No consolidation, effusion, or pneumothorax. The visualized skeletal structures are unremarkable. IMPRESSION: No acute pulmonary process identified. Electronically Signed   By: Mitzi Hansen M.D.   On: 04/08/2018 17:43    Procedures Procedures (including critical care time)  Medications Ordered in ED Medications  albuterol (PROVENTIL) (2.5 MG/3ML) 0.083% nebulizer solution 5 mg (5 mg Nebulization Given 04/08/18 1737)  ondansetron (ZOFRAN) injection 4 mg (4 mg Intravenous Given 04/08/18 1737)  sodium chloride 0.9 % bolus 1,000 mL (0 mLs Intravenous Stopped 04/08/18 1846)  acetaminophen (TYLENOL) tablet 1,000 mg (1,000 mg Oral Given 04/08/18 1951)  albuterol (PROVENTIL HFA;VENTOLIN HFA) 108 (90 Base) MCG/ACT inhaler 2 puff (2 puffs Inhalation Given 04/08/18 1951)  ondansetron (ZOFRAN) injection 4 mg (4 mg Intravenous Given 04/08/18 1951)  ketorolac (TORADOL) 30 MG/ML injection 15 mg (15 mg Intravenous Given 04/08/18 2049)     Initial Impression / Assessment and Plan / ED Course  I have reviewed the triage vital signs and the nursing notes.  Pertinent labs & imaging results that were available during my care of the patient were reviewed by me and considered in my medical decision making (see chart for details).     Patient with suspected influenza-like illness as well as probable asthma exacerbation.  His labs show mild dehydration.  Patient given IV fluids in the ED.  Fever treated with Tylenol and headache treated with Toradol. Abdomen is soft and nontender. No indication for abdominal imaging today.  Patient is tolerating oral fluids and food after Zofran.  Chest x-ray is clear, however considering 2 weeks of coughing prior to presentation today, wheezing on my exam, and yellow productive sputum, will cover with azithromycin and prednisone burst.  I discussed the side effects versus cost versus benefit of Tamiflu with the patient and he declines today.  Will discharge home with  Zofran, Tylenol.  Patient given albuterol inhaler.  Patient denies taking resume taking ibuprofen after prednisone is complete.  Oral hydration and rest discussed.  Follow-up to establish care with PCP.  Patient understands and agrees with plan.  Return precautions discussed.  Patient vitals stable and discharged in satisfactory condition.  Final Clinical Impressions(s) / ED Diagnoses   Final diagnoses:  Influenza-like illness  Mild intermittent asthma with exacerbation    ED Discharge Orders         Ordered    azithromycin (ZITHROMAX) 250 MG tablet  Daily     04/08/18 2115    predniSONE (DELTASONE) 20 MG tablet  Daily     04/08/18 2115    ondansetron (ZOFRAN) 4 MG tablet  Every 6 hours     04/08/18 2115    acetaminophen (TYLENOL) 500 MG tablet  Every 6 hours PRN     04/08/18 2115           Emi Holes, PA-C 04/09/18 0047    Mancel Bale, MD 04/09/18 [phone]

## 2018-04-08 NOTE — ED Triage Notes (Signed)
Pt states he started having cold chills last night and a fever this morning highest at 102.7 oral. Temp at triage is 99.8 oral. Pt states he has had episodes of emesis also.

## 2018-04-19 ENCOUNTER — Emergency Department (HOSPITAL_COMMUNITY)
Admission: EM | Admit: 2018-04-19 | Discharge: 2018-04-19 | Disposition: A | Discharge status: Home / Self Care | Payer: Self-pay | Attending: Emergency Medicine | Admitting: Emergency Medicine

## 2018-04-19 ENCOUNTER — Emergency Department (HOSPITAL_COMMUNITY): Payer: Self-pay

## 2018-04-19 ENCOUNTER — Encounter (HOSPITAL_COMMUNITY): Payer: Self-pay

## 2018-04-19 ENCOUNTER — Other Ambulatory Visit: Payer: Self-pay

## 2018-04-19 DIAGNOSIS — J4521 Mild intermittent asthma with (acute) exacerbation: Secondary | ICD-10-CM | POA: Insufficient documentation

## 2018-04-19 MED ORDER — ALBUTEROL SULFATE (2.5 MG/3ML) 0.083% IN NEBU: 2.5000 mg | INHALATION_SOLUTION | Freq: Four times a day (QID) | RESPIRATORY_TRACT | 12 refills | Status: DC | PRN

## 2018-04-19 MED ORDER — PREDNISONE 20 MG PO TABS
60.0000 mg | ORAL_TABLET | Freq: Once | ORAL | Status: AC
  Administered 2018-04-19: 60 mg via ORAL
  Filled 2018-04-19: qty 3

## 2018-04-19 MED ORDER — OPTICHAMBER DIAMOND MISC
1.0000 | Freq: Once | Status: DC
  Filled 2018-04-19: qty 1

## 2018-04-19 MED ORDER — PREDNISONE 10 MG (21) PO TBPK: ORAL_TABLET | Freq: Every day | ORAL | 0 refills | Status: DC

## 2018-04-19 MED ORDER — ALBUTEROL SULFATE HFA 108 (90 BASE) MCG/ACT IN AERS
1.0000 | INHALATION_SPRAY | Freq: Once | RESPIRATORY_TRACT | Status: AC
  Administered 2018-04-19: 1 via RESPIRATORY_TRACT
  Filled 2018-04-19: qty 6.7

## 2018-04-19 MED ORDER — IPRATROPIUM-ALBUTEROL 0.5-2.5 (3) MG/3ML IN SOLN
RESPIRATORY_TRACT | Status: AC
  Administered 2018-04-19: 1 mL
  Filled 2018-04-19: qty 3

## 2018-04-19 NOTE — ED Triage Notes (Signed)
Pt c/o SOB and wheezing that started this morning. Pt has hx of asthma. Pt able to speak in complete sentences.

## 2018-04-19 NOTE — ED Provider Notes (Signed)
[facility] EMERGENCY DEPARTMENT Provider Note   CSN: [id number] Arrival date & time: 04/19/18  1007     History   Chief Complaint Chief Complaint  Patient presents with  . Asthma    HPI Devin [name] is a 37 y.o. male with a past medical history of asthma who presents to ED for asthma exacerbation for the past 4 hours.  States that he had influenza last week.  He ran out of his solution for nebulizer machine this morning.  He reports chest tightness, wheezing and intermittent cough.  Symptoms are similar to prior asthma exacerbations.  He denies any chest pain, hemoptysis, leg swelling, fever.  HPI  Past Medical History:  Diagnosis Date  . ADHD (attention deficit hyperactivity disorder)   . Asthma   . GERD (gastroesophageal reflux disease)   . Transaminitis 07/17/2015    Patient Active Problem List   Diagnosis Date Noted  . Substance induced mood disorder (HCC) 03/07/2018  . Depression   . 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 Bipolar II disorder (HCC) 01/31/2018  . Hypoxia   . COPD exacerbation (HCC)   . Leukocytosis 11/04/2015  . Transaminitis 07/17/2015  . Tooth ache 07/17/2015  . Asthma with status asthmaticus 10/14/2011  . ADHD (attention deficit hyperactivity disorder) 10/14/2011    Past Surgical History:  Procedure Laterality Date  . TYMPANOPLASTY Left 1997        Home Medications    Prior to Admission medications   Medication Sig Start Date End Date Taking? Authorizing Provider  acetaminophen (TYLENOL) 500 MG tablet Take 1 tablet (500 mg total) by mouth every 6 (six) hours as needed. 04/08/18   Law, Waylan Boga, PA-C  albuterol (PROVENTIL) (2.5 MG/3ML) 0.083% nebulizer solution Take 3 mLs (2.5 mg total) by nebulization every 6 (six) hours as needed for wheezing or shortness of breath. 04/19/18   Cherita Hebel, PA-C  azithromycin (ZITHROMAX) 250 MG tablet Take 1 tablet (250 mg total)  by mouth daily. Take first 2 tablets together, then 1 every day until finished. 04/08/18   Law, Waylan Boga, PA-C  fluticasone (FLOVENT HFA) 44 MCG/ACT inhaler Inhale 2 puffs into the lungs 2 (two) times daily. For asthma Patient not taking: Reported on 04/08/2018 02/09/18   Armandina Stammer I, NP  gabapentin (NEURONTIN) 400 MG capsule Take 1 capsule (400 mg total) by mouth 3 (three) times daily. For agitation Patient not taking: Reported on 04/08/2018 02/09/18   Armandina Stammer I, NP  hydrOXYzine (ATARAX/VISTARIL) 25 MG tablet Take 1 tablet (25 mg) by mouth three times daily & 2 tablets (50 mg) at bedtime for anxiety/sleep Patient not taking: Reported on 04/08/2018 02/09/18   Armandina Stammer I, NP  mirtazapine (REMERON) 30 MG tablet Take 1 tablet (30 mg total) by mouth at bedtime. For depression Patient not taking: Reported on 04/08/2018 02/09/18   Armandina Stammer I, NP  OLANZapine (ZYPREXA) 2.5 MG tablet Take 1 tablet (2.5 mg total) by mouth at bedtime. For mood control Patient not taking: Reported on 04/08/2018 02/09/18   Armandina Stammer I, NP  ondansetron (ZOFRAN) 4 MG tablet Take 1 tablet (4 mg total) by mouth every 6 (six) hours. 04/08/18   Law, Waylan Boga, PA-C  pantoprazole (PROTONIX) 40 MG tablet Take 1 tablet (40 mg total) by mouth daily. For acid reflux Patient not taking: Reported on 04/08/2018 02/10/18   Armandina Stammer I, NP  predniSONE (STERAPRED UNI-PAK 21 TAB) 10 MG (21) TBPK tablet Take by mouth  daily. Take 6 tabs by mouth daily  for 1 days, then 5 tabs for 2 days, then 4 tabs for 2 days, then 3 tabs for 2 days, 2 tabs for 2 days, then 1 tab by mouth daily for 2 days 04/19/18   Dietrich PatesKhatri, Dorianne Perret, PA-C    Family History Family History  Problem Relation Age of Onset  . Coronary artery disease Father   . Heart attack Father   . Heart disease Father   . Heart failure Mother   . Hyperlipidemia Mother   . Hypertension Mother   . Migraines Mother   . COPD Mother   . Diabetes Maternal Aunt   . Asthma  Maternal Grandfather   . Asthma Paternal Grandfather     Social History Social History   Tobacco Use  . Smoking status: Current Every Day Smoker    Packs/day: 0.50    Types: Cigarettes  . Smokeless tobacco: Never Used  Substance Use Topics  . Alcohol use: Yes    Alcohol/week: 0.0 standard drinks  . Drug use: Yes    Types: Marijuana, Cocaine    Comment:                       Allergies   Augmentin [amoxicillin-pot clavulanate] and Other   Review of Systems Review of Systems  Constitutional: Negative for chills and fever.  HENT: Negative for congestion.   Respiratory: Positive for cough, chest tightness and shortness of breath.   Cardiovascular: Negative for chest pain.     Physical Exam Updated Vital Signs BP 110/83 (BP Location: Right Arm)   Pulse 68   Temp 98.3 F (36.8 C) (Oral)   Resp 16   Ht 5\' 9"  (1.753 m)   Wt 87.5 kg   SpO2 100%   BMI 28.49 kg/m   Physical Exam Vitals signs and nursing note reviewed.  Constitutional:      General: He is not in acute distress.    Appearance: He is well-developed. He is not diaphoretic.  HENT:     Head: Normocephalic and atraumatic.  Eyes:     General: No scleral icterus.    Conjunctiva/sclera: Conjunctivae normal.  Neck:     Musculoskeletal: Normal range of motion.  Cardiovascular:     Rate and Rhythm: Normal rate and regular rhythm.     Heart sounds: Normal heart sounds.  Pulmonary:     Effort: Pulmonary effort is normal. No respiratory distress.     Breath sounds: Wheezing (minimal, bilateral, end expiratory) present.  Skin:    Findings: No rash.  Neurological:     Mental Status: He is alert.      ED Treatments / Results  Labs (all labs ordered are listed, but only abnormal results are displayed) Labs Reviewed - No data to display  EKG None  Radiology Dg Chest 2 View  Result Date: 04/19/2018 CLINICAL DATA:  Patient with shortness of breath and wheezing EXAM: CHEST - 2 VIEW COMPARISON:  Chest  radiograph 04/08/2018 FINDINGS: Normal cardiac and mediastinal contours. No consolidative pulmonary opacities. No pleural effusion or pneumothorax. Regional skeleton is unremarkable. IMPRESSION: No acute cardiopulmonary process. Electronically Signed   By: Annia Beltrew  Davis M.D.   On: 04/19/2018 11:46    Procedures Procedures (including critical care time)  Medications Ordered in ED Medications  albuterol (PROVENTIL HFA;VENTOLIN HFA) 108 (90 Base) MCG/ACT inhaler 1 puff (has no administration in time range)  AEROCHAMBER PLUS FLO-VU MEDIUM MISC 1 each (has no administration in time  range)  ipratropium-albuterol (DUONEB) 0.5-2.5 (3) MG/3ML nebulizer solution (1 mL  Given 04/19/18 1021)  predniSONE (DELTASONE) tablet 60 mg (60 mg Oral Given 04/19/18 1038)     Initial Impression / Assessment and Plan / ED Course  I have reviewed the triage vital signs and the nursing notes.  Pertinent labs & imaging results that were available during my care of the patient were reviewed by me and considered in my medical decision making (see chart for details).     37 year old male with past medical history of asthma presents to ED for asthma exacerbation that began this morning.  He ran out of his nebulizer solution.  He had influenza-like illness last week.  Denies any fevers, productive cough.  On my exam there is mild chest tightness and wheezing noted in bilateral lung fields.  Vital signs within normal limits.  Chest x-ray is unremarkable.  Patient given DuoNeb and prednisone with significant improvement in his symptoms.  Will discharge home with remainder of steroid Dosepak, refill of inhaler and nebulizer solution.  Will advise him to return to ED for any severe worsening symptoms.  Patient is hemodynamically stable, in NAD, and able to ambulate in the ED. Evaluation does not show pathology that would require ongoing emergent intervention or inpatient treatment. I explained the diagnosis to the patient. Pain  has been managed and has no complaints prior to discharge. Patient is comfortable with above plan and is stable for discharge at this time. All questions were answered prior to disposition. Strict return precautions for returning to the ED were discussed. Encouraged follow up with PCP.    Portions of this note were generated with Scientist, clinical (histocompatibility and immunogenetics)Dragon dictation software. Dictation errors may occur despite best attempts at proofreading.   Final Clinical Impressions(s) / ED Diagnoses   Final diagnoses:  Mild intermittent asthma with acute exacerbation    ED Discharge Orders         Ordered    albuterol (PROVENTIL) (2.5 MG/3ML) 0.083% nebulizer solution  Every 6 hours PRN     04/19/18 1153    predniSONE (STERAPRED UNI-PAK 21 TAB) 10 MG (21) TBPK tablet  Daily     04/19/18 1153           Dietrich PatesKhatri, Danique Hartsough, PA-C 04/19/18 1155    Mesner, Barbara CowerJason, MD 04/19/18 1334

## 2018-04-19 NOTE — ED Notes (Signed)
Patient transported to X-ray 

## 2018-04-19 NOTE — Discharge Instructions (Addendum)
Return to ED for worsening symptoms, chest pain, vomiting or coughing up blood.

## 2018-05-05 ENCOUNTER — Emergency Department (HOSPITAL_COMMUNITY)
Admission: EM | Admit: 2018-05-05 | Discharge: 2018-05-05 | Disposition: A | Discharge status: Home / Self Care | Payer: Self-pay | Attending: Emergency Medicine | Admitting: Emergency Medicine

## 2018-05-05 ENCOUNTER — Encounter (HOSPITAL_COMMUNITY): Payer: Self-pay

## 2018-05-05 ENCOUNTER — Other Ambulatory Visit: Payer: Self-pay

## 2018-05-05 DIAGNOSIS — F17210 Nicotine dependence, cigarettes, uncomplicated: Secondary | ICD-10-CM | POA: Insufficient documentation

## 2018-05-05 DIAGNOSIS — J4521 Mild intermittent asthma with (acute) exacerbation: Secondary | ICD-10-CM | POA: Insufficient documentation

## 2018-05-05 HISTORY — DX: Bipolar disorder, unspecified: F31.9

## 2018-05-05 MED ORDER — ALBUTEROL SULFATE HFA 108 (90 BASE) MCG/ACT IN AERS
1.0000 | INHALATION_SPRAY | RESPIRATORY_TRACT | Status: DC | PRN
  Filled 2018-05-05: qty 6.7

## 2018-05-05 MED ORDER — ALBUTEROL SULFATE (2.5 MG/3ML) 0.083% IN NEBU
5.0000 mg | INHALATION_SOLUTION | Freq: Once | RESPIRATORY_TRACT | Status: AC
  Administered 2018-05-05: 5 mg via RESPIRATORY_TRACT
  Filled 2018-05-05: qty 6

## 2018-05-05 MED ORDER — PREDNISONE 20 MG PO TABS: ORAL_TABLET | ORAL | 0 refills | Status: DC

## 2018-05-05 MED ORDER — METHYLPREDNISOLONE SODIUM SUCC 125 MG IJ SOLR
125.0000 mg | Freq: Once | INTRAMUSCULAR | Status: AC
  Administered 2018-05-05: 125 mg via INTRAVENOUS
  Filled 2018-05-05: qty 2

## 2018-05-05 NOTE — ED Notes (Signed)
Patient verbalizes understanding of discharge instructions. Opportunity for questioning and answers were provided. Armband removed by staff, pt discharged from ED ambulatory.   

## 2018-05-05 NOTE — ED Provider Notes (Signed)
[facility] EMERGENCY [facility] Provider Note   CSN: [id number] Arrival date & time: 05/05/18  0741     History   Chief Complaint Chief Complaint  Patient presents with  . Shortness of Breath    HPI Devin KillingsMark Dingee is a 38 y.o. male.  HPI Patient presents with exacerbation of his asthma which began 3 to 4 hours ago.  States he ran out of his albuterol inhaler.  Endorses wheezing and cough.  No fever or chills. Past Medical History:  Diagnosis Date  . ADHD (attention deficit hyperactivity disorder)   . Asthma   . Bipolar 1 disorder (HCC)   . GERD (gastroesophageal reflux disease)   . Transaminitis 07/17/2015    Patient Active Problem List   Diagnosis Date Noted  . Substance induced mood disorder (HCC) 03/07/2018  . Depression   . 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 Bipolar II disorder (HCC) 01/31/2018  . Hypoxia   . COPD exacerbation (HCC)   . Leukocytosis 11/04/2015  . Transaminitis 07/17/2015  . Tooth ache 07/17/2015  . Asthma with status asthmaticus 10/14/2011  . ADHD (attention deficit hyperactivity disorder) 10/14/2011    Past Surgical History:  Procedure Laterality Date  . TYMPANOPLASTY Left 1997        Home Medications    Prior to Admission medications   Medication Sig Start Date End Date Taking? Authorizing Provider  acetaminophen (TYLENOL) 500 MG tablet Take 1 tablet (500 mg total) by mouth every 6 (six) hours as needed. 04/08/18   Law, Waylan BogaAlexandra M, PA-C  albuterol (PROVENTIL) (2.5 MG/3ML) 0.083% nebulizer solution Take 3 mLs (2.5 mg total) by nebulization every 6 (six) hours as needed for wheezing or shortness of breath. 04/19/18   Khatri, Hina, PA-C  azithromycin (ZITHROMAX) 250 MG tablet Take 1 tablet (250 mg total) by mouth daily. Take first 2 tablets together, then 1 every day until finished. 04/08/18   Law, Waylan BogaAlexandra M, PA-C  fluticasone (FLOVENT HFA) 44 MCG/ACT inhaler Inhale  2 puffs into the lungs 2 (two) times daily. For asthma Patient not taking: Reported on 04/08/2018 02/09/18   Armandina StammerNwoko, Agnes I, NP  gabapentin (NEURONTIN) 400 MG capsule Take 1 capsule (400 mg total) by mouth 3 (three) times daily. For agitation Patient not taking: Reported on 04/08/2018 02/09/18   Armandina StammerNwoko, Agnes I, NP  hydrOXYzine (ATARAX/VISTARIL) 25 MG tablet Take 1 tablet (25 mg) by mouth three times daily & 2 tablets (50 mg) at bedtime for anxiety/sleep Patient not taking: Reported on 04/08/2018 02/09/18   Armandina StammerNwoko, Agnes I, NP  mirtazapine (REMERON) 30 MG tablet Take 1 tablet (30 mg total) by mouth at bedtime. For depression Patient not taking: Reported on 04/08/2018 02/09/18   Armandina StammerNwoko, Agnes I, NP  OLANZapine (ZYPREXA) 2.5 MG tablet Take 1 tablet (2.5 mg total) by mouth at bedtime. For mood control Patient not taking: Reported on 04/08/2018 02/09/18   Armandina StammerNwoko, Agnes I, NP  ondansetron (ZOFRAN) 4 MG tablet Take 1 tablet (4 mg total) by mouth every 6 (six) hours. 04/08/18   Law, Waylan BogaAlexandra M, PA-C  pantoprazole (PROTONIX) 40 MG tablet Take 1 tablet (40 mg total) by mouth daily. For acid reflux Patient not taking: Reported on 04/08/2018 02/10/18   Armandina StammerNwoko, Agnes I, NP  predniSONE (DELTASONE) 20 MG tablet 3 tabs po day one, then 2 po daily x 4 days 05/05/18   Loren RacerYelverton, Rocklin Soderquist, MD    Family History Family History  Problem Relation Age of Onset  .  Coronary artery disease Father   . Heart attack Father   . Heart disease Father   . Heart failure Mother   . Hyperlipidemia Mother   . Hypertension Mother   . Migraines Mother   . COPD Mother   . Diabetes Maternal Aunt   . Asthma Maternal Grandfather   . Asthma Paternal Grandfather     Social History Social History   Tobacco Use  . Smoking status: Current Every Day Smoker    Packs/day: 0.50    Types: Cigarettes  . Smokeless tobacco: Never Used  Substance Use Topics  . Alcohol use: Yes    Alcohol/week: 0.0 standard drinks  . Drug use: Yes     Types: Marijuana, Cocaine    Comment:                       Allergies   Augmentin [amoxicillin-pot clavulanate] and Other   Review of Systems Review of Systems  Constitutional: Negative for chills and fever.  Respiratory: Positive for cough, shortness of breath and wheezing.   Cardiovascular: Negative for chest pain and leg swelling.  Skin: Negative for rash.  Neurological: Negative for weakness, light-headedness, numbness and headaches.  All other systems reviewed and are negative.    Physical Exam Updated Vital Signs BP 112/71   Pulse 78   Temp 97.9 F (36.6 C) (Oral)   Resp 18   Ht 5\' 9"  (1.753 m)   Wt 85.7 kg   SpO2 98%   BMI 27.91 kg/m   Physical Exam Vitals signs and nursing note reviewed.  Constitutional:      General: He is not in acute distress.    Appearance: Normal appearance. He is well-developed.  HENT:     Head: Normocephalic and atraumatic.     Nose: Nose normal.     Mouth/Throat:     Mouth: Mucous membranes are moist.  Eyes:     Extraocular Movements: Extraocular movements intact.     Pupils: Pupils are equal, round, and reactive to light.  Neck:     Musculoskeletal: Normal range of motion and neck supple.  Cardiovascular:     Rate and Rhythm: Normal rate and regular rhythm.     Heart sounds: No murmur. No friction rub. No gallop.   Pulmonary:     Effort: Pulmonary effort is normal.     Breath sounds: Wheezing present.     Comments: Scattered expiratory wheezes.  No respiratory distress. Abdominal:     General: Bowel sounds are normal.     Palpations: Abdomen is soft.     Tenderness: There is no abdominal tenderness. There is no guarding or rebound.  Musculoskeletal: Normal range of motion.        General: No swelling, tenderness, deformity or signs of injury.     Right lower leg: No edema.     Left lower leg: No edema.  Skin:    General: Skin is warm and dry.     Capillary Refill: Capillary refill takes less than 2 seconds.      Findings: No erythema or rash.  Neurological:     General: No focal deficit present.     Mental Status: He is alert and oriented to person, place, and time.  Psychiatric:        Behavior: Behavior normal.     Comments: Mildly anxious      ED Treatments / Results  Labs (all labs ordered are listed, but only abnormal results are displayed) Labs  Reviewed - No data to display  EKG None  Radiology No results found.  Procedures Procedures (including critical care time)  Medications Ordered in ED Medications  albuterol (PROVENTIL HFA;VENTOLIN HFA) 108 (90 Base) MCG/ACT inhaler 1-2 puff (has no administration in time range)  methylPREDNISolone sodium succinate (SOLU-MEDROL) 125 mg/2 mL injection 125 mg (125 mg Intravenous Given 05/05/18 0909)  albuterol (PROVENTIL) (2.5 MG/3ML) 0.083% nebulizer solution 5 mg (5 mg Nebulization Given 05/05/18 0909)     Initial Impression / Assessment and Plan / ED Course  I have reviewed the triage vital signs and the nursing notes.  Pertinent labs & imaging results that were available during my care of the patient were reviewed by me and considered in my medical decision making (see chart for details).     Wheezing has improved after breathing treatment.  Given albuterol inhaler in the emergency department and will discharge with short course of prednisone.  Return precautions given.  Final Clinical Impressions(s) / ED Diagnoses   Final diagnoses:  Mild intermittent asthma with exacerbation    ED Discharge Orders         Ordered    predniSONE (DELTASONE) 20 MG tablet     05/05/18 1028           Loren Racer, MD 05/05/18 1036

## 2018-05-05 NOTE — ED Triage Notes (Signed)
Pt c/o sob that started around 3am today ; pt states he has asthma and ran out of his inhaler x 2 days ago ;pt states " this feels like my asthma flaring up "

## 2018-06-04 ENCOUNTER — Other Ambulatory Visit: Payer: Self-pay

## 2018-06-04 ENCOUNTER — Emergency Department (HOSPITAL_COMMUNITY)
Admission: EM | Admit: 2018-06-04 | Discharge: 2018-06-04 | Disposition: A | Discharge status: Home / Self Care | Payer: Self-pay | Attending: Emergency Medicine | Admitting: Emergency Medicine

## 2018-06-04 DIAGNOSIS — J45901 Unspecified asthma with (acute) exacerbation: Secondary | ICD-10-CM | POA: Insufficient documentation

## 2018-06-04 DIAGNOSIS — Z209 Contact with and (suspected) exposure to unspecified communicable disease: Secondary | ICD-10-CM | POA: Insufficient documentation

## 2018-06-04 DIAGNOSIS — F17210 Nicotine dependence, cigarettes, uncomplicated: Secondary | ICD-10-CM | POA: Insufficient documentation

## 2018-06-04 DIAGNOSIS — R112 Nausea with vomiting, unspecified: Secondary | ICD-10-CM | POA: Insufficient documentation

## 2018-06-04 LAB — CBC
HCT: 45.5 % (ref 39.0–52.0)
Hemoglobin: 15.1 g/dL (ref 13.0–17.0)
MCH: 29.1 pg (ref 26.0–34.0)
MCHC: 33.2 g/dL (ref 30.0–36.0)
MCV: 87.7 fL (ref 80.0–100.0)
Platelets: 388 10*3/uL (ref 150–400)
RBC: 5.19 MIL/uL (ref 4.22–5.81)
RDW: 12.3 % (ref 11.5–15.5)
WBC: 10.3 10*3/uL (ref 4.0–10.5)
nRBC: 0 % (ref 0.0–0.2)

## 2018-06-04 LAB — COMPREHENSIVE METABOLIC PANEL
ALK PHOS: 47 U/L (ref 38–126)
ALT: 31 U/L (ref 0–44)
AST: 25 U/L (ref 15–41)
Albumin: 4.1 g/dL (ref 3.5–5.0)
Anion gap: 7 (ref 5–15)
BILIRUBIN TOTAL: 1 mg/dL (ref 0.3–1.2)
BUN: 15 mg/dL (ref 6–20)
CO2: 21 mmol/L — ABNORMAL LOW (ref 22–32)
Calcium: 9.1 mg/dL (ref 8.9–10.3)
Chloride: 109 mmol/L (ref 98–111)
Creatinine, Ser: 1.04 mg/dL (ref 0.61–1.24)
GFR calc Af Amer: >60 mL/min (ref >60)
GFR calc non Af Amer: >60 mL/min (ref >60)
Glucose, Bld: 102 mg/dL — ABNORMAL HIGH (ref 70–99)
Potassium: 4 mmol/L (ref 3.5–5.1)
Sodium: 137 mmol/L (ref 135–145)
TOTAL PROTEIN: 7.2 g/dL (ref 6.5–8.1)

## 2018-06-04 LAB — LIPASE, BLOOD: Lipase: 26 U/L (ref 11–51)

## 2018-06-04 MED ORDER — PROMETHAZINE HCL 25 MG PO TABS: 25.0000 mg | ORAL_TABLET | Freq: Four times a day (QID) | ORAL | 0 refills | Status: DC | PRN

## 2018-06-04 MED ORDER — ALBUTEROL SULFATE HFA 108 (90 BASE) MCG/ACT IN AERS
2.0000 | INHALATION_SPRAY | RESPIRATORY_TRACT | Status: DC | PRN
  Administered 2018-06-04: 2 via RESPIRATORY_TRACT
  Filled 2018-06-04: qty 6.7

## 2018-06-04 MED ORDER — SODIUM CHLORIDE 0.9% FLUSH: 3.0000 mL | Freq: Once | INTRAVENOUS | Status: DC

## 2018-06-04 NOTE — Discharge Instructions (Addendum)
Follow-up with your primary care doctor to help manage her asthma.

## 2018-06-04 NOTE — ED Triage Notes (Signed)
Pt in c/o SOB after being out of inhaler x 1 wk, pt reports vomiting onset today x 3 with family having similar symptoms, pt denies diarrhea, pt A&O x4

## 2018-06-04 NOTE — ED Notes (Signed)
Patient verbalizes understanding of discharge instructions. Opportunity for questioning and answers were provided. Armband removed by staff, pt discharged from ED.  

## 2018-06-04 NOTE — ED Provider Notes (Signed)
[facility] EMERGENCY DEPARTMENT Provider Note   CSN: [id number] Arrival date & time: 06/04/18  1015     History   Chief Complaint Chief Complaint  Patient presents with  . Emesis    HPI Devin [name] is a 38 y.o. male.  HPI Patient developed nausea and vomiting.  States he has 2 children at home with similar symptoms.  No diarrhea.  No abdominal pain.  States that their symptoms last around 24 hours.  No chest pain or fevers.  States he does have some myalgias. Also has had cough and some wheezing.  States feels that his asthma is flaring up.  States he is out of his inhaler.  Does not have a primary care doctor and has had frequent visits to the ER for the asthma.  Only mild shortness of breath. Past Medical History:  Diagnosis Date  . ADHD (attention deficit hyperactivity disorder)   . Asthma   . Bipolar 1 disorder (HCC)   . GERD (gastroesophageal reflux disease)   . Transaminitis 07/17/2015    Patient Active Problem List   Diagnosis Date Noted  . Substance induced mood disorder (HCC) 03/07/2018  . Depression   . 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 Bipolar II disorder (HCC) 01/31/2018  . Hypoxia   . COPD exacerbation (HCC)   . Leukocytosis 11/04/2015  . Transaminitis 07/17/2015  . Tooth ache 07/17/2015  . Asthma with status asthmaticus 10/14/2011  . ADHD (attention deficit hyperactivity disorder) 10/14/2011    Past Surgical History:  Procedure Laterality Date  . TYMPANOPLASTY Left 1997        Home Medications    Prior to Admission medications   Medication Sig Start Date End Date Taking? Authorizing Provider  acetaminophen (TYLENOL) 500 MG tablet Take 1 tablet (500 mg total) by mouth every 6 (six) hours as needed. 04/08/18   Law, Waylan Boga, PA-C  albuterol (PROVENTIL) (2.5 MG/3ML) 0.083% nebulizer solution Take 3 mLs (2.5 mg total) by nebulization every 6 (six) hours as needed for  wheezing or shortness of breath. 04/19/18   Khatri, Hina, PA-C  azithromycin (ZITHROMAX) 250 MG tablet Take 1 tablet (250 mg total) by mouth daily. Take first 2 tablets together, then 1 every day until finished. 04/08/18   Law, Waylan Boga, PA-C  fluticasone (FLOVENT HFA) 44 MCG/ACT inhaler Inhale 2 puffs into the lungs 2 (two) times daily. For asthma Patient not taking: Reported on 04/08/2018 02/09/18   Armandina Stammer I, NP  gabapentin (NEURONTIN) 400 MG capsule Take 1 capsule (400 mg total) by mouth 3 (three) times daily. For agitation Patient not taking: Reported on 04/08/2018 02/09/18   Armandina Stammer I, NP  hydrOXYzine (ATARAX/VISTARIL) 25 MG tablet Take 1 tablet (25 mg) by mouth three times daily & 2 tablets (50 mg) at bedtime for anxiety/sleep Patient not taking: Reported on 04/08/2018 02/09/18   Armandina Stammer I, NP  mirtazapine (REMERON) 30 MG tablet Take 1 tablet (30 mg total) by mouth at bedtime. For depression Patient not taking: Reported on 04/08/2018 02/09/18   Armandina Stammer I, NP  OLANZapine (ZYPREXA) 2.5 MG tablet Take 1 tablet (2.5 mg total) by mouth at bedtime. For mood control Patient not taking: Reported on 04/08/2018 02/09/18   Armandina Stammer I, NP  ondansetron (ZOFRAN) 4 MG tablet Take 1 tablet (4 mg total) by mouth every 6 (six) hours. 04/08/18   Law, Waylan Boga, PA-C  pantoprazole (PROTONIX) 40 MG tablet Take 1 tablet (40 mg  total) by mouth daily. For acid reflux Patient not taking: Reported on 04/08/2018 02/10/18   Armandina StammerNwoko, Agnes I, NP  predniSONE (DELTASONE) 20 MG tablet 3 tabs po day one, then 2 po daily x 4 days 05/05/18   Loren RacerYelverton, David, MD  promethazine (PHENERGAN) 25 MG tablet Take 1 tablet (25 mg total) by mouth every 6 (six) hours as needed for nausea. 06/04/18   Benjiman CorePickering, Demani Weyrauch, MD    Family History Family History  Problem Relation Age of Onset  . Coronary artery disease Father   . Heart attack Father   . Heart disease Father   . Heart failure Mother   .  Hyperlipidemia Mother   . Hypertension Mother   . Migraines Mother   . COPD Mother   . Diabetes Maternal Aunt   . Asthma Maternal Grandfather   . Asthma Paternal Grandfather     Social History Social History   Tobacco Use  . Smoking status: Current Every Day Smoker    Packs/day: 0.50    Types: Cigarettes  . Smokeless tobacco: Never Used  Substance Use Topics  . Alcohol use: Yes    Alcohol/week: 0.0 standard drinks  . Drug use: Yes    Types: Marijuana, Cocaine    Comment:                       Allergies   Augmentin [amoxicillin-pot clavulanate] and Other   Review of Systems Review of Systems  Constitutional: Positive for appetite change.  HENT: Negative for congestion.   Respiratory: Positive for cough and wheezing.   Cardiovascular: Negative for chest pain.  Gastrointestinal: Positive for nausea and vomiting. Negative for abdominal pain and diarrhea.  Genitourinary: Negative for flank pain.  Musculoskeletal: Negative for gait problem.  Neurological: Negative for weakness.     Physical Exam Updated Vital Signs BP (!) 133/92 (BP Location: Right Arm)   Pulse 71   Temp 98.4 F (36.9 C) (Oral)   Resp 16   Ht 5\' 9"  (1.753 m)   Wt 93 kg   SpO2 95%   BMI 30.27 kg/m   Physical Exam Vitals signs and nursing note reviewed.  HENT:     Head: Atraumatic.     Nose: Nose normal.  Neck:     Musculoskeletal: Neck supple.  Cardiovascular:     Rate and Rhythm: Regular rhythm.  Pulmonary:     Effort: Pulmonary effort is normal.     Comments: Mild diffuse wheezes and prolonged expirations.  No focal rales or rhonchi. Abdominal:     Tenderness: There is no abdominal tenderness.  Musculoskeletal:     Right lower leg: No edema.     Left lower leg: No edema.  Skin:    General: Skin is warm.     Capillary Refill: Capillary refill takes less than 2 seconds.  Neurological:     General: No focal deficit present.     Mental Status: He is alert.      ED Treatments /  Results  Labs (all labs ordered are listed, but only abnormal results are displayed) Labs Reviewed  COMPREHENSIVE METABOLIC PANEL - Abnormal; Notable for the following components:      Result Value   CO2 21 (*)    Glucose, Bld 102 (*)    All other components within normal limits  LIPASE, BLOOD  CBC  URINALYSIS, ROUTINE W REFLEX MICROSCOPIC    EKG None  Radiology No results found.  Procedures Procedures (including critical care time)  Medications Ordered in ED Medications  sodium chloride flush (NS) 0.9 % injection 3 mL (has no administration in time range)  albuterol (PROVENTIL HFA;VENTOLIN HFA) 108 (90 Base) MCG/ACT inhaler 2 puff (has no administration in time range)     Initial Impression / Assessment and Plan / ED Course  I have reviewed the triage vital signs and the nursing notes.  Pertinent labs & imaging results that were available during my care of the patient were reviewed by me and considered in my medical decision making (see chart for details).     Patient with nausea vomiting.  Daughters have similar symptoms.  Lab work done under protocol is reassuring.  Will discharge with Phenergan.  Has dyspnea to.  Will give inhaler since he is out of this at home.  Instructed on need for PCP follow-up.  Needs better as well trial.  Discharge home.  Final Clinical Impressions(s) / ED Diagnoses   Final diagnoses:  Non-intractable vomiting with nausea, unspecified vomiting type  Exacerbation of asthma, unspecified asthma severity, unspecified whether persistent    ED Discharge Orders         Ordered    promethazine (PHENERGAN) 25 MG tablet  Every 6 hours PRN     06/04/18 1244           Benjiman Core, MD 06/04/18 1247

## 2018-06-12 ENCOUNTER — Emergency Department (HOSPITAL_COMMUNITY)
Admission: EM | Admit: 2018-06-12 | Discharge: 2018-06-12 | Disposition: A | Discharge status: Home / Self Care | Payer: Self-pay | Attending: Emergency Medicine | Admitting: Emergency Medicine

## 2018-06-12 ENCOUNTER — Encounter (HOSPITAL_COMMUNITY): Payer: Self-pay | Admitting: *Deleted

## 2018-06-12 ENCOUNTER — Other Ambulatory Visit: Payer: Self-pay

## 2018-06-12 DIAGNOSIS — J4541 Moderate persistent asthma with (acute) exacerbation: Secondary | ICD-10-CM | POA: Insufficient documentation

## 2018-06-12 DIAGNOSIS — F909 Attention-deficit hyperactivity disorder, unspecified type: Secondary | ICD-10-CM | POA: Insufficient documentation

## 2018-06-12 DIAGNOSIS — Z79899 Other long term (current) drug therapy: Secondary | ICD-10-CM | POA: Insufficient documentation

## 2018-06-12 DIAGNOSIS — F17210 Nicotine dependence, cigarettes, uncomplicated: Secondary | ICD-10-CM | POA: Insufficient documentation

## 2018-06-12 MED ORDER — IPRATROPIUM BROMIDE 0.02 % IN SOLN
0.5000 mg | Freq: Once | RESPIRATORY_TRACT | Status: AC
  Administered 2018-06-12: 0.5 mg via RESPIRATORY_TRACT
  Filled 2018-06-12: qty 2.5

## 2018-06-12 MED ORDER — PREDNISONE 20 MG PO TABS
60.0000 mg | ORAL_TABLET | Freq: Once | ORAL | Status: AC
  Administered 2018-06-12: 60 mg via ORAL
  Filled 2018-06-12: qty 3

## 2018-06-12 MED ORDER — PREDNISONE 20 MG PO TABS: 40.0000 mg | ORAL_TABLET | Freq: Every day | ORAL | 0 refills | Status: DC

## 2018-06-12 MED ORDER — ALBUTEROL SULFATE (2.5 MG/3ML) 0.083% IN NEBU
5.0000 mg | INHALATION_SOLUTION | Freq: Once | RESPIRATORY_TRACT | Status: AC
  Administered 2018-06-12: 5 mg via RESPIRATORY_TRACT
  Filled 2018-06-12: qty 6

## 2018-06-12 MED ORDER — ALBUTEROL SULFATE HFA 108 (90 BASE) MCG/ACT IN AERS
2.0000 | INHALATION_SPRAY | RESPIRATORY_TRACT | Status: DC | PRN
  Administered 2018-06-12: 2 via RESPIRATORY_TRACT
  Filled 2018-06-12: qty 6.7

## 2018-06-12 NOTE — ED Triage Notes (Signed)
Pt endorses sob x 2 hours.  Has hx of asthma.  Pt is able to compete a sentence.  He appears comfortable and is in NAD.  Denies any pain

## 2018-06-12 NOTE — ED Provider Notes (Signed)
[facility] EMERGENCY DEPARTMENT Provider Note   CSN: [id number] Arrival date & time: 06/12/18  1438    History   Chief Complaint No chief complaint on file.   HPI Devin [name] is a 38 y.o. male.     The history is provided by the patient.  Wheezing  Severity:  Moderate Severity compared to prior episodes:  More severe Onset quality:  Gradual Duration:  1 day Timing:  Constant Progression:  Worsening Chronicity:  Recurrent Context comment:  Ran out of inhaler and can't get the wheezing to stop Relieved by:  None tried Worsened by:  Activity Ineffective treatments:  None tried Associated symptoms: chest tightness, cough and shortness of breath   Associated symptoms: no chest pain, no fever, no headaches, no rash, no rhinorrhea, no sore throat and no sputum production   Risk factors: no prior ICU admissions, no prior intubations and no suspected foreign body     Past Medical History:  Diagnosis Date  . ADHD (attention deficit hyperactivity disorder)   . Asthma   . Bipolar 1 disorder (HCC)   . GERD (gastroesophageal reflux disease)   . Transaminitis 07/17/2015    Patient Active Problem List   Diagnosis Date Noted  . Substance induced mood disorder (HCC) 03/07/2018  . Depression   . 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 Bipolar II disorder (HCC) 01/31/2018  . Hypoxia   . COPD exacerbation (HCC)   . Leukocytosis 11/04/2015  . Transaminitis 07/17/2015  . Tooth ache 07/17/2015  . Asthma with status asthmaticus 10/14/2011  . ADHD (attention deficit hyperactivity disorder) 10/14/2011    Past Surgical History:  Procedure Laterality Date  . TYMPANOPLASTY Left 1997        Home Medications    Prior to Admission medications   Medication Sig Start Date End Date Taking? Authorizing Provider  acetaminophen (TYLENOL) 500 MG tablet Take 1 tablet (500 mg total) by mouth every 6 (six) hours as  needed. 04/08/18   Law, Waylan Boga, PA-C  albuterol (PROVENTIL) (2.5 MG/3ML) 0.083% nebulizer solution Take 3 mLs (2.5 mg total) by nebulization every 6 (six) hours as needed for wheezing or shortness of breath. 04/19/18   Khatri, Hina, PA-C  azithromycin (ZITHROMAX) 250 MG tablet Take 1 tablet (250 mg total) by mouth daily. Take first 2 tablets together, then 1 every day until finished. 04/08/18   Law, Waylan Boga, PA-C  fluticasone (FLOVENT HFA) 44 MCG/ACT inhaler Inhale 2 puffs into the lungs 2 (two) times daily. For asthma Patient not taking: Reported on 04/08/2018 02/09/18   Armandina Stammer I, NP  gabapentin (NEURONTIN) 400 MG capsule Take 1 capsule (400 mg total) by mouth 3 (three) times daily. For agitation Patient not taking: Reported on 04/08/2018 02/09/18   Armandina Stammer I, NP  hydrOXYzine (ATARAX/VISTARIL) 25 MG tablet Take 1 tablet (25 mg) by mouth three times daily & 2 tablets (50 mg) at bedtime for anxiety/sleep Patient not taking: Reported on 04/08/2018 02/09/18   Armandina Stammer I, NP  mirtazapine (REMERON) 30 MG tablet Take 1 tablet (30 mg total) by mouth at bedtime. For depression Patient not taking: Reported on 04/08/2018 02/09/18   Armandina Stammer I, NP  OLANZapine (ZYPREXA) 2.5 MG tablet Take 1 tablet (2.5 mg total) by mouth at bedtime. For mood control Patient not taking: Reported on 04/08/2018 02/09/18   Armandina Stammer I, NP  ondansetron (ZOFRAN) 4 MG tablet Take 1 tablet (4 mg total) by mouth every 6 (six)  hours. 04/08/18   Emi Holes, PA-C  pantoprazole (PROTONIX) 40 MG tablet Take 1 tablet (40 mg total) by mouth daily. For acid reflux Patient not taking: Reported on 04/08/2018 02/10/18   Armandina Stammer I, NP  predniSONE (DELTASONE) 20 MG tablet 3 tabs po day one, then 2 po daily x 4 days 05/05/18   Loren Racer, MD  promethazine (PHENERGAN) 25 MG tablet Take 1 tablet (25 mg total) by mouth every 6 (six) hours as needed for nausea. 06/04/18   Benjiman Core, MD    Family  History Family History  Problem Relation Age of Onset  . Coronary artery disease Father   . Heart attack Father   . Heart disease Father   . Heart failure Mother   . Hyperlipidemia Mother   . Hypertension Mother   . Migraines Mother   . COPD Mother   . Diabetes Maternal Aunt   . Asthma Maternal Grandfather   . Asthma Paternal Grandfather     Social History Social History   Tobacco Use  . Smoking status: Current Every Day Smoker    Packs/day: 0.50    Types: Cigarettes  . Smokeless tobacco: Never Used  Substance Use Topics  . Alcohol use: Yes    Alcohol/week: 0.0 standard drinks  . Drug use: Yes    Types: Marijuana, Cocaine    Comment:                       Allergies   Augmentin [amoxicillin-pot clavulanate] and Other   Review of Systems Review of Systems  Constitutional: Negative for fever.  HENT: Negative for rhinorrhea and sore throat.   Respiratory: Positive for cough, chest tightness, shortness of breath and wheezing. Negative for sputum production.   Cardiovascular: Negative for chest pain.  Skin: Negative for rash.  Neurological: Negative for headaches.  All other systems reviewed and are negative.    Physical Exam Updated Vital Signs BP (!) 129/91 (BP Location: Right Arm)   Pulse 88   Temp 98.1 F (36.7 C) (Oral)   Resp 16   SpO2 98%   Physical Exam Vitals signs and nursing note reviewed.  Constitutional:      General: He is not in acute distress.    Appearance: He is well-developed.  HENT:     Head: Normocephalic and atraumatic.     Right Ear: Tympanic membrane normal.     Left Ear: Tympanic membrane normal.     Nose: Nose normal.     Mouth/Throat:     Mouth: Mucous membranes are moist.     Pharynx: No posterior oropharyngeal erythema.  Eyes:     Conjunctiva/sclera: Conjunctivae normal.     Pupils: Pupils are equal, round, and reactive to light.  Neck:     Musculoskeletal: Normal range of motion and neck supple.  Cardiovascular:      Rate and Rhythm: Normal rate and regular rhythm.     Heart sounds: No murmur.  Pulmonary:     Effort: Tachypnea and accessory muscle usage present. No respiratory distress.     Breath sounds: Wheezing present. No rales.  Abdominal:     General: There is no distension.     Palpations: Abdomen is soft.     Tenderness: There is no abdominal tenderness. There is no guarding or rebound.  Musculoskeletal: Normal range of motion.        General: No tenderness.  Skin:    General: Skin is warm and dry.  Capillary Refill: Capillary refill takes less than 2 seconds.     Findings: No erythema or rash.  Neurological:     General: No focal deficit present.     Mental Status: He is alert and oriented to person, place, and time. Mental status is at baseline.  Psychiatric:        Mood and Affect: Mood normal.        Behavior: Behavior normal.      ED Treatments / Results  Labs (all labs ordered are listed, but only abnormal results are displayed) Labs Reviewed - No data to display  EKG None  Radiology No results found.  Procedures Procedures (including critical care time)  Medications Ordered in ED Medications  predniSONE (DELTASONE) tablet 60 mg (has no administration in time range)  albuterol (PROVENTIL) (2.5 MG/3ML) 0.083% nebulizer solution 5 mg (has no administration in time range)  ipratropium (ATROVENT) nebulizer solution 0.5 mg (has no administration in time range)     Initial Impression / Assessment and Plan / ED Course  I have reviewed the triage vital signs and the nursing notes.  Pertinent labs & imaging results that were available during my care of the patient were reviewed by me and considered in my medical decision making (see chart for details).       Pt with typical asthma exacerbation  symptoms.  No infectious sx, productive cough or other complaints.  Wheezing on exam.  will give steroids, albuterol/atrovent and recheck.  3:28 PM After 1 treatment pt is  feeling much better and wheezing resolved.  Final Clinical Impressions(s) / ED Diagnoses   Final diagnoses:  Moderate persistent asthma with exacerbation    ED Discharge Orders         Ordered    predniSONE (DELTASONE) 20 MG tablet  Daily     06/12/18 1529           Gwyneth SproutPlunkett, Deashia Soule, MD 06/12/18 1530

## 2018-06-30 ENCOUNTER — Encounter (HOSPITAL_COMMUNITY): Payer: Self-pay | Admitting: *Deleted

## 2018-06-30 ENCOUNTER — Emergency Department (HOSPITAL_COMMUNITY)
Admission: EM | Admit: 2018-06-30 | Discharge: 2018-06-30 | Disposition: A | Discharge status: Home / Self Care | Payer: Self-pay | Attending: Emergency Medicine | Admitting: Emergency Medicine

## 2018-06-30 DIAGNOSIS — J441 Chronic obstructive pulmonary disease with (acute) exacerbation: Secondary | ICD-10-CM | POA: Insufficient documentation

## 2018-06-30 DIAGNOSIS — R1111 Vomiting without nausea: Secondary | ICD-10-CM

## 2018-06-30 DIAGNOSIS — F909 Attention-deficit hyperactivity disorder, unspecified type: Secondary | ICD-10-CM | POA: Insufficient documentation

## 2018-06-30 DIAGNOSIS — R112 Nausea with vomiting, unspecified: Secondary | ICD-10-CM | POA: Insufficient documentation

## 2018-06-30 NOTE — ED Provider Notes (Signed)
[facility] EMERGENCY DEPARTMENT Provider Note   CSN: [id number] Arrival date & time: 06/30/18  1027    History   Chief Complaint Chief Complaint  Patient presents with  . Emesis    HPI Devin [name] is a 38 y.o. male presenting for evaluation of N/V.   Pt states he has had 2 episodes of emesis today.  First episode was this morning before work but he was lying on his stomach.  Second episode was after the car ride to work.  As he threw up at work, he was sent home and told that he needs a work note to return.  Patient reports prior to each episode of vomiting, he had minimal nausea.  He has no symptoms currently.  He denies fevers, chills, chest pain, shortness of breath, nausea, abdominal pain, urinary symptoms, normal bowel movements.  He denies sick contacts.  He denies recent travel.  He has not taken anything for his symptoms.  He denies history of abdominal problems or surgeries.  Patient states that he occasionally has reflux symptoms, but does not take anything for this.  He continues to eat spicy and fatty foods.  Patient states he ate spicy food last night just prior to laying down for sleep.     HPI  Past Medical History:  Diagnosis Date  . ADHD (attention deficit hyperactivity disorder)   . Asthma   . Bipolar 1 disorder (HCC)   . GERD (gastroesophageal reflux disease)   . Transaminitis 07/17/2015    Patient Active Problem List   Diagnosis Date Noted  . Substance induced mood disorder (HCC) 03/07/2018  . Depression   . 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 Bipolar II disorder (HCC) 01/31/2018  . Hypoxia   . COPD exacerbation (HCC)   . Leukocytosis 11/04/2015  . Transaminitis 07/17/2015  . Tooth ache 07/17/2015  . Asthma with status asthmaticus 10/14/2011  . ADHD (attention deficit hyperactivity disorder) 10/14/2011    Past Surgical History:  Procedure Laterality Date  . TYMPANOPLASTY Left  1997        Home Medications    Prior to Admission medications   Medication Sig Start Date End Date Taking? Authorizing Provider  acetaminophen (TYLENOL) 500 MG tablet Take 1 tablet (500 mg total) by mouth every 6 (six) hours as needed. 04/08/18   Law, Waylan Boga, PA-C  albuterol (PROVENTIL) (2.5 MG/3ML) 0.083% nebulizer solution Take 3 mLs (2.5 mg total) by nebulization every 6 (six) hours as needed for wheezing or shortness of breath. 04/19/18   Khatri, Hina, PA-C  azithromycin (ZITHROMAX) 250 MG tablet Take 1 tablet (250 mg total) by mouth daily. Take first 2 tablets together, then 1 every day until finished. 04/08/18   Law, Waylan Boga, PA-C  fluticasone (FLOVENT HFA) 44 MCG/ACT inhaler Inhale 2 puffs into the lungs 2 (two) times daily. For asthma Patient not taking: Reported on 04/08/2018 02/09/18   Armandina Stammer I, NP  gabapentin (NEURONTIN) 400 MG capsule Take 1 capsule (400 mg total) by mouth 3 (three) times daily. For agitation Patient not taking: Reported on 04/08/2018 02/09/18   Armandina Stammer I, NP  hydrOXYzine (ATARAX/VISTARIL) 25 MG tablet Take 1 tablet (25 mg) by mouth three times daily & 2 tablets (50 mg) at bedtime for anxiety/sleep Patient not taking: Reported on 04/08/2018 02/09/18   Armandina Stammer I, NP  mirtazapine (REMERON) 30 MG tablet Take 1 tablet (30 mg total) by mouth at bedtime. For depression Patient not taking:  Reported on 04/08/2018 02/09/18   Armandina Stammer I, NP  OLANZapine (ZYPREXA) 2.5 MG tablet Take 1 tablet (2.5 mg total) by mouth at bedtime. For mood control Patient not taking: Reported on 04/08/2018 02/09/18   Armandina Stammer I, NP  ondansetron (ZOFRAN) 4 MG tablet Take 1 tablet (4 mg total) by mouth every 6 (six) hours. 04/08/18   Law, Waylan Boga, PA-C  pantoprazole (PROTONIX) 40 MG tablet Take 1 tablet (40 mg total) by mouth daily. For acid reflux Patient not taking: Reported on 04/08/2018 02/10/18   Armandina Stammer I, NP  predniSONE (DELTASONE) 20 MG tablet  Take 2 tablets (40 mg total) by mouth daily. 06/12/18   Gwyneth Sprout, MD  promethazine (PHENERGAN) 25 MG tablet Take 1 tablet (25 mg total) by mouth every 6 (six) hours as needed for nausea. 06/04/18   Benjiman Core, MD    Family History Family History  Problem Relation Age of Onset  . Coronary artery disease Father   . Heart attack Father   . Heart disease Father   . Heart failure Mother   . Hyperlipidemia Mother   . Hypertension Mother   . Migraines Mother   . COPD Mother   . Diabetes Maternal Aunt   . Asthma Maternal Grandfather   . Asthma Paternal Grandfather     Social History Social History   Tobacco Use  . Smoking status: Current Every Day Smoker    Packs/day: 0.50    Types: Cigarettes  . Smokeless tobacco: Never Used  Substance Use Topics  . Alcohol use: Yes    Alcohol/week: 0.0 standard drinks  . Drug use: Yes    Types: Marijuana, Cocaine    Comment:                       Allergies   Augmentin [amoxicillin-pot clavulanate] and Other   Review of Systems Review of Systems  Gastrointestinal: Positive for nausea (Resolved) and vomiting (Resolved).  All other systems reviewed and are negative.    Physical Exam Updated Vital Signs BP 121/87 (BP Location: Right Arm)   Pulse 71   Temp 97.8 F (36.6 C) (Oral)   Resp 18   Ht 5\' 10"  (1.778 m)   Wt 86.2 kg   SpO2 95%   BMI 27.26 kg/m   Physical Exam Vitals signs and nursing note reviewed.  Constitutional:      General: He is not in acute distress.    Appearance: He is well-developed.     Comments: Sitting comfortably in the bed in no acute distress  HENT:     Head: Normocephalic and atraumatic.  Eyes:     Conjunctiva/sclera: Conjunctivae normal.     Pupils: Pupils are equal, round, and reactive to light.  Neck:     Musculoskeletal: Normal range of motion and neck supple.  Cardiovascular:     Rate and Rhythm: Normal rate and regular rhythm.     Pulses: Normal pulses.  Pulmonary:      Effort: Pulmonary effort is normal. No respiratory distress.     Breath sounds: Normal breath sounds. No wheezing.  Abdominal:     General: There is no distension.     Palpations: Abdomen is soft. There is no mass.     Tenderness: There is no abdominal tenderness. There is no guarding or rebound.     Comments: No tenderness palpation the abdomen.  Soft without rigidity, guarding, distention.  Negative rebound.  Musculoskeletal: Normal range of motion.  Skin:  General: Skin is warm and dry.     Capillary Refill: Capillary refill takes less than 2 seconds.  Neurological:     Mental Status: He is alert and oriented to person, place, and time.      ED Treatments / Results  Labs (all labs ordered are listed, but only abnormal results are displayed) Labs Reviewed - No data to display  EKG None  Radiology No results found.  Procedures Procedures (including critical care time)  Medications Ordered in ED Medications - No data to display   Initial Impression / Assessment and Plan / ED Course  I have reviewed the triage vital signs and the nursing notes.  Pertinent labs & imaging results that were available during my care of the patient were reviewed by me and considered in my medical decision making (see chart for details).        Pt presenting for evaluation of n/v. sxs resolved. Pt without abd pain or fever. Low suspicion for intra-abd infection, perforation, obstx, or surgical abd. I do not believe labs or imaging would be beneficial today. Pt is agreeable to holding off on labs and imaging. Consider reflux with morning nausea and frequently eating spicy food. Pt given PO, tolerating without difficulty. At this time, pt appears safe for d/c return precautions given. Pt states he understands and agrees to plan.   Final Clinical Impressions(s) / ED Diagnoses   Final diagnoses:  Non-intractable vomiting without nausea, unspecified vomiting type    ED Discharge Orders     None       Alveria Apley, PA-C 06/30/18 1233    [name], MD 07/01/18 1536

## 2018-06-30 NOTE — ED Triage Notes (Signed)
Pt in c/o n/v onset today while at work with x 2 vomiting episodes, denies diarrhea, denies abd, work wanted a note for pt per pt d/t vomiting, A&O x4, reports feeling better on assessment

## 2018-06-30 NOTE — Discharge Instructions (Signed)
If you have continued morning nausea/vomiting, consider taking over the counter medication for heartburn.  After eating, remain upright for at least 60 minutes.  Return to the ER with any new, worsening, or concerning symptoms.

## 2018-07-04 ENCOUNTER — Emergency Department (HOSPITAL_COMMUNITY)
Admission: EM | Admit: 2018-07-04 | Discharge: 2018-07-04 | Disposition: A | Discharge status: Home / Self Care | Payer: Self-pay | Attending: Emergency Medicine | Admitting: Emergency Medicine

## 2018-07-04 ENCOUNTER — Other Ambulatory Visit: Payer: Self-pay

## 2018-07-04 DIAGNOSIS — Z79899 Other long term (current) drug therapy: Secondary | ICD-10-CM | POA: Insufficient documentation

## 2018-07-04 DIAGNOSIS — J45909 Unspecified asthma, uncomplicated: Secondary | ICD-10-CM | POA: Insufficient documentation

## 2018-07-04 DIAGNOSIS — J45901 Unspecified asthma with (acute) exacerbation: Secondary | ICD-10-CM

## 2018-07-04 DIAGNOSIS — F909 Attention-deficit hyperactivity disorder, unspecified type: Secondary | ICD-10-CM | POA: Insufficient documentation

## 2018-07-04 DIAGNOSIS — F17210 Nicotine dependence, cigarettes, uncomplicated: Secondary | ICD-10-CM | POA: Insufficient documentation

## 2018-07-04 MED ORDER — AEROCHAMBER PLUS FLO-VU MISC
1.0000 | Freq: Once | Status: AC
  Administered 2018-07-04: 1
  Filled 2018-07-04: qty 1

## 2018-07-04 MED ORDER — ALBUTEROL SULFATE (2.5 MG/3ML) 0.083% IN NEBU
5.0000 mg | INHALATION_SOLUTION | Freq: Once | RESPIRATORY_TRACT | Status: AC
  Administered 2018-07-04: 5 mg via RESPIRATORY_TRACT
  Filled 2018-07-04: qty 6

## 2018-07-04 MED ORDER — ALBUTEROL SULFATE HFA 108 (90 BASE) MCG/ACT IN AERS
1.0000 | INHALATION_SPRAY | RESPIRATORY_TRACT | Status: DC | PRN
  Administered 2018-07-04: 2 via RESPIRATORY_TRACT
  Filled 2018-07-04: qty 6.7

## 2018-07-04 NOTE — ED Notes (Signed)
Pharmacy sending spacer.

## 2018-07-04 NOTE — ED Provider Notes (Signed)
[facility] EMERGENCY DEPARTMENT Provider Note   CSN: [id number] Arrival date & time: 07/04/18  1114    History   Chief Complaint Chief Complaint  Patient presents with  . Shortness of Breath  . Asthma    HPI Devin [name] is a 38 y.o. male presenting today for asthma exacerbation.  Patient reports that when he arrived to work this morning he inhaled some dust which initiated his asthma exacerbation.  He states that he had wheezing and difficulty breathing that is typical for his asthma exacerbation.  He states that he has lost his albuterol inhaler which is why he presented to the emergency department today.  Prior to my evaluation patient received 1 albuterol nebulizer with complete resolution of his symptoms.  On my evaluation patient states that he is breathing at baseline and denies any further concern.  He denies any recent fever or illness.  He denies chest pain, abdominal pain nausea or vomiting.  Patient denies any recent travel or concern for exposure to novel viruses COVID-19.  Patient is requesting discharge.    HPI  Past Medical History:  Diagnosis Date  . ADHD (attention deficit hyperactivity disorder)   . Asthma   . Bipolar 1 disorder (HCC)   . GERD (gastroesophageal reflux disease)   . Transaminitis 07/17/2015    Patient Active Problem List   Diagnosis Date Noted  . Substance induced mood disorder (HCC) 03/07/2018  . Depression   . 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 Bipolar II disorder (HCC) 01/31/2018  . Hypoxia   . COPD exacerbation (HCC)   . Leukocytosis 11/04/2015  . Transaminitis 07/17/2015  . Tooth ache 07/17/2015  . Asthma with status asthmaticus 10/14/2011  . ADHD (attention deficit hyperactivity disorder) 10/14/2011    Past Surgical History:  Procedure Laterality Date  . TYMPANOPLASTY Left 1997        Home Medications    Prior to Admission medications   Medication  Sig Start Date End Date Taking? Authorizing Provider  acetaminophen (TYLENOL) 500 MG tablet Take 1 tablet (500 mg total) by mouth every 6 (six) hours as needed. 04/08/18   Law, Waylan Boga, PA-C  albuterol (PROVENTIL) (2.5 MG/3ML) 0.083% nebulizer solution Take 3 mLs (2.5 mg total) by nebulization every 6 (six) hours as needed for wheezing or shortness of breath. 04/19/18   Khatri, Hina, PA-C  azithromycin (ZITHROMAX) 250 MG tablet Take 1 tablet (250 mg total) by mouth daily. Take first 2 tablets together, then 1 every day until finished. 04/08/18   Law, Waylan Boga, PA-C  fluticasone (FLOVENT HFA) 44 MCG/ACT inhaler Inhale 2 puffs into the lungs 2 (two) times daily. For asthma Patient not taking: Reported on 04/08/2018 02/09/18   Armandina Stammer I, NP  gabapentin (NEURONTIN) 400 MG capsule Take 1 capsule (400 mg total) by mouth 3 (three) times daily. For agitation Patient not taking: Reported on 04/08/2018 02/09/18   Armandina Stammer I, NP  hydrOXYzine (ATARAX/VISTARIL) 25 MG tablet Take 1 tablet (25 mg) by mouth three times daily & 2 tablets (50 mg) at bedtime for anxiety/sleep Patient not taking: Reported on 04/08/2018 02/09/18   Armandina Stammer I, NP  mirtazapine (REMERON) 30 MG tablet Take 1 tablet (30 mg total) by mouth at bedtime. For depression Patient not taking: Reported on 04/08/2018 02/09/18   Armandina Stammer I, NP  OLANZapine (ZYPREXA) 2.5 MG tablet Take 1 tablet (2.5 mg total) by mouth at bedtime. For mood control Patient not taking: Reported  on 04/08/2018 02/09/18   Armandina StammerNwoko, Agnes I, NP  ondansetron (ZOFRAN) 4 MG tablet Take 1 tablet (4 mg total) by mouth every 6 (six) hours. 04/08/18   Law, Waylan BogaAlexandra M, PA-C  pantoprazole (PROTONIX) 40 MG tablet Take 1 tablet (40 mg total) by mouth daily. For acid reflux Patient not taking: Reported on 04/08/2018 02/10/18   Armandina StammerNwoko, Agnes I, NP  predniSONE (DELTASONE) 20 MG tablet Take 2 tablets (40 mg total) by mouth daily. 06/12/18   Gwyneth SproutPlunkett, Whitney, MD   promethazine (PHENERGAN) 25 MG tablet Take 1 tablet (25 mg total) by mouth every 6 (six) hours as needed for nausea. 06/04/18   Benjiman CorePickering, Nathan, MD    Family History Family History  Problem Relation Age of Onset  . Coronary artery disease Father   . Heart attack Father   . Heart disease Father   . Heart failure Mother   . Hyperlipidemia Mother   . Hypertension Mother   . Migraines Mother   . COPD Mother   . Diabetes Maternal Aunt   . Asthma Maternal Grandfather   . Asthma Paternal Grandfather     Social History Social History   Tobacco Use  . Smoking status: Current Every Day Smoker    Packs/day: 0.50    Types: Cigarettes  . Smokeless tobacco: Never Used  Substance Use Topics  . Alcohol use: Yes    Alcohol/week: 0.0 standard drinks  . Drug use: Yes    Types: Marijuana, Cocaine    Comment:                       Allergies   Augmentin [amoxicillin-pot clavulanate] and Other   Review of Systems Review of Systems  Constitutional: Negative.  Negative for chills, fatigue and fever.  HENT: Negative.  Negative for congestion, rhinorrhea, sore throat, trouble swallowing and voice change.   Respiratory: Positive for wheezing (Resolved). Negative for cough, choking and shortness of breath.   Cardiovascular: Negative.  Negative for chest pain, palpitations and leg swelling.  Gastrointestinal: Negative.  Negative for abdominal pain, nausea and vomiting.  Musculoskeletal: Negative.  Negative for arthralgias and myalgias.  Skin: Negative.  Negative for rash.  Neurological: Negative.  Negative for weakness and headaches.   Physical Exam Updated Vital Signs BP 121/78 (BP Location: Right Arm)   Pulse 78   Temp 98.5 F (36.9 C) (Oral)   Resp 18   Ht 5\' 10"  (1.778 m)   Wt 86.2 kg   SpO2 98%   BMI 27.26 kg/m   Physical Exam Constitutional:      General: He is not in acute distress.    Appearance: Normal appearance. He is well-developed. He is not ill-appearing or  diaphoretic.  HENT:     Head: Normocephalic and atraumatic.     Jaw: There is normal jaw occlusion. No trismus.     Right Ear: Tympanic membrane, ear canal and external ear normal.     Left Ear: Tympanic membrane, ear canal and external ear normal.     Nose: Nose normal.     Right Sinus: No maxillary sinus tenderness or frontal sinus tenderness.     Left Sinus: No maxillary sinus tenderness or frontal sinus tenderness.     Mouth/Throat:     Mouth: Mucous membranes are moist.     Pharynx: Oropharynx is clear. Uvula midline.     Comments: The patient has normal phonation and is in control of secretions. No stridor.  Midline uvula without edema. Soft  palate rises symmetrically. No tonsillar erythema, swelling or exudates. Tongue protrusion is normal, floor of mouth is soft. No trismus. No creptius on neck palpation. No gingival erythema or fluctuance noted. Mucus membranes moist. Eyes:     General: Vision grossly intact. Gaze aligned appropriately.     Conjunctiva/sclera: Conjunctivae normal.     Pupils: Pupils are equal, round, and reactive to light.  Neck:     Musculoskeletal: Full passive range of motion without pain, normal range of motion and neck supple.     Trachea: Trachea and phonation normal. No tracheal tenderness or tracheal deviation.  Cardiovascular:     Rate and Rhythm: Normal rate and regular rhythm.     Heart sounds: Normal heart sounds.  Pulmonary:     Effort: Pulmonary effort is normal. No accessory muscle usage or respiratory distress.     Breath sounds: Normal breath sounds and air entry. No stridor. No decreased breath sounds, wheezing or rhonchi.  Chest:     Chest wall: No tenderness.  Abdominal:     General: There is no distension.     Palpations: Abdomen is soft.     Tenderness: There is no abdominal tenderness. There is no guarding or rebound.  Musculoskeletal: Normal range of motion.     Right lower leg: He exhibits no tenderness.     Left lower leg: He  exhibits no tenderness.     Comments: Moving all extremity spontaneously  Skin:    General: Skin is warm and dry.  Neurological:     Mental Status: He is alert.     GCS: GCS eye subscore is 4. GCS verbal subscore is 5. GCS motor subscore is 6.     Comments: Speech is clear and goal oriented, follows commands Major Cranial nerves without deficit, no facial droop Moves extremities without ataxia, coordination intact Normal gait  Psychiatric:        Behavior: Behavior normal.      ED Treatments / Results  Labs (all labs ordered are listed, but only abnormal results are displayed) Labs Reviewed - No data to display  EKG None  Radiology No results found.  Procedures Procedures (including critical care time)  Medications Ordered in ED Medications  albuterol (PROVENTIL HFA;VENTOLIN HFA) 108 (90 Base) MCG/ACT inhaler 1-2 puff (has no administration in time range)  aerochamber plus with mask device 1 each (has no administration in time range)  albuterol (PROVENTIL) (2.5 MG/3ML) 0.083% nebulizer solution 5 mg (5 mg Nebulization Given 07/04/18 1128)     Initial Impression / Assessment and Plan / ED Course  I have reviewed the triage vital signs and the nursing notes.  Pertinent labs & imaging results that were available during my care of the patient were reviewed by me and considered in my medical decision making (see chart for details).    38 year old male with history of asthma presented today for asthma exacerbation that he describes as his typical exacerbation, he has run out of his home inhaler.  Prior to my evaluation he received 1 albuterol neb with complete resolution of his symptoms.  He states that he is now breathing at baseline and is requesting discharge.  Lungs are clear to auscultation bilaterally.  Patient without any other concerns today.  Patient without history of chest pain, recent fever or illness or recent travel.  I have offered prednisone for the patient to  avoid future asthma exacerbations and he is declined steroids today.  He has been provided with albuterol inhaler as well  as a spacer.  Patient is afebrile, not tachycardic, not hypotensive and with SPO2 of 98% on room air.  At this time there does not appear to be any evidence of an acute emergency medical condition and the patient appears stable for discharge with appropriate outpatient follow up. Diagnosis was discussed with patient who verbalizes understanding of care plan and is agreeable to discharge. I have discussed return precautions with patient who verbalizes understanding of return precautions. Patient encouraged to follow-up with their PCP. All questions answered.  Patient has been discharged in good condition.  Note: Portions of this report may have been transcribed using voice recognition software. Every effort was made to ensure accuracy; however, inadvertent computerized transcription errors may still be present. Final Clinical Impressions(s) / ED Diagnoses   Final diagnoses:  Exacerbation of asthma, unspecified asthma severity, unspecified whether persistent    ED Discharge Orders    None       Elizabeth Palau 07/04/18 1223    Arby Barrette, MD 07/04/18 1311

## 2018-07-04 NOTE — ED Notes (Signed)
Patient verbalizes understanding of discharge instructions . Opportunity for questions and answers were provided . Armband removed by staff ,Pt discharged from ED. W/C  offered at D/C  and Declined W/C at D/C and was escorted to lobby by RN.  

## 2018-07-04 NOTE — ED Triage Notes (Signed)
Pt. Stated, My asthma is acting up, started this morning. Devin [name] been out of my inhaler.

## 2018-07-04 NOTE — Discharge Instructions (Signed)
You have been diagnosed today with Asthma Exacerbation.  At this time there does not appear to be the presence of an emergent medical condition, however there is always the potential for conditions to change. Please read and follow the below instructions.  Please return to the Emergency Department immediately for any new or worsening symptoms. Please be sure to follow up with your Primary Care Provider within one week regarding your visit today; please call their office to schedule an appointment even if you are feeling better for a follow-up visit. You may use the inhaler and spacer given you today for your asthma symptoms.  If you are using his medication too often or feel that it is not working please return to emergency department for further evaluation and treatment.  Get help right away if: You seem to be worse and are not responding to medicine during an asthma attack. You are short of breath even at rest. You get short of breath when doing very little activity. You have trouble eating, drinking, or talking. You have chest pain or tightness. You have a fast heartbeat. Your lips or fingernails start to turn blue. You are light-headed or dizzy, or you faint. Your peak flow is less than 50% of your personal best. You feel too tired to breathe normally. Any new or concerning symptoms  Please read the additional information packets attached to your discharge summary.

## 2018-07-14 ENCOUNTER — Encounter (HOSPITAL_COMMUNITY): Payer: Self-pay | Admitting: Emergency Medicine

## 2018-07-14 ENCOUNTER — Emergency Department (HOSPITAL_COMMUNITY)
Admission: EM | Admit: 2018-07-14 | Discharge: 2018-07-14 | Disposition: A | Discharge status: Home / Self Care | Payer: Self-pay | Attending: Emergency Medicine | Admitting: Emergency Medicine

## 2018-07-14 ENCOUNTER — Other Ambulatory Visit: Payer: Self-pay

## 2018-07-14 DIAGNOSIS — J4520 Mild intermittent asthma, uncomplicated: Secondary | ICD-10-CM | POA: Insufficient documentation

## 2018-07-14 DIAGNOSIS — Z79899 Other long term (current) drug therapy: Secondary | ICD-10-CM | POA: Insufficient documentation

## 2018-07-14 DIAGNOSIS — Z7689 Persons encountering health services in other specified circumstances: Secondary | ICD-10-CM

## 2018-07-14 DIAGNOSIS — F17210 Nicotine dependence, cigarettes, uncomplicated: Secondary | ICD-10-CM | POA: Insufficient documentation

## 2018-07-14 DIAGNOSIS — J449 Chronic obstructive pulmonary disease, unspecified: Secondary | ICD-10-CM | POA: Insufficient documentation

## 2018-07-14 NOTE — ED Provider Notes (Signed)
[facility] EMERGENCY DEPARTMENT Provider Note   CSN: [id number] Arrival date & time: 07/14/18  4270  History   Chief Complaint Chief Complaint  Patient presents with  . Asthma    HPI Devin [name] is a 38 y.o. male with past medical history significant for depression, bipolar disorder, substance abuse, asthma who presents for evaluation of "asthma."  States he woke up this morning and felt short of breath.  He used his albuterol inhaler and proceeded to work. Patient states that he told his employer about this incident at home and his employer told him he needed to be evaluated in the emergency department to r/o coronavirus.  Patient states he felt like he has had to use his albuterol inhaler over the last month "more than normal."  Patient states he is using his rescue inhaler approximately every 2-3 days.  Denies nighttime symptoms.  No fever, chills, nausea, vomiting, chest pain, shortness of breath, cough, abdominal pain, diarrhea dysuria.  Patient states "I am just here because I need a work note."  On triage note states patient requesting nebulizer treatment, however when I presented to the room initial evaluation patient states "I just need a note I don't want anything else."  Denies additional aggravating or alleviating factors.  No recent upper respiratory symptoms.  Patient states it is been 3-4 years since he was admitted to the hospital.  Denies history of ICU administration or intubation. Last steroid use > 4 months ago.  History obtained from patient.  No interpreter was used.     HPI  Past Medical History:  Diagnosis Date  . ADHD (attention deficit hyperactivity disorder)   . Asthma   . Bipolar 1 disorder (HCC)   . GERD (gastroesophageal reflux disease)   . Transaminitis 07/17/2015    Patient Active Problem List   Diagnosis Date Noted  . Substance induced mood disorder (HCC) 03/07/2018  . Depression   . MDD (major depressive disorder), recurrent  severe, without psychosis (HCC) 01/31/2018  . Bipolar II disorder (HCC) 01/31/2018  . Hypoxia   . COPD exacerbation (HCC)   . Leukocytosis 11/04/2015  . Transaminitis 07/17/2015  . Tooth ache 07/17/2015  . Asthma with status asthmaticus 10/14/2011  . ADHD (attention deficit hyperactivity disorder) 10/14/2011    Past Surgical History:  Procedure Laterality Date  . TYMPANOPLASTY Left 1997      Home Medications    Prior to Admission medications   Medication Sig Start Date End Date Taking? Authorizing Provider  acetaminophen (TYLENOL) 500 MG tablet Take 1 tablet (500 mg total) by mouth every 6 (six) hours as needed. 04/08/18   Law, Waylan Boga, PA-C  albuterol (PROVENTIL) (2.5 MG/3ML) 0.083% nebulizer solution Take 3 mLs (2.5 mg total) by nebulization every 6 (six) hours as needed for wheezing or shortness of breath. 04/19/18   Khatri, Hina, PA-C  azithromycin (ZITHROMAX) 250 MG tablet Take 1 tablet (250 mg total) by mouth daily. Take first 2 tablets together, then 1 every day until finished. 04/08/18   Law, Waylan Boga, PA-C  fluticasone (FLOVENT HFA) 44 MCG/ACT inhaler Inhale 2 puffs into the lungs 2 (two) times daily. For asthma Patient not taking: Reported on 04/08/2018 02/09/18   Armandina Stammer I, NP  gabapentin (NEURONTIN) 400 MG capsule Take 1 capsule (400 mg total) by mouth 3 (three) times daily. For agitation Patient not taking: Reported on 04/08/2018 02/09/18   Armandina Stammer I, NP  hydrOXYzine (ATARAX/VISTARIL) 25 MG tablet Take 1 tablet (25 mg) by mouth  three times daily & 2 tablets (50 mg) at bedtime for anxiety/sleep Patient not taking: Reported on 04/08/2018 02/09/18   Armandina Stammer I, NP  mirtazapine (REMERON) 30 MG tablet Take 1 tablet (30 mg total) by mouth at bedtime. For depression Patient not taking: Reported on 04/08/2018 02/09/18   Armandina Stammer I, NP  OLANZapine (ZYPREXA) 2.5 MG tablet Take 1 tablet (2.5 mg total) by mouth at bedtime. For mood control Patient not  taking: Reported on 04/08/2018 02/09/18   Armandina Stammer I, NP  ondansetron (ZOFRAN) 4 MG tablet Take 1 tablet (4 mg total) by mouth every 6 (six) hours. 04/08/18   Law, Waylan Boga, PA-C  pantoprazole (PROTONIX) 40 MG tablet Take 1 tablet (40 mg total) by mouth daily. For acid reflux Patient not taking: Reported on 04/08/2018 02/10/18   Armandina Stammer I, NP  predniSONE (DELTASONE) 20 MG tablet Take 2 tablets (40 mg total) by mouth daily. 06/12/18   Gwyneth Sprout, MD  promethazine (PHENERGAN) 25 MG tablet Take 1 tablet (25 mg total) by mouth every 6 (six) hours as needed for nausea. 06/04/18   Benjiman Core, MD    Family History Family History  Problem Relation Age of Onset  . Coronary artery disease Father   . Heart attack Father   . Heart disease Father   . Heart failure Mother   . Hyperlipidemia Mother   . Hypertension Mother   . Migraines Mother   . COPD Mother   . Diabetes Maternal Aunt   . Asthma Maternal Grandfather   . Asthma Paternal Grandfather     Social History Social History   Tobacco Use  . Smoking status: Current Every Day Smoker    Packs/day: 0.50    Types: Cigarettes  . Smokeless tobacco: Never Used  Substance Use Topics  . Alcohol use: Yes    Alcohol/week: 0.0 standard drinks  . Drug use: Yes    Types: Marijuana, Cocaine    Comment:                       Allergies   Augmentin [amoxicillin-pot clavulanate] and Other   Review of Systems Review of Systems  Constitutional: Negative.   HENT: Negative.   Respiratory: Positive for shortness of breath. Negative for apnea, cough, choking, chest tightness, wheezing and stridor.   Cardiovascular: Negative.   Gastrointestinal: Negative.   Genitourinary: Negative.   Musculoskeletal: Negative.   Skin: Negative.   Neurological: Negative.   All other systems reviewed and are negative.    Physical Exam Updated Vital Signs BP 136/89 (BP Location: Right Arm)   Pulse 87   Temp 97.8 F (36.6 C) (Oral)    Resp 18   Ht  (1.753 m)   Wt 88.5 kg   SpO2 97%   BMI 28.80 kg/m   Physical Exam Vitals signs and nursing note reviewed.  Constitutional:      General: He is not in acute distress.    Appearance: He is not ill-appearing, toxic-appearing or diaphoretic.  HENT:     Head: Normocephalic and atraumatic.     Jaw: There is normal jaw occlusion.     Right Ear: Tympanic membrane, ear canal and external ear normal. There is no impacted cerumen. No hemotympanum. Tympanic membrane is not injected, scarred, perforated, erythematous, retracted or bulging.     Left Ear: Tympanic membrane, ear canal and external ear normal. There is no impacted cerumen. No hemotympanum. Tympanic membrane is not injected, scarred, perforated, erythematous,  retracted or bulging.     Ears:     Comments: No Mastoid tenderness.    Nose:     Comments: Clear rhinorrhea and congestion to bilateral nares.  No sinus tenderness.    Mouth/Throat:     Comments: Posterior oropharynx clear.  Mucous membranes moist.  Tonsils without erythema or exudate.  Uvula midline without deviation.  No evidence of PTA or RPA.  No drooling, dysphasia or trismus.  Phonation normal. Neck:     Trachea: Trachea and phonation normal.     Meningeal: Brudzinski's sign and Kernig's sign absent.     Comments: No Neck stiffness or neck rigidity.  No meningismus.  No cervical lymphadenopathy. Cardiovascular:     Comments: No murmurs rubs or gallops. Pulmonary:     Comments: Clear to auscultation bilaterally without wheeze, rhonchi or rales.  No accessory muscle usage.  Able speak in full sentences. Abdominal:     Comments: Soft, nontender without rebound or guarding.  No CVA tenderness.  Musculoskeletal:     Comments: Moves all 4 extremities without difficulty.  Lower extremities without edema, erythema or warmth.  Skin:    Comments: Brisk capillary refill.  No rashes or lesions.  Neurological:     Mental Status: He is alert.     Comments:  Ambulatory in department without difficulty.  Cranial nerves II through XII grossly intact.  No facial droop.  No aphasia.      ED Treatments / Results  Labs (all labs ordered are listed, but only abnormal results are displayed) Labs Reviewed - No data to display  EKG None  Radiology No results found.  Procedures Procedures (including critical care time)  Medications Ordered in ED Medications - No data to display   Initial Impression / Assessment and Plan / ED Course  I have reviewed the triage vital signs and the nursing notes.  Pertinent labs & imaging results that were available during my care of the patient were reviewed by me and considered in my medical decision making (see chart for details).  38 year old male appears otherwise well presents for evaluation of shortness of breath.  Afebrile, nonseptic, non-ill-appearing.  Patient states he had shortness of breath this morning which was relieved with his albuterol inhaler.  Lungs clear to auscultation bilaterally without wheeze, rhonchi or rales.  No tachypnea, tachycardia or hypoxia.  He is able speak in full sentences without difficulty.  No evidence of respiratory distress.  I reviewed triage note states patient requesting nebulizer treatment, however on my initial evaluation patient states "I do not want anything but a work note."  No upper respiratory symptoms.  Patient able to ambulate in department oxygen saturation greater than 95%. Pt states they are breathing at baseline. Pt has been instructed to continue using prescribed medications and to speak with PCP about today's exacerbation.  Patient does not have any chest pain, lower extremity swelling, hemoptysis, risk factors for PE.  Doubt ACS, PE or dissection as cause for patient's earlier shortness of breath.  He states he does not want any treatment or testing at this time is just requesting a work note as his employer stated since he was short of breath earlier today that  he needs to be evaluated for coronavirus.  Patient is not meet CDC criteria for coronavirus testing at this time he has been afebrile and without cough.  He has no known exposure to coronavirus at this time.  Patient hemodynamically stable and appropriate for DC home at this time.  I discussed strict return precautions with patient. Patient voiced understanding and is agreeable for follow-up.     Final Clinical Impressions(s) / ED Diagnoses   Final diagnoses:  Mild intermittent asthma, unspecified whether complicated  Return to work evaluation    ED Discharge Orders    None       Linwood Dibbles, PA-C 07/14/18 1024    Eber Hong, MD 07/19/18 [phone]

## 2018-07-14 NOTE — Discharge Instructions (Signed)
Evaluated today for your asthma.  Your lungs are clear.  Please continue to use your albuterol inhaler as needed.  I have written you a return to work note.  Return to the emergency department for evaluation.

## 2018-07-14 NOTE — ED Triage Notes (Addendum)
Pt in from home with c/o "asthma acting up". States he's been taking his Albuterol inhaler, but feels he needs a neb trx. Denies cough or fevers

## 2018-07-14 NOTE — ED Notes (Signed)
Patient verbalizes understanding of discharge instructions. Opportunity for questioning and answers were provided. Armband removed by staff, pt discharged from ED.  

## 2018-07-16 ENCOUNTER — Emergency Department (HOSPITAL_COMMUNITY)
Admission: EM | Admit: 2018-07-16 | Discharge: 2018-07-16 | Disposition: A | Discharge status: Home / Self Care | Payer: Self-pay | Attending: Emergency Medicine | Admitting: Emergency Medicine

## 2018-07-16 ENCOUNTER — Emergency Department (HOSPITAL_COMMUNITY): Payer: Self-pay

## 2018-07-16 ENCOUNTER — Other Ambulatory Visit: Payer: Self-pay

## 2018-07-16 DIAGNOSIS — B9789 Other viral agents as the cause of diseases classified elsewhere: Secondary | ICD-10-CM | POA: Insufficient documentation

## 2018-07-16 DIAGNOSIS — J069 Acute upper respiratory infection, unspecified: Secondary | ICD-10-CM | POA: Insufficient documentation

## 2018-07-16 DIAGNOSIS — F17210 Nicotine dependence, cigarettes, uncomplicated: Secondary | ICD-10-CM | POA: Insufficient documentation

## 2018-07-16 DIAGNOSIS — J4541 Moderate persistent asthma with (acute) exacerbation: Secondary | ICD-10-CM | POA: Insufficient documentation

## 2018-07-16 DIAGNOSIS — J449 Chronic obstructive pulmonary disease, unspecified: Secondary | ICD-10-CM | POA: Insufficient documentation

## 2018-07-16 LAB — COMPREHENSIVE METABOLIC PANEL
ALBUMIN: 4.1 g/dL (ref 3.5–5.0)
ALT: 25 U/L (ref 0–44)
AST: 30 U/L (ref 15–41)
Alkaline Phosphatase: 54 U/L (ref 38–126)
Anion gap: 15 (ref 5–15)
BUN: 9 mg/dL (ref 6–20)
CHLORIDE: 108 mmol/L (ref 98–111)
CO2: 17 mmol/L — ABNORMAL LOW (ref 22–32)
CREATININE: 1.02 mg/dL (ref 0.61–1.24)
Calcium: 8.7 mg/dL — ABNORMAL LOW (ref 8.9–10.3)
GFR calc Af Amer: >60 mL/min (ref >60)
Glucose, Bld: 82 mg/dL (ref 70–99)
Potassium: 3.5 mmol/L (ref 3.5–5.1)
Sodium: 140 mmol/L (ref 135–145)
Total Bilirubin: 0.8 mg/dL (ref 0.3–1.2)
Total Protein: 7.1 g/dL (ref 6.5–8.1)

## 2018-07-16 LAB — CBC WITH DIFFERENTIAL/PLATELET
Abs Immature Granulocytes: 0.02 10*3/uL (ref 0.00–0.07)
Basophils Absolute: 0 10*3/uL (ref 0.0–0.1)
Basophils Relative: 0 %
Eosinophils Absolute: 0.4 10*3/uL (ref 0.0–0.5)
Eosinophils Relative: 5 %
HCT: 46.1 % (ref 39.0–52.0)
HEMOGLOBIN: 15.8 g/dL (ref 13.0–17.0)
Immature Granulocytes: 0 %
LYMPHS ABS: 3 10*3/uL (ref 0.7–4.0)
Lymphocytes Relative: 35 %
MCH: 30.4 pg (ref 26.0–34.0)
MCHC: 34.3 g/dL (ref 30.0–36.0)
MCV: 88.7 fL (ref 80.0–100.0)
Monocytes Absolute: 0.7 10*3/uL (ref 0.1–1.0)
Monocytes Relative: 8 %
Neutro Abs: 4.6 10*3/uL (ref 1.7–7.7)
Neutrophils Relative %: 52 %
Platelets: 342 10*3/uL (ref 150–400)
RBC: 5.2 MIL/uL (ref 4.22–5.81)
RDW: 13.4 % (ref 11.5–15.5)
WBC: 8.7 10*3/uL (ref 4.0–10.5)
nRBC: 0 % (ref 0.0–0.2)

## 2018-07-16 LAB — LACTIC ACID, PLASMA
LACTIC ACID, VENOUS: 2.7 mmol/L — AB (ref 0.5–1.9)
Lactic Acid, Venous: 3 mmol/L (ref 0.5–1.9)

## 2018-07-16 LAB — D-DIMER, QUANTITATIVE: D-Dimer, Quant: <0.27 ug/mL-FEU (ref 0.00–0.50)

## 2018-07-16 MED ORDER — PREDNISONE 20 MG PO TABS: 40.0000 mg | ORAL_TABLET | Freq: Once | ORAL | Status: DC

## 2018-07-16 MED ORDER — ACETAMINOPHEN 325 MG PO TABS
650.0000 mg | ORAL_TABLET | Freq: Once | ORAL | Status: AC
  Administered 2018-07-16: 650 mg via ORAL
  Filled 2018-07-16: qty 2

## 2018-07-16 MED ORDER — BENZONATATE 100 MG PO CAPS: 100.0000 mg | ORAL_CAPSULE | Freq: Three times a day (TID) | ORAL | 0 refills | Status: DC | PRN

## 2018-07-16 MED ORDER — PREDNISONE 10 MG PO TABS: 40.0000 mg | ORAL_TABLET | Freq: Every day | ORAL | 0 refills | Status: DC

## 2018-07-16 MED ORDER — ALBUTEROL SULFATE HFA 108 (90 BASE) MCG/ACT IN AERS
4.0000 | INHALATION_SPRAY | Freq: Once | RESPIRATORY_TRACT | Status: AC
  Administered 2018-07-16: 4 via RESPIRATORY_TRACT

## 2018-07-16 MED ORDER — ALBUTEROL SULFATE HFA 108 (90 BASE) MCG/ACT IN AERS
4.0000 | INHALATION_SPRAY | Freq: Once | RESPIRATORY_TRACT | Status: AC
  Administered 2018-07-16: 4 via RESPIRATORY_TRACT
  Filled 2018-07-16: qty 6.7

## 2018-07-16 MED ORDER — ALBUTEROL SULFATE (2.5 MG/3ML) 0.083% IN NEBU: 2.5000 mg | INHALATION_SOLUTION | Freq: Four times a day (QID) | RESPIRATORY_TRACT | 0 refills | Status: DC | PRN

## 2018-07-16 MED ORDER — LACTATED RINGERS IV BOLUS
1000.0000 mL | Freq: Once | INTRAVENOUS | Status: AC
  Administered 2018-07-16: 1000 mL via INTRAVENOUS

## 2018-07-16 MED ORDER — SODIUM CHLORIDE 0.9 % IV BOLUS
1000.0000 mL | Freq: Once | INTRAVENOUS | Status: AC
  Administered 2018-07-16: 1000 mL via INTRAVENOUS

## 2018-07-16 NOTE — ED Provider Notes (Signed)
[facility] EMERGENCY DEPARTMENT Provider Note   CSN: [id number] Arrival date & time: 07/16/18  0913    History   Chief Complaint Chief Complaint  Patient presents with  . Shortness of Breath  . Cough    HPI Devin [name] is a 38 y.o. male.     The history is provided by the patient and medical records. No language interpreter was used.  Shortness of Breath  Associated symptoms: cough   Cough  Associated symptoms: shortness of breath    Devin [name] is a 38 y.o. male who presents to the Emergency Department complaining of sob/cough. He presents to the emergency department for evaluation of shortness of breath and cough. He has chronic shortness of breath but began to feel worse about a week ago. He has associated runny nose. Cough is dry and nonproductive. He has body aches. No reports of fevers. No known sick contacts but he does work in [employer]. He has tried his inhaler at home with no significant change in symptoms. Symptoms are moderate, constant, worsening. Past Medical History:  Diagnosis Date  . ADHD (attention deficit hyperactivity disorder)   . Asthma   . Bipolar 1 disorder (HCC)   . GERD (gastroesophageal reflux disease)   . Transaminitis 07/17/2015    Patient Active Problem List   Diagnosis Date Noted  . Substance induced mood disorder (HCC) 03/07/2018  . Depression   . 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 Bipolar II disorder (HCC) 01/31/2018  . Hypoxia   . COPD exacerbation (HCC)   . Leukocytosis 11/04/2015  . Transaminitis 07/17/2015  . Tooth ache 07/17/2015  . Asthma with status asthmaticus 10/14/2011  . ADHD (attention deficit hyperactivity disorder) 10/14/2011    Past Surgical History:  Procedure Laterality Date  . TYMPANOPLASTY Left 1997        Home Medications    Prior to Admission medications   Medication Sig Start Date End Date Taking? Authorizing Provider   acetaminophen (TYLENOL) 500 MG tablet Take 1 tablet (500 mg total) by mouth every 6 (six) hours as needed. Patient not taking: Reported on 07/14/2018 04/08/18   Emi Holes, PA-C  albuterol (PROVENTIL HFA;VENTOLIN HFA) 108 (90 Base) MCG/ACT inhaler Inhale 2 puffs into the lungs every 6 (six) hours as needed for wheezing or shortness of breath.    [provider]  albuterol (PROVENTIL) (2.5 MG/3ML) 0.083% nebulizer solution Take 3 mLs (2.5 mg total) by nebulization every 6 (six) hours as needed for wheezing or shortness of breath. 07/16/18   Tilden Fossa, MD  benzonatate (TESSALON) 100 MG capsule Take 1 capsule (100 mg total) by mouth 3 (three) times daily as needed for cough. 07/16/18   Tilden Fossa, MD  fluticasone (FLOVENT HFA) 44 MCG/ACT inhaler Inhale 2 puffs into the lungs 2 (two) times daily. For asthma Patient not taking: Reported on 04/08/2018 02/09/18   Armandina Stammer I, NP  gabapentin (NEURONTIN) 400 MG capsule Take 1 capsule (400 mg total) by mouth 3 (three) times daily. For agitation Patient not taking: Reported on 04/08/2018 02/09/18   Armandina Stammer I, NP  hydrOXYzine (ATARAX/VISTARIL) 25 MG tablet Take 1 tablet (25 mg) by mouth three times daily & 2 tablets (50 mg) at bedtime for anxiety/sleep Patient not taking: Reported on 04/08/2018 02/09/18   Armandina Stammer I, NP  mirtazapine (REMERON) 30 MG tablet Take 1 tablet (30 mg total) by mouth at bedtime. For depression Patient not taking: Reported on 04/08/2018 02/09/18  Armandina Stammer I, NP  OLANZapine (ZYPREXA) 2.5 MG tablet Take 1 tablet (2.5 mg total) by mouth at bedtime. For mood control Patient not taking: Reported on 04/08/2018 02/09/18   Armandina Stammer I, NP  ondansetron (ZOFRAN) 4 MG tablet Take 1 tablet (4 mg total) by mouth every 6 (six) hours. Patient not taking: Reported on 07/14/2018 04/08/18   Emi Holes, PA-C  pantoprazole (PROTONIX) 40 MG tablet Take 1 tablet (40 mg total) by mouth daily. For acid reflux  Patient not taking: Reported on 04/08/2018 02/10/18   Armandina Stammer I, NP  predniSONE (DELTASONE) 10 MG tablet Take 4 tablets (40 mg total) by mouth daily. 07/16/18   Tilden Fossa, MD  promethazine (PHENERGAN) 25 MG tablet Take 1 tablet (25 mg total) by mouth every 6 (six) hours as needed for nausea. Patient not taking: Reported on 07/14/2018 06/04/18   Benjiman Core, MD    Family History Family History  Problem Relation Age of Onset  . Coronary artery disease Father   . Heart attack Father   . Heart disease Father   . Heart failure Mother   . Hyperlipidemia Mother   . Hypertension Mother   . Migraines Mother   . COPD Mother   . Diabetes Maternal Aunt   . Asthma Maternal Grandfather   . Asthma Paternal Grandfather     Social History Social History   Tobacco Use  . Smoking status: Current Every Day Smoker    Packs/day: 0.50    Types: Cigarettes  . Smokeless tobacco: Never Used  Substance Use Topics  . Alcohol use: Yes    Alcohol/week: 0.0 standard drinks  . Drug use: Yes    Types: Marijuana, Cocaine    Comment:                       Allergies   Augmentin [amoxicillin-pot clavulanate] and Other   Review of Systems Review of Systems  Respiratory: Positive for cough and shortness of breath.   All other systems reviewed and are negative.    Physical Exam Updated Vital Signs BP 124/81   Pulse 84   Temp 98.5 F (36.9 C) (Oral)   Resp 17   Ht 5\' 9"  (1.753 m)   Wt 88 kg   SpO2 98%   BMI 28.65 kg/m   Physical Exam Vitals signs and nursing note reviewed.  Constitutional:      Appearance: He is well-developed.  HENT:     Head: Normocephalic and atraumatic.  Cardiovascular:     Rate and Rhythm: Normal rate and regular rhythm.     Heart sounds: No murmur.  Pulmonary:     Effort: Pulmonary effort is normal. No respiratory distress.     Comments: Diffuse wheezing Abdominal:     Palpations: Abdomen is soft.     Tenderness: There is no abdominal  tenderness. There is no guarding or rebound.  Musculoskeletal:        General: No swelling or tenderness.  Skin:    General: Skin is warm and dry.  Neurological:     Mental Status: He is alert and oriented to person, place, and time.  Psychiatric:        Behavior: Behavior normal.      ED Treatments / Results  Labs (all labs ordered are listed, but only abnormal results are displayed) Labs Reviewed  COMPREHENSIVE METABOLIC PANEL - Abnormal; Notable for the following components:      Result Value   CO2 17 (*)  Calcium 8.7 (*)    All other components within normal limits  LACTIC ACID, PLASMA - Abnormal; Notable for the following components:   Lactic Acid, Venous 3.0 (*)    All other components within normal limits  LACTIC ACID, PLASMA - Abnormal; Notable for the following components:   Lactic Acid, Venous 2.7 (*)    All other components within normal limits  CBC WITH DIFFERENTIAL/PLATELET  D-DIMER, QUANTITATIVE (NOT AT [facility])    EKG EKG Interpretation  Date/Time:  Thursday July 16 2018 09:29:23 EDT Ventricular Rate:  82 PR Interval:    QRS Duration: 94 QT Interval:  367 QTC Calculation: 429 R Axis:   43 Text Interpretation:  Sinus rhythm ST elev, probable normal early repol pattern No significant change since last tracing Confirmed by Tilden Fossa [phone]) on 07/16/2018 9:39:46 AM   Radiology Dg Chest Port 1 View  Result Date: 07/16/2018 CLINICAL DATA:  Shortness of Breath EXAM: PORTABLE CHEST 1 VIEW COMPARISON:  April 19, 2018 FINDINGS: No edema or consolidation. Heart size and pulmonary vascularity are normal. No adenopathy. No bone lesions. IMPRESSION: No edema or consolidation. Electronically Signed   By: Bretta Bang III M.D.   On: 07/16/2018 10:33    Procedures Procedures (including critical care time)  Medications Ordered in ED Medications  sodium chloride 0.9 % bolus 1,000 mL (0 mLs Intravenous Stopped 07/16/18 1051)  albuterol (PROVENTIL  HFA;VENTOLIN HFA) 108 (90 Base) MCG/ACT inhaler 4 puff (4 puffs Inhalation Given 07/16/18 1036)  lactated ringers bolus 1,000 mL (0 mLs Intravenous Stopped 07/16/18 1324)  acetaminophen (TYLENOL) tablet 650 mg (650 mg Oral Given 07/16/18 1323)  albuterol (PROVENTIL HFA;VENTOLIN HFA) 108 (90 Base) MCG/ACT inhaler 4 puff (4 puffs Inhalation Given 07/16/18 1444)     Initial Impression / Assessment and Plan / ED Course  I have reviewed the triage vital signs and the nursing notes.  Pertinent labs & imaging results that were available during my care of the patient were reviewed by me and considered in my medical decision making (see chart for details).        Patient with history of asthma here with increased congestion and shortness of breath. He does have wheezing on examination without respiratory distress. Initial lactate is mildly elevated, likely secondary to albuterol use. He is not septic on evaluation. No evidence of pneumonia. Presentation is not consistent with PE. He has no hypoxia in the emergency department. Discussed with patient home care for asthma exacerbation and possible viral illness. Discussed with patient that we cannot rule out COVID at this time as he does not meet COVID testing criteria. Discussed with patient isolation at home as well as close outpatient follow-up and return precautions.  Final Clinical Impressions(s) / ED Diagnoses   Final diagnoses:  Moderate persistent asthma with exacerbation  Viral URI with cough    ED Discharge Orders         Ordered    albuterol (PROVENTIL) (2.5 MG/3ML) 0.083% nebulizer solution  Every 6 hours PRN     07/16/18 1528    benzonatate (TESSALON) 100 MG capsule  3 times daily PRN     07/16/18 1528    predniSONE (DELTASONE) 10 MG tablet  Daily     07/16/18 1528           Tilden Fossa, MD 07/16/18 [phone]

## 2018-07-16 NOTE — Discharge Instructions (Addendum)
° °Individuals who are confirmed to have, or are being evaluated for, COVID-19 should follow the prevention steps below °until a healthcare provider or local or state health department says they can return to normal activities. ° °Stay home except to get medical care °You should restrict activities outside your home, except for getting medical care. Do not go to work, school, or public °areas, and do not use public transportation or taxis. ° °Call ahead before visiting your doctor °Before your medical appointment, call the healthcare provider and tell them that you have, or are being evaluated for, °COVID-19 infection. This will help the healthcare provider’s office take steps to keep other people from getting infected. °Ask your healthcare provider to call the local or state health department. ° °Monitor your symptoms °Seek prompt medical attention if your illness is worsening (e.g., difficulty breathing). Before going to your medical °appointment, call the healthcare provider and tell them that you have, or are being evaluated for, COVID-19 infection. Ask °your healthcare provider to call the local or state health department. ° °Wear a facemask °You should wear a facemask that covers your nose and mouth when you are in the same room with other people and °when you visit a healthcare provider. People who live with or visit you should also wear a facemask while they are in the °same room with you. ° °Separate yourself from other people in your home °As much as possible, you should stay in a different room from other people in your home. Also, you should use a separate °bathroom, if available. ° °Avoid sharing household items °You should not share dishes, drinking glasses, cups, eating utensils, towels, bedding, or other items with other people in °your home. After using these items, you should wash them thoroughly with soap and water. ° °Cover your coughs and sneezes °Cover your mouth and nose with a tissue when  you cough or sneeze, or you can cough or sneeze into your sleeve. Throw °used tissues in a lined trash can, and immediately wash your hands with soap and water for at least 20 seconds or use an °alcohol-based hand rub. ° °Wash your hands °Wash your hands often and thoroughly with soap and water for at least 20 seconds. You can use an alcohol-based hand °sanitizer if soap and water are not available and if your hands are not visibly dirty. Avoid touching your eyes, nose, and °mouth with unwashed hands. ° ° °Prevention Steps for Caregivers and Household Members of °Individuals Confirmed to have, or Being Evaluated for, COVID-19 Infection Being Cared for in the Home ° °If you live with, or provide care at home for, a person confirmed to have, or being evaluated for, COVID-19 infection °please follow these guidelines to prevent infection: ° °Follow healthcare provider’s instructions °Make sure that you understand and can help the patient follow any healthcare provider instructions for all care. ° °Provide for the patient’s basic needs °You should help the patient with basic needs in the home and provide support for getting groceries, prescriptions, and °other personal needs. ° °Monitor the patient’s symptoms °If they are getting sicker, call his or her medical provider and tell them that the patient has, or is being evaluated for, °COVID-19 infection. This will help the healthcare provider’s office take steps to keep other people from getting infected. °Ask the healthcare provider to call the local or state health department. ° °Limit the number of people who have contact with the patient °If possible, have only one caregiver for the   patient. °Other household members should stay in another home or place of residence. If this is not possible, they should stay °in another room, or be separated from the patient as much as possible. Use a separate bathroom, if available. °Restrict visitors who do not have an essential need  to be in the home. ° °Keep older adults, very young children, and other sick people away from the patient °Keep older adults, very young children, and those who have compromised immune systems or chronic health conditions away from the patient. This includes people with chronic heart, lung, or kidney conditions, diabetes, and cancer. ° °Ensure good ventilation °Make sure that shared spaces in the home have good air flow, such as from an air conditioner or an opened window, °weather permitting. ° °Wash your hands often °Wash your hands often and thoroughly with soap and water for at least 20 seconds. You can use an alcohol based hand sanitizer if soap and water are not available and if your hands are not visibly dirty. °Avoid touching your eyes, nose, and mouth with unwashed hands. °Use disposable paper towels to dry your hands. If not available, use dedicated cloth towels and replace them when they become wet. ° °Wear a facemask and gloves °Wear a disposable facemask at all times in the room and gloves when you touch or have contact with the patient’s blood, body fluids, and/or secretions or excretions, such as sweat, saliva, sputum, nasal mucus, vomit, urine, or feces.  Ensure the mask fits over your nose and mouth tightly, and do not touch it during use. °Throw out disposable facemasks and gloves after using them. Do not reuse. °Wash your hands immediately after removing your facemask and gloves. °If your personal clothing becomes contaminated, carefully remove clothing and launder. Wash your hands after handling contaminated clothing. °Place all used disposable facemasks, gloves, and other waste in a lined container before disposing them with other household waste. °Remove gloves and wash your hands immediately after handling these items. ° °Do not share dishes, glasses, or other household items with the patient °Avoid sharing household items. You should not share dishes, drinking glasses, cups, eating utensils,  towels, bedding, or other items with a patient who is confirmed to have, or being evaluated for, COVID-19 infection. °After the person uses these items, you should wash them thoroughly with soap and water. ° °Wash laundry thoroughly °Immediately remove and wash clothes or bedding that have blood, body fluids, and/or secretions or excretions, such as sweat, saliva, sputum, nasal mucus, vomit, urine, or feces, on them. °Wear gloves when handling laundry from the patient. °Read and follow directions on labels of laundry or clothing items and detergent. In general, wash and dry with the warmest temperatures recommended on the label. ° °Clean all areas the individual has used often °Clean all touchable surfaces, such as counters, tabletops, doorknobs, bathroom fixtures, toilets, phones, keyboards, tablets, and bedside tables, every day. Also, clean any surfaces that may have blood, body fluids, and/or secretions or excretions on them. °Wear gloves when cleaning surfaces the patient has come in contact with. °Use a diluted bleach solution (e.g., dilute bleach with 1 part bleach and 10 parts water) or a household disinfectant with a label that says EPA-registered for coronaviruses. To make a bleach solution at home, add 1 tablespoon of bleach to 1 quart (4 cups) of water. For a larger supply, add ¼ cup of bleach to 1 gallon (16 cups) of water. °Read labels of cleaning products and follow recommendations provided on   product labels. Labels contain instructions for safe and effective use of the cleaning product including precautions you should take when applying the product, such as wearing gloves or eye protection and making sure you have good ventilation during use of the product. °Remove gloves and wash hands immediately after cleaning. ° °Monitor yourself for signs and symptoms of illness °Caregivers and household members are considered close contacts, should monitor their health, and will be asked to limit °movement  outside of the home to the extent possible. Follow the monitoring steps for close contacts listed on the °symptom monitoring form. ° ° °? If you have additional questions, contact your local health department or call the epidemiologist on call at [phone] °(available 24/7). °? This guidance is subject to change. For the most up-to-date guidance from CDC, please refer to their website: °https://www.cdc.gov/coronavirus/2019-ncov/hcp/guidance-prevent-spread.html ° °

## 2018-07-16 NOTE — ED Notes (Signed)
Patient verbalizes understanding of discharge instructions. Opportunity for questioning and answering were provided, patient discharged from ED. 

## 2018-07-16 NOTE — ED Notes (Addendum)
Dr. Madilyn Hook aware of 2.7 lactic acid, no further orders recieved

## 2018-07-16 NOTE — ED Notes (Signed)
Pt ambulated within room with steady gait.  Pt felt short of breath, but SPO2 maintained above 97 at all times.

## 2018-07-16 NOTE — ED Triage Notes (Addendum)
Pt states he was here yesterday for having sob and was discharged , patient states he woke up this morning was sob and a non productive cough ; denies any chest pain ; upon ems arrival patient was wheezing , patient received 10 mg albuterol and 0.5 Atrovent and 125 mg solumedrol , pt reports feeling slightly better

## 2018-08-25 ENCOUNTER — Emergency Department (HOSPITAL_COMMUNITY)
Admission: EM | Admit: 2018-08-25 | Discharge: 2018-08-25 | Disposition: A | Discharge status: Home / Self Care | Payer: Self-pay | Attending: Emergency Medicine | Admitting: Emergency Medicine

## 2018-08-25 ENCOUNTER — Other Ambulatory Visit: Payer: Self-pay

## 2018-08-25 ENCOUNTER — Encounter (HOSPITAL_COMMUNITY): Payer: Self-pay

## 2018-08-25 DIAGNOSIS — J4520 Mild intermittent asthma, uncomplicated: Secondary | ICD-10-CM

## 2018-08-25 DIAGNOSIS — J45909 Unspecified asthma, uncomplicated: Secondary | ICD-10-CM | POA: Insufficient documentation

## 2018-08-25 DIAGNOSIS — J449 Chronic obstructive pulmonary disease, unspecified: Secondary | ICD-10-CM | POA: Insufficient documentation

## 2018-08-25 DIAGNOSIS — F17210 Nicotine dependence, cigarettes, uncomplicated: Secondary | ICD-10-CM | POA: Insufficient documentation

## 2018-08-25 DIAGNOSIS — Z79899 Other long term (current) drug therapy: Secondary | ICD-10-CM | POA: Insufficient documentation

## 2018-08-25 MED ORDER — ALBUTEROL SULFATE HFA 108 (90 BASE) MCG/ACT IN AERS: 2.0000 | INHALATION_SPRAY | Freq: Four times a day (QID) | RESPIRATORY_TRACT | 1 refills | Status: DC | PRN

## 2018-08-25 MED ORDER — ALBUTEROL SULFATE (2.5 MG/3ML) 0.083% IN NEBU: 2.5000 mg | INHALATION_SOLUTION | Freq: Four times a day (QID) | RESPIRATORY_TRACT | 1 refills | Status: DC | PRN

## 2018-08-25 NOTE — ED Triage Notes (Signed)
Pt states that he has been out of his inhaler x 1 week. Pt states that he feels his asthma flaring up. Pt wheezing in triage.

## 2018-08-25 NOTE — ED Provider Notes (Signed)
[facility] Note   CSN: [id number] Arrival date & time: 08/25/18  1754    History   Chief Complaint Chief Complaint  Patient presents with  . Asthma    HPI Devin [name] is a 38 y.o. male with past medical history of asthma, seasonal allergies, presenting to the emergency department with request for medication refill.  Patient states he is out of his albuterol inhaler for 1 week.  This morning he woke up feeling short of breath with some wheezing.  He states he tried to sleep it off, and this afternoon he had a little bit of dry cough.  He states his symptoms have completely resolved this evening.  He denies fever or any current respiratory symptoms at this time.  He states he does have lots of issues with seasonal allergies, however has not taken any antihistamines recently for sx.  He requests a work note today.  He states he is in the process of establishing care with [facility] for regular management of his asthma.  No contact with COVID positive people.    The history is provided by the patient.    Past Medical History:  Diagnosis Date  . ADHD (attention deficit hyperactivity disorder)   . Asthma   . Bipolar 1 disorder (HCC)   . GERD (gastroesophageal reflux disease)   . Transaminitis 07/17/2015    Patient Active Problem List   Diagnosis Date Noted  . Substance induced mood disorder (HCC) 03/07/2018  . Depression   . 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 Bipolar II disorder (HCC) 01/31/2018  . Hypoxia   . COPD exacerbation (HCC)   . Leukocytosis 11/04/2015  . Transaminitis 07/17/2015  . Tooth ache 07/17/2015  . Asthma with status asthmaticus 10/14/2011  . ADHD (attention deficit hyperactivity disorder) 10/14/2011    Past Surgical History:  Procedure Laterality Date  . TYMPANOPLASTY Left 1997        Home Medications    Prior to Admission medications    Medication Sig Start Date End Date Taking? Authorizing Provider  acetaminophen (TYLENOL) 500 MG tablet Take 1 tablet (500 mg total) by mouth every 6 (six) hours as needed. Patient not taking: Reported on 07/14/2018 04/08/18   Emi Holes, PA-C  albuterol (PROVENTIL) (2.5 MG/3ML) 0.083% nebulizer solution Take 3 mLs (2.5 mg total) by nebulization every 6 (six) hours as needed for wheezing or shortness of breath. 08/25/18   Robinson, [place] N, PA-C  albuterol (VENTOLIN HFA) 108 (90 Base) MCG/ACT inhaler Inhale 2 puffs into the lungs every 6 (six) hours as needed for wheezing or shortness of breath. 08/25/18   Robinson, [place] N, PA-C  benzonatate (TESSALON) 100 MG capsule Take 1 capsule (100 mg total) by mouth 3 (three) times daily as needed for cough. 07/16/18   Tilden Fossa, MD  fluticasone (FLOVENT HFA) 44 MCG/ACT inhaler Inhale 2 puffs into the lungs 2 (two) times daily. For asthma Patient not taking: Reported on 04/08/2018 02/09/18   Armandina Stammer I, NP  gabapentin (NEURONTIN) 400 MG capsule Take 1 capsule (400 mg total) by mouth 3 (three) times daily. For agitation Patient not taking: Reported on 04/08/2018 02/09/18   Armandina Stammer I, NP  hydrOXYzine (ATARAX/VISTARIL) 25 MG tablet Take 1 tablet (25 mg) by mouth three times daily & 2 tablets (50 mg) at bedtime for anxiety/sleep Patient not taking: Reported on 04/08/2018 02/09/18   Armandina Stammer I, NP  mirtazapine (REMERON) 30 MG  tablet Take 1 tablet (30 mg total) by mouth at bedtime. For depression Patient not taking: Reported on 04/08/2018 02/09/18   Armandina Stammer I, NP  OLANZapine (ZYPREXA) 2.5 MG tablet Take 1 tablet (2.5 mg total) by mouth at bedtime. For mood control Patient not taking: Reported on 04/08/2018 02/09/18   Armandina Stammer I, NP  ondansetron (ZOFRAN) 4 MG tablet Take 1 tablet (4 mg total) by mouth every 6 (six) hours. Patient not taking: Reported on 07/14/2018 04/08/18   Emi Holes, PA-C  pantoprazole (PROTONIX) 40 MG tablet  Take 1 tablet (40 mg total) by mouth daily. For acid reflux Patient not taking: Reported on 04/08/2018 02/10/18   Armandina Stammer I, NP  predniSONE (DELTASONE) 10 MG tablet Take 4 tablets (40 mg total) by mouth daily. 07/16/18   Tilden Fossa, MD  promethazine (PHENERGAN) 25 MG tablet Take 1 tablet (25 mg total) by mouth every 6 (six) hours as needed for nausea. Patient not taking: Reported on 07/14/2018 06/04/18   Benjiman Core, MD    Family History Family History  Problem Relation Age of Onset  . Coronary artery disease Father   . Heart attack Father   . Heart disease Father   . Heart failure Mother   . Hyperlipidemia Mother   . Hypertension Mother   . Migraines Mother   . COPD Mother   . Diabetes Maternal Aunt   . Asthma Maternal Grandfather   . Asthma Paternal Grandfather     Social History Social History   Tobacco Use  . Smoking status: Current Every Day Smoker    Packs/day: 0.50    Types: Cigarettes  . Smokeless tobacco: Never Used  Substance Use Topics  . Alcohol use: Yes    Alcohol/week: 0.0 standard drinks  . Drug use: Yes    Types: Marijuana, Cocaine    Comment:                       Allergies   Augmentin [amoxicillin-pot clavulanate] and Other   Review of Systems Review of Systems  All other systems reviewed and are negative.    Physical Exam Updated Vital Signs BP (!) 131/93 (BP Location: Right Arm)   Pulse 78   Temp 98.1 F (36.7 C) (Oral)   Resp 15   Ht 5\' 9"  (1.753 m)   Wt 83.9 kg   SpO2 100%   BMI 27.32 kg/m   Physical Exam Vitals signs and nursing note reviewed.  Constitutional:      General: He is not in acute distress.    Appearance: He is well-developed.  HENT:     Head: Normocephalic and atraumatic.     Mouth/Throat:     Mouth: Mucous membranes are moist.     Pharynx: Oropharynx is clear.  Eyes:     Conjunctiva/sclera: Conjunctivae normal.  Cardiovascular:     Rate and Rhythm: Normal rate and regular rhythm.  Pulmonary:      Effort: Pulmonary effort is normal. No respiratory distress.     Breath sounds: Normal breath sounds.     Comments: Speaking in full sentences Neurological:     Mental Status: He is alert.  Psychiatric:        Mood and Affect: Mood normal.        Behavior: Behavior normal.      ED Treatments / Results  Labs (all labs ordered are listed, but only abnormal results are displayed) Labs Reviewed - No data to display  EKG  None  Radiology No results found.  Procedures Procedures (including critical care time)  Medications Ordered in ED Medications - No data to display   Initial Impression / Assessment and Plan / ED Course  I have reviewed the triage vital signs and the nursing notes.  Pertinent labs & imaging results that were available during my care of the patient were reviewed by me and considered in my medical decision making (see chart for details).        Patient with history of asthma, presenting with request for albuterol refill and work note.  He states earlier today he was having some shortness of breath with wheezing, however symptoms have completely resolved prior to evaluation this evening.  He states he does not feel like he is having an asthma exacerbation, he just was out of his albuterol inhaler and needed it today.  On exam, he is well-appearing, speaking in full sentences.  Lungs are clear bilaterally.  Vital signs are stable, afebrile.  O2 saturation 100% on room air. Presentation not consistent with exacerbation. Albuterol refill provided.  Encouraged he establish primary care for regular management of his asthma, as patient has had multiple recent ED visits for management of his asthma.  Patient agreeable to plan and safe for discharge.  Discussed results, findings, treatment and follow up. Patient advised of return precautions. Patient verbalized understanding and agreed with plan.   Final Clinical Impressions(s) / ED Diagnoses   Final diagnoses:   Intermittent asthma without complication, unspecified asthma severity    ED Discharge Orders         Ordered    albuterol (VENTOLIN HFA) 108 (90 Base) MCG/ACT inhaler  Every 6 hours PRN     08/25/18 2024    albuterol (PROVENTIL) (2.5 MG/3ML) 0.083% nebulizer solution  Every 6 hours PRN     08/25/18 2024           Robinson, [place] N, PA-C 08/25/18 2025    Derwood KaplanNanavati, Ankit, MD 08/26/18 1948

## 2018-09-01 ENCOUNTER — Emergency Department (HOSPITAL_COMMUNITY)
Admission: EM | Admit: 2018-09-01 | Discharge: 2018-09-01 | Disposition: A | Discharge status: Home / Self Care | Payer: Self-pay | Attending: Emergency Medicine | Admitting: Emergency Medicine

## 2018-09-01 ENCOUNTER — Other Ambulatory Visit: Payer: Self-pay

## 2018-09-01 ENCOUNTER — Encounter (HOSPITAL_COMMUNITY): Payer: Self-pay

## 2018-09-01 DIAGNOSIS — R0602 Shortness of breath: Secondary | ICD-10-CM

## 2018-09-01 DIAGNOSIS — J449 Chronic obstructive pulmonary disease, unspecified: Secondary | ICD-10-CM | POA: Insufficient documentation

## 2018-09-01 DIAGNOSIS — F1290 Cannabis use, unspecified, uncomplicated: Secondary | ICD-10-CM | POA: Insufficient documentation

## 2018-09-01 DIAGNOSIS — F319 Bipolar disorder, unspecified: Secondary | ICD-10-CM | POA: Insufficient documentation

## 2018-09-01 DIAGNOSIS — F1490 Cocaine use, unspecified, uncomplicated: Secondary | ICD-10-CM | POA: Insufficient documentation

## 2018-09-01 DIAGNOSIS — R062 Wheezing: Secondary | ICD-10-CM | POA: Insufficient documentation

## 2018-09-01 DIAGNOSIS — Z87891 Personal history of nicotine dependence: Secondary | ICD-10-CM | POA: Insufficient documentation

## 2018-09-01 DIAGNOSIS — J45901 Unspecified asthma with (acute) exacerbation: Secondary | ICD-10-CM | POA: Insufficient documentation

## 2018-09-01 DIAGNOSIS — F909 Attention-deficit hyperactivity disorder, unspecified type: Secondary | ICD-10-CM | POA: Insufficient documentation

## 2018-09-01 MED ORDER — ALBUTEROL SULFATE (2.5 MG/3ML) 0.083% IN NEBU
5.0000 mg | INHALATION_SOLUTION | Freq: Once | RESPIRATORY_TRACT | Status: AC
  Administered 2018-09-01: 17:00:00 5 mg via RESPIRATORY_TRACT
  Filled 2018-09-01: qty 6

## 2018-09-01 MED ORDER — PREDNISONE 20 MG PO TABS
40.0000 mg | ORAL_TABLET | Freq: Once | ORAL | Status: AC
  Administered 2018-09-01: 40 mg via ORAL
  Filled 2018-09-01: qty 2

## 2018-09-01 MED ORDER — PREDNISONE 20 MG PO TABS: 40.0000 mg | ORAL_TABLET | Freq: Every day | ORAL | 0 refills | Status: AC

## 2018-09-01 MED ORDER — ALBUTEROL SULFATE HFA 108 (90 BASE) MCG/ACT IN AERS
2.0000 | INHALATION_SPRAY | Freq: Once | RESPIRATORY_TRACT | Status: AC
  Administered 2018-09-01: 21:00:00 2 via RESPIRATORY_TRACT
  Filled 2018-09-01: qty 6.7

## 2018-09-01 NOTE — ED Notes (Signed)
Patient verbalizes understanding of discharge instructions. Opportunity for questioning and answers were provided. Armband removed by staff, pt discharged from ED ambulatory.   

## 2018-09-01 NOTE — ED Provider Notes (Signed)
[name] EMERGENCY DEPARTMENT Provider Note   CSN: [id number] Arrival date & time: 09/01/18  1647    History   Chief Complaint Chief Complaint  Patient presents with  . Asthma    HPI Devin [name] is a 38 y.o. male.     The history is provided by medical records and the patient.  Asthma  This is a recurrent problem. The current episode started yesterday. The problem occurs constantly. The problem has not changed since onset.Associated symptoms include shortness of breath. Pertinent negatives include no chest pain, no abdominal pain and no headaches. Nothing aggravates the symptoms. Nothing relieves the symptoms. He has tried nothing for the symptoms. The treatment provided no relief.    Past Medical History:  Diagnosis Date  . ADHD (attention deficit hyperactivity disorder)   . Asthma   . Bipolar 1 disorder (HCC)   . GERD (gastroesophageal reflux disease)   . Transaminitis 07/17/2015    Patient Active Problem List   Diagnosis Date Noted  . Substance induced mood disorder (HCC) 03/07/2018  . Depression   . 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 Bipolar II disorder (HCC) 01/31/2018  . Hypoxia   . COPD exacerbation (HCC)   . Leukocytosis 11/04/2015  . Transaminitis 07/17/2015  . Tooth ache 07/17/2015  . Asthma with status asthmaticus 10/14/2011  . ADHD (attention deficit hyperactivity disorder) 10/14/2011    Past Surgical History:  Procedure Laterality Date  . TYMPANOPLASTY Left 1997        Home Medications    Prior to Admission medications   Medication Sig Start Date End Date Taking? Authorizing Provider  acetaminophen (TYLENOL) 500 MG tablet Take 1 tablet (500 mg total) by mouth every 6 (six) hours as needed. Patient not taking: Reported on 07/14/2018 04/08/18   Emi Holes, PA-C  albuterol (PROVENTIL) (2.5 MG/3ML) 0.083% nebulizer solution Take 3 mLs (2.5 mg total) by nebulization  every 6 (six) hours as needed for wheezing or shortness of breath. 08/25/18   Robinson, [place] N, PA-C  albuterol (VENTOLIN HFA) 108 (90 Base) MCG/ACT inhaler Inhale 2 puffs into the lungs every 6 (six) hours as needed for wheezing or shortness of breath. 08/25/18   Robinson, [place] N, PA-C  benzonatate (TESSALON) 100 MG capsule Take 1 capsule (100 mg total) by mouth 3 (three) times daily as needed for cough. 07/16/18   Tilden Fossa, MD  fluticasone (FLOVENT HFA) 44 MCG/ACT inhaler Inhale 2 puffs into the lungs 2 (two) times daily. For asthma Patient not taking: Reported on 04/08/2018 02/09/18   Armandina Stammer I, NP  gabapentin (NEURONTIN) 400 MG capsule Take 1 capsule (400 mg total) by mouth 3 (three) times daily. For agitation Patient not taking: Reported on 04/08/2018 02/09/18   Armandina Stammer I, NP  hydrOXYzine (ATARAX/VISTARIL) 25 MG tablet Take 1 tablet (25 mg) by mouth three times daily & 2 tablets (50 mg) at bedtime for anxiety/sleep Patient not taking: Reported on 04/08/2018 02/09/18   Armandina Stammer I, NP  mirtazapine (REMERON) 30 MG tablet Take 1 tablet (30 mg total) by mouth at bedtime. For depression Patient not taking: Reported on 04/08/2018 02/09/18   Armandina Stammer I, NP  OLANZapine (ZYPREXA) 2.5 MG tablet Take 1 tablet (2.5 mg total) by mouth at bedtime. For mood control Patient not taking: Reported on 04/08/2018 02/09/18   Armandina Stammer I, NP  ondansetron (ZOFRAN) 4 MG tablet Take 1 tablet (4 mg total) by mouth every 6 (six) hours. Patient  not taking: Reported on 07/14/2018 04/08/18   Emi Holes, PA-C  pantoprazole (PROTONIX) 40 MG tablet Take 1 tablet (40 mg total) by mouth daily. For acid reflux Patient not taking: Reported on 04/08/2018 02/10/18   Armandina Stammer I, NP  predniSONE (DELTASONE) 10 MG tablet Take 4 tablets (40 mg total) by mouth daily. 07/16/18   Tilden Fossa, MD  promethazine (PHENERGAN) 25 MG tablet Take 1 tablet (25 mg total) by mouth every 6 (six) hours as needed for  nausea. Patient not taking: Reported on 07/14/2018 06/04/18   Benjiman Core, MD    Family History Family History  Problem Relation Age of Onset  . Coronary artery disease Father   . Heart attack Father   . Heart disease Father   . Heart failure Mother   . Hyperlipidemia Mother   . Hypertension Mother   . Migraines Mother   . COPD Mother   . Diabetes Maternal Aunt   . Asthma Maternal Grandfather   . Asthma Paternal Grandfather     Social History Social History   Tobacco Use  . Smoking status: Former Smoker    Packs/day: 0.50    Types: Cigarettes    Last attempt to quit: 03/03/2018    Years since quitting: 0.4  . Smokeless tobacco: Never Used  Substance Use Topics  . Alcohol use: Yes    Alcohol/week: 0.0 standard drinks  . Drug use: Yes    Types: Marijuana, Cocaine    Comment:                       Allergies   Augmentin [amoxicillin-pot clavulanate] and Other   Review of Systems Review of Systems  Constitutional: Negative for chills, fatigue and fever.  HENT: Negative for congestion and rhinorrhea.   Respiratory: Positive for shortness of breath and wheezing. Negative for cough, chest tightness and stridor.   Cardiovascular: Negative for chest pain, palpitations and leg swelling.  Gastrointestinal: Negative for abdominal pain, constipation, diarrhea, nausea and vomiting.  Genitourinary: Negative for flank pain.  Musculoskeletal: Negative for back pain, neck pain and neck stiffness.  Skin: Negative for rash and wound.  Neurological: Negative for light-headedness and headaches.  Psychiatric/Behavioral: Negative for agitation.  All other systems reviewed and are negative.    Physical Exam Updated Vital Signs BP (!) 123/98 (BP Location: Left Arm)   Pulse 90   Temp 98.2 F (36.8 C) (Oral)   Resp (!) 28   Ht  (1.753 m)   Wt 83.9 kg   SpO2 100%   BMI 27.32 kg/m   Physical Exam Vitals signs and nursing note reviewed.  Constitutional:       General: He is not in acute distress.    Appearance: He is well-developed. He is not ill-appearing, toxic-appearing or diaphoretic.  HENT:     Head: Normocephalic and atraumatic.     Nose: Nose normal. No congestion or rhinorrhea.  Eyes:     Conjunctiva/sclera: Conjunctivae normal.     Pupils: Pupils are equal, round, and reactive to light.  Neck:     Musculoskeletal: Neck supple.  Cardiovascular:     Rate and Rhythm: Normal rate and regular rhythm.     Pulses: Normal pulses.     Heart sounds: No murmur.  Pulmonary:     Effort: Tachypnea present.     Breath sounds: Wheezing present. No rhonchi or rales.  Chest:     Chest wall: No tenderness.  Abdominal:  Palpations: Abdomen is soft.     Tenderness: There is no abdominal tenderness. There is no right CVA tenderness or left CVA tenderness.  Musculoskeletal:        General: No tenderness.     Right lower leg: No edema.     Left lower leg: No edema.  Skin:    General: Skin is warm and dry.     Capillary Refill: Capillary refill takes less than 2 seconds.     Findings: No erythema.  Neurological:     General: No focal deficit present.     Mental Status: He is alert.  Psychiatric:        Mood and Affect: Mood normal.      ED Treatments / Results  Labs (all labs ordered are listed, but only abnormal results are displayed) Labs Reviewed - No data to display  EKG None  Radiology No results found.  Procedures Procedures (including critical care time)  Medications Ordered in ED Medications  albuterol (PROVENTIL) (2.5 MG/3ML) 0.083% nebulizer solution 5 mg (5 mg Nebulization Given 09/01/18 1654)  predniSONE (DELTASONE) tablet 40 mg (40 mg Oral Given 09/01/18 1718)  albuterol (VENTOLIN HFA) 108 (90 Base) MCG/ACT inhaler 2 puff (2 puffs Inhalation Given 09/01/18 2037)     Initial Impression / Assessment and Plan / ED Course  I have reviewed the triage vital signs and the nursing notes.  Pertinent labs & imaging  results that were available during my care of the patient were reviewed by me and considered in my medical decision making (see chart for details).        Lethea KillingsMark Newhart is a 38 y.o. male with a long history of asthma with exacerbations, COPD, ADHD, bipolar disorder, and substance abuse who presents with wheezing and shortness of breath.  Patient reports he has been out of his inhaler all week.  He reports significant wheezing and shortness of breath.  No chest pain.  He denies any cough that is new.  He denies any fevers or chills.  No URI symptoms.  He reports that the pollen and temperature changes have been setting off.  He says that this is not as bad as some flares she has had.  He denies any other complaints including no abdominal pain, nausea vomiting, urinary symptoms or GI symptoms.  On exam, patient is wheezing in all lung fields.  Patient was given albuterol nebulizer in triage with improvement in symptoms.  No rhonchi or crackles.  Chest and abdomen nontender.  Patient moving all extremities.  Patient is alert and oriented.  Patient is now on nasal cannula oxygen saturation with his mild to moderate wheezing.  Shared decision-making conversation held with patient.  He does not want IV or x-rays at this time.  He is agreeable to taking oral prednisone and then starting him on prednisone for discharge.  He will be monitored for period of time and will get more albuterol as needed.  Anticipate discharge after improvement in his asthma exacerbation.  Lower suspicion for coronavirus, pneumonia, PE, or other cause of shortness of breath at this time.  Patient reports this is absolutely consistent with prior asthma exacerbations.  Patient's wheezing improved.  Patient given a burst of steroids and will take inhaler at home.  Patient resent return precautions and understood plan of care.  Patient discharged in good condition with resolution of wheezing and shortness of breath.     Final Clinical  Impressions(s) / ED Diagnoses   Final diagnoses:  Exacerbation of  asthma, unspecified asthma severity, unspecified whether persistent  Wheezing  Shortness of breath    ED Discharge Orders         Ordered    predniSONE (DELTASONE) 20 MG tablet  Daily     09/01/18 2034          Clinical Impression: 1. Exacerbation of asthma, unspecified asthma severity, unspecified whether persistent   2. Wheezing   3. Shortness of breath     Disposition: Discharge  Condition: Good  I have discussed the results, Dx and Tx plan with the pt(& family if present). He/she/they expressed understanding and agree(s) with the plan. Discharge instructions discussed at great length. Strict return precautions discussed and pt &/or family have verbalized understanding of the instructions. No further questions at time of discharge.    Discharge Medication List as of 09/01/2018  8:34 PM    START taking these medications   Details  !! predniSONE (DELTASONE) 20 MG tablet Take 2 tablets (40 mg total) by mouth daily for 3 days., Starting Wed 09/02/2018, Until Sat 09/05/2018, Print     !! - Potential duplicate medications found. Please discuss with provider.      Follow Up: [facility] [address] [place] [address] [phone] Schedule an appointment as soon as possible for a visit    [facility] EMERGENCY DEPARTMENT [address] [id number] mc [place] [address] [phone]       , Canary Brim, MD 09/02/18 [phone]

## 2018-09-01 NOTE — Discharge Instructions (Signed)
Your history and exam today are consistent with asthma attack.  As we got your breathing under control with the steroids and breathing treatment, we feel you are safe for discharge home.  Please use the inhaler at home as directed.  Please use the steroids for the next 3 days starting tomorrow.  You received today's dose already.  Please follow-up with your PCP and if any symptoms change or worsen, return to nearest emergency department.

## 2018-09-01 NOTE — ED Triage Notes (Addendum)
Pt endorses Severe asthma exacerbation;. Pt observed to be extremely shob, wheezing, moving minimal amount of air. Out of inhaler x 1 week. Speaking in 1-2 word sentences. No hx of intubation.

## 2018-09-01 NOTE — ED Notes (Signed)
Pt rapidly improving with breathing treatment.

## 2018-09-25 ENCOUNTER — Encounter (HOSPITAL_COMMUNITY): Payer: Self-pay | Admitting: *Deleted

## 2018-09-25 ENCOUNTER — Emergency Department (HOSPITAL_COMMUNITY)
Admission: EM | Admit: 2018-09-25 | Discharge: 2018-09-25 | Disposition: A | Discharge status: Home / Self Care | Payer: Self-pay | Attending: Emergency Medicine | Admitting: Emergency Medicine

## 2018-09-25 ENCOUNTER — Emergency Department (HOSPITAL_COMMUNITY): Payer: Self-pay

## 2018-09-25 ENCOUNTER — Other Ambulatory Visit: Payer: Self-pay

## 2018-09-25 DIAGNOSIS — Z87891 Personal history of nicotine dependence: Secondary | ICD-10-CM | POA: Insufficient documentation

## 2018-09-25 DIAGNOSIS — M545 Low back pain, unspecified: Secondary | ICD-10-CM

## 2018-09-25 DIAGNOSIS — R109 Unspecified abdominal pain: Secondary | ICD-10-CM | POA: Insufficient documentation

## 2018-09-25 DIAGNOSIS — Z79899 Other long term (current) drug therapy: Secondary | ICD-10-CM | POA: Insufficient documentation

## 2018-09-25 DIAGNOSIS — J4520 Mild intermittent asthma, uncomplicated: Secondary | ICD-10-CM | POA: Insufficient documentation

## 2018-09-25 LAB — CBC WITH DIFFERENTIAL/PLATELET
Abs Immature Granulocytes: 0.03 10*3/uL (ref 0.00–0.07)
Basophils Absolute: 0 10*3/uL (ref 0.0–0.1)
Basophils Relative: 0 %
Eosinophils Absolute: 0.6 10*3/uL — ABNORMAL HIGH (ref 0.0–0.5)
Eosinophils Relative: 7 %
HCT: 47.6 % (ref 39.0–52.0)
Hemoglobin: 16.4 g/dL (ref 13.0–17.0)
Immature Granulocytes: 0 %
Lymphocytes Relative: 25 %
Lymphs Abs: 2.2 10*3/uL (ref 0.7–4.0)
MCH: 30.8 pg (ref 26.0–34.0)
MCHC: 34.5 g/dL (ref 30.0–36.0)
MCV: 89.5 fL (ref 80.0–100.0)
Monocytes Absolute: 0.7 10*3/uL (ref 0.1–1.0)
Monocytes Relative: 7 %
Neutro Abs: 5.5 10*3/uL (ref 1.7–7.7)
Neutrophils Relative %: 61 %
Platelets: 378 10*3/uL (ref 150–400)
RBC: 5.32 MIL/uL (ref 4.22–5.81)
RDW: 12.1 % (ref 11.5–15.5)
WBC: 9 10*3/uL (ref 4.0–10.5)
nRBC: 0 % (ref 0.0–0.2)

## 2018-09-25 LAB — COMPREHENSIVE METABOLIC PANEL
ALT: 23 U/L (ref 0–44)
AST: 25 U/L (ref 15–41)
Albumin: 4 g/dL (ref 3.5–5.0)
Alkaline Phosphatase: 58 U/L (ref 38–126)
Anion gap: 11 (ref 5–15)
BUN: 11 mg/dL (ref 6–20)
CO2: 19 mmol/L — ABNORMAL LOW (ref 22–32)
Calcium: 9.4 mg/dL (ref 8.9–10.3)
Chloride: 106 mmol/L (ref 98–111)
Creatinine, Ser: 1.04 mg/dL (ref 0.61–1.24)
GFR calc Af Amer: >60 mL/min (ref >60)
GFR calc non Af Amer: >60 mL/min (ref >60)
Glucose, Bld: 91 mg/dL (ref 70–99)
Potassium: 3.9 mmol/L (ref 3.5–5.1)
Sodium: 136 mmol/L (ref 135–145)
Total Bilirubin: 0.7 mg/dL (ref 0.3–1.2)
Total Protein: 7.2 g/dL (ref 6.5–8.1)

## 2018-09-25 LAB — LIPASE, BLOOD: Lipase: 26 U/L (ref 11–51)

## 2018-09-25 MED ORDER — PREDNISONE 10 MG PO TABS: 40.0000 mg | ORAL_TABLET | Freq: Every day | ORAL | 0 refills | Status: AC

## 2018-09-25 MED ORDER — CYCLOBENZAPRINE HCL 10 MG PO TABS: 10.0000 mg | ORAL_TABLET | Freq: Two times a day (BID) | ORAL | 0 refills | Status: AC | PRN

## 2018-09-25 MED ORDER — MORPHINE SULFATE (PF) 4 MG/ML IV SOLN
4.0000 mg | Freq: Once | INTRAVENOUS | Status: AC
  Administered 2018-09-25: 4 mg via INTRAVENOUS
  Filled 2018-09-25: qty 1

## 2018-09-25 MED ORDER — ALBUTEROL SULFATE HFA 108 (90 BASE) MCG/ACT IN AERS
4.0000 | INHALATION_SPRAY | Freq: Once | RESPIRATORY_TRACT | Status: AC
  Administered 2018-09-25: 4 via RESPIRATORY_TRACT
  Filled 2018-09-25: qty 6.7

## 2018-09-25 MED ORDER — IPRATROPIUM BROMIDE HFA 17 MCG/ACT IN AERS
2.0000 | INHALATION_SPRAY | Freq: Once | RESPIRATORY_TRACT | Status: AC
  Administered 2018-09-25: 2 via RESPIRATORY_TRACT
  Filled 2018-09-25: qty 12.9

## 2018-09-25 MED ORDER — PREDNISONE 20 MG PO TABS
60.0000 mg | ORAL_TABLET | Freq: Once | ORAL | Status: AC
  Administered 2018-09-25: 60 mg via ORAL
  Filled 2018-09-25: qty 3

## 2018-09-25 MED ORDER — KETOROLAC TROMETHAMINE 15 MG/ML IJ SOLN
15.0000 mg | Freq: Once | INTRAMUSCULAR | Status: AC
  Administered 2018-09-25: 15 mg via INTRAVENOUS
  Filled 2018-09-25: qty 1

## 2018-09-25 NOTE — ED Provider Notes (Signed)
Devin [name] [facility] EMERGENCY DEPARTMENT Provider Note   CSN: [id number] Arrival date & time: 09/25/18  1229    History   Chief Complaint Chief Complaint  Patient presents with  . Asthma  . Back Pain    HPI Devin [name] is a 38 y.o. male.     Patient is a 39 year old gentleman with past medical history of bipolar disorder, ADHD, asthma who presents emergency department today for asthma exacerbation as well as flank pain.  Patient has been seen multiple times in the emergency department for asthma exacerbation just this year alone.  Patient does not have a primary care doctor.  Reports that he ran out of his inhaler 2 days ago and then this morning began to feel more short of breath and wheezing.  Reports this feels just like an asthma exacerbation that he has had multiple times.  Denies any chest pain, nausea, vomiting, fever, coughing, sick contacts.  Also reports that he has left flank pain for the last 3 days which radiates into his left upper quadrant.  Reports that it is extremely painful and brings him to his knees.  Reports that it only gets better with some application of a heating pad.  Denies any dysuria, hematuria, penile discharge.  Reports not having flank pain in the past associated with his asthma     Past Medical History:  Diagnosis Date  . ADHD (attention deficit hyperactivity disorder)   . Asthma   . Bipolar 1 disorder (HCC)   . GERD (gastroesophageal reflux disease)   . Transaminitis 07/17/2015    Patient Active Problem List   Diagnosis Date Noted  . Substance induced mood disorder (HCC) 03/07/2018  . Depression   . 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 Bipolar II disorder (HCC) 01/31/2018  . Hypoxia   . COPD exacerbation (HCC)   . Leukocytosis 11/04/2015  . Transaminitis 07/17/2015  . Tooth ache 07/17/2015  . Asthma with status asthmaticus 10/14/2011  . ADHD (attention deficit  hyperactivity disorder) 10/14/2011    Past Surgical History:  Procedure Laterality Date  . TYMPANOPLASTY Left 1997        Home Medications    Prior to Admission medications   Medication Sig Start Date End Date Taking? Authorizing Provider  albuterol (PROVENTIL) (2.5 MG/3ML) 0.083% nebulizer solution Take 3 mLs (2.5 mg total) by nebulization every 6 (six) hours as needed for wheezing or shortness of breath. 08/25/18  Yes Robinson, [place] N, PA-C  albuterol (VENTOLIN HFA) 108 (90 Base) MCG/ACT inhaler Inhale 2 puffs into the lungs every 6 (six) hours as needed for wheezing or shortness of breath. 08/25/18  Yes Roxan Hockey, [place] N, PA-C  fluticasone (FLOVENT HFA) 44 MCG/ACT inhaler Inhale 2 puffs into the lungs 2 (two) times daily. For asthma 02/09/18  Yes Armandina Stammer I, NP  acetaminophen (TYLENOL) 500 MG tablet Take 1 tablet (500 mg total) by mouth every 6 (six) hours as needed. Patient not taking: Reported on 07/14/2018 04/08/18   Emi Holes, PA-C  benzonatate (TESSALON) 100 MG capsule Take 1 capsule (100 mg total) by mouth 3 (three) times daily as needed for cough. Patient not taking: Reported on 09/25/2018 07/16/18   Tilden Fossa, MD  cyclobenzaprine (FLEXERIL) 10 MG tablet Take 1 tablet (10 mg total) by mouth 2 (two) times daily as needed for up to 7 days for muscle spasms. 09/25/18 10/02/18  Ronnie Doss A, PA-C  gabapentin (NEURONTIN) 400 MG capsule Take 1  capsule (400 mg total) by mouth 3 (three) times daily. For agitation Patient not taking: Reported on 04/08/2018 02/09/18   Armandina StammerNwoko, Agnes I, NP  hydrOXYzine (ATARAX/VISTARIL) 25 MG tablet Take 1 tablet (25 mg) by mouth three times daily & 2 tablets (50 mg) at bedtime for anxiety/sleep Patient not taking: Reported on 04/08/2018 02/09/18   Armandina StammerNwoko, Agnes I, NP  mirtazapine (REMERON) 30 MG tablet Take 1 tablet (30 mg total) by mouth at bedtime. For depression Patient not taking: Reported on 04/08/2018 02/09/18   Armandina StammerNwoko, Agnes I, NP   OLANZapine (ZYPREXA) 2.5 MG tablet Take 1 tablet (2.5 mg total) by mouth at bedtime. For mood control Patient not taking: Reported on 04/08/2018 02/09/18   Armandina StammerNwoko, Agnes I, NP  ondansetron (ZOFRAN) 4 MG tablet Take 1 tablet (4 mg total) by mouth every 6 (six) hours. Patient not taking: Reported on 07/14/2018 04/08/18   Emi HolesLaw, Alexandra M, PA-C  pantoprazole (PROTONIX) 40 MG tablet Take 1 tablet (40 mg total) by mouth daily. For acid reflux Patient not taking: Reported on 04/08/2018 02/10/18   Armandina StammerNwoko, Agnes I, NP  predniSONE (DELTASONE) 10 MG tablet Take 4 tablets (40 mg total) by mouth daily for 5 days. 09/25/18 09/30/18  Arlyn DunningMcLean, Mareli Antunes A, PA-C  promethazine (PHENERGAN) 25 MG tablet Take 1 tablet (25 mg total) by mouth every 6 (six) hours as needed for nausea. Patient not taking: Reported on 07/14/2018 06/04/18   Benjiman CorePickering, Nathan, MD    Family History Family History  Problem Relation Age of Onset  . Coronary artery disease Father   . Heart attack Father   . Heart disease Father   . Heart failure Mother   . Hyperlipidemia Mother   . Hypertension Mother   . Migraines Mother   . COPD Mother   . Diabetes Maternal Aunt   . Asthma Maternal Grandfather   . Asthma Paternal Grandfather     Social History Social History   Tobacco Use  . Smoking status: Former Smoker    Packs/day: 0.50    Types: Cigarettes    Last attempt to quit: 03/03/2018    Years since quitting: 0.5  . Smokeless tobacco: Never Used  Substance Use Topics  . Alcohol use: Yes    Alcohol/week: 0.0 standard drinks  . Drug use: Yes    Types: Marijuana, Cocaine    Comment:                       Allergies   Augmentin [amoxicillin-pot clavulanate] and Other   Review of Systems Review of Systems  Constitutional: Negative.   HENT: Negative.   Eyes: Negative for photophobia.  Respiratory: Positive for chest tightness and shortness of breath. Negative for cough.   Cardiovascular: Negative for chest pain, palpitations and  leg swelling.  Gastrointestinal: Negative for abdominal pain, nausea and vomiting.  Genitourinary: Positive for flank pain. Negative for dysuria, frequency, hematuria and testicular pain.  Musculoskeletal: Negative for arthralgias, back pain and neck pain.  Skin: Negative for rash and wound.  Allergic/Immunologic: Negative for immunocompromised state.  Neurological: Negative for dizziness and headaches.     Physical Exam Updated Vital Signs BP 122/90   Pulse 83   Temp 98.2 F (36.8 C) (Oral)   Resp 16   SpO2 97%   Physical Exam Vitals signs and nursing note reviewed.  Constitutional:      General: He is not in acute distress.    Appearance: Normal appearance. He is not ill-appearing, toxic-appearing or diaphoretic.  HENT:     Head: Normocephalic.     Nose: Nose normal. No congestion.     Mouth/Throat:     Mouth: Mucous membranes are moist.  Eyes:     Conjunctiva/sclera: Conjunctivae normal.  Cardiovascular:     Rate and Rhythm: Normal rate and regular rhythm.  Pulmonary:     Breath sounds: Wheezing present. No rhonchi.  Chest:     Chest wall: No tenderness.  Abdominal:     General: Abdomen is flat. Bowel sounds are normal.     Tenderness: There is abdominal tenderness (LUQ). There is no right CVA tenderness or left CVA tenderness. Negative signs include Murphy's sign, Rovsing's sign and McBurney's sign.  Skin:    General: Skin is dry.  Neurological:     General: No focal deficit present.     Mental Status: He is alert.  Psychiatric:        Mood and Affect: Mood normal.        Thought Content: Thought content normal.      ED Treatments / Results  Labs (all labs ordered are listed, but only abnormal results are displayed) Labs Reviewed  CBC WITH DIFFERENTIAL/PLATELET - Abnormal; Notable for the following components:      Result Value   Eosinophils Absolute 0.6 (*)    All other components within normal limits  COMPREHENSIVE METABOLIC PANEL - Abnormal; Notable  for the following components:   CO2 19 (*)    All other components within normal limits  LIPASE, BLOOD  URINALYSIS, ROUTINE W REFLEX MICROSCOPIC    EKG None  Radiology Dg Chest Portable 1 View  Result Date: 09/25/2018 CLINICAL DATA:  Asthma. EXAM: PORTABLE CHEST 1 VIEW COMPARISON:  Radiograph of July 16, 2018. FINDINGS: The heart size and mediastinal contours are within normal limits. Both lungs are clear. The visualized skeletal structures are unremarkable. IMPRESSION: No active disease. Electronically Signed   By: Lupita Raider M.D.   On: 09/25/2018 13:02   Ct Renal Stone Study  Result Date: 09/25/2018 CLINICAL DATA:  Left flank pain EXAM: CT ABDOMEN AND PELVIS WITHOUT CONTRAST TECHNIQUE: Multidetector CT imaging of the abdomen and pelvis was performed following the standard protocol without IV contrast. COMPARISON:  None. FINDINGS: Lower chest: No acute abnormality. Multiple tiny pulmonary nodules in the bilateral lung bases (e.g. Series 5, image 8). Hepatobiliary: No solid liver abnormality is seen. No gallstones, gallbladder wall thickening, or biliary dilatation. Pancreas: Unremarkable. No pancreatic ductal dilatation or surrounding inflammatory changes. Spleen: Normal in size without significant abnormality. Adrenals/Urinary Tract: Adrenal glands are unremarkable. Kidneys are normal, without renal calculi, solid lesion, or hydronephrosis. Bladder is unremarkable. Stomach/Bowel: Stomach is within normal limits. Appendix appears normal. No evidence of bowel wall thickening, distention, or inflammatory changes. Vascular/Lymphatic: No significant vascular findings are present. No enlarged abdominal or pelvic lymph nodes. Reproductive: No mass or other significant abnormality. Other: No abdominal wall hernia or abnormality. No abdominopelvic ascites. Musculoskeletal: No acute or significant osseous findings. IMPRESSION: 1. No non-contrast CT findings of the abdomen or pelvis to explain left-sided  flank pain. There is no evidence of urinary tract calculus or hydronephrosis. 2. Multiple tiny pulmonary nodules in the bilateral lung bases (e.g. Series 5, image 8), nonspecific although likely sequelae of prior infection or inflammation. CT follow-up is optional for these nodules at 1 year if there are high risk factors for lung cancer such as cigarette smoking. Electronically Signed   By: Lauralyn Primes M.D.   On: 09/25/2018 14:26  Procedures Procedures (including critical care time)  Medications Ordered in ED Medications  albuterol (VENTOLIN HFA) 108 (90 Base) MCG/ACT inhaler 4 puff (4 puffs Inhalation Given 09/25/18 1257)  ipratropium (ATROVENT HFA) inhaler 2 puff (2 puffs Inhalation Given 09/25/18 1422)  predniSONE (DELTASONE) tablet 60 mg (60 mg Oral Given 09/25/18 1258)  ketorolac (TORADOL) 15 MG/ML injection 15 mg (15 mg Intravenous Given 09/25/18 1257)  morphine 4 MG/ML injection 4 mg (4 mg Intravenous Given 09/25/18 1530)     Initial Impression / Assessment and Plan / ED Course  I have reviewed the triage vital signs and the nursing notes.  Pertinent labs & imaging results that were available during my care of the patient were reviewed by me and considered in my medical decision making (see chart for details).  Clinical Course as of Sep 25 1607  Fri Sep 25, 2018  1339 Patient breathing improved with albuterol treatment.  Was also given oral prednisone.  He continues to have flank pain radiating to left upper quadrant even after Toradol.  Awaiting on labs and urinalysis to decide which type of imaging to proceed with.  Chest x-ray in regards to his asthma was normal.   [KM]  1552 Patient symptoms much improved with morphine.  Lab work is unremarkable.  CT scan is also unremarkable with no findings to explain patient's left side pain.  I reassessed the patient after his pain was better controlled and feel that this might be more musculoskeletal especially given the negative work-up.  Patient  is not having any urinary symptoms at this time.  Vitals are stable and he is now tolerating p.o. and would like to go home.  I will send patient home with some prescriptions.  I advised patient that he should really follow-up with a primary care doctor so he does not run out of his albuterol inhaler as he has done several times in the past.   [KM]    Clinical Course User Index [KM] Arlyn Dunning, PA-C       Based on review of vitals, medical screening exam, lab work and/or imaging, there does not appear to be an acute, emergent etiology for the patient's symptoms. Counseled pt on good return precautions and encouraged both PCP and ED follow-up as needed.  Prior to discharge, I also discussed incidental imaging findings with patient in detail and advised appropriate, recommended follow-up in detail.  Clinical Impression: 1. Left-sided low back pain without sciatica, unspecified chronicity   2. Mild intermittent asthma, unspecified whether complicated     Disposition: Discharge  Prior to providing a prescription for a controlled substance, I independently reviewed the patient's recent prescription history on the [place] Controlled Substance Reporting System. The patient had no recent or regular prescriptions and was deemed appropriate for a brief, less than 3 day prescription of narcotic for acute analgesia.  This note was prepared with assistance of Conservation officer, historic buildings. Occasional wrong-word or sound-a-like substitutions may have occurred due to the inherent limitations of voice recognition software.   Final Clinical Impressions(s) / ED Diagnoses   Final diagnoses:  Left-sided low back pain without sciatica, unspecified chronicity  Mild intermittent asthma, unspecified whether complicated    ED Discharge Orders         Ordered    cyclobenzaprine (FLEXERIL) 10 MG tablet  2 times daily PRN     09/25/18 1608    predniSONE (DELTASONE) 10 MG tablet  Daily      09/25/18 1608  Jeral Pinch 09/25/18 1609    Azalia Bilis, MD 09/25/18 [phone]

## 2018-09-25 NOTE — ED Triage Notes (Addendum)
Pt in c/o asthma exacerbation for the last few hours, out of his inhaler at home for the last two days, denies cough or fever, also c/o left lower back pain for the last few days, pain is increased with urination, denies blood in urine

## 2018-09-25 NOTE — ED Notes (Signed)
Pt. Aware urine is needed. Unable to provide any at this time.  

## 2018-09-25 NOTE — Discharge Instructions (Signed)
Thank you for allowing me to care for you today. Please return to the emergency department if you have new or worsening symptoms. Take your medications as instructed.  ° °

## 2018-10-17 ENCOUNTER — Emergency Department (HOSPITAL_COMMUNITY)
Admission: EM | Admit: 2018-10-17 | Discharge: 2018-10-17 | Disposition: A | Discharge status: Home / Self Care | Payer: Self-pay | Attending: Emergency Medicine | Admitting: Emergency Medicine

## 2018-10-17 ENCOUNTER — Encounter (HOSPITAL_COMMUNITY): Payer: Self-pay

## 2018-10-17 ENCOUNTER — Other Ambulatory Visit: Payer: Self-pay

## 2018-10-17 ENCOUNTER — Emergency Department (HOSPITAL_COMMUNITY): Payer: Self-pay

## 2018-10-17 DIAGNOSIS — Z87891 Personal history of nicotine dependence: Secondary | ICD-10-CM | POA: Insufficient documentation

## 2018-10-17 DIAGNOSIS — J4541 Moderate persistent asthma with (acute) exacerbation: Secondary | ICD-10-CM | POA: Insufficient documentation

## 2018-10-17 MED ORDER — IPRATROPIUM BROMIDE 0.02 % IN SOLN: 0.5000 mg | Freq: Once | RESPIRATORY_TRACT | Status: DC

## 2018-10-17 MED ORDER — PREDNISONE 20 MG PO TABS
60.0000 mg | ORAL_TABLET | Freq: Once | ORAL | Status: AC
  Administered 2018-10-17: 10:00:00 60 mg via ORAL
  Filled 2018-10-17: qty 3

## 2018-10-17 MED ORDER — ALBUTEROL (5 MG/ML) CONTINUOUS INHALATION SOLN: 10.0000 mg/h | INHALATION_SOLUTION | RESPIRATORY_TRACT | Status: DC

## 2018-10-17 MED ORDER — AEROCHAMBER PLUS FLO-VU MEDIUM MISC
1.0000 | Freq: Once | Status: AC
  Administered 2018-10-17: 1
  Filled 2018-10-17: qty 1

## 2018-10-17 MED ORDER — ALBUTEROL SULFATE HFA 108 (90 BASE) MCG/ACT IN AERS
2.0000 | INHALATION_SPRAY | Freq: Four times a day (QID) | RESPIRATORY_TRACT | Status: DC
  Administered 2018-10-17: 10:00:00 2 via RESPIRATORY_TRACT
  Filled 2018-10-17: qty 6.7

## 2018-10-17 MED ORDER — PREDNISONE 50 MG PO TABS: 50.0000 mg | ORAL_TABLET | Freq: Every day | ORAL | 0 refills | Status: DC

## 2018-10-17 NOTE — Discharge Instructions (Addendum)
Return here as needed. Follow up with a primary doctor. °

## 2018-10-17 NOTE — ED Provider Notes (Signed)
[facility]-EMERGENCY DEPT Provider Note   CSN: [id number] Arrival date & time: 10/17/18  [id number]     History   Chief Complaint Chief Complaint  Patient presents with  . Shortness of Breath    HPI Devin [name] is a 38 y.o. male.     HPI Patient presents to the emergency department with an asthma exacerbation that started around midnight.  The patient states he is out of his inhalers.  The patient states that nothing seems make condition better but activity seems to make his wheezing worse.  The patient states he said no cough or fever.  Patient states that he did not take any medications prior to arrival for symptoms.  The patient denies chest pain, headache,blurred vision, neck pain, fever, cough, weakness, numbness, dizziness, anorexia, edema, abdominal pain, nausea, vomiting, diarrhea, rash, back pain, dysuria, hematemesis, bloody stool, near syncope, or syncope. Past Medical History:  Diagnosis Date  . ADHD (attention deficit hyperactivity disorder)   . Asthma   . Bipolar 1 disorder (HCC)   . GERD (gastroesophageal reflux disease)   . Transaminitis 07/17/2015    Patient Active Problem List   Diagnosis Date Noted  . Substance induced mood disorder (HCC) 03/07/2018  . Depression   . 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 Bipolar II disorder (HCC) 01/31/2018  . Hypoxia   . COPD exacerbation (HCC)   . Leukocytosis 11/04/2015  . Transaminitis 07/17/2015  . Tooth ache 07/17/2015  . Asthma with status asthmaticus 10/14/2011  . ADHD (attention deficit hyperactivity disorder) 10/14/2011    Past Surgical History:  Procedure Laterality Date  . TYMPANOPLASTY Left 1997        Home Medications    Prior to Admission medications   Medication Sig Start Date End Date Taking? Authorizing Provider  acetaminophen (TYLENOL) 500 MG tablet Take 1 tablet (500 mg total) by mouth every 6 (six) hours as needed.  04/08/18  Yes Law, Alexandra M, PA-C  albuterol (PROVENTIL) (2.5 MG/3ML) 0.083% nebulizer solution Take 3 mLs (2.5 mg total) by nebulization every 6 (six) hours as needed for wheezing or shortness of breath. 08/25/18  Yes Robinson, [place] N, PA-C  albuterol (VENTOLIN HFA) 108 (90 Base) MCG/ACT inhaler Inhale 2 puffs into the lungs every 6 (six) hours as needed for wheezing or shortness of breath. 08/25/18  Yes Robinson, [place] N, PA-C  benzonatate (TESSALON) 100 MG capsule Take 1 capsule (100 mg total) by mouth 3 (three) times daily as needed for cough. Patient not taking: Reported on 09/25/2018 07/16/18   Tilden Fossaees, Elizabeth, MD  fluticasone [facility] [address](FLOVENT HFA) 44 MCG/ACT inhaler Inhale 2 puffs into the lungs 2 (two) times daily. For asthma Patient not taking: Reported on 10/17/2018 02/09/18   Armandina StammerNwoko, Agnes I, NP  gabapentin (NEURONTIN) 400 MG capsule Take 1 capsule (400 mg total) by mouth 3 (three) times daily. For agitation Patient not taking: Reported on 04/08/2018 02/09/18   Armandina StammerNwoko, Agnes I, NP  hydrOXYzine (ATARAX/VISTARIL) 25 MG tablet Take 1 tablet (25 mg) by mouth three times daily & 2 tablets (50 mg) at bedtime for anxiety/sleep Patient not taking: Reported on 04/08/2018 02/09/18   Armandina StammerNwoko, Agnes I, NP  mirtazapine (REMERON) 30 MG tablet Take 1 tablet (30 mg total) by mouth at bedtime. For depression Patient not taking: Reported on 04/08/2018 02/09/18   Armandina StammerNwoko, Agnes I, NP  OLANZapine (ZYPREXA) 2.5 MG tablet Take 1 tablet (2.5 mg total) by mouth at bedtime. For mood control Patient not taking:  Reported on 04/08/2018 02/09/18   Armandina StammerNwoko, Agnes I, NP  ondansetron (ZOFRAN) 4 MG tablet Take 1 tablet (4 mg total) by mouth every 6 (six) hours. Patient not taking: Reported on 07/14/2018 04/08/18   Emi HolesLaw, Alexandra M, PA-C  pantoprazole (PROTONIX) 40 MG tablet Take 1 tablet (40 mg total) by mouth daily. For acid reflux Patient not taking: Reported on 04/08/2018 02/10/18   Armandina StammerNwoko, Agnes I, NP  predniSONE (DELTASONE) 50 MG  tablet Take 1 tablet (50 mg total) by mouth daily. 10/17/18   Tifanie Gardiner, Cristal Deerhristopher, PA-C  promethazine (PHENERGAN) 25 MG tablet Take 1 tablet (25 mg total) by mouth every 6 (six) hours as needed for nausea. Patient not taking: Reported on 07/14/2018 06/04/18   Benjiman CorePickering, Nathan, MD    Family History Family History  Problem Relation Age of Onset  . Coronary artery disease Father   . Heart attack Father   . Heart disease Father   . Heart failure Mother   . Hyperlipidemia Mother   . Hypertension Mother   . Migraines Mother   . COPD Mother   . Diabetes Maternal Aunt   . Asthma Maternal Grandfather   . Asthma Paternal Grandfather     Social History Social History   Tobacco Use  . Smoking status: Former Smoker    Packs/day: 0.50    Types: Cigarettes    Quit date: 09/01/2018    Years since quitting: 0.1  . Smokeless tobacco: Never Used  Substance Use Topics  . Alcohol use: Yes    Alcohol/week: 0.0 standard drinks  . Drug use: Yes    Types: Marijuana, Cocaine    Comment:                       Allergies   Augmentin [amoxicillin-pot clavulanate] and Other   Review of Systems Review of Systems  All other systems negative except as documented in the HPI. All pertinent positives and negatives as reviewed in the HPI. Physical Exam Updated Vital Signs BP 123/88 (BP Location: Left Arm)   Pulse 90   Temp 98 F (36.7 C) (Oral)   Resp 16   Ht 5\' 10"  (1.778 m)   Wt 81.6 kg   SpO2 98%   BMI 25.83 kg/m   Physical Exam Vitals signs and nursing note reviewed.  Constitutional:      General: He is not in acute distress.    Appearance: He is well-developed.  HENT:     Head: Normocephalic and atraumatic.  Eyes:     Pupils: Pupils are equal, round, and reactive to light.  Neck:     Musculoskeletal: Normal range of motion and neck supple.  Cardiovascular:     Rate and Rhythm: Normal rate and regular rhythm.     Heart sounds: Normal heart sounds. No murmur. No friction rub. No  gallop.   Pulmonary:     Effort: Pulmonary effort is normal. No respiratory distress.     Breath sounds: Examination of the right-upper field reveals wheezing. Examination of the left-upper field reveals wheezing. Examination of the right-middle field reveals wheezing. Examination of the left-middle field reveals wheezing. Examination of the right-lower field reveals wheezing. Examination of the left-lower field reveals wheezing. Wheezing present.     Comments: Patient has very tight breath sounds with expiratory wheezing. Abdominal:     General: Bowel sounds are normal. There is no distension.     Palpations: Abdomen is soft.     Tenderness: There is no abdominal tenderness.  Skin:    General: Skin is warm and dry.     Capillary Refill: Capillary refill takes less than 2 seconds.     Findings: No erythema or rash.  Neurological:     Mental Status: He is alert and oriented to person, place, and time.     Motor: No abnormal muscle tone.     Coordination: Coordination normal.  Psychiatric:        Behavior: Behavior normal.      ED Treatments / Results  Labs (all labs ordered are listed, but only abnormal results are displayed) Labs Reviewed - No data to display  EKG EKG Interpretation  Date/Time:  Saturday October 17 2018 08:46:54 EDT Ventricular Rate:  93 PR Interval:    QRS Duration: 86 QT Interval:  349 QTC Calculation: 435 R Axis:   21 Text Interpretation:  Sinus rhythm ST elev, probable normal early repol pattern No significant change since last tracing Confirmed by Isla Pence [phone]) on 10/17/2018 8:48:53 AM   Radiology Dg Chest 2 View  Result Date: 10/17/2018 CLINICAL DATA:  Shortness of breath, history of asthma. EXAM: CHEST - 2 VIEW COMPARISON:  Chest x-ray dated 09/25/2018 FINDINGS: Cardiomediastinal silhouette is within normal limits in size and configuration. Lungs are clear. Lung volumes are normal. No evidence of pneumonia. No pleural effusion. No pneumothorax  seen. Osseous structures about the chest are unremarkable. IMPRESSION: No active cardiopulmonary disease.  No evidence of pneumonia. Electronically Signed   By: Franki Cabot M.D.   On: 10/17/2018 09:28    Procedures Procedures (including critical care time)  Medications Ordered in ED Medications  albuterol (VENTOLIN HFA) 108 (90 Base) MCG/ACT inhaler 2 puff (2 puffs Inhalation Given 10/17/18 1014)  AeroChamber Plus Flo-Vu Medium MISC 1 each (has no administration in time range)  predniSONE (DELTASONE) tablet 60 mg (60 mg Oral Given 10/17/18 1014)     Initial Impression / Assessment and Plan / ED Course  I have reviewed the triage vital signs and the nursing notes.  Pertinent labs & imaging results that were available during my care of the patient were reviewed by me and considered in my medical decision making (see chart for details).        Patient was given prednisone here in the emergency department.  He was also sent home with pain albuterol MDI.  Advised patient he will need to return here for any worsening in his condition.Patient had no URI symptoms or fever but nursing was concerned about the hour long neb treatment and RT was not willing to perform this task as they felt nebulization was not allowed because of Covid 19.   Final Clinical Impressions(s) / ED Diagnoses   Final diagnoses:  Moderate persistent asthma with exacerbation    ED Discharge Orders         Ordered    predniSONE (DELTASONE) 50 MG tablet  Daily     10/17/18 [address] PA-C 10/17/18 1041    Isla Pence, MD 10/17/18 1114

## 2018-10-17 NOTE — ED Triage Notes (Signed)
Pt arrived c/o SOB has hx of asthma and does not have inhaler, pt has expiratory wheezes, and states that symptoms started today while at work, pt is able to speak in full sentences. RR 18 in triage @ 98% RA.

## 2018-10-25 ENCOUNTER — Emergency Department (HOSPITAL_COMMUNITY): Payer: Self-pay

## 2018-10-25 ENCOUNTER — Emergency Department (HOSPITAL_COMMUNITY)
Admission: EM | Admit: 2018-10-25 | Discharge: 2018-10-25 | Disposition: A | Discharge status: Home / Self Care | Payer: Self-pay | Attending: Emergency Medicine | Admitting: Emergency Medicine

## 2018-10-25 ENCOUNTER — Encounter (HOSPITAL_COMMUNITY): Payer: Self-pay

## 2018-10-25 ENCOUNTER — Other Ambulatory Visit: Payer: Self-pay

## 2018-10-25 DIAGNOSIS — J45909 Unspecified asthma, uncomplicated: Secondary | ICD-10-CM | POA: Insufficient documentation

## 2018-10-25 DIAGNOSIS — Y9389 Activity, other specified: Secondary | ICD-10-CM | POA: Insufficient documentation

## 2018-10-25 DIAGNOSIS — Y999 Unspecified external cause status: Secondary | ICD-10-CM | POA: Insufficient documentation

## 2018-10-25 DIAGNOSIS — Z87891 Personal history of nicotine dependence: Secondary | ICD-10-CM | POA: Insufficient documentation

## 2018-10-25 DIAGNOSIS — W2209XA Striking against other stationary object, initial encounter: Secondary | ICD-10-CM | POA: Insufficient documentation

## 2018-10-25 DIAGNOSIS — S61011A Laceration without foreign body of right thumb without damage to nail, initial encounter: Secondary | ICD-10-CM | POA: Insufficient documentation

## 2018-10-25 DIAGNOSIS — Y929 Unspecified place or not applicable: Secondary | ICD-10-CM | POA: Insufficient documentation

## 2018-10-25 MED ORDER — ACETAMINOPHEN 500 MG PO TABS
1000.0000 mg | ORAL_TABLET | Freq: Once | ORAL | Status: AC
  Administered 2018-10-25: 1000 mg via ORAL
  Filled 2018-10-25: qty 2

## 2018-10-25 MED ORDER — BACITRACIN ZINC 500 UNIT/GM EX OINT
TOPICAL_OINTMENT | Freq: Once | CUTANEOUS | Status: AC
  Administered 2018-10-25: 1 via TOPICAL
  Filled 2018-10-25: qty 0.9

## 2018-10-25 MED ORDER — LIDOCAINE HCL (PF) 1 % IJ SOLN
10.0000 mL | Freq: Once | INTRAMUSCULAR | Status: AC
  Administered 2018-10-25: 10 mL via INTRADERMAL
  Filled 2018-10-25: qty 30

## 2018-10-25 NOTE — ED Triage Notes (Signed)
Pt arrived stating that he was upset and punched a flat screen TV. Laceration to right thumb, continues to bleed, rewrapped in triage.

## 2018-10-25 NOTE — Discharge Instructions (Signed)
Keep the wound clean and dry for the first 24 hours. After that you may gently clean the wound with soap and water. Make sure to pat dry the wound before covering it with any dressing. You can use topical antibiotic ointment and bandage. Ice and elevate for pain relief.  ° °You can take Tylenol or Ibuprofen as directed for pain. You can alternate Tylenol and Ibuprofen every 4 hours for additional pain relief.  ° °Return to the Emergency Department, your primary care doctor, or the [facility] in 5-7 days for suture removal.  ° °Monitor closely for any signs of infection. Return to the Emergency Department for any worsening redness/swelling of the area that begins to spread, drainage from the site, worsening pain, fever or any other worsening or concerning symptoms.  ° ° °

## 2018-10-25 NOTE — ED Provider Notes (Signed)
[facility]-EMERGENCY DEPT Provider Note   CSN: [id number] Arrival date & time: 10/25/18  0110    History   Chief Complaint Chief Complaint  Patient presents with   Laceration    Right thumb    HPI Devin KillingsMark [name] is a 38 y.o. male history of ADHD, Asthma, Bipolar disorder, GERD, who presents for evaluation of right thumb laceration that occurred just prior to ED arrival.  Patient reports he got mad and punched a TV.  He reports pain to the thumb after punching.  He states he is not on blood thinners.  His tetanus is up-to-date.  He denies any numbness/weakness.     The history is provided by the patient.    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 Authorizing Provider  albuterol (PROVENTIL) (2.5 MG/3ML) 0.083% nebulizer solution Take 3 mLs (2.5 mg total) by nebulization every 6 (six) hours as needed for wheezing or shortness of breath. 08/25/18  Yes Robinson, [place] N, PA-C  albuterol (VENTOLIN HFA) 108 (90 Base) MCG/ACT inhaler Inhale 2 puffs into the lungs every 6 (six) hours as needed for wheezing or shortness of breath. 08/25/18  Yes  Robinson, [place] N, PA-C  acetaminophen (TYLENOL) 500 MG tablet Take 1 tablet (500 mg total) by mouth every 6 (six) hours as needed. Patient not taking: Reported on 10/25/2018 04/08/18   Emi HolesLaw, Alexandra M, PA-C  benzonatate (TESSALON) 100 MG capsule Take 1 capsule (100 mg total) by mouth 3 (three) times daily as needed for cough. Patient not taking: Reported on 09/25/2018 07/16/18   Tilden Fossaees, Elizabeth, MD  fluticasone [facility](FLOVENT HFA) 44 MCG/ACT inhaler Inhale 2 puffs into the lungs 2 (two) times daily. For asthma Patient not taking: Reported on 10/17/2018 02/09/18   Armandina StammerNwoko, Agnes I, NP  gabapentin (NEURONTIN) 400 MG capsule Take 1 capsule (400 mg total) by mouth 3 (three) times daily. For agitation Patient not taking: Reported on 04/08/2018 02/09/18   Armandina StammerNwoko, Agnes I, NP  hydrOXYzine (ATARAX/VISTARIL) 25 MG tablet Take 1 tablet (25 mg) by mouth three times daily & 2 tablets (50 mg) at bedtime for anxiety/sleep Patient not taking: Reported on 04/08/2018 02/09/18   Armandina StammerNwoko, Agnes I, NP  mirtazapine (REMERON) 30 MG tablet Take 1 tablet (30 mg total) by mouth at bedtime. For depression Patient not taking: Reported on 04/08/2018 02/09/18   Armandina StammerNwoko, Agnes I, NP  OLANZapine (ZYPREXA) 2.5 MG tablet Take 1 tablet (2.5 mg total) by mouth at bedtime. For mood control Patient not taking: Reported on 04/08/2018 02/09/18   Armandina StammerNwoko, Agnes I, NP  ondansetron (ZOFRAN) 4 MG tablet Take 1 tablet (4 mg total) by  mouth every 6 (six) hours. Patient not taking: Reported on 07/14/2018 04/08/18   Emi HolesLaw, Alexandra M, PA-C  pantoprazole (PROTONIX) 40 MG tablet Take 1 tablet (40 mg total) by mouth daily. For acid reflux Patient not taking: Reported on 04/08/2018 02/10/18   Armandina StammerNwoko, Agnes I, NP  predniSONE (DELTASONE) 50 MG tablet Take 1 tablet (50 mg total) by mouth daily. Patient not taking: Reported on 10/25/2018 10/17/18   Charlestine NightLawyer, Christopher, PA-C  promethazine (PHENERGAN) 25 MG tablet Take 1 tablet (25 mg total) by mouth every 6 (six) hours as  needed for nausea. Patient not taking: Reported on 07/14/2018 06/04/18   Benjiman CorePickering, Nathan, MD    Family History Family History  Problem Relation Age of Onset   Coronary artery disease Father    Heart attack Father    Heart disease Father    Heart failure Mother    Hyperlipidemia Mother    Hypertension Mother    Migraines Mother    COPD Mother    Diabetes Maternal Aunt    Asthma Maternal Grandfather    Asthma Paternal Grandfather     Social History Social History   Tobacco Use   Smoking status: Former Smoker    Packs/day: 0.50    Types: Cigarettes    Quit date: 09/01/2018    Years since quitting: 0.1   Smokeless tobacco: Never Used  Substance Use Topics   Alcohol use: Yes    Alcohol/week: 0.0 standard drinks   Drug use: Yes    Types: Marijuana, Cocaine    Comment:                       Allergies   Augmentin [amoxicillin-pot clavulanate] and Other   Review of Systems Review of Systems  Skin: Positive for wound.  Neurological: Negative for weakness and numbness.  All other systems reviewed and are negative.    Physical Exam Updated Vital Signs BP 119/83 (BP Location: Left Arm)    Pulse (!) 107    Temp 98.5 F (36.9 C) (Oral)    Resp 17    Ht 5\' 9"  (1.753 m)    SpO2 100%    BMI 26.58 kg/m   Physical Exam Vitals signs and nursing note reviewed.  Constitutional:      Appearance: He is well-developed.  HENT:     Head: Normocephalic and atraumatic.  Eyes:     General: No scleral icterus.       Right eye: No discharge.        Left eye: No discharge.     Conjunctiva/sclera: Conjunctivae normal.  Cardiovascular:     Pulses:          Radial pulses are 2+ on the right side and 2+ on the left side.  Pulmonary:     Effort: Pulmonary effort is normal.  Musculoskeletal:     Comments: Tenderness palpation noted to the right thumb.  No deformity or crepitus noted.  Flexion/tension of the IP joint intact when held in isolation.  No tenderness  palpation noted to right wrist.  Flexion/tension of right wrist intact any difficulty.  Skin:    General: Skin is warm and dry.     Capillary Refill: Capillary refill takes less than 2 seconds.     Comments: Good distal cap refill. RUE is not dusky in appearance or cool to touch.  Small shaped linear laceration laceration noted to dorsal aspect of the right thumb distal to the IP joint.  Neurological:  Mental Status: He is alert.     Comments: Sensation intact along major nerve distributions of BUE  Psychiatric:        Speech: Speech normal.        Behavior: Behavior normal.      ED Treatments / Results  Labs (all labs ordered are listed, but only abnormal results are displayed) Labs Reviewed - No data to display  EKG None  Radiology Dg Hand Complete Right  Result Date: 10/25/2018 CLINICAL DATA:  Thumb laceration after punching and TV. EXAM: RIGHT HAND - COMPLETE 3+ VIEW COMPARISON:  Right hand x-rays dated Aug 26, 2008. FINDINGS: There is no evidence of fracture or dislocation. There is no evidence of arthropathy or other focal bone abnormality. Soft tissues are unremarkable. No radiopaque foreign body identified. IMPRESSION: Negative. Electronically Signed   By: Obie DredgeWilliam T Derry M.D.   On: 10/25/2018 05:27    Procedures .Marland Kitchen.Laceration Repair  Date/Time: 10/25/2018 5:46 AM Performed by: Maxwell CaulLayden, Fransisco Messmer A, PA-C Authorized by: Maxwell CaulLayden, Tomica Arseneault A, PA-C   Consent:    Consent obtained:  Verbal   Consent given by:  Patient   Risks discussed:  Infection, need for additional repair, pain, poor cosmetic result and poor wound healing   Alternatives discussed:  No treatment and delayed treatment Universal protocol:    Procedure explained and questions answered to patient or proxy's satisfaction: yes     Relevant documents present and verified: yes     Test results available and properly labeled: yes     Imaging studies available: yes     Required blood products, implants, devices, and  special equipment available: yes     Site/side marked: yes     Immediately prior to procedure, a time out was called: yes     Patient identity confirmed:  Verbally with patient Anesthesia (see MAR for exact dosages):    Anesthesia method:  Local infiltration   Local anesthetic:  Lidocaine 1% w/o epi Laceration details:    Location:  Finger   Finger location:  R thumb   Length (cm):  1 Repair type:    Repair type:  Simple Pre-procedure details:    Preparation:  Patient was prepped and draped in usual sterile fashion Exploration:    Hemostasis achieved with:  Direct pressure   Wound exploration: wound explored through full range of motion     Wound extent: no foreign bodies/material noted   Treatment:    Area cleansed with:  Betadine   Amount of cleaning:  Extensive   Irrigation solution:  Sterile saline   Irrigation method:  Syringe   Visualized foreign bodies/material removed: no   Skin repair:    Repair method:  Sutures   Suture size:  4-0   Suture material:  Nylon   Suture technique:  Simple interrupted   Number of sutures:  3 Approximation:    Approximation:  Close Post-procedure details:    Dressing:  Antibiotic ointment and non-adherent dressing   Patient tolerance of procedure:  Tolerated well, no immediate complications Comments:     Once the wound was anesthetized, it was thoroughly extensively irrigated with sterile saline.  Evaluation of the wound showed that was initially thought to be a V-shaped laceration only ended up being a  linear 1 cm laceration.  The other side the laceration was mainly an abrasion.  There is no evidence of any foreign body.  Wound was explored with no evidence of tendon damage.  He was able to flex and extend the IP  joint without any difficulty.  The wound was thoroughly extensively irrigated again prior to closing.   (including critical care time)  Medications Ordered in ED Medications  acetaminophen (TYLENOL) tablet 1,000 mg (has no  administration in time range)  bacitracin ointment (has no administration in time range)  lidocaine (PF) (XYLOCAINE) 1 % injection 10 mL (10 mLs Intradermal Given 10/25/18 0408)     Initial Impression / Assessment and Plan / ED Course  I have reviewed the triage vital signs and the nursing notes.  Pertinent labs & imaging results that were available during my care of the patient were reviewed by me and considered in my medical decision making (see chart for details).        38 year old male who presents for evaluation of right thumb laceration.  Reports that he punched a TV and sustained a laceration dorsal aspect of his thumb.  Tetanus is up-to-date. Patient is afebrile, non-toxic appearing, sitting comfortably on examination table. Vital signs reviewed and stable. Patient is neurovascularly intact.  Patient is reporting diffuse hand pain after punching wall.  Will plan for x-ray for evaluation of any bony abnormality.  XRs reviewed.  Negative for any fracture.  Laceration repaired as documented above.  Patient tolerated procedure well. At this time, patient exhibits no emergent life-threatening condition that require further evaluation in ED. Patient had ample opportunity for questions and discussion. All patient's questions were answered with full understanding. Strict return precautions discussed. Patient expresses understanding and agreement to plan.   Portions of this note were generated with Lobby[employer]. Dictation errors may occur despite best attempts at proofreading.   Final Clinical Impressions(s) / ED Diagnoses   Final diagnoses:  Laceration of right thumb without foreign body without damage to nail, initial encounter    ED Discharge Orders    None       Desma Mcgregor 10/25/18 0606    Palumbo, April, MD 10/25/18 [phone]

## 2018-10-26 ENCOUNTER — Emergency Department (HOSPITAL_COMMUNITY)
Admission: EM | Admit: 2018-10-26 | Discharge: 2018-10-26 | Disposition: A | Discharge status: Home / Self Care | Payer: Self-pay | Attending: Emergency Medicine | Admitting: Emergency Medicine

## 2018-10-26 ENCOUNTER — Other Ambulatory Visit: Payer: Self-pay

## 2018-10-26 DIAGNOSIS — J45909 Unspecified asthma, uncomplicated: Secondary | ICD-10-CM | POA: Insufficient documentation

## 2018-10-26 DIAGNOSIS — M79644 Pain in right finger(s): Secondary | ICD-10-CM | POA: Insufficient documentation

## 2018-10-26 DIAGNOSIS — J449 Chronic obstructive pulmonary disease, unspecified: Secondary | ICD-10-CM | POA: Insufficient documentation

## 2018-10-26 DIAGNOSIS — J4521 Mild intermittent asthma with (acute) exacerbation: Secondary | ICD-10-CM | POA: Insufficient documentation

## 2018-10-26 DIAGNOSIS — Z87891 Personal history of nicotine dependence: Secondary | ICD-10-CM | POA: Insufficient documentation

## 2018-10-26 MED ORDER — ALBUTEROL SULFATE HFA 108 (90 BASE) MCG/ACT IN AERS
2.0000 | INHALATION_SPRAY | Freq: Once | RESPIRATORY_TRACT | Status: AC
  Administered 2018-10-26: 11:00:00 2 via RESPIRATORY_TRACT
  Filled 2018-10-26: qty 6.7

## 2018-10-26 MED ORDER — ALBUTEROL SULFATE HFA 108 (90 BASE) MCG/ACT IN AERS: 2.0000 | INHALATION_SPRAY | Freq: Four times a day (QID) | RESPIRATORY_TRACT | 2 refills | Status: DC | PRN

## 2018-10-26 MED ORDER — IBUPROFEN 400 MG PO TABS
600.0000 mg | ORAL_TABLET | Freq: Once | ORAL | Status: AC
  Administered 2018-10-26: 600 mg via ORAL
  Filled 2018-10-26: qty 1

## 2018-10-26 MED ORDER — PREDNISONE 10 MG (21) PO TBPK: ORAL_TABLET | ORAL | 0 refills | Status: DC

## 2018-10-26 NOTE — ED Notes (Signed)
Patient verbalizes understanding of discharge instructions. Opportunity for questioning and answering were provided. Armband removed by staff , patient discharged from ED. 

## 2018-10-26 NOTE — Discharge Instructions (Signed)
Antiinflammatory medications: Take 600 mg of ibuprofen every 6 hours or 440 mg (over the counter dose) to 500 mg (prescription dose) of naproxen every 12 hours for the next 3 days. After this time, these medications may be used as needed for pain. Take these medications with food to avoid upset stomach. Choose only one of these medications, do not take them together. Acetaminophen (generic for Tylenol): Should you continue to have additional pain while taking the ibuprofen or naproxen, you may add in acetaminophen as needed. Your daily total maximum amount of acetaminophen from all sources should be limited to 4000mg /day for persons without liver problems, or 2000mg /day for those with liver problems.  Follow up with a primary care provider for your asthma.  Return to the ED, as needed.

## 2018-10-26 NOTE — ED Triage Notes (Signed)
Pt states that he had stitches put in to the right thumb yesterday and was discharged without any pain meds and it has been hurting ; pt also states his asthma is flaring up in the last 24 hours since he ran out of his inhaler

## 2018-10-26 NOTE — ED Provider Notes (Signed)
[facility] Provider Note   CSN: [id number] Arrival date & time: 10/26/18  [id number]     History   Chief Complaint Chief Complaint  Patient presents with  . Finger Injury    HPI Devin [name] is a 38 y.o. male.     HPI   Devin [name] is a 38 y.o. male, with a history of asthma, bipolar, GERD, presenting to the ED with 2 complaints. First, patient states he had a laceration to the right thumb sutured at [facility], ED yesterday.  He endorses throbbing pain to the thumb, moderate to severe, radiating toward the hand.  He has not taken any medications or tried any therapies for this.  He has not been elevating his hand. Denies numbness, swelling, fever, weakness, spreading redness.  Also notes some wheezing that began last night.  States he feels as though it is his asthma.  He has run out of his inhaler recently.  Mild shortness of breath.  Denies cough, chest pain, vomiting, diarrhea, sore throat, lower extremity edema/pain, or any other complaints.    Past Medical History:  Diagnosis Date  . ADHD (attention deficit hyperactivity disorder)   . Asthma   . Bipolar 1 disorder (HCC)   . GERD (gastroesophageal reflux disease)   . Transaminitis 07/17/2015    Patient Active Problem List   Diagnosis Date Noted  . Substance induced mood disorder (HCC) 03/07/2018  . Depression   . 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 Bipolar II disorder (HCC) 01/31/2018  . Hypoxia   . COPD exacerbation (HCC)   . Leukocytosis 11/04/2015  . Transaminitis 07/17/2015  . Tooth ache 07/17/2015  . Asthma with status asthmaticus 10/14/2011  . ADHD (attention deficit hyperactivity disorder) 10/14/2011    Past Surgical History:  Procedure Laterality Date  . TYMPANOPLASTY Left 1997        Home Medications    Prior to Admission medications   Medication Sig Start Date End Date Taking? Authorizing Provider   acetaminophen (TYLENOL) 500 MG tablet Take 1 tablet (500 mg total) by mouth every 6 (six) hours as needed. Patient not taking: Reported on 10/25/2018 04/08/18   Emi HolesLaw, Alexandra M, PA-C  albuterol (PROVENTIL) (2.5 MG/3ML) 0.083% nebulizer solution Take 3 mLs (2.5 mg total) by nebulization every 6 (six) hours as needed for wheezing or shortness of breath. 08/25/18   Robinson, [place] N, PA-C  albuterol (VENTOLIN HFA) 108 (90 Base) MCG/ACT inhaler Inhale 2 puffs into the lungs every 6 (six) hours as needed for wheezing or shortness of breath. 10/26/18   Shraddha Lebron C, PA-C  benzonatate (TESSALON) 100 MG capsule Take 1 capsule (100 mg total) by mouth 3 (three) times daily as needed for cough. Patient not taking: Reported on 09/25/2018 07/16/18   Tilden Fossaees, Elizabeth, MD  fluticasone [facility](FLOVENT HFA) 44 MCG/ACT inhaler Inhale 2 puffs into the lungs 2 (two) times daily. For asthma Patient not taking: Reported on 10/17/2018 02/09/18   Armandina StammerNwoko, Agnes I, NP  gabapentin (NEURONTIN) 400 MG capsule Take 1 capsule (400 mg total) by mouth 3 (three) times daily. For agitation Patient not taking: Reported on 04/08/2018 02/09/18   Armandina StammerNwoko, Agnes I, NP  hydrOXYzine (ATARAX/VISTARIL) 25 MG tablet Take 1 tablet (25 mg) by mouth three times daily & 2 tablets (50 mg) at bedtime for anxiety/sleep Patient not taking: Reported on 04/08/2018 02/09/18   Armandina StammerNwoko, Agnes I, NP  mirtazapine (REMERON) 30 MG tablet Take 1 tablet (30 mg total)  by mouth at bedtime. For depression Patient not taking: Reported on 04/08/2018 02/09/18   Lindell Spar I, NP  OLANZapine (ZYPREXA) 2.5 MG tablet Take 1 tablet (2.5 mg total) by mouth at bedtime. For mood control Patient not taking: Reported on 04/08/2018 02/09/18   Lindell Spar I, NP  ondansetron (ZOFRAN) 4 MG tablet Take 1 tablet (4 mg total) by mouth every 6 (six) hours. Patient not taking: Reported on 07/14/2018 04/08/18   Frederica Kuster, PA-C  pantoprazole (PROTONIX) 40 MG tablet Take 1 tablet (40 mg total) by  mouth daily. For acid reflux Patient not taking: Reported on 04/08/2018 02/10/18   Lindell Spar I, NP  predniSONE (STERAPRED UNI-PAK 21 TAB) 10 MG (21) TBPK tablet Take 6 tabs (60mg ) day 1, 5 tabs (50mg ) day 2, 4 tabs (40mg ) day 3, 3 tabs (30mg ) day 4, 2 tabs (20mg ) day 5, and 1 tab (10mg ) day 6. 10/26/18   Jabori Henegar C, PA-C  promethazine (PHENERGAN) 25 MG tablet Take 1 tablet (25 mg total) by mouth every 6 (six) hours as needed for nausea. Patient not taking: Reported on 07/14/2018 06/04/18   Davonna Belling, MD    Family History Family History  Problem Relation Age of Onset  . Coronary artery disease Father   . Heart attack Father   . Heart disease Father   . Heart failure Mother   . Hyperlipidemia Mother   . Hypertension Mother   . Migraines Mother   . COPD Mother   . Diabetes Maternal Aunt   . Asthma Maternal Grandfather   . Asthma Paternal Grandfather     Social History Social History   Tobacco Use  . Smoking status: Former Smoker    Packs/day: 0.50    Types: Cigarettes    Quit date: 09/01/2018    Years since quitting: 0.1  . Smokeless tobacco: Never Used  Substance Use Topics  . Alcohol use: Yes    Alcohol/week: 0.0 standard drinks  . Drug use: Yes    Types: Marijuana, Cocaine    Comment:                       Allergies   Augmentin [amoxicillin-pot clavulanate] and Other   Review of Systems Review of Systems  Constitutional: Negative for fever.  Respiratory: Positive for shortness of breath. Negative for cough.   Cardiovascular: Negative for chest pain and leg swelling.  Gastrointestinal: Negative for abdominal pain, diarrhea, nausea and vomiting.  Musculoskeletal: Positive for arthralgias.  Skin: Positive for wound.  Neurological: Negative for weakness and numbness.  All other systems reviewed and are negative.    Physical Exam Updated Vital Signs BP (!) 145/98   Pulse 72   Temp 97.8 F (36.6 C) (Oral)   Resp 18   SpO2 97%   Physical Exam  Vitals signs and nursing note reviewed.  Constitutional:      General: He is not in acute distress.    Appearance: He is well-developed. He is not diaphoretic.  HENT:     Head: Normocephalic and atraumatic.     Mouth/Throat:     Mouth: Mucous membranes are moist.     Pharynx: Oropharynx is clear.  Eyes:     Conjunctiva/sclera: Conjunctivae normal.  Neck:     Musculoskeletal: Neck supple.  Cardiovascular:     Rate and Rhythm: Normal rate and regular rhythm.     Pulses: Normal pulses.          Radial pulses are 2+  on the right side and 2+ on the left side.       Posterior tibial pulses are 2+ on the right side and 2+ on the left side.     Heart sounds: Normal heart sounds.     Comments: Tactile temperature in the extremities appropriate and equal bilaterally. Pulmonary:     Effort: Pulmonary effort is normal. No respiratory distress.     Breath sounds: Wheezing (global, mild) present.     Comments: No increased work of breathing.  Speaks in full sentences without difficulty. Abdominal:     Tenderness: There is no guarding.  Musculoskeletal:     Right lower leg: No edema.     Left lower leg: No edema.     Comments: 3 sutures present to a laceration on the dorsal surface of the right thumb.  No evidence of dehiscence.  No swelling, erythema, or other noted abnormality.  Patient has localized tenderness. He does have pain with movement of the IP joint, as expected. No pain, tenderness, swelling, or color change to the right hand or wrist.  Lymphadenopathy:     Cervical: No cervical adenopathy.  Skin:    General: Skin is warm and dry.  Neurological:     Mental Status: He is alert.     Comments: Sensation to light touch grossly intact in the fingers and thumb of the right hand. Motor function intact in each of these fingers.  Psychiatric:        Mood and Affect: Mood and affect normal.        Speech: Speech normal.        Behavior: Behavior normal.      ED Treatments /  Results  Labs (all labs ordered are listed, but only abnormal results are displayed) Labs Reviewed - No data to display  EKG None  Radiology Dg Hand Complete Right  Result Date: 10/25/2018 CLINICAL DATA:  Thumb laceration after punching and TV. EXAM: RIGHT HAND - COMPLETE 3+ VIEW COMPARISON:  Right hand x-rays dated Aug 26, 2008. FINDINGS: There is no evidence of fracture or dislocation. There is no evidence of arthropathy or other focal bone abnormality. Soft tissues are unremarkable. No radiopaque foreign body identified. IMPRESSION: Negative. Electronically Signed   By: Obie DredgeWilliam T Derry M.D.   On: 10/25/2018 05:27    Procedures Procedures (including critical care time)  Medications Ordered in ED Medications  ibuprofen (ADVIL) tablet 600 mg (600 mg Oral Given 10/26/18 1046)  albuterol (VENTOLIN HFA) 108 (90 Base) MCG/ACT inhaler 2 puff (2 puffs Inhalation Given 10/26/18 1046)     Initial Impression / Assessment and Plan / ED Course  I have reviewed the triage vital signs and the nursing notes.  Pertinent labs & imaging results that were available during my care of the patient were reviewed by me and considered in my medical decision making (see chart for details).        Patient presents with complaint of pain to his sutured right thumb.  He tells me he sustained this injury while cutting food for his children.  He does not have evidence of neurovascular dysfunction.  No signs of cellulitis or other infection at this time.  I have reiterated the instructions for pain management given to him at discharge yesterday. As per the patient's wheezing.  His wheezing is minor.  He has no increased work of breathing.  Excellent SPO2 on room air.  We have provided him with an inhaler here in the ED as well as  a prescription for refills.  He states he is currently working on establishing care with community health and wellness. The patient was given instructions for home care as well as return  precautions. Patient voices understanding of these instructions, accepts the plan, and is comfortable with discharge.  Final Clinical Impressions(s) / ED Diagnoses   Final diagnoses:  Finger pain, right  Mild intermittent asthma with exacerbation    ED Discharge Orders         Ordered    predniSONE (STERAPRED UNI-PAK 21 TAB) 10 MG (21) TBPK tablet     10/26/18 1049    albuterol (VENTOLIN HFA) 108 (90 Base) MCG/ACT inhaler  Every 6 hours PRN     10/26/18 1049           Yzabella Crunk, Hillard DankerShawn C, PA-C 10/26/18 1156    Pricilla LovelessGoldston, Scott, MD 10/26/18 1355

## 2018-10-28 ENCOUNTER — Other Ambulatory Visit: Payer: Self-pay

## 2018-10-28 ENCOUNTER — Emergency Department (HOSPITAL_COMMUNITY)
Admission: EM | Admit: 2018-10-28 | Discharge: 2018-10-28 | Disposition: A | Discharge status: Home / Self Care | Payer: Self-pay | Attending: Emergency Medicine | Admitting: Emergency Medicine

## 2018-10-28 ENCOUNTER — Encounter (HOSPITAL_COMMUNITY): Payer: Self-pay | Admitting: *Deleted

## 2018-10-28 ENCOUNTER — Emergency Department (HOSPITAL_COMMUNITY): Payer: Self-pay

## 2018-10-28 DIAGNOSIS — J45909 Unspecified asthma, uncomplicated: Secondary | ICD-10-CM | POA: Insufficient documentation

## 2018-10-28 DIAGNOSIS — R197 Diarrhea, unspecified: Secondary | ICD-10-CM

## 2018-10-28 DIAGNOSIS — R112 Nausea with vomiting, unspecified: Secondary | ICD-10-CM | POA: Insufficient documentation

## 2018-10-28 DIAGNOSIS — Z79899 Other long term (current) drug therapy: Secondary | ICD-10-CM | POA: Insufficient documentation

## 2018-10-28 DIAGNOSIS — Z87891 Personal history of nicotine dependence: Secondary | ICD-10-CM | POA: Insufficient documentation

## 2018-10-28 DIAGNOSIS — R1011 Right upper quadrant pain: Secondary | ICD-10-CM | POA: Insufficient documentation

## 2018-10-28 LAB — COMPREHENSIVE METABOLIC PANEL
ALT: 29 U/L (ref 0–44)
AST: 33 U/L (ref 15–41)
Albumin: 4.3 g/dL (ref 3.5–5.0)
Alkaline Phosphatase: 56 U/L (ref 38–126)
Anion gap: 12 (ref 5–15)
BUN: 11 mg/dL (ref 6–20)
CO2: 21 mmol/L — ABNORMAL LOW (ref 22–32)
Calcium: 9.1 mg/dL (ref 8.9–10.3)
Chloride: 105 mmol/L (ref 98–111)
Creatinine, Ser: 0.8 mg/dL (ref 0.61–1.24)
GFR calc Af Amer: >60 mL/min (ref >60)
GFR calc non Af Amer: >60 mL/min (ref >60)
Glucose, Bld: 78 mg/dL (ref 70–99)
Potassium: 3.6 mmol/L (ref 3.5–5.1)
Sodium: 138 mmol/L (ref 135–145)
Total Bilirubin: 1.1 mg/dL (ref 0.3–1.2)
Total Protein: 7.7 g/dL (ref 6.5–8.1)

## 2018-10-28 LAB — CBC
HCT: 44.6 % (ref 39.0–52.0)
Hemoglobin: 15.1 g/dL (ref 13.0–17.0)
MCH: 31.3 pg (ref 26.0–34.0)
MCHC: 33.9 g/dL (ref 30.0–36.0)
MCV: 92.5 fL (ref 80.0–100.0)
Platelets: 371 10*3/uL (ref 150–400)
RBC: 4.82 MIL/uL (ref 4.22–5.81)
RDW: 12.3 % (ref 11.5–15.5)
WBC: 13.3 10*3/uL — ABNORMAL HIGH (ref 4.0–10.5)
nRBC: 0 % (ref 0.0–0.2)

## 2018-10-28 LAB — LIPASE, BLOOD: Lipase: 22 U/L (ref 11–51)

## 2018-10-28 MED ORDER — ONDANSETRON HCL 4 MG PO TABS: 4.0000 mg | ORAL_TABLET | Freq: Four times a day (QID) | ORAL | 0 refills | Status: DC | PRN

## 2018-10-28 MED ORDER — ONDANSETRON HCL 4 MG/2ML IJ SOLN
4.0000 mg | Freq: Once | INTRAMUSCULAR | Status: AC
  Administered 2018-10-28: 18:00:00 4 mg via INTRAVENOUS
  Filled 2018-10-28: qty 2

## 2018-10-28 MED ORDER — SODIUM CHLORIDE (PF) 0.9 % IJ SOLN
INTRAMUSCULAR | Status: AC
  Filled 2018-10-28: qty 50

## 2018-10-28 MED ORDER — IOHEXOL 300 MG/ML  SOLN
100.0000 mL | Freq: Once | INTRAMUSCULAR | Status: AC | PRN
  Administered 2018-10-28: 100 mL via INTRAVENOUS

## 2018-10-28 MED ORDER — PANTOPRAZOLE SODIUM 40 MG PO TBEC: 40.0000 mg | DELAYED_RELEASE_TABLET | Freq: Every day | ORAL | 0 refills | Status: DC

## 2018-10-28 MED ORDER — SODIUM CHLORIDE 0.9% FLUSH: 3.0000 mL | Freq: Once | INTRAVENOUS | Status: DC

## 2018-10-28 MED ORDER — MORPHINE SULFATE (PF) 4 MG/ML IV SOLN
4.0000 mg | Freq: Once | INTRAVENOUS | Status: AC
  Administered 2018-10-28: 4 mg via INTRAVENOUS
  Filled 2018-10-28: qty 1

## 2018-10-28 MED ORDER — SODIUM CHLORIDE 0.9 % IV BOLUS
1000.0000 mL | Freq: Once | INTRAVENOUS | Status: AC
  Administered 2018-10-28: 18:00:00 1000 mL via INTRAVENOUS

## 2018-10-28 NOTE — ED Notes (Signed)
Patient transported to CT 

## 2018-10-28 NOTE — Discharge Instructions (Signed)
You were seen in the emergency department for some right-sided abdominal pain along with nausea vomiting and diarrhea.  You had blood work and a CAT scan that did not show a serious cause of your symptoms.  There was no evidence of any gallstones.  On the CAT scan they did note a pulmonary nodule that will need to be followed up with your doctor.  Included below was the report.  We are sending you home with some nausea medication.  IMPRESSION:  1. No acute CT findings of the abdomen or pelvis to explain  right-sided abdominal pain, nausea, vomiting, or diarrhea. Normal  appendix.     2. Redemonstrated tiny pulmonary nodules of the bilateral lung  bases, for example a 3 mm nodule of the right lower lobe (series 4,  image 22). As on prior examination, CT follow-up of these nodules is  optional at 1 year if there are high risk factors for lung cancer  such as cigarette smoking

## 2018-10-28 NOTE — ED Triage Notes (Signed)
Per EMS, pt from home complains of right sided abdominal pain, n/v/d since this morning. Pt received 4 mg zofran en route.  BP 154/98 O2 100% HR 76 CBG 90 Temp 97

## 2018-10-28 NOTE — ED Provider Notes (Signed)
[facility]-EMERGENCY DEPT Provider Note   CSN: [id number] Arrival date & time: 10/28/18  1449     History   Chief Complaint Chief Complaint  Patient presents with   Abdominal Pain    HPI Devin [name] is a 38 y.o. male.  He is complaining of acute right upper quadrant abdominal pain severe this morning associated with multiple episodes of vomiting and diarrhea.  There was no blood from either end.  No fevers or chills.  York SpanielSaid has been unable to hold down any fluids.  He is never had this before.  No trauma no sick contacts no fever no cough no shortness of breath.     The history is provided by the patient.  Abdominal Pain Pain location:  RUQ and RLQ Pain quality: stabbing   Pain radiates to:  Does not radiate Pain severity:  Severe Onset quality:  Sudden Timing:  Constant Progression:  Unchanged Chronicity:  New Context: not recent travel, not sick contacts and not trauma   Relieved by:  None tried Worsened by:  Nothing Ineffective treatments:  None tried Associated symptoms: diarrhea, nausea and vomiting   Associated symptoms: no chest pain, no constipation, no cough, no dysuria, no fever, no hematemesis, no hematochezia, no hematuria, no melena, no shortness of breath and no sore throat     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 Authorizing Provider  acetaminophen (TYLENOL) 500 MG tablet Take 1 tablet (500 mg total) by mouth every 6 (six) hours as needed. Patient not taking: Reported on 10/25/2018 04/08/18   Emi HolesLaw, Alexandra M, PA-C  albuterol (PROVENTIL) (2.5 MG/3ML) 0.083% nebulizer solution Take 3 mLs (2.5 mg total) by nebulization every 6 (six) hours as needed for wheezing or shortness of breath. 08/25/18   Robinson, [place] N, PA-C  albuterol (VENTOLIN HFA) 108 (90 Base) MCG/ACT inhaler Inhale 2 puffs into the lungs every 6 (six) hours as needed for wheezing or shortness of breath. 10/26/18   Joy, Shawn C, PA-C  benzonatate (TESSALON) 100 MG capsule Take 1 capsule (100 mg total) by mouth 3 (three) times daily as needed for cough. Patient not taking: Reported on 09/25/2018 07/16/18   Tilden Fossaees, Elizabeth, MD  fluticasone [facility](FLOVENT HFA) 44 MCG/ACT inhaler Inhale 2 puffs into the lungs 2 (two) times daily. For asthma Patient not taking: Reported on 10/17/2018 02/09/18   Armandina StammerNwoko, Agnes I, NP  gabapentin (NEURONTIN) 400 MG capsule Take 1 capsule (400 mg total) by mouth 3 (three) times daily. For agitation Patient not taking: Reported on 04/08/2018 02/09/18   Armandina StammerNwoko, Agnes I, NP  hydrOXYzine (ATARAX/VISTARIL) 25 MG tablet Take 1 tablet (  25 mg) by mouth three times daily & 2 tablets (50 mg) at bedtime for anxiety/sleep Patient not taking: Reported on 04/08/2018 02/09/18   Armandina StammerNwoko, Agnes I, NP  mirtazapine (REMERON) 30 MG tablet Take 1 tablet (30 mg total) by mouth at bedtime. For depression Patient not taking: Reported on 04/08/2018 02/09/18   Armandina StammerNwoko, Agnes I, NP  OLANZapine (ZYPREXA) 2.5 MG tablet Take 1 tablet (2.5 mg total) by mouth at bedtime. For mood control Patient not taking: Reported on 04/08/2018 02/09/18   Armandina StammerNwoko, Agnes I, NP    ondansetron (ZOFRAN) 4 MG tablet Take 1 tablet (4 mg total) by mouth every 6 (six) hours. Patient not taking: Reported on 07/14/2018 04/08/18   Emi HolesLaw, Alexandra M, PA-C  pantoprazole (PROTONIX) 40 MG tablet Take 1 tablet (40 mg total) by mouth daily. For acid reflux Patient not taking: Reported on 04/08/2018 02/10/18   Armandina StammerNwoko, Agnes I, NP  predniSONE (STERAPRED UNI-PAK 21 TAB) 10 MG (21) TBPK tablet Take 6 tabs (60mg ) day 1, 5 tabs (50mg ) day 2, 4 tabs (40mg ) day 3, 3 tabs (30mg ) day 4, 2 tabs (20mg ) day 5, and 1 tab (10mg ) day 6. 10/26/18   Joy, Shawn C, PA-C  promethazine (PHENERGAN) 25 MG tablet Take 1 tablet (25 mg total) by mouth every 6 (six) hours as needed for nausea. Patient not taking: Reported on 07/14/2018 06/04/18   Benjiman CorePickering, Nathan, MD    Family History Family History  Problem Relation Age of Onset   Coronary artery disease Father    Heart attack Father    Heart disease Father    Heart failure Mother    Hyperlipidemia Mother    Hypertension Mother    Migraines Mother    COPD Mother    Diabetes Maternal Aunt    Asthma Maternal Grandfather    Asthma Paternal Grandfather     Social History Social History   Tobacco Use   Smoking status: Former Smoker    Packs/day: 0.50    Types: Cigarettes    Quit date: 09/01/2018    Years since quitting: 0.1   Smokeless tobacco: Never Used  Substance Use Topics   Alcohol use: Yes    Alcohol/week: 0.0 standard drinks   Drug use: Yes    Types: Marijuana, Cocaine    Comment:                       Allergies   Augmentin [amoxicillin-pot clavulanate] and Other   Review of Systems Review of Systems  Constitutional: Negative for fever.  HENT: Negative for sore throat.   Eyes: Negative for visual disturbance.  Respiratory: Negative for cough and shortness of breath.   Cardiovascular: Negative for chest pain.  Gastrointestinal: Positive for abdominal pain, diarrhea, nausea and vomiting. Negative for constipation,  hematemesis, hematochezia and melena.  Genitourinary: Negative for dysuria and hematuria.  Musculoskeletal: Negative for back pain.  Skin: Negative for rash.  Neurological: Negative for headaches.     Physical Exam Updated Vital Signs BP (!) 138/94 (BP Location: Right Arm)    Pulse 67    Temp 98.4 F (36.9 C) (Oral)    Resp 20    SpO2 98%   Physical Exam Vitals signs and nursing note reviewed.  Constitutional:      Appearance: He is well-developed.  HENT:     Head: Normocephalic and atraumatic.  Eyes:     Conjunctiva/sclera: Conjunctivae normal.  Neck:     Musculoskeletal: Neck supple.  Cardiovascular:  Rate and Rhythm: Normal rate and regular rhythm.     Heart sounds: No murmur.  Pulmonary:     Effort: Pulmonary effort is normal. No respiratory distress.     Breath sounds: Normal breath sounds.  Abdominal:     Palpations: Abdomen is soft.     Tenderness: There is abdominal tenderness in the right upper quadrant and right lower quadrant. There is no guarding or rebound.  Musculoskeletal: Normal range of motion.     Right lower leg: No edema.     Left lower leg: No edema.  Skin:    General: Skin is warm and dry.     Capillary Refill: Capillary refill takes less than 2 seconds.  Neurological:     General: No focal deficit present.     Mental Status: He is alert and oriented to person, place, and time.      ED Treatments / Results  Labs (all labs ordered are listed, but only abnormal results are displayed) Labs Reviewed  COMPREHENSIVE METABOLIC PANEL - Abnormal; Notable for the following components:      Result Value   CO2 21 (*)    All other components within normal limits  CBC - Abnormal; Notable for the following components:   WBC 13.3 (*)    All other components within normal limits  LIPASE, BLOOD  URINALYSIS, ROUTINE W REFLEX MICROSCOPIC    EKG None  Radiology Ct Abdomen Pelvis W Contrast  Result Date: 10/28/2018 CLINICAL DATA:  Right-sided  abdominal pain, nausea, vomiting, diarrhea EXAM: CT ABDOMEN AND PELVIS WITH CONTRAST TECHNIQUE: Multidetector CT imaging of the abdomen and pelvis was performed using the standard protocol following bolus administration of intravenous contrast. CONTRAST:  100mL OMNIPAQUE IOHEXOL 300 MG/ML  SOLN COMPARISON:  09/25/2018 FINDINGS: Lower chest: Redemonstrated tiny pulmonary nodules of the bilateral lung bases, for example a 3 mm nodule of the right lower lobe (series 4, image 22). Hepatobiliary: No solid liver abnormality is seen. No gallstones, gallbladder wall thickening, or biliary dilatation. Pancreas: Unremarkable. No pancreatic ductal dilatation or surrounding inflammatory changes. Spleen: Normal in size without significant abnormality. Adrenals/Urinary Tract: Adrenal glands are unremarkable. Kidneys are normal, without renal calculi, solid lesion, or hydronephrosis. Bladder is unremarkable. Stomach/Bowel: Stomach is within normal limits. Appendix appears normal. No evidence of bowel wall thickening, distention, or inflammatory changes. Vascular/Lymphatic: No significant vascular findings are present. No enlarged abdominal or pelvic lymph nodes. Reproductive: No mass or other significant abnormality. Other: No abdominal wall hernia or abnormality. No abdominopelvic ascites. Musculoskeletal: No acute or significant osseous findings. IMPRESSION: 1. No acute CT findings of the abdomen or pelvis to explain right-sided abdominal pain, nausea, vomiting, or diarrhea. Normal appendix. 2. Redemonstrated tiny pulmonary nodules of the bilateral lung bases, for example a 3 mm nodule of the right lower lobe (series 4, image 22). As on prior examination, CT follow-up of these nodules is optional at 1 year if there are high risk factors for lung cancer such as cigarette smoking. Electronically Signed   By: Lauralyn PrimesAlex  Bibbey M.D.   On: 10/28/2018 18:52    Procedures Procedures (including critical care time)  Medications Ordered  in ED Medications  sodium chloride flush (NS) 0.9 % injection 3 mL (has no administration in time range)  morphine 4 MG/ML injection 4 mg (has no administration in time range)  ondansetron (ZOFRAN) injection 4 mg (has no administration in time range)  sodium chloride 0.9 % bolus 1,000 mL (has no administration in time range)  Initial Impression / Assessment and Plan / ED Course  I have reviewed the triage vital signs and the nursing notes.  Pertinent labs & imaging results that were available during my care of the patient were reviewed by me and considered in my medical decision making (see chart for details).  Clinical Course as of Oct 28 2135  Wed Oct 28, 2018  1705 Kacee Koren was evaluated in Emergency Department on 10/28/2018 for the symptoms described in the history of present illness. He was evaluated in the context of the global COVID-19 pandemic, which necessitated consideration that the patient might be at risk for infection with the SARS-CoV-2 virus that causes COVID-19. Institutional protocols and algorithms that pertain to the evaluation of patients at risk for COVID-19 are in a state of rapid change based on information released by regulatory bodies including the CDC and federal and state organizations. These policies and algorithms were followed during the patient's care in the ED.     [MB]  1706 Differential diagnosis includes cholecystitis, cholelithiasis, peptic ulcer disease, hepatitis, a GE, kidney stone   [MB]  1904 Patient's labs and CAT scan are fairly unremarkable other than a slightly elevated white count of 13.  No evidence of gallstones or obstruction.  I reviewed this with the patient and told him and send him home with some nausea medicine and acid medication.  He is comfortable with plan and will return if any worsening symptoms.   [MB]    Clinical Course User Index [MB] Hayden Rasmussen, MD        Final Clinical Impressions(s) / ED Diagnoses   Final  diagnoses:  Right upper quadrant abdominal pain  Nausea vomiting and diarrhea    ED Discharge Orders         Ordered    pantoprazole (PROTONIX) 40 MG tablet  Daily     10/28/18 1907    ondansetron (ZOFRAN) 4 MG tablet  Every 6 hours PRN     10/28/18 1907           Hayden Rasmussen, MD 10/28/18 2138

## 2020-01-06 ENCOUNTER — Encounter (HOSPITAL_COMMUNITY): Payer: Self-pay

## 2020-01-06 ENCOUNTER — Emergency Department (HOSPITAL_COMMUNITY)
Admission: EM | Admit: 2020-01-06 | Discharge: 2020-01-07 | Disposition: A | Discharge status: Home / Self Care | Payer: Self-pay | Attending: Emergency Medicine | Admitting: Emergency Medicine

## 2020-01-06 DIAGNOSIS — J45909 Unspecified asthma, uncomplicated: Secondary | ICD-10-CM | POA: Insufficient documentation

## 2020-01-06 DIAGNOSIS — R0602 Shortness of breath: Secondary | ICD-10-CM | POA: Insufficient documentation

## 2020-01-06 DIAGNOSIS — Z5321 Procedure and treatment not carried out due to patient leaving prior to being seen by health care provider: Secondary | ICD-10-CM | POA: Insufficient documentation

## 2020-01-06 NOTE — ED Notes (Signed)
Called for Pt to recheck vitals. No answer 

## 2020-01-06 NOTE — ED Triage Notes (Signed)
Pt comes via [facility] after he has been walking a long distance and ETOH use, pt has asthma and used his inhaler without relief, lung sounds clear.

## 2020-01-10 ENCOUNTER — Encounter (HOSPITAL_COMMUNITY): Payer: Self-pay | Admitting: *Deleted

## 2020-01-10 ENCOUNTER — Other Ambulatory Visit: Payer: Self-pay

## 2020-01-10 ENCOUNTER — Emergency Department (HOSPITAL_COMMUNITY)
Admission: EM | Admit: 2020-01-10 | Discharge: 2020-01-10 | Disposition: A | Discharge status: Home / Self Care | Payer: Self-pay | Attending: Emergency Medicine | Admitting: Emergency Medicine

## 2020-01-10 ENCOUNTER — Emergency Department (HOSPITAL_COMMUNITY): Payer: Self-pay

## 2020-01-10 DIAGNOSIS — F1590 Other stimulant use, unspecified, uncomplicated: Secondary | ICD-10-CM | POA: Insufficient documentation

## 2020-01-10 DIAGNOSIS — J449 Chronic obstructive pulmonary disease, unspecified: Secondary | ICD-10-CM | POA: Insufficient documentation

## 2020-01-10 DIAGNOSIS — J8410 Pulmonary fibrosis, unspecified: Secondary | ICD-10-CM | POA: Insufficient documentation

## 2020-01-10 DIAGNOSIS — Z87891 Personal history of nicotine dependence: Secondary | ICD-10-CM | POA: Insufficient documentation

## 2020-01-10 DIAGNOSIS — Z7951 Long term (current) use of inhaled steroids: Secondary | ICD-10-CM | POA: Insufficient documentation

## 2020-01-10 DIAGNOSIS — Z8616 Personal history of COVID-19: Secondary | ICD-10-CM | POA: Insufficient documentation

## 2020-01-10 DIAGNOSIS — J45901 Unspecified asthma with (acute) exacerbation: Secondary | ICD-10-CM

## 2020-01-10 DIAGNOSIS — J4541 Moderate persistent asthma with (acute) exacerbation: Secondary | ICD-10-CM | POA: Insufficient documentation

## 2020-01-10 DIAGNOSIS — Z20822 Contact with and (suspected) exposure to covid-19: Secondary | ICD-10-CM | POA: Insufficient documentation

## 2020-01-10 LAB — SARS CORONAVIRUS 2 BY RT PCR (HOSPITAL ORDER, PERFORMED IN ~~LOC~~ HOSPITAL LAB): SARS Coronavirus 2: NEGATIVE

## 2020-01-10 MED ORDER — AEROCHAMBER PLUS FLO-VU LARGE MISC
1.0000 | Freq: Once | Status: AC
  Administered 2020-01-10: 1

## 2020-01-10 MED ORDER — IPRATROPIUM-ALBUTEROL 20-100 MCG/ACT IN AERS
1.0000 | INHALATION_SPRAY | Freq: Four times a day (QID) | RESPIRATORY_TRACT | Status: DC
  Administered 2020-01-10: 1 via RESPIRATORY_TRACT
  Filled 2020-01-10: qty 4

## 2020-01-10 MED ORDER — METHYLPREDNISOLONE SODIUM SUCC 125 MG IJ SOLR
125.0000 mg | Freq: Once | INTRAMUSCULAR | Status: AC
  Administered 2020-01-10: 125 mg via INTRAMUSCULAR
  Filled 2020-01-10: qty 2

## 2020-01-10 MED ORDER — ALBUTEROL SULFATE HFA 108 (90 BASE) MCG/ACT IN AERS
8.0000 | INHALATION_SPRAY | Freq: Once | RESPIRATORY_TRACT | Status: AC
  Administered 2020-01-10: 8 via RESPIRATORY_TRACT
  Filled 2020-01-10: qty 6.7

## 2020-01-10 MED ORDER — ALBUTEROL SULFATE HFA 108 (90 BASE) MCG/ACT IN AERS: 1.0000 | INHALATION_SPRAY | Freq: Four times a day (QID) | RESPIRATORY_TRACT | 5 refills | Status: DC | PRN

## 2020-01-10 MED ORDER — PREDNISONE 10 MG PO TABS: 40.0000 mg | ORAL_TABLET | Freq: Every day | ORAL | 0 refills | Status: AC

## 2020-01-10 NOTE — Discharge Instructions (Signed)
Follow up  in your mychart for your COVID test results. If your test is positive, you have the option to treat with antibody infusion.  Use inhalers as prescribed. Take Prednisone daily and complete the full course. Follow up with primary care for new diagnosis lung granuloma. Referral given if needed.

## 2020-01-10 NOTE — ED Triage Notes (Signed)
Pt arrived by [facility] for asthma and sob. Used his nebulizer at home with no relief, is out of his inhaler. No distress noted, spo2 97% on room air.

## 2020-01-10 NOTE — ED Notes (Addendum)
Pt stated he felt like he couldn't breath I checked his o2 it was 100% and his hr was 85. Informed nurse in triage

## 2020-01-10 NOTE — ED Provider Notes (Signed)
[name] Provider Note   CSN: [id number] Arrival date & time: 01/10/20  1001     History Chief Complaint  Patient presents with  . Asthma    [name] is a 39 y.o. male.  39 year old male with history of asthma brought in by EMS for asthma attack. Symptoms started last night at 11 PM, had an albuterol neb treatment at 9AM today without improvement in symptoms. Reports admitted to the [name] in January 2021, had COVID and was intubated. Denies fevers, body aches, sick contacts. Has not been vaccinated against COVID. No other complaints or concerns.   [name] was evaluated in Emergency Department on 01/10/2020 for the symptoms described in the history of present illness. He was evaluated in the context of the global COVID-19 pandemic, which necessitated consideration that the patient might be at risk for infection with the SARS-CoV-2 virus that causes COVID-19. Institutional protocols and algorithms that pertain to the evaluation of patients at risk for COVID-19 are in a state of rapid change based on information released by regulatory bodies including the CDC and federal and state organizations. These policies and algorithms were followed during the patient's care in the ED.         Past Medical History:  Diagnosis Date  . ADHD (attention deficit hyperactivity disorder)   . Asthma   . Bipolar 1 disorder (HCC)   . GERD (gastroesophageal reflux disease)   . Transaminitis 07/17/2015    Patient Active Problem List   Diagnosis Date Noted  . Substance induced mood disorder (HCC) 03/07/2018  . Depression   . 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 Bipolar II disorder (HCC) 01/31/2018  . Hypoxia   . COPD exacerbation (HCC)   . Leukocytosis 11/04/2015  . Transaminitis 07/17/2015  . Tooth ache 07/17/2015  . Asthma with status asthmaticus 10/14/2011  . ADHD (attention deficit  hyperactivity disorder) 10/14/2011    Past Surgical History:  Procedure Laterality Date  . TYMPANOPLASTY Left 1997       Family History  Problem Relation Age of Onset  . Coronary artery disease Father   . Heart attack Father   . Heart disease Father   . Heart failure Mother   . Hyperlipidemia Mother   . Hypertension Mother   . Migraines Mother   . COPD Mother   . Diabetes Maternal Aunt   . Asthma Maternal Grandfather   . Asthma Paternal Grandfather     Social History   Tobacco Use  . Smoking status: Former Smoker    Packs/day: 0.50    Types: Cigarettes    Quit date: 09/01/2018    Years since quitting: 1.3  . Smokeless tobacco: Never Used  Vaping Use  . Vaping Use: Never used  Substance Use Topics  . Alcohol use: Yes    Alcohol/week: 0.0 standard drinks  . Drug use: Yes    Types: Marijuana, Cocaine    Comment:                      Home Medications Prior to Admission medications   Medication Sig Start Date End Date Taking? Authorizing Provider  albuterol (PROVENTIL) (2.5 MG/3ML) 0.083% nebulizer solution Take 3 mLs (2.5 mg total) by nebulization every 6 (six) hours as needed for wheezing or shortness of breath. 08/25/18   Robinson, [place] N, PA-C  albuterol (VENTOLIN HFA) 108 (90 Base) MCG/ACT inhaler Inhale 1-2 puffs into the lungs every 6 (six)  hours as needed for wheezing or shortness of breath. 01/10/20   Jeannie Fend, PA-C  benzonatate (TESSALON) 100 MG capsule Take 1 capsule (100 mg total) by mouth 3 (three) times daily as needed for cough. Patient not taking: Reported on 09/25/2018 07/16/18   Tilden Fossa, MD  fluticasone [facility] HFA) 44 MCG/ACT inhaler Inhale 2 puffs into the lungs 2 (two) times daily. For asthma Patient not taking: Reported on 10/17/2018 02/09/18   Armandina Stammer I, NP  gabapentin (NEURONTIN) 400 MG capsule Take 1 capsule (400 mg total) by mouth 3 (three) times daily. For agitation Patient not taking: Reported on 04/08/2018 02/09/18   Armandina Stammer I, NP  hydrOXYzine (ATARAX/VISTARIL) 25 MG tablet Take 1 tablet (25 mg) by mouth three times daily & 2 tablets (50 mg) at bedtime for anxiety/sleep Patient not taking: Reported on 04/08/2018 02/09/18   Armandina Stammer I, NP  ibuprofen (ADVIL) 200 MG tablet Take 400 mg by mouth 2 (two) times daily as needed for moderate pain.    [provider]  mirtazapine (REMERON) 30 MG tablet Take 1 tablet (30 mg total) by mouth at bedtime. For depression Patient not taking: Reported on 04/08/2018 02/09/18   Armandina Stammer I, NP  OLANZapine (ZYPREXA) 2.5 MG tablet Take 1 tablet (2.5 mg total) by mouth at bedtime. For mood control Patient not taking: Reported on 04/08/2018 02/09/18   Armandina Stammer I, NP  ondansetron (ZOFRAN) 4 MG tablet Take 1 tablet (4 mg total) by mouth every 6 (six) hours as needed for nausea or vomiting. 10/28/18   Terrilee Files, MD  pantoprazole (PROTONIX) 40 MG tablet Take 1 tablet (40 mg total) by mouth daily. For acid reflux 10/28/18   Terrilee Files, MD  predniSONE (DELTASONE) 10 MG tablet Take 4 tablets (40 mg total) by mouth daily for 5 days. 01/10/20 01/15/20  Jeannie Fend, PA-C  promethazine (PHENERGAN) 25 MG tablet Take 1 tablet (25 mg total) by mouth every 6 (six) hours as needed for nausea. Patient not taking: Reported on 07/14/2018 06/04/18   Benjiman Core, MD    Allergies    Augmentin [amoxicillin-pot clavulanate] and Other  Review of Systems   Review of Systems  Constitutional: Negative for chills and fever.  HENT: Negative for congestion and sore throat.   Eyes: Negative for redness.  Respiratory: Positive for cough, shortness of breath and wheezing.   Cardiovascular: Negative for chest pain.  Gastrointestinal: Negative for nausea and vomiting.  Skin: Negative for rash and wound.  Allergic/Immunologic: Negative for immunocompromised state.  Neurological: Negative for weakness.  Hematological: Negative for adenopathy.  All other systems reviewed and  are negative.   Physical Exam Updated Vital Signs BP (!) 147/104 (BP Location: Right Arm)   Pulse 78   Temp 98 F (36.7 C) (Oral)   Resp 19   SpO2 98%   Physical Exam Vitals and nursing note reviewed.  Constitutional:      General: He is not in acute distress.    Appearance: He is well-developed. He is not diaphoretic.  HENT:     Head: Normocephalic and atraumatic.  Cardiovascular:     Rate and Rhythm: Normal rate and regular rhythm.     Heart sounds: Normal heart sounds. No murmur heard.  No friction rub.  Pulmonary:     Effort: Tachypnea present.     Breath sounds: Decreased air movement present. Examination of the right-lower field reveals decreased breath sounds. Examination of the left-lower field reveals decreased breath  sounds. Decreased breath sounds and wheezing present.  Skin:    General: Skin is warm and dry.     Findings: No erythema or rash.  Neurological:     Mental Status: He is alert and oriented to person, place, and time.  Psychiatric:        Behavior: Behavior normal.     ED Results / Procedures / Treatments   Labs (all labs ordered are listed, but only abnormal results are displayed) Labs Reviewed  SARS CORONAVIRUS 2 BY RT PCR ([name] ORDER, PERFORMED IN [facility] LAB)    EKG None  Radiology DG Chest Port 1 View  Result Date: 01/10/2020 CLINICAL DATA:  Shortness of breath and cough EXAM: PORTABLE CHEST 1 VIEW COMPARISON:  October 17, 2018. FINDINGS: There is a small granuloma in the left base. The lungs elsewhere are clear. Heart size and pulmonary vascularity are normal. No adenopathy. No bone lesions. IMPRESSION: Small granuloma left base. Lungs elsewhere clear. Cardiac silhouette normal. Electronically Signed   By: Bretta Bang III M.D.   On: 01/10/2020 11:59    Procedures Procedures (including critical care time)  Medications Ordered in ED Medications  Ipratropium-Albuterol (COMBIVENT) respimat 1 puff (1 puff Inhalation  Given 01/10/20 1224)  methylPREDNISolone sodium succinate (SOLU-MEDROL) 125 mg/2 mL injection 125 mg (125 mg Intramuscular Given 01/10/20 1152)  albuterol (VENTOLIN HFA) 108 (90 Base) MCG/ACT inhaler 8 puff (8 puffs Inhalation Given 01/10/20 1151)  AeroChamber Plus Flo-Vu Large MISC 1 each (1 each Other Given 01/10/20 1223)    ED Course  I have reviewed the triage vital signs and the nursing notes.  Pertinent labs & imaging results that were available during my care of the patient were reviewed by me and considered in my medical decision making (see chart for details).  Clinical Course as of Jan 10 1308  Mon Jan 10, 2020  5930 39 year old male presents for asthma exacerbation.  Denies sick symptoms today however states that he did test positive for Covid in January and had to be intubated.  This occurred in [place].  On exam, patient is mildly tachypneic, diminished lung sounds in the bases with wheezing throughout.  Patient was given Combivent inhaler as well as albuterol inhaler treatments with IM Solu-Medrol.  Patient is feeling significantly improved and ready for discharge.  Chest x-ray today does show new diagnosis left lower lung granuloma.  Patient states that he is aware of this from prior work-up however I do not see this in our system previously.  Advised patient he should follow-up with PCP, will give referral for PCP for further work-up outpatient.  Given prescription for 5-day course of prednisone, discharged with his Combivent and albuterol inhalers with additional prescription for albuterol inhaler.   [LM]  1309 Patient was tested for Covid, results are pending.  If positive, he may qualify for antibody treatment.  Patient to follow-up results in MyChart.   [LM]    Clinical Course User Index [LM] Alden Hipp   MDM Rules/Calculators/A&P                          Final Clinical Impression(s) / ED Diagnoses Final diagnoses:  Moderate asthma with exacerbation, unspecified  whether persistent  Lung granuloma (HCC)    Rx / DC Orders ED Discharge Orders         Ordered    predniSONE (DELTASONE) 10 MG tablet  Daily        01/10/20 1303  albuterol (VENTOLIN HFA) 108 (90 Base) MCG/ACT inhaler  Every 6 hours PRN        01/10/20 1303           Jeannie FendMurphy, Regene Mccarthy A, PA-C 01/10/20 1309    Blane OharaZavitz, Joshua, MD 01/11/20 2326

## 2020-04-03 ENCOUNTER — Encounter (HOSPITAL_COMMUNITY): Payer: Self-pay

## 2020-04-03 ENCOUNTER — Emergency Department (HOSPITAL_COMMUNITY)
Admission: EM | Admit: 2020-04-03 | Discharge: 2020-04-03 | Disposition: A | Discharge status: Home / Self Care | Payer: Self-pay | Attending: Emergency Medicine | Admitting: Emergency Medicine

## 2020-04-03 ENCOUNTER — Other Ambulatory Visit: Payer: Self-pay

## 2020-04-03 DIAGNOSIS — J4521 Mild intermittent asthma with (acute) exacerbation: Secondary | ICD-10-CM | POA: Insufficient documentation

## 2020-04-03 DIAGNOSIS — J441 Chronic obstructive pulmonary disease with (acute) exacerbation: Secondary | ICD-10-CM | POA: Insufficient documentation

## 2020-04-03 DIAGNOSIS — Z7952 Long term (current) use of systemic steroids: Secondary | ICD-10-CM | POA: Insufficient documentation

## 2020-04-03 DIAGNOSIS — Z87891 Personal history of nicotine dependence: Secondary | ICD-10-CM | POA: Insufficient documentation

## 2020-04-03 MED ORDER — ALBUTEROL SULFATE HFA 108 (90 BASE) MCG/ACT IN AERS: 1.0000 | INHALATION_SPRAY | Freq: Four times a day (QID) | RESPIRATORY_TRACT | 2 refills | Status: DC | PRN

## 2020-04-03 MED ORDER — DEXAMETHASONE SODIUM PHOSPHATE 10 MG/ML IJ SOLN
10.0000 mg | Freq: Once | INTRAMUSCULAR | Status: AC
  Administered 2020-04-03: 10 mg via INTRAMUSCULAR
  Filled 2020-04-03: qty 1

## 2020-04-03 MED ORDER — ALBUTEROL SULFATE HFA 108 (90 BASE) MCG/ACT IN AERS
1.0000 | INHALATION_SPRAY | Freq: Once | RESPIRATORY_TRACT | Status: AC
  Administered 2020-04-03: 1 via RESPIRATORY_TRACT
  Filled 2020-04-03: qty 6.7

## 2020-04-03 NOTE — ED Triage Notes (Signed)
Patient reports a history of asthma and states that he used his Albuterol inhaler twice while at work and did not help much. sats in triage 100%.  Patient states he will  Need a refill on his Albuterol inhaler.

## 2020-04-03 NOTE — ED Provider Notes (Signed)
[facility]-EMERGENCY DEPT Provider Note   CSN: [id number] Arrival date & time: 04/03/20  7209     History Chief Complaint  Patient presents with  . Asthma    [name] is a 39 y.o. male with a past medical history of asthma, GERD presenting to the ED with a chief complaint of wheezing.  States that this morning he woke up, was walking to work when he started experiencing wheezing and shortness of breath associated with his asthma.  He uses albuterol inhaler twice with significant improvement.  However his supervisor told him to come to the ER to be evaluated.  States that these were the last 2 puffs of his inhaler and is needing a refill.  Denies any chest pain, cough, fever, abdominal pain, vomiting or sick contacts with similar symptoms.  He had Covid earlier this year.  HPI     Past Medical History:  Diagnosis Date  . ADHD (attention deficit hyperactivity disorder)   . Asthma   . Bipolar 1 disorder (HCC)   . GERD (gastroesophageal reflux disease)   . Transaminitis 07/17/2015    Patient Active Problem List   Diagnosis Date Noted  . Substance induced mood disorder (HCC) 03/07/2018  . Depression   . 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 Bipolar II disorder (HCC) 01/31/2018  . Hypoxia   . COPD exacerbation (HCC)   . Leukocytosis 11/04/2015  . Transaminitis 07/17/2015  . Tooth ache 07/17/2015  . Asthma with status asthmaticus 10/14/2011  . ADHD (attention deficit hyperactivity disorder) 10/14/2011    Past Surgical History:  Procedure Laterality Date  . TYMPANOPLASTY Left 1997       Family History  Problem Relation Age of Onset  . Coronary artery disease Father   . Heart attack Father   . Heart disease Father   . Heart failure Mother   . Hyperlipidemia Mother   . Hypertension Mother   . Migraines Mother   . COPD Mother   . Diabetes Maternal Aunt   . Asthma Maternal Grandfather   .  Asthma Paternal Grandfather     Social History   Tobacco Use  . Smoking status: Former Smoker    Packs/day: 0.50    Types: Cigarettes    Quit date: 09/01/2018    Years since quitting: 1.5  . Smokeless tobacco: Never Used  Vaping Use  . Vaping Use: Never used  Substance Use Topics  . Alcohol use: Not Currently    Alcohol/week: 0.0 standard drinks  . Drug use: Yes    Types: Marijuana    Comment:                      Home Medications Prior to Admission medications   Medication Sig Start Date End Date Taking? Authorizing Provider  albuterol (VENTOLIN HFA) 108 (90 Base) MCG/ACT inhaler Inhale 1-2 puffs into the lungs every 6 (six) hours as needed for wheezing or shortness of breath. 04/03/20   Aila Terra, PA-C  benzonatate (TESSALON) 100 MG capsule Take 1 capsule (100 mg total) by mouth 3 (three) times daily as needed for cough. Patient not taking: Reported on 09/25/2018 07/16/18   Tilden Fossa, MD  fluticasone [facility] HFA) 44 MCG/ACT inhaler Inhale 2 puffs into the lungs 2 (two) times daily. For asthma Patient not taking: Reported on 10/17/2018 02/09/18   Armandina Stammer I, NP  gabapentin (NEURONTIN) 400 MG capsule Take 1 capsule (400 mg total) by mouth 3 (  three) times daily. For agitation Patient not taking: Reported on 04/08/2018 02/09/18   Armandina Stammer I, NP  hydrOXYzine (ATARAX/VISTARIL) 25 MG tablet Take 1 tablet (25 mg) by mouth three times daily & 2 tablets (50 mg) at bedtime for anxiety/sleep Patient not taking: Reported on 04/08/2018 02/09/18   Armandina Stammer I, NP  ibuprofen (ADVIL) 200 MG tablet Take 400 mg by mouth 2 (two) times daily as needed for moderate pain.    [provider]  mirtazapine (REMERON) 30 MG tablet Take 1 tablet (30 mg total) by mouth at bedtime. For depression Patient not taking: Reported on 04/08/2018 02/09/18   Armandina Stammer I, NP  OLANZapine (ZYPREXA) 2.5 MG tablet Take 1 tablet (2.5 mg total) by mouth at bedtime. For mood control Patient not  taking: Reported on 04/08/2018 02/09/18   Armandina Stammer I, NP  ondansetron (ZOFRAN) 4 MG tablet Take 1 tablet (4 mg total) by mouth every 6 (six) hours as needed for nausea or vomiting. 10/28/18   Terrilee Files, MD  pantoprazole (PROTONIX) 40 MG tablet Take 1 tablet (40 mg total) by mouth daily. For acid reflux 10/28/18   Terrilee Files, MD  promethazine (PHENERGAN) 25 MG tablet Take 1 tablet (25 mg total) by mouth every 6 (six) hours as needed for nausea. Patient not taking: Reported on 07/14/2018 06/04/18   Benjiman Core, MD    Allergies    Augmentin [amoxicillin-pot clavulanate] and Other  Review of Systems   Review of Systems  Constitutional: Negative for chills and fever.  Respiratory: Positive for wheezing (Resolved). Negative for chest tightness and shortness of breath.   Cardiovascular: Negative for chest pain.    Physical Exam Updated Vital Signs BP 124/89 (BP Location: Left Arm)   Pulse 82   Temp 98.1 F (36.7 C) (Oral)   Resp (!) 22   Ht 5\' 10"  (1.778 m)   Wt 88.5 kg   SpO2 100%   BMI 27.98 kg/m   Physical Exam Vitals and nursing note reviewed.  Constitutional:      General: He is not in acute distress.    Appearance: He is well-developed and well-nourished. He is not diaphoretic.     Comments: Speaking complete sentences without difficulty.  No signs of respiratory distress.  HENT:     Head: Normocephalic and atraumatic.  Eyes:     General: No scleral icterus.    Extraocular Movements: EOM normal.     Conjunctiva/sclera: Conjunctivae normal.  Cardiovascular:     Rate and Rhythm: Normal rate and regular rhythm.     Heart sounds: Normal heart sounds.  Pulmonary:     Effort: Pulmonary effort is normal. No respiratory distress.     Breath sounds: Normal breath sounds. No stridor.     Comments: No stridor or wheezing. Musculoskeletal:     Cervical back: Normal range of motion.     Right lower leg: No edema.     Left lower leg: No edema.  Skin:     Findings: No rash.  Neurological:     Mental Status: He is alert.  Psychiatric:        Mood and Affect: Mood and affect normal.     ED Results / Procedures / Treatments   Labs (all labs ordered are listed, but only abnormal results are displayed) Labs Reviewed - No data to display  EKG None  Radiology No results found.  Procedures Procedures (including critical care time)  Medications Ordered in ED Medications  albuterol (VENTOLIN  HFA) 108 (90 Base) MCG/ACT inhaler 1 puff (has no administration in time range)  dexamethasone (DECADRON) injection 10 mg (has no administration in time range)    ED Course  I have reviewed the triage vital signs and the nursing notes.  Pertinent labs & imaging results that were available during my care of the patient were reviewed by me and considered in my medical decision making (see chart for details).    MDM Rules/Calculators/A&P                          39 year old male with a past medical history of asthma presenting to the ED for asthma exacerbation and requesting refill of albuterol inhaler.  His symptoms began when he was walking to work this morning in the cold weather.  Symptoms resolved with albuterol inhaler use x2 puffs.  He was told by his supervisor to come to the ER due to his symptoms.  On my exam patient denying any wheezing, shortness of breath, chest pain or cough.  Lungs are clear to auscultation bilaterally, no wheezing or stridor noted.  No signs of respiratory distress.  Oxygen saturations 100% on room air.  Suspect acute flareup of wheezing secondary to cold weather.  Will give refill of albuterol here as well as for home and will treat with Decadron per his request.  Return precautions given.   Patient is hemodynamically stable, in NAD, and able to ambulate in the ED. Evaluation does not show pathology that would require ongoing emergent intervention or inpatient treatment. I explained the diagnosis to the patient. Pain  has been managed and has no complaints prior to discharge. Patient is comfortable with above plan and is stable for discharge at this time. All questions were answered prior to disposition. Strict return precautions for returning to the ED were discussed. Encouraged follow up with PCP.   An After Visit Summary was printed and given to the patient.   Portions of this note were generated with Scientist, clinical (histocompatibility and immunogenetics). Dictation errors may occur despite best attempts at proofreading.  Final Clinical Impression(s) / ED Diagnoses Final diagnoses:  Mild intermittent asthma with acute exacerbation    Rx / DC Orders ED Discharge Orders         Ordered    albuterol (VENTOLIN HFA) 108 (90 Base) MCG/ACT inhaler  Every 6 hours PRN        04/03/20 1052           [name], PA-C 04/03/20 1053    Alvira Monday, MD 04/05/20 1121

## 2020-04-03 NOTE — Discharge Instructions (Signed)
Use your inhaler as needed. The steroids that we have given you today will help over the next few days with your symptoms. Return to the ER if you start to experience worsening chest pain, shortness of breath, wheezing or chest tightness.

## 2020-06-09 ENCOUNTER — Emergency Department (HOSPITAL_COMMUNITY)
Admission: EM | Admit: 2020-06-09 | Discharge: 2020-06-09 | Disposition: A | Discharge status: Home / Self Care | Payer: Self-pay | Attending: Emergency Medicine | Admitting: Emergency Medicine

## 2020-06-09 ENCOUNTER — Other Ambulatory Visit: Payer: Self-pay

## 2020-06-09 ENCOUNTER — Encounter (HOSPITAL_COMMUNITY): Payer: Self-pay

## 2020-06-09 ENCOUNTER — Emergency Department (HOSPITAL_COMMUNITY): Payer: Self-pay

## 2020-06-09 DIAGNOSIS — J45909 Unspecified asthma, uncomplicated: Secondary | ICD-10-CM | POA: Insufficient documentation

## 2020-06-09 DIAGNOSIS — S29011A Strain of muscle and tendon of front wall of thorax, initial encounter: Secondary | ICD-10-CM | POA: Insufficient documentation

## 2020-06-09 DIAGNOSIS — J441 Chronic obstructive pulmonary disease with (acute) exacerbation: Secondary | ICD-10-CM | POA: Insufficient documentation

## 2020-06-09 DIAGNOSIS — S39011A Strain of muscle, fascia and tendon of abdomen, initial encounter: Secondary | ICD-10-CM | POA: Insufficient documentation

## 2020-06-09 DIAGNOSIS — T148XXA Other injury of unspecified body region, initial encounter: Secondary | ICD-10-CM

## 2020-06-09 DIAGNOSIS — X58XXXA Exposure to other specified factors, initial encounter: Secondary | ICD-10-CM | POA: Insufficient documentation

## 2020-06-09 DIAGNOSIS — Z87891 Personal history of nicotine dependence: Secondary | ICD-10-CM | POA: Insufficient documentation

## 2020-06-09 DIAGNOSIS — R059 Cough, unspecified: Secondary | ICD-10-CM | POA: Insufficient documentation

## 2020-06-09 LAB — COMPREHENSIVE METABOLIC PANEL
ALT: 23 U/L (ref 0–44)
AST: 19 U/L (ref 15–41)
Albumin: 4.1 g/dL (ref 3.5–5.0)
Alkaline Phosphatase: 51 U/L (ref 38–126)
Anion gap: 8 (ref 5–15)
BUN: 14 mg/dL (ref 6–20)
CO2: 25 mmol/L (ref 22–32)
Calcium: 9.3 mg/dL (ref 8.9–10.3)
Chloride: 107 mmol/L (ref 98–111)
Creatinine, Ser: 0.85 mg/dL (ref 0.61–1.24)
GFR, Estimated: >60 mL/min (ref >60)
Glucose, Bld: 101 mg/dL — ABNORMAL HIGH (ref 70–99)
Potassium: 4.2 mmol/L (ref 3.5–5.1)
Sodium: 140 mmol/L (ref 135–145)
Total Bilirubin: 0.9 mg/dL (ref 0.3–1.2)
Total Protein: 7.4 g/dL (ref 6.5–8.1)

## 2020-06-09 LAB — CBC WITH DIFFERENTIAL/PLATELET
Abs Immature Granulocytes: 0.02 10*3/uL (ref 0.00–0.07)
Basophils Absolute: 0 10*3/uL (ref 0.0–0.1)
Basophils Relative: 0 %
Eosinophils Absolute: 0.5 10*3/uL (ref 0.0–0.5)
Eosinophils Relative: 6 %
HCT: 42.5 % (ref 39.0–52.0)
Hemoglobin: 14.4 g/dL (ref 13.0–17.0)
Immature Granulocytes: 0 %
Lymphocytes Relative: 26 %
Lymphs Abs: 2.1 10*3/uL (ref 0.7–4.0)
MCH: 30.7 pg (ref 26.0–34.0)
MCHC: 33.9 g/dL (ref 30.0–36.0)
MCV: 90.6 fL (ref 80.0–100.0)
Monocytes Absolute: 0.7 10*3/uL (ref 0.1–1.0)
Monocytes Relative: 8 %
Neutro Abs: 5 10*3/uL (ref 1.7–7.7)
Neutrophils Relative %: 60 %
Platelets: 301 10*3/uL (ref 150–400)
RBC: 4.69 MIL/uL (ref 4.22–5.81)
RDW: 12.4 % (ref 11.5–15.5)
WBC: 8.4 10*3/uL (ref 4.0–10.5)
nRBC: 0 % (ref 0.0–0.2)

## 2020-06-09 MED ORDER — BENZONATATE 200 MG PO CAPS: 200.0000 mg | ORAL_CAPSULE | Freq: Three times a day (TID) | ORAL | 0 refills | Status: AC | PRN

## 2020-06-09 MED ORDER — ALBUTEROL SULFATE HFA 108 (90 BASE) MCG/ACT IN AERS: 1.0000 | INHALATION_SPRAY | Freq: Four times a day (QID) | RESPIRATORY_TRACT | 2 refills | Status: DC | PRN

## 2020-06-09 MED ORDER — IBUPROFEN 200 MG PO TABS: 400.0000 mg | ORAL_TABLET | Freq: Two times a day (BID) | ORAL | 0 refills | Status: DC | PRN

## 2020-06-09 NOTE — ED Triage Notes (Signed)
Pt c/o left sided abd pain starting this am. Denies GI issues, denies n/v/d. Pt has constant cough from asthma, c/o pain worsens when coughing. Pt A&O x 4

## 2020-06-09 NOTE — ED Provider Notes (Signed)
[facility]-EMERGENCY DEPT Provider Note   CSN: [id number] Arrival date & time: 06/09/20  0730     History Chief Complaint  Patient presents with  . Abdominal Pain    Devin [name] is a 40 y.o. male.  40 year old male with history of ADHD, asthma, bipolar disorder and GERD presents with complaint of left-sided rib and abdominal pain for the past few days ever since he coughed.  Pain is worse with coughing and sneezing as well as movement and palpation.  Denies nausea, vomiting, changes in bowel or bladder habits, fevers, chills.  No other complaints or concerns.        Past Medical History:  Diagnosis Date  . ADHD (attention deficit hyperactivity disorder)   . Asthma   . Bipolar 1 disorder (HCC)   . GERD (gastroesophageal reflux disease)   . Transaminitis 07/17/2015    Patient Active Problem List   Diagnosis Date Noted  . Substance induced mood disorder (HCC) 03/07/2018  . Depression   . 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 Bipolar II disorder (HCC) 01/31/2018  . Hypoxia   . COPD exacerbation (HCC)   . Leukocytosis 11/04/2015  . Transaminitis 07/17/2015  . Tooth ache 07/17/2015  . Asthma with status asthmaticus 10/14/2011  . ADHD (attention deficit hyperactivity disorder) 10/14/2011    Past Surgical History:  Procedure Laterality Date  . TYMPANOPLASTY Left 1997       Family History  Problem Relation Age of Onset  . Coronary artery disease Father   . Heart attack Father   . Heart disease Father   . Heart failure Mother   . Hyperlipidemia Mother   . Hypertension Mother   . Migraines Mother   . COPD Mother   . Diabetes Maternal Aunt   . Asthma Maternal Grandfather   . Asthma Paternal Grandfather     Social History   Tobacco Use  . Smoking status: Former Smoker    Packs/day: 0.50    Types: Cigarettes    Quit date: 09/01/2018    Years since quitting: 1.7  . Smokeless tobacco:  Never Used  Vaping Use  . Vaping Use: Never used  Substance Use Topics  . Alcohol use: Yes    Alcohol/week: 0.0 standard drinks    Comment: occ  . Drug use: Yes    Types: Marijuana    Comment:                      Home Medications Prior to Admission medications   Medication Sig Start Date End Date Taking? Authorizing Provider  albuterol (VENTOLIN HFA) 108 (90 Base) MCG/ACT inhaler Inhale 1-2 puffs into the lungs every 6 (six) hours as needed for wheezing or shortness of breath. 06/09/20   Jeannie Fend, PA-C  benzonatate (TESSALON) 200 MG capsule Take 1 capsule (200 mg total) by mouth 3 (three) times daily as needed for up to 10 days for cough. 06/09/20 06/19/20  Jeannie Fend, PA-C  fluticasone (FLOVENT HFA) 44 MCG/ACT inhaler Inhale 2 puffs into the lungs 2 (two) times daily. For asthma Patient not taking: Reported on 10/17/2018 02/09/18   Armandina Stammer I, NP  gabapentin (NEURONTIN) 400 MG capsule Take 1 capsule (400 mg total) by mouth 3 (three) times daily. For agitation Patient not taking: Reported on 04/08/2018 02/09/18   Armandina Stammer I, NP  hydrOXYzine (ATARAX/VISTARIL) 25 MG tablet Take 1 tablet (25 mg) by mouth three times daily & 2 tablets (  50 mg) at bedtime for anxiety/sleep Patient not taking: Reported on 04/08/2018 02/09/18   Armandina Stammer I, NP  ibuprofen (ADVIL) 200 MG tablet Take 2 tablets (400 mg total) by mouth 2 (two) times daily as needed for moderate pain. 06/09/20   Jeannie Fend, PA-C  mirtazapine (REMERON) 30 MG tablet Take 1 tablet (30 mg total) by mouth at bedtime. For depression Patient not taking: Reported on 04/08/2018 02/09/18   Armandina Stammer I, NP  OLANZapine (ZYPREXA) 2.5 MG tablet Take 1 tablet (2.5 mg total) by mouth at bedtime. For mood control Patient not taking: Reported on 04/08/2018 02/09/18   Armandina Stammer I, NP  ondansetron (ZOFRAN) 4 MG tablet Take 1 tablet (4 mg total) by mouth every 6 (six) hours as needed for nausea or vomiting. 10/28/18   Terrilee Files, MD  pantoprazole (PROTONIX) 40 MG tablet Take 1 tablet (40 mg total) by mouth daily. For acid reflux 10/28/18   Terrilee Files, MD  promethazine (PHENERGAN) 25 MG tablet Take 1 tablet (25 mg total) by mouth every 6 (six) hours as needed for nausea. Patient not taking: Reported on 07/14/2018 06/04/18   Benjiman Core, MD    Allergies    Augmentin [amoxicillin-pot clavulanate] and Other  Review of Systems   Review of Systems  Constitutional: Negative for chills and fever.  HENT: Positive for sneezing.   Respiratory: Positive for cough. Negative for shortness of breath.   Cardiovascular: Negative for chest pain.  Gastrointestinal: Positive for abdominal pain. Negative for constipation, diarrhea, nausea and vomiting.  Genitourinary: Negative for dysuria and frequency.  Musculoskeletal: Positive for myalgias. Negative for back pain.  Skin: Negative for rash and wound.  Allergic/Immunologic: Negative for immunocompromised state.  Neurological: Negative for weakness.  All other systems reviewed and are negative.   Physical Exam Updated Vital Signs BP 122/85 (BP Location: Left Arm)   Pulse (!) 58   Temp 98.9 F (37.2 C) (Oral)   Resp 16   Ht 5\' 8"  (1.727 m)   Wt 88 kg   SpO2 99%   BMI 29.50 kg/m   Physical Exam Vitals and nursing note reviewed.  Constitutional:      General: He is not in acute distress.    Appearance: He is well-developed and well-nourished. He is not diaphoretic.  HENT:     Head: Normocephalic and atraumatic.  Cardiovascular:     Rate and Rhythm: Normal rate and regular rhythm.     Heart sounds: Normal heart sounds.  Pulmonary:     Effort: Pulmonary effort is normal.     Breath sounds: Normal breath sounds.  Chest:     Chest wall: Tenderness present.    Abdominal:     Palpations: Abdomen is soft.     Tenderness: There is abdominal tenderness in the left upper quadrant and left lower quadrant. There is no right CVA tenderness or left CVA  tenderness.       Comments: Tenderness to left side chest and abdomen with light palpation.  Skin:    General: Skin is warm and dry.     Findings: No erythema or rash.  Neurological:     Mental Status: He is alert and oriented to person, place, and time.  Psychiatric:        Mood and Affect: Mood and affect normal.        Behavior: Behavior normal.     ED Results / Procedures / Treatments   Labs (all labs ordered are listed, but  only abnormal results are displayed) Labs Reviewed  COMPREHENSIVE METABOLIC PANEL - Abnormal; Notable for the following components:      Result Value   Glucose, Bld 101 (*)    All other components within normal limits  CBC WITH DIFFERENTIAL/PLATELET    EKG None  Radiology DG Chest 2 View  Result Date: 06/09/2020 CLINICAL DATA:  Cough, left axillary pain EXAM: CHEST - 2 VIEW COMPARISON:  01/10/2020 FINDINGS: The heart size and mediastinal contours are within normal limits. Both lungs are clear. The visualized skeletal structures are unremarkable. IMPRESSION: Normal study. Electronically Signed   By: Charlett Nose M.D.   On: 06/09/2020 08:22    Procedures Procedures   Medications Ordered in ED Medications - No data to display  ED Course  I have reviewed the triage vital signs and the nursing notes.  Pertinent labs & imaging results that were available during my care of the patient were reviewed by me and considered in my medical decision making (see chart for details).  Clinical Course as of 06/09/20 1010  Fri Jun 09, 2020  [age]-year-old male with complaint of pain in his ribs and left side ever since coughing a few days ago.  Pain is worse with coughing and sneezing, no associated symptoms.  Chest x-ray unremarkable, CBC and CMP without significant findings.  Plan is to treat for muscle strain related to his cough with ibuprofen.  Will refill his albuterol inhaler and give prescription for Tessalon.  Patient to follow-up PCP if symptoms  persist, return to ED for worsening or concerning symptoms. [LM]    Clinical Course User Index [LM] Alden Hipp   MDM Rules/Calculators/A&P                           Final Clinical Impression(s) / ED Diagnoses Final diagnoses:  Cough  Muscle strain    Rx / DC Orders ED Discharge Orders         Ordered    albuterol (VENTOLIN HFA) 108 (90 Base) MCG/ACT inhaler  Every 6 hours PRN        06/09/20 1007    benzonatate (TESSALON) 200 MG capsule  3 times daily PRN        06/09/20 1007    ibuprofen (ADVIL) 200 MG tablet  2 times daily PRN        06/09/20 1007           Alden Hipp 06/09/20 1010    Lorre Nick, MD 06/13/20 1007

## 2020-06-09 NOTE — ED Notes (Signed)
Pt ambulated to restroom, steady gait, no complications

## 2020-06-09 NOTE — Discharge Instructions (Signed)
Take Tessalon as needed as prescribed for cough. Use inhaler as needed as prescribed Take Motrin as needed as prescribed for pain. Follow-up with your primary care provider if symptoms persist.  Return to the emergency room for worsening or concerning symptoms.

## 2020-08-07 ENCOUNTER — Emergency Department (HOSPITAL_COMMUNITY)
Admission: EM | Admit: 2020-08-07 | Discharge: 2020-08-07 | Disposition: A | Discharge status: Home / Self Care | Payer: Self-pay | Attending: Emergency Medicine | Admitting: Emergency Medicine

## 2020-08-07 ENCOUNTER — Encounter (HOSPITAL_COMMUNITY): Payer: Self-pay

## 2020-08-07 DIAGNOSIS — M79604 Pain in right leg: Secondary | ICD-10-CM

## 2020-08-07 DIAGNOSIS — J441 Chronic obstructive pulmonary disease with (acute) exacerbation: Secondary | ICD-10-CM | POA: Insufficient documentation

## 2020-08-07 DIAGNOSIS — J45909 Unspecified asthma, uncomplicated: Secondary | ICD-10-CM | POA: Insufficient documentation

## 2020-08-07 DIAGNOSIS — Z87891 Personal history of nicotine dependence: Secondary | ICD-10-CM | POA: Insufficient documentation

## 2020-08-07 DIAGNOSIS — Z7951 Long term (current) use of inhaled steroids: Secondary | ICD-10-CM | POA: Insufficient documentation

## 2020-08-07 DIAGNOSIS — M79651 Pain in right thigh: Secondary | ICD-10-CM | POA: Insufficient documentation

## 2020-08-07 MED ORDER — KETOROLAC TROMETHAMINE 30 MG/ML IJ SOLN
60.0000 mg | Freq: Once | INTRAMUSCULAR | Status: AC
  Administered 2020-08-07: 60 mg via INTRAMUSCULAR
  Filled 2020-08-07: qty 2

## 2020-08-07 MED ORDER — METHOCARBAMOL 500 MG PO TABS: 500.0000 mg | ORAL_TABLET | Freq: Two times a day (BID) | ORAL | 0 refills | Status: DC

## 2020-08-07 NOTE — ED Notes (Signed)
An After Visit Summary was printed and given to the patient. Discharge instructions given and no further questions at this time.  

## 2020-08-07 NOTE — Discharge Instructions (Signed)
Please pick up muscle relaxer and take as prescribed. It is recommended that you take this at nighttime to help you sleep. DO NOT DRIVE WHILE ON THIS MEDICATION AS IT CAN MAKE YOU DROWSY. You can also apply OTC voltaren gel to the area to help with pain.   Follow up with [facility] for primary care needs.   Return to the ED for any worsening symptoms

## 2020-08-07 NOTE — ED Triage Notes (Signed)
Pt presents with c/o right inner thigh pain for approx 4-5 days after playing with his kids. Pt was ambulatory for EMS but has pain with ambulation.

## 2020-08-07 NOTE — ED Provider Notes (Signed)
[facility]-EMERGENCY DEPT Provider Note   CSN: [id number] Arrival date & time: 08/07/20  1404     History Chief Complaint  Patient presents with  . Leg Pain    Devin [name] is a 40 y.o. male who presents to the ED today with complaint of gradual onset, constant, achy, right medial thigh pain for the past 4 to 5 days after he was playing with his children.  He denies any fall or trauma to this area however thinks he may have just overdone it with his children.  He has been taking ibuprofen and Tylenol as needed for pain without much relief.  The pain does radiate around into his buttock area.  Denies any testicular pain.  He denies any abdominal pain.  He does admit that he is here today because he did not feel like he could go to work today and was advised he would need to come to the ED for further evaluation.  The history is provided by the patient and medical records.       Past Medical History:  Diagnosis Date  . ADHD (attention deficit hyperactivity disorder)   . Asthma   . Bipolar 1 disorder (HCC)   . GERD (gastroesophageal reflux disease)   . Transaminitis 07/17/2015    Patient Active Problem List   Diagnosis Date Noted  . Substance induced mood disorder (HCC) 03/07/2018  . Depression   . 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 Bipolar II disorder (HCC) 01/31/2018  . Hypoxia   . COPD exacerbation (HCC)   . Leukocytosis 11/04/2015  . Transaminitis 07/17/2015  . Tooth ache 07/17/2015  . Asthma with status asthmaticus 10/14/2011  . ADHD (attention deficit hyperactivity disorder) 10/14/2011    Past Surgical History:  Procedure Laterality Date  . TYMPANOPLASTY Left 1997       Family History  Problem Relation Age of Onset  . Coronary artery disease Father   . Heart attack Father   . Heart disease Father   . Heart failure Mother   . Hyperlipidemia Mother   . Hypertension Mother   .  Migraines Mother   . COPD Mother   . Diabetes Maternal Aunt   . Asthma Maternal Grandfather   . Asthma Paternal Grandfather     Social History   Tobacco Use  . Smoking status: Former Smoker    Packs/day: 0.50    Types: Cigarettes    Quit date: 09/01/2018    Years since quitting: 1.9  . Smokeless tobacco: Never Used  Vaping Use  . Vaping Use: Never used  Substance Use Topics  . Alcohol use: Yes    Alcohol/week: 0.0 standard drinks    Comment: occ  . Drug use: Yes    Types: Marijuana    Comment:                      Home Medications Prior to Admission medications   Medication Sig Start Date End Date Taking? Authorizing Provider  methocarbamol (ROBAXIN) 500 MG tablet Take 1 tablet (500 mg total) by mouth 2 (two) times daily. 08/07/20  Yes Eyanna Mcgonagle, PA-C  albuterol (VENTOLIN HFA) 108 (90 Base) MCG/ACT inhaler Inhale 1-2 puffs into the lungs every 6 (six) hours as needed for wheezing or shortness of breath. 06/09/20   Jeannie Fend, PA-C  fluticasone (FLOVENT HFA) 44 MCG/ACT inhaler Inhale 2 puffs into the lungs 2 (two) times daily. For asthma Patient not taking: Reported  on 10/17/2018 02/09/18   Armandina Stammer I, NP  gabapentin (NEURONTIN) 400 MG capsule Take 1 capsule (400 mg total) by mouth 3 (three) times daily. For agitation Patient not taking: Reported on 04/08/2018 02/09/18   Armandina Stammer I, NP  hydrOXYzine (ATARAX/VISTARIL) 25 MG tablet Take 1 tablet (25 mg) by mouth three times daily & 2 tablets (50 mg) at bedtime for anxiety/sleep Patient not taking: Reported on 04/08/2018 02/09/18   Armandina Stammer I, NP  ibuprofen (ADVIL) 200 MG tablet Take 2 tablets (400 mg total) by mouth 2 (two) times daily as needed for moderate pain. 06/09/20   Jeannie Fend, PA-C  mirtazapine (REMERON) 30 MG tablet Take 1 tablet (30 mg total) by mouth at bedtime. For depression Patient not taking: Reported on 04/08/2018 02/09/18   Armandina Stammer I, NP  OLANZapine (ZYPREXA) 2.5 MG tablet Take 1  tablet (2.5 mg total) by mouth at bedtime. For mood control Patient not taking: Reported on 04/08/2018 02/09/18   Armandina Stammer I, NP  ondansetron (ZOFRAN) 4 MG tablet Take 1 tablet (4 mg total) by mouth every 6 (six) hours as needed for nausea or vomiting. 10/28/18   Terrilee Files, MD  pantoprazole (PROTONIX) 40 MG tablet Take 1 tablet (40 mg total) by mouth daily. For acid reflux 10/28/18   Terrilee Files, MD  promethazine (PHENERGAN) 25 MG tablet Take 1 tablet (25 mg total) by mouth every 6 (six) hours as needed for nausea. Patient not taking: Reported on 07/14/2018 06/04/18   Benjiman Core, MD    Allergies    Augmentin [amoxicillin-pot clavulanate] and Other  Review of Systems   Review of Systems  Constitutional: Negative for chills and fever.  Gastrointestinal: Negative for abdominal pain.  Musculoskeletal: Positive for arthralgias.  Skin: Negative for color change.  All other systems reviewed and are negative.   Physical Exam Updated Vital Signs BP (!) 128/106 (BP Location: Right Arm)   Pulse 74   Temp 98.4 F (36.9 C) (Oral)   Resp 16   Ht 5\' 9"  (1.753 m)   Wt 86.2 kg   SpO2 97%   BMI 28.06 kg/m   Physical Exam Vitals and nursing note reviewed.  Constitutional:      Appearance: He is not ill-appearing.  HENT:     Head: Normocephalic and atraumatic.  Eyes:     Conjunctiva/sclera: Conjunctivae normal.  Cardiovascular:     Rate and Rhythm: Normal rate and regular rhythm.     Pulses: Normal pulses.  Pulmonary:     Effort: Pulmonary effort is normal.     Breath sounds: Normal breath sounds. No wheezing, rhonchi or rales.  Musculoskeletal:     Comments: No overlying skin changes to right medial thigh. + TTP to this area. No tenderness to hip joint or knee. ROM intact to knee and hip however somewhat limited s/2 pain. No midline lumbar spinal TTP. Negative SLR bilaterally. Strength and sensation intact. 2+ DP pulse.   Skin:    General: Skin is warm and dry.      Coloration: Skin is not jaundiced.  Neurological:     Mental Status: He is alert.     ED Results / Procedures / Treatments   Labs (all labs ordered are listed, but only abnormal results are displayed) Labs Reviewed - No data to display  EKG None  Radiology No results found.  Procedures Procedures   Medications Ordered in ED Medications  ketorolac (TORADOL) 30 MG/ML injection 60 mg (60 mg Intramuscular  Given 08/07/20 1551)    ED Course  I have reviewed the triage vital signs and the nursing notes.  Pertinent labs & imaging results that were available during my care of the patient were reviewed by me and considered in my medical decision making (see chart for details).    MDM Rules/Calculators/A&P                          40 year old male presents to the ED today with complaint of right medial thigh pain after playing with his children.  Believes he may have pulled a muscle.  No relief with over-the-counter medications.  He was initially medically screened by myself, given he did not fall or injure this in any way and has been ambulating I do not feel he needs an x-ray at this time as I have very low suspicion for bony abnormality.  When he is brought back to room he was provided with Toradol with good relief.  Last kidney function in February within normal limits.  I do feel patient will benefit from a muscle relaxer as well as Voltaren gel over-the-counter.  He will be given a day to rest and advised to follow-up with PCP.  Information given for [place] and [facility] for primary care purposes.  Patient is in agreement with plan at this time and stable for discharge.   This note was prepared using Dragon voice recognition software and may include unintentional dictation errors due to the inherent limitations of voice recognition software.   Final Clinical Impression(s) / ED Diagnoses Final diagnoses:  Right leg pain    Rx / DC Orders ED Discharge Orders         Ordered     methocarbamol (ROBAXIN) 500 MG tablet  2 times daily        08/07/20 1649           Discharge Instructions     Please pick up muscle relaxer and take as prescribed. It is recommended that you take this at nighttime to help you sleep. DO NOT DRIVE WHILE ON THIS MEDICATION AS IT CAN MAKE YOU DROWSY. You can also apply OTC voltaren gel to the area to help with pain.   Follow up with [facility] for primary care needs.   Return to the ED for any worsening symptoms       Tanda Rockers, PA-C 08/07/20 1650    Derwood Kaplan, MD 08/08/20 1733

## 2020-08-07 NOTE — ED Triage Notes (Signed)
Emergency Medicine Provider Triage Evaluation Note  Devin [name] , a 40 y.o. male  was evaluated in triage.  Pt complains of gradual onset, constant, aching, right inner thigh pain for approximately 4 to 5 days.  He states that he was playing with his kids and feels like he may have overdone it.  He states that when he woke up the next morning he felt severe pain in his thigh that radiates around into his buttock area.  He states that at times will shoot up his leg.  He has been taking ibuprofen 400 mg every 4-6 hours as well as 500 mg Tylenol every 4-6 hours as needed for pain without much relief.  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  He was told that he would need to come to the ED and get a doctor's note prompting him to come to the ED today.  He did come by EMS as he does not have a ride currently.  Review of Systems  Positive: + right leg pain Negative: - weakness, numbness, tingling  Physical Exam  BP (!) 128/106 (BP Location: Right Arm)   Pulse 74   Temp 98.4 F (36.9 C) (Oral)   Resp 16   Ht 5\' 9"  (1.753 m)   Wt 86.2 kg   SpO2 97%   BMI 28.06 kg/m  Gen:   Awake, no distress   HEENT:  Atraumatic  Resp:  Normal effort  Cardiac:  Normal rate  Abd:   Nondistended, nontender  MSK:   Moves extremities without difficulty. + TTP to the right medial thigh. ROM intact to the right hip, knee, and ankle however limited s/2 pain. Strength 5/5. Sensation intact throughout. 2+ DP Pulse.  Neuro:  Speech clear   Medical Decision Making  Medically screening exam initiated at 2:49 PM.  Appropriate orders placed.  Devin [name] was informed that the remainder of the evaluation will be completed by another provider, this initial triage assessment does not replace that evaluation, and the importance of remaining in the ED until their evaluation is complete.  Clinical Impression  40 year old male who presents to the ED today with right  thigh pain after playing with his children.  Pain is worsened with moving.  No improvement with over-the-counter medications.  He denies falling on it or injuring it in any way.  I am not sure an x-ray is beneficial at this time.  I suspect he will need muscle relaxers for pain.  He is neurovascularly intact.  To be seen by provider once he can be placed back in her room.   30, PA-C 08/07/20 2214

## 2020-08-16 ENCOUNTER — Encounter (HOSPITAL_COMMUNITY): Payer: Self-pay | Admitting: Emergency Medicine

## 2020-08-16 ENCOUNTER — Emergency Department (HOSPITAL_COMMUNITY)
Admission: EM | Admit: 2020-08-16 | Discharge: 2020-08-16 | Disposition: A | Discharge status: Home / Self Care | Payer: Self-pay | Attending: Emergency Medicine | Admitting: Emergency Medicine

## 2020-08-16 ENCOUNTER — Other Ambulatory Visit: Payer: Self-pay

## 2020-08-16 DIAGNOSIS — Z87891 Personal history of nicotine dependence: Secondary | ICD-10-CM | POA: Insufficient documentation

## 2020-08-16 DIAGNOSIS — Z7951 Long term (current) use of inhaled steroids: Secondary | ICD-10-CM | POA: Insufficient documentation

## 2020-08-16 DIAGNOSIS — J441 Chronic obstructive pulmonary disease with (acute) exacerbation: Secondary | ICD-10-CM | POA: Insufficient documentation

## 2020-08-16 DIAGNOSIS — J4541 Moderate persistent asthma with (acute) exacerbation: Secondary | ICD-10-CM | POA: Insufficient documentation

## 2020-08-16 MED ORDER — ALBUTEROL SULFATE HFA 108 (90 BASE) MCG/ACT IN AERS
2.0000 | INHALATION_SPRAY | Freq: Once | RESPIRATORY_TRACT | Status: AC
  Administered 2020-08-16: 2 via RESPIRATORY_TRACT
  Filled 2020-08-16: qty 6.7

## 2020-08-16 MED ORDER — IPRATROPIUM BROMIDE HFA 17 MCG/ACT IN AERS
2.0000 | INHALATION_SPRAY | Freq: Once | RESPIRATORY_TRACT | Status: AC
  Administered 2020-08-16: 2 via RESPIRATORY_TRACT
  Filled 2020-08-16: qty 12.9

## 2020-08-16 NOTE — ED Provider Notes (Signed)
[facility]-EMERGENCY DEPT Provider Note   CSN: [id number] Arrival date & time: 08/16/20  1620     History Chief Complaint  Patient presents with  . Shortness of Breath    Devin [name] is a 40 y.o. male.  40 yo M with a chief complaint of an asthma exacerbation.  Going on for the past couple days.  Has run out of his medications at home.  Was given 10 mg of albuterol in route with EMS.  Feels much better now.  The history is provided by the patient.  Shortness of Breath Severity:  Severe Onset quality:  Sudden Duration:  2 days Timing:  Constant Progression:  Worsening Chronicity:  New Relieved by:  Nothing Worsened by:  Nothing Ineffective treatments:  None tried Associated symptoms: no abdominal pain, no chest pain, no fever, no headaches, no rash and no vomiting        Past Medical History:  Diagnosis Date  . ADHD (attention deficit hyperactivity disorder)   . Asthma   . Bipolar 1 disorder (HCC)   . GERD (gastroesophageal reflux disease)   . Transaminitis 07/17/2015    Patient Active Problem List   Diagnosis Date Noted  . Substance induced mood disorder (HCC) 03/07/2018  . Depression   . 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 Bipolar II disorder (HCC) 01/31/2018  . Hypoxia   . COPD exacerbation (HCC)   . Leukocytosis 11/04/2015  . Transaminitis 07/17/2015  . Tooth ache 07/17/2015  . Asthma with status asthmaticus 10/14/2011  . ADHD (attention deficit hyperactivity disorder) 10/14/2011    Past Surgical History:  Procedure Laterality Date  . TYMPANOPLASTY Left 1997       Family History  Problem Relation Age of Onset  . Coronary artery disease Father   . Heart attack Father   . Heart disease Father   . Heart failure Mother   . Hyperlipidemia Mother   . Hypertension Mother   . Migraines Mother   . COPD Mother   . Diabetes Maternal Aunt   . Asthma Maternal Grandfather   .  Asthma Paternal Grandfather     Social History   Tobacco Use  . Smoking status: Former Smoker    Packs/day: 0.50    Types: Cigarettes    Quit date: 09/01/2018    Years since quitting: 1.9  . Smokeless tobacco: Never Used  Vaping Use  . Vaping Use: Never used  Substance Use Topics  . Alcohol use: Yes    Alcohol/week: 0.0 standard drinks    Comment: occ  . Drug use: Yes    Types: Marijuana    Comment:                      Home Medications Prior to Admission medications   Medication Sig Start Date End Date Taking? Authorizing Provider  albuterol (VENTOLIN HFA) 108 (90 Base) MCG/ACT inhaler Inhale 1-2 puffs into the lungs every 6 (six) hours as needed for wheezing or shortness of breath. 06/09/20   Jeannie Fend, PA-C  fluticasone (FLOVENT HFA) 44 MCG/ACT inhaler Inhale 2 puffs into the lungs 2 (two) times daily. For asthma Patient not taking: Reported on 10/17/2018 02/09/18   Armandina Stammer I, NP  gabapentin (NEURONTIN) 400 MG capsule Take 1 capsule (400 mg total) by mouth 3 (three) times daily. For agitation Patient not taking: Reported on 04/08/2018 02/09/18   Armandina Stammer I, NP  hydrOXYzine (ATARAX/VISTARIL) 25 MG tablet Take  1 tablet (25 mg) by mouth three times daily & 2 tablets (50 mg) at bedtime for anxiety/sleep Patient not taking: Reported on 04/08/2018 02/09/18   Armandina Stammer I, NP  ibuprofen (ADVIL) 200 MG tablet Take 2 tablets (400 mg total) by mouth 2 (two) times daily as needed for moderate pain. 06/09/20   Jeannie Fend, PA-C  methocarbamol (ROBAXIN) 500 MG tablet Take 1 tablet (500 mg total) by mouth 2 (two) times daily. 08/07/20   Hyman Hopes, Margaux, PA-C  mirtazapine (REMERON) 30 MG tablet Take 1 tablet (30 mg total) by mouth at bedtime. For depression Patient not taking: Reported on 04/08/2018 02/09/18   Armandina Stammer I, NP  OLANZapine (ZYPREXA) 2.5 MG tablet Take 1 tablet (2.5 mg total) by mouth at bedtime. For mood control Patient not taking: Reported on 04/08/2018  02/09/18   Armandina Stammer I, NP  ondansetron (ZOFRAN) 4 MG tablet Take 1 tablet (4 mg total) by mouth every 6 (six) hours as needed for nausea or vomiting. 10/28/18   Terrilee Files, MD  pantoprazole (PROTONIX) 40 MG tablet Take 1 tablet (40 mg total) by mouth daily. For acid reflux 10/28/18   Terrilee Files, MD  promethazine (PHENERGAN) 25 MG tablet Take 1 tablet (25 mg total) by mouth every 6 (six) hours as needed for nausea. Patient not taking: Reported on 07/14/2018 06/04/18   Benjiman Core, MD    Allergies    Augmentin [amoxicillin-pot clavulanate] and Other  Review of Systems   Review of Systems  Constitutional: Negative for chills and fever.  HENT: Negative for congestion and facial swelling.   Eyes: Negative for discharge and visual disturbance.  Respiratory: Positive for shortness of breath.   Cardiovascular: Negative for chest pain and palpitations.  Gastrointestinal: Negative for abdominal pain, diarrhea and vomiting.  Musculoskeletal: Negative for arthralgias and myalgias.  Skin: Negative for color change and rash.  Neurological: Negative for tremors, syncope and headaches.  Psychiatric/Behavioral: Negative for confusion and dysphoric mood.    Physical Exam Updated Vital Signs BP (!) 142/96   Pulse 74   Temp 98.4 F (36.9 C)   Resp 16   SpO2 98%   Physical Exam Vitals and nursing note reviewed.  Constitutional:      Appearance: He is well-developed.  HENT:     Head: Normocephalic and atraumatic.  Eyes:     Pupils: Pupils are equal, round, and reactive to light.  Neck:     Vascular: No JVD.  Cardiovascular:     Rate and Rhythm: Normal rate and regular rhythm.     Heart sounds: No murmur heard. No friction rub. No gallop.   Pulmonary:     Effort: No respiratory distress.     Breath sounds: No wheezing or rhonchi.  Abdominal:     General: There is no distension.     Tenderness: There is no guarding or rebound.  Musculoskeletal:        General: Normal  range of motion.     Cervical back: Normal range of motion and neck supple.  Skin:    Coloration: Skin is not pale.     Findings: No rash.  Neurological:     Mental Status: He is alert and oriented to person, place, and time.  Psychiatric:        Behavior: Behavior normal.     ED Results / Procedures / Treatments   Labs (all labs ordered are listed, but only abnormal results are displayed) Labs Reviewed - No data to  display  EKG None  Radiology No results found.  Procedures Procedures   Medications Ordered in ED Medications  albuterol (VENTOLIN HFA) 108 (90 Base) MCG/ACT inhaler 2 puff (has no administration in time range)  ipratropium (ATROVENT HFA) inhaler 2 puff (has no administration in time range)    ED Course  I have reviewed the triage vital signs and the nursing notes.  Pertinent labs & imaging results that were available during my care of the patient were reviewed by me and considered in my medical decision making (see chart for details).    MDM Rules/Calculators/A&P                          40 yo M with a cc of asthma exacerbation.  Patient is clear lungs on my exam after getting 10 mg of albuterol and Solu-Medrol.  We will give 2 puffs albuterol and Atrovent.  Will let him take him home.  PCP follow-up.  5:08 PM:  I have discussed the diagnosis/risks/treatment options with the patient and believe the pt to be eligible for discharge home to follow-up with PCP. We also discussed returning to the ED immediately if new or worsening sx occur. We discussed the sx which are most concerning (e.g., sudden worsening pain, fever, inability to tolerate by mouth) that necessitate immediate return. Medications administered to the patient during their visit and any new prescriptions provided to the patient are listed below.  Medications given during this visit Medications  albuterol (VENTOLIN HFA) 108 (90 Base) MCG/ACT inhaler 2 puff (has no administration in time range)   ipratropium (ATROVENT HFA) inhaler 2 puff (has no administration in time range)     The patient appears reasonably screen and/or stabilized for discharge and I doubt any other medical condition or other [facility] requiring further screening, evaluation, or treatment in the ED at this time prior to discharge.   Final Clinical Impression(s) / ED Diagnoses Final diagnoses:  Moderate persistent asthma with exacerbation    Rx / DC Orders ED Discharge Orders    None       Melene Plan, DO 08/16/20 1708

## 2020-08-16 NOTE — Discharge Instructions (Signed)
Use your inhaler every 4 hours(6 puffs) while awake, return for sudden worsening shortness of breath, or if you need to use your inhaler more often.  ° °

## 2020-08-16 NOTE — ED Triage Notes (Signed)
BIBA Per EMS: Pt Coming from home with an asthma attack that has lasted all day. Decreased lung sounds and RR in 30s upon EMS arrival.  Albuterol tx given ; 10mg  albuterol given ; 125 solumedrol given by EMS 20 G L hand  98% RA All vital WDL

## 2020-08-25 ENCOUNTER — Emergency Department (HOSPITAL_COMMUNITY): Payer: Self-pay

## 2020-08-25 ENCOUNTER — Encounter (HOSPITAL_COMMUNITY): Payer: Self-pay

## 2020-08-25 ENCOUNTER — Emergency Department (HOSPITAL_COMMUNITY)
Admission: EM | Admit: 2020-08-25 | Discharge: 2020-08-25 | Disposition: A | Discharge status: Home / Self Care | Payer: Self-pay | Attending: Emergency Medicine | Admitting: Emergency Medicine

## 2020-08-25 DIAGNOSIS — J4541 Moderate persistent asthma with (acute) exacerbation: Secondary | ICD-10-CM | POA: Insufficient documentation

## 2020-08-25 DIAGNOSIS — Z87891 Personal history of nicotine dependence: Secondary | ICD-10-CM | POA: Insufficient documentation

## 2020-08-25 DIAGNOSIS — Z7951 Long term (current) use of inhaled steroids: Secondary | ICD-10-CM | POA: Insufficient documentation

## 2020-08-25 DIAGNOSIS — Z20822 Contact with and (suspected) exposure to covid-19: Secondary | ICD-10-CM | POA: Insufficient documentation

## 2020-08-25 LAB — RESP PANEL BY RT-PCR (FLU A&B, COVID) ARPGX2
Influenza A by PCR: NEGATIVE
Influenza B by PCR: NEGATIVE
SARS Coronavirus 2 by RT PCR: NEGATIVE

## 2020-08-25 MED ORDER — ALBUTEROL SULFATE HFA 108 (90 BASE) MCG/ACT IN AERS
6.0000 | INHALATION_SPRAY | Freq: Once | RESPIRATORY_TRACT | Status: AC
  Administered 2020-08-25: 6 via RESPIRATORY_TRACT
  Filled 2020-08-25: qty 6.7

## 2020-08-25 MED ORDER — MAGNESIUM SULFATE 2 GM/50ML IV SOLN: 2.0000 g | Freq: Once | INTRAVENOUS | Status: DC

## 2020-08-25 MED ORDER — PREDNISONE 20 MG PO TABS: 40.0000 mg | ORAL_TABLET | Freq: Every day | ORAL | 0 refills | Status: AC

## 2020-08-25 MED ORDER — AEROCHAMBER Z-STAT PLUS/MEDIUM MISC
1.0000 | Freq: Once | Status: AC
  Administered 2020-08-25: 1
  Filled 2020-08-25: qty 1

## 2020-08-25 MED ORDER — ALBUTEROL SULFATE HFA 108 (90 BASE) MCG/ACT IN AERS
6.0000 | INHALATION_SPRAY | Freq: Once | RESPIRATORY_TRACT | Status: AC
  Administered 2020-08-25: 6 via RESPIRATORY_TRACT

## 2020-08-25 MED ORDER — ALBUTEROL SULFATE HFA 108 (90 BASE) MCG/ACT IN AERS: 2.0000 | INHALATION_SPRAY | RESPIRATORY_TRACT | 2 refills | Status: DC | PRN

## 2020-08-25 MED ORDER — SODIUM CHLORIDE 0.9 % IV BOLUS
500.0000 mL | Freq: Once | INTRAVENOUS | Status: AC
  Administered 2020-08-25: 500 mL via INTRAVENOUS

## 2020-08-25 NOTE — ED Triage Notes (Signed)
Patient presents with wheezing which started 4 hrs ago. He used his inhaler, but ran out before giving a complete dose. He went to work but symptoms did not improve,. He walked home and called EMS. EMS noted right side wheezing and administered 5 albuterol with Atrovent and 125 of solumedrol. He noticed some improvement, but some wheezing was still noted.

## 2020-08-25 NOTE — Discharge Instructions (Addendum)
Please start taking Allegra, Claritin, or Zyrtec.  You should be taking 1 of these every day. Additionally please start using a nasal steroid spray such as Flonase or Nasacort.  Please do this for the entire pollen season. Given that you got steroids today have given you a prescription for an additional 4 days of steroids.  Please do not start these until tomorrow. I have sent you a prescription for albuterol with multiple refills. Additionally I have given you the information for the wellness clinic.  They will see you even with out insurance and may be able to help you with getting inhalers.   While in the ED your blood pressure was high.  Please follow up with your primary care doctor or the wellness clinic for repeat evaluation as you may need medication.  High blood pressure can cause long term, potentially serious, damage if left untreated.

## 2020-08-25 NOTE — ED Provider Notes (Signed)
[facility]-EMERGENCY DEPT Provider Note   CSN: [id number] Arrival date & time: 08/25/20  1148     History Chief Complaint  Patient presents with  . Shortness of Breath    Devin [name] is a 40 y.o. male with a past medical history of bipolar, ADHD, asthma, who presents today for evaluation of asthma exacerbation. He was seen in the ER 9 days ago for similar. He states that he felt fine when he went to bed last night, today he woke up and went to work.  At work he was short of breath and was sent home.  He used his last 3 puffs of his albuterol inhaler.  He states that he is vaccinated and boosters and had a flu shot also. He states that he has had his asthma for many years and this feels like his normal asthma exacerbations. He denies any fevers.  No leg swelling, rashes.  With EMS he was given 5 mg of albuterol, Atrovent, 125 mg of Solu-Medrol. He states that he feels better now however still feels short of breath.    HPI     Past Medical History:  Diagnosis Date  . ADHD (attention deficit hyperactivity disorder)   . Asthma   . Bipolar 1 disorder (HCC)   . GERD (gastroesophageal reflux disease)   . Transaminitis 07/17/2015    Patient Active Problem List   Diagnosis Date Noted  . Substance induced mood disorder (HCC) 03/07/2018  . Depression   . 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 Bipolar II disorder (HCC) 01/31/2018  . Hypoxia   . COPD exacerbation (HCC)   . Leukocytosis 11/04/2015  . Transaminitis 07/17/2015  . Tooth ache 07/17/2015  . Asthma with status asthmaticus 10/14/2011  . ADHD (attention deficit hyperactivity disorder) 10/14/2011    Past Surgical History:  Procedure Laterality Date  . TYMPANOPLASTY Left 1997       Family History  Problem Relation Age of Onset  . Coronary artery disease Father   . Heart attack Father   . Heart disease Father   . Heart failure Mother   .  Hyperlipidemia Mother   . Hypertension Mother   . Migraines Mother   . COPD Mother   . Diabetes Maternal Aunt   . Asthma Maternal Grandfather   . Asthma Paternal Grandfather     Social History   Tobacco Use  . Smoking status: Former Smoker    Packs/day: 0.50    Types: Cigarettes    Quit date: 09/01/2018    Years since quitting: 1.9  . Smokeless tobacco: Never Used  Vaping Use  . Vaping Use: Never used  Substance Use Topics  . Alcohol use: Yes    Alcohol/week: 0.0 standard drinks    Comment: occ  . Drug use: Yes    Types: Marijuana    Comment:                      Home Medications Prior to Admission medications   Medication Sig Start Date End Date Taking? Authorizing Provider  albuterol (VENTOLIN HFA) 108 (90 Base) MCG/ACT inhaler Inhale 2 puffs into the lungs every 4 (four) hours as needed for wheezing or shortness of breath. 08/25/20  Yes Cristina Gong, PA-C  predniSONE (DELTASONE) 20 MG tablet Take 2 tablets (40 mg total) by mouth daily for 4 days. 08/25/20 08/29/20 Yes Cristina Gong, PA-C  fluticasone (FLOVENT HFA) 44 MCG/ACT inhaler Inhale 2 puffs  into the lungs 2 (two) times daily. For asthma Patient not taking: Reported on 10/17/2018 02/09/18   Armandina StammerNwoko, Agnes I, NP  gabapentin (NEURONTIN) 400 MG capsule Take 1 capsule (400 mg total) by mouth 3 (three) times daily. For agitation Patient not taking: Reported on 04/08/2018 02/09/18   Armandina StammerNwoko, Agnes I, NP  hydrOXYzine (ATARAX/VISTARIL) 25 MG tablet Take 1 tablet (25 mg) by mouth three times daily & 2 tablets (50 mg) at bedtime for anxiety/sleep Patient not taking: Reported on 04/08/2018 02/09/18   Armandina StammerNwoko, Agnes I, NP  ibuprofen (ADVIL) 200 MG tablet Take 2 tablets (400 mg total) by mouth 2 (two) times daily as needed for moderate pain. 06/09/20   Jeannie FendMurphy, Laura A, PA-C  methocarbamol (ROBAXIN) 500 MG tablet Take 1 tablet (500 mg total) by mouth 2 (two) times daily. 08/07/20   Hyman HopesVenter, Margaux, PA-C  mirtazapine (REMERON) 30  MG tablet Take 1 tablet (30 mg total) by mouth at bedtime. For depression Patient not taking: Reported on 04/08/2018 02/09/18   Armandina StammerNwoko, Agnes I, NP  OLANZapine (ZYPREXA) 2.5 MG tablet Take 1 tablet (2.5 mg total) by mouth at bedtime. For mood control Patient not taking: Reported on 04/08/2018 02/09/18   Armandina StammerNwoko, Agnes I, NP  ondansetron (ZOFRAN) 4 MG tablet Take 1 tablet (4 mg total) by mouth every 6 (six) hours as needed for nausea or vomiting. 10/28/18   Terrilee FilesButler, Michael C, MD  pantoprazole (PROTONIX) 40 MG tablet Take 1 tablet (40 mg total) by mouth daily. For acid reflux 10/28/18   Terrilee FilesButler, Michael C, MD  promethazine (PHENERGAN) 25 MG tablet Take 1 tablet (25 mg total) by mouth every 6 (six) hours as needed for nausea. Patient not taking: Reported on 07/14/2018 06/04/18   Benjiman CorePickering, Nathan, MD    Allergies    Augmentin [amoxicillin-pot clavulanate] and Other  Review of Systems   Review of Systems  Constitutional: Negative for chills and fever.  Respiratory: Positive for cough, chest tightness and shortness of breath.   Cardiovascular: Negative for chest pain, palpitations and leg swelling.  Gastrointestinal: Negative for abdominal pain.  Genitourinary: Negative for dysuria.  Musculoskeletal: Negative for back pain and neck pain.  Neurological: Negative for weakness and headaches.  All other systems reviewed and are negative.   Physical Exam Updated Vital Signs BP (!) 143/104   Pulse 89   Temp 98.2 F (36.8 C) (Oral)   Resp (!) 24   SpO2 97%   Physical Exam Vitals and nursing note reviewed.  Constitutional:      General: He is not in acute distress.    Appearance: He is not diaphoretic.  HENT:     Head: Normocephalic and atraumatic.     Mouth/Throat:     Mouth: Mucous membranes are moist.  Eyes:     General: No scleral icterus.       Right eye: No discharge.        Left eye: No discharge.     Conjunctiva/sclera: Conjunctivae normal.  Cardiovascular:     Rate and Rhythm:  Normal rate and regular rhythm.  Pulmonary:     Effort: Pulmonary effort is normal. No respiratory distress.     Breath sounds: No stridor. Examination of the right-upper field reveals decreased breath sounds. Examination of the left-upper field reveals decreased breath sounds. Examination of the right-middle field reveals decreased breath sounds. Examination of the left-middle field reveals decreased breath sounds and wheezing. Examination of the right-lower field reveals decreased breath sounds and wheezing. Examination of the left-lower  field reveals decreased breath sounds and wheezing. Decreased breath sounds and wheezing present.  Chest:     Chest wall: No tenderness.  Abdominal:     General: There is no distension.  Musculoskeletal:        General: No deformity.     Cervical back: Normal range of motion.  Skin:    General: Skin is warm and dry.  Neurological:     Mental Status: He is alert.     Motor: No abnormal muscle tone.  Psychiatric:        Mood and Affect: Mood normal.        Behavior: Behavior normal.     ED Results / Procedures / Treatments   Labs (all labs ordered are listed, but only abnormal results are displayed) Labs Reviewed  RESP PANEL BY RT-PCR (FLU A&B, COVID) [id number]    EKG EKG Interpretation  Date/Time:  Friday Aug 25 2020 12:25:36 EDT Ventricular Rate:  70 PR Interval:  169 QRS Duration: 95 QT Interval:  405 QTC Calculation: 437 R Axis:   5 Text Interpretation: Sinus rhythm Normal ECG Confirmed by Linwood Dibbles [phone]) on 08/25/2020 12:30:32 PM   Radiology DG Chest 2 View  Result Date: 08/25/2020 CLINICAL DATA:  Asthma, wheezing for 4 days, former smoker EXAM: CHEST - 2 VIEW COMPARISON:  06/09/2020 FINDINGS: Normal heart size, mediastinal contours, and pulmonary vascularity. Mild chronic peribronchial thickening. No acute infiltrate, pleural effusion, or pneumothorax. Osseous structures unremarkable. IMPRESSION: Mild chronic peribronchial thickening  which could reflect chronic bronchitis or asthma. No acute infiltrate. Electronically Signed   By: Ulyses Southward M.D.   On: 08/25/2020 12:40    Procedures Procedures   Medications Ordered in ED Medications  albuterol (VENTOLIN HFA) 108 (90 Base) MCG/ACT inhaler 6 puff (6 puffs Inhalation Given 08/25/20 1307)  aerochamber Z-Stat Plus/medium 1 each (1 each Other Given 08/25/20 1307)  sodium chloride 0.9 % bolus 500 mL (0 mLs Intravenous Stopped 08/25/20 1357)  albuterol (VENTOLIN HFA) 108 (90 Base) MCG/ACT inhaler 6 puff (6 puffs Inhalation Given 08/25/20 1413)    ED Course  I have reviewed the triage vital signs and the nursing notes.  Pertinent labs & imaging results that were available during my care of the patient were reviewed by me and considered in my medical decision making (see chart for details).  Clinical Course as of 08/25/20 1450  Fri Aug 25, 2020  1433 Patient is reevaluated, he feels like his breathing is back to normal and states he is ready to go home at this time. [EH]    Clinical Course User Index [EH] Norman Clay   MDM Rules/Calculators/A&P                         Patient is a 40 year old man who presents today for evaluation of shortness of breath.  He states that he got short of breath shortly prior to arrival and he used of his inhaler. With EMS he was given albuterol, Atrovent, and IV Solu-Medrol. Here chest x-ray is reassuring, COVID test and flu test is negative.  He was treated with 2 rounds of 6 puffs of albuterol after which he felt like his breathing was back to his normal and stated he was ready for discharge home.  He is 97 to 98% on room air with good waveform while I was in the room. He is given prescription for continued steroid burst, along with prescription for albuterol inhaler.  It appears  he has multiple visits into the ER requesting albuterol inhaler having run out.  He is provided with information for the wellness center along with  prescription with multiple refills.  Return precautions were discussed with patient who states their understanding.  At the time of discharge patient denied any unaddressed complaints or concerns.  Patient is agreeable for discharge home.  Note: Portions of this report may have been transcribed using voice recognition software. Every effort was made to ensure accuracy; however, inadvertent computerized transcription errors may be present  Final Clinical Impression(s) / ED Diagnoses Final diagnoses:  Moderate persistent asthma with exacerbation    Rx / DC Orders ED Discharge Orders         Ordered    predniSONE (DELTASONE) 20 MG tablet  Daily        08/25/20 1439    albuterol (VENTOLIN HFA) 108 (90 Base) MCG/ACT inhaler  Every 4 hours PRN        08/25/20 1439           Cristina Gong, Cordelia Poche 08/25/20 1450    Linwood Dibbles, MD 08/26/20 [phone]

## 2020-10-02 ENCOUNTER — Emergency Department (HOSPITAL_COMMUNITY): Payer: Self-pay

## 2020-10-02 ENCOUNTER — Emergency Department (HOSPITAL_COMMUNITY)
Admission: EM | Admit: 2020-10-02 | Discharge: 2020-10-02 | Disposition: A | Discharge status: Home / Self Care | Payer: Self-pay | Attending: Emergency Medicine | Admitting: Emergency Medicine

## 2020-10-02 ENCOUNTER — Encounter (HOSPITAL_COMMUNITY): Payer: Self-pay | Admitting: *Deleted

## 2020-10-02 DIAGNOSIS — Z87891 Personal history of nicotine dependence: Secondary | ICD-10-CM | POA: Insufficient documentation

## 2020-10-02 DIAGNOSIS — J449 Chronic obstructive pulmonary disease, unspecified: Secondary | ICD-10-CM | POA: Insufficient documentation

## 2020-10-02 DIAGNOSIS — J45901 Unspecified asthma with (acute) exacerbation: Secondary | ICD-10-CM | POA: Insufficient documentation

## 2020-10-02 MED ORDER — ALBUTEROL SULFATE HFA 108 (90 BASE) MCG/ACT IN AERS
2.0000 | INHALATION_SPRAY | Freq: Once | RESPIRATORY_TRACT | Status: AC
  Administered 2020-10-02: 2 via RESPIRATORY_TRACT
  Filled 2020-10-02: qty 6.7

## 2020-10-02 MED ORDER — FLUTICASONE PROPIONATE HFA 44 MCG/ACT IN AERO: 2.0000 | INHALATION_SPRAY | Freq: Two times a day (BID) | RESPIRATORY_TRACT | 1 refills | Status: DC

## 2020-10-02 MED ORDER — PREDNISONE 50 MG PO TABS: 50.0000 mg | ORAL_TABLET | Freq: Every day | ORAL | 0 refills | Status: DC

## 2020-10-02 NOTE — Discharge Instructions (Addendum)
We saw you in the ER for your asthma related complains.  Please take albuterol as needed every 4 hours. Please take the medications prescribed. Please refrain from smoking or smoke exposure. Please see the [facility] wellness team as requested.

## 2020-10-02 NOTE — ED Triage Notes (Addendum)
Per EMS, pt complains of SOB since last night, ran out of albuterol last night. Has been getting worse since last night. Exertional SOB and wheezes throughout w/ EMS. Pt received duoneb and solumedrol, reports some improvement w/ treatment. Hx COPD and asthma  HR 70 BP 138/98 RR 20 O2 98% RA, 100% on 2L [place]

## 2020-10-02 NOTE — ED Provider Notes (Signed)
[facility]-EMERGENCY DEPT Provider Note   CSN: [id number] Arrival date & time: 10/02/20  8937     History Chief Complaint  Patient presents with   Shortness of Breath    Devin [name] is a 40 y.o. male.  HPI     SUBJECTIVE:  Devin [name] is a 40 y.o. male seen urgently with exacerbation of asthma for 2 days. Wheezing is described as mild-moderate. Associated symptoms:dry cough. Current asthma medications: not using meds compliantly.  Patient denies any nausea, vomiting, fevers, chills, sick exposures, body aches.  He has not taken Proventil for months and just run out of his albuterol.  He thinks the humidity triggered his asthma exacerbation.  He does not have a PCP or insurance.    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   Bipolar II disorder (HCC) 01/31/2018   Hypoxia    COPD exacerbation (HCC)    Leukocytosis 11/04/2015   Transaminitis 07/17/2015   Tooth ache 07/17/2015   Asthma with status asthmaticus 10/14/2011   ADHD (attention deficit hyperactivity disorder) 10/14/2011    Past Surgical History:  Procedure Laterality Date   TYMPANOPLASTY Left 1997       Family History  Problem Relation Age of Onset   Coronary artery disease Father    Heart attack Father    Heart disease Father    Heart failure Mother    Hyperlipidemia Mother    Hypertension Mother    Migraines Mother    COPD Mother    Diabetes Maternal Aunt    Asthma Maternal Grandfather    Asthma Paternal Grandfather     Social History   Tobacco Use   Smoking status: Former    Packs/day: 0.50    Pack years:  0.00    Types: Cigarettes    Quit date: 09/01/2018    Years since quitting: 2.0   Smokeless tobacco: Never  Vaping Use   Vaping Use: Never used  Substance Use Topics   Alcohol use: Yes    Alcohol/week: 0.0 standard drinks    Comment: occ   Drug use: Yes    Types: Marijuana    Comment:                      Home Medications Prior to Admission medications   Medication Sig Start Date End Date Taking? Authorizing Provider  predniSONE (DELTASONE) 50 MG tablet Take 1 tablet (50 mg total) by mouth daily. 10/02/20  Yes Derwood Kaplan, MD  albuterol (VENTOLIN HFA) 108 (90 Base) MCG/ACT inhaler Inhale 2 puffs into the lungs every 4 (four) hours as needed for wheezing or shortness of breath. 08/25/20   Cristina Gong, PA-C  fluticasone (FLOVENT HFA) 44 MCG/ACT inhaler Inhale 2 puffs into the lungs 2 (two) times daily. For asthma 10/02/20   Derwood Kaplan, MD  gabapentin (NEURONTIN) 400 MG capsule Take 1 capsule (400 mg total) by mouth 3 (three) times daily. For agitation Patient not taking: Reported on 04/08/2018 02/09/18   Armandina Stammer I, NP  hydrOXYzine (ATARAX/VISTARIL) 25 MG tablet Take 1 tablet (25 mg) by mouth three times daily &  2 tablets (50 mg) at bedtime for anxiety/sleep Patient not taking: Reported on 04/08/2018 02/09/18   Armandina Stammer I, NP  ibuprofen (ADVIL) 200 MG tablet Take 2 tablets (400 mg total) by mouth 2 (two) times daily as needed for moderate pain. 06/09/20   Jeannie Fend, PA-C  methocarbamol (ROBAXIN) 500 MG tablet Take 1 tablet (500 mg total) by mouth 2 (two) times daily. 08/07/20   Hyman Hopes, Margaux, PA-C  mirtazapine (REMERON) 30 MG tablet Take 1 tablet (30 mg total) by mouth at bedtime. For depression Patient not taking: Reported on 04/08/2018 02/09/18   Armandina Stammer I, NP  OLANZapine (ZYPREXA) 2.5 MG tablet Take 1 tablet (2.5 mg total) by mouth at bedtime. For mood control Patient not taking: Reported on 04/08/2018 02/09/18   Armandina Stammer I, NP  ondansetron  (ZOFRAN) 4 MG tablet Take 1 tablet (4 mg total) by mouth every 6 (six) hours as needed for nausea or vomiting. 10/28/18   Terrilee Files, MD  pantoprazole (PROTONIX) 40 MG tablet Take 1 tablet (40 mg total) by mouth daily. For acid reflux 10/28/18   Terrilee Files, MD  promethazine (PHENERGAN) 25 MG tablet Take 1 tablet (25 mg total) by mouth every 6 (six) hours as needed for nausea. Patient not taking: Reported on 07/14/2018 06/04/18   Benjiman Core, MD    Allergies    Augmentin [amoxicillin-pot clavulanate] and Other  Review of Systems   Review of Systems  Constitutional:  Positive for activity change.  Respiratory:  Positive for cough and wheezing.    Physical Exam Updated Vital Signs BP (!) 134/98   Pulse 63   Temp 98.2 F (36.8 C) (Oral)   Resp 19   SpO2 98%   Physical Exam Vitals and nursing note reviewed.  Constitutional:      Appearance: He is well-developed.  HENT:     Head: Atraumatic.  Cardiovascular:     Rate and Rhythm: Normal rate.  Pulmonary:     Effort: Pulmonary effort is normal.     Breath sounds: Examination of the right-lower field reveals wheezing. Examination of the left-lower field reveals wheezing. Wheezing present.  Musculoskeletal:     Cervical back: Neck supple.  Skin:    General: Skin is warm.  Neurological:     Mental Status: He is alert and oriented to person, place, and time.    ED Results / Procedures / Treatments   Labs (all labs ordered are listed, but only abnormal results are displayed) Labs Reviewed - No data to display  EKG None  Radiology DG Chest 2 View  Result Date: 10/02/2020 CLINICAL DATA:  Cough, wheezing and shortness of breath. EXAM: CHEST - 2 VIEW COMPARISON:  08/25/2020. FINDINGS: Trachea is midline. Heart size normal. Lungs are clear and do not appear hyperinflated. No pleural fluid. IMPRESSION: No acute findings. Electronically Signed   By: Leanna Battles M.D.   On: 10/02/2020 11:07    Procedures Procedures    Medications Ordered in ED Medications  albuterol (VENTOLIN HFA) 108 (90 Base) MCG/ACT inhaler 2 puff (has no administration in time range)    ED Course  I have reviewed the triage vital signs and the nursing notes.  Pertinent labs & imaging results that were available during my care of the patient were reviewed by me and considered in my medical decision making (see chart for details).    MDM Rules/Calculators/A&P  Pt comes in with cc of cough, shortness of breath.  He ran out of his albuterol rescue inhaler 2 days ago.  Patient has poorly controlled asthma because of lack of access and lack of insurance.  He states that he has not taken Proventil in months and has been taking rescue inhaler as needed, but he ran out of it 2 days ago and the symptoms have worsened over the last few days because of humidity.  No other URI-like symptoms, patient denies any n/v/f/c. Symptoms are mild. Will give outpatient resource. Will give oral pred.  Final Clinical Impression(s) / ED Diagnoses Final diagnoses:  Moderate asthma with acute exacerbation, unspecified whether persistent    Rx / DC Orders ED Discharge Orders          Ordered    fluticasone (FLOVENT HFA) 44 MCG/ACT inhaler  2 times daily        10/02/20 1120    predniSONE (DELTASONE) 50 MG tablet  Daily        10/02/20 1120             Derwood Kaplan, MD 10/02/20 1137

## 2020-11-06 ENCOUNTER — Encounter (HOSPITAL_COMMUNITY): Payer: Self-pay | Admitting: Student

## 2020-11-06 ENCOUNTER — Emergency Department (HOSPITAL_COMMUNITY)
Admission: EM | Admit: 2020-11-06 | Discharge: 2020-11-06 | Disposition: A | Discharge status: Home / Self Care | Payer: Self-pay | Attending: Emergency Medicine | Admitting: Emergency Medicine

## 2020-11-06 ENCOUNTER — Other Ambulatory Visit: Payer: Self-pay

## 2020-11-06 DIAGNOSIS — Z87891 Personal history of nicotine dependence: Secondary | ICD-10-CM | POA: Insufficient documentation

## 2020-11-06 DIAGNOSIS — R000 Tachycardia, unspecified: Secondary | ICD-10-CM | POA: Insufficient documentation

## 2020-11-06 DIAGNOSIS — J4541 Moderate persistent asthma with (acute) exacerbation: Secondary | ICD-10-CM | POA: Insufficient documentation

## 2020-11-06 DIAGNOSIS — Z92240 Personal history of inhaled steroid therapy: Secondary | ICD-10-CM | POA: Insufficient documentation

## 2020-11-06 MED ORDER — PREDNISONE 20 MG PO TABS: 60.0000 mg | ORAL_TABLET | Freq: Every day | ORAL | 0 refills | Status: DC

## 2020-11-06 MED ORDER — ALBUTEROL SULFATE HFA 108 (90 BASE) MCG/ACT IN AERS
2.0000 | INHALATION_SPRAY | Freq: Once | RESPIRATORY_TRACT | Status: AC
  Administered 2020-11-06: 2 via RESPIRATORY_TRACT
  Filled 2020-11-06: qty 6.7

## 2020-11-06 NOTE — ED Triage Notes (Signed)
Patient BIB [facility] from home c/o asthma exacerbation.  No fevers, patient ran out of his medications 2 days ago.  SOB started yesterday and got worse today.  Patient staying in a motel.  EMS gave 10mg  albuterol, 0.5 atrovent 125mg  solumedrol IV, 2g magnesium. EMS placed 22g left wrist.  138/101 100-HR 28-32-RR 99% room air

## 2020-11-06 NOTE — ED Provider Notes (Signed)
[facility]-EMERGENCY DEPT Provider Note   CSN: [id number] Arrival date & time: 11/06/20  1426     History Chief Complaint  Patient presents with   Asthma   Shortness of Breath    Devin [name] is a 40 y.o. male.  He has a history of asthma.  Complaining of an exacerbation of asthma that started yesterday.  He said he had his kids with him and did not have any more of his medications so he waited until today to be seen.  Short of breath chest tightness.  Typical of his exacerbation.  He is out of albuterol and Atrovent inhaler.  No fevers or chills.  The history is provided by the patient.  Asthma This is a chronic problem. The current episode started yesterday. The problem occurs constantly. The problem has not changed since onset.Associated symptoms include shortness of breath. Pertinent negatives include no chest pain, no abdominal pain and no headaches. The symptoms are aggravated by exertion. Nothing relieves the symptoms. He has tried rest for the symptoms. The treatment provided no relief.  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   Bipolar II disorder (HCC) 01/31/2018   Hypoxia    COPD exacerbation (HCC)    Leukocytosis 11/04/2015   Transaminitis 07/17/2015   Tooth ache 07/17/2015   Asthma with status asthmaticus 10/14/2011   ADHD (attention deficit hyperactivity disorder) 10/14/2011    Past Surgical History:  Procedure Laterality Date    TYMPANOPLASTY Left 1997       Family History  Problem Relation Age of Onset   Coronary artery disease Father    Heart attack Father    Heart disease Father    Heart failure Mother    Hyperlipidemia Mother    Hypertension Mother    Migraines Mother    COPD Mother    Diabetes Maternal Aunt    Asthma Maternal Grandfather    Asthma Paternal Grandfather     Social History   Tobacco Use   Smoking status: Former    Packs/day: 0.50    Types: Cigarettes    Quit date: 09/01/2018    Years since quitting: 2.1   Smokeless tobacco: Never  Vaping Use   Vaping Use: Never used  Substance Use Topics   Alcohol use: Yes    Alcohol/week: 0.0 standard drinks    Comment: occ   Drug use: Yes    Types: Marijuana    Comment:                      Home Medications Prior to Admission medications   Medication Sig Start Date End Date Taking? Authorizing Provider  albuterol (VENTOLIN HFA) 108 (90 Base) MCG/ACT inhaler Inhale 2 puffs into the lungs every 4 (four) hours as needed for wheezing or shortness of breath. 08/25/20   Cristina Gong, PA-C  fluticasone (FLOVENT HFA) 44 MCG/ACT inhaler Inhale 2 puffs into the lungs 2 (two) times  daily. For asthma 10/02/20   Derwood Kaplan, MD  gabapentin (NEURONTIN) 400 MG capsule Take 1 capsule (400 mg total) by mouth 3 (three) times daily. For agitation Patient not taking: Reported on 04/08/2018 02/09/18   Armandina Stammer I, NP  hydrOXYzine (ATARAX/VISTARIL) 25 MG tablet Take 1 tablet (25 mg) by mouth three times daily & 2 tablets (50 mg) at bedtime for anxiety/sleep Patient not taking: Reported on 04/08/2018 02/09/18   Armandina Stammer I, NP  ibuprofen (ADVIL) 200 MG tablet Take 2 tablets (400 mg total) by mouth 2 (two) times daily as needed for moderate pain. 06/09/20   Jeannie Fend, PA-C  methocarbamol (ROBAXIN) 500 MG tablet Take 1 tablet (500 mg total) by mouth 2 (two) times daily. 08/07/20   Hyman Hopes, Margaux, PA-C  mirtazapine (REMERON) 30 MG tablet Take  1 tablet (30 mg total) by mouth at bedtime. For depression Patient not taking: Reported on 04/08/2018 02/09/18   Armandina Stammer I, NP  OLANZapine (ZYPREXA) 2.5 MG tablet Take 1 tablet (2.5 mg total) by mouth at bedtime. For mood control Patient not taking: Reported on 04/08/2018 02/09/18   Armandina Stammer I, NP  ondansetron (ZOFRAN) 4 MG tablet Take 1 tablet (4 mg total) by mouth every 6 (six) hours as needed for nausea or vomiting. 10/28/18   Terrilee Files, MD  pantoprazole (PROTONIX) 40 MG tablet Take 1 tablet (40 mg total) by mouth daily. For acid reflux 10/28/18   Terrilee Files, MD  predniSONE (DELTASONE) 50 MG tablet Take 1 tablet (50 mg total) by mouth daily. 10/02/20   Derwood Kaplan, MD  promethazine (PHENERGAN) 25 MG tablet Take 1 tablet (25 mg total) by mouth every 6 (six) hours as needed for nausea. Patient not taking: Reported on 07/14/2018 06/04/18   Benjiman Core, MD    Allergies    Augmentin [amoxicillin-pot clavulanate] and Other  Review of Systems   Review of Systems  Constitutional:  Negative for fever.  HENT:  Negative for sore throat.   Eyes:  Negative for visual disturbance.  Respiratory:  Positive for shortness of breath.   Cardiovascular:  Negative for chest pain.  Gastrointestinal:  Negative for abdominal pain.  Genitourinary:  Negative for dysuria.  Musculoskeletal:  Negative for neck pain.  Skin:  Negative for rash.  Neurological:  Negative for headaches.   Physical Exam Updated Vital Signs BP (!) 138/98   Pulse 70   Temp 97.8 F (36.6 C)   Resp 16   Ht 5\' 9"  (1.753 m)   Wt 87 kg   SpO2 100%   BMI 28.32 kg/m   Physical Exam Vitals and nursing note reviewed.  Constitutional:      Appearance: He is well-developed.  HENT:     Head: Normocephalic and atraumatic.  Eyes:     Conjunctiva/sclera: Conjunctivae normal.  Cardiovascular:     Rate and Rhythm: Regular rhythm. Tachycardia present.     Heart sounds: No murmur heard. Pulmonary:      Effort: Accessory muscle usage present. No respiratory distress.     Breath sounds: Wheezing (few scattered, with good air movement) present.  Abdominal:     Palpations: Abdomen is soft.     Tenderness: There is no abdominal tenderness.  Musculoskeletal:        General: Normal range of motion.     Cervical back: Neck supple.     Right lower leg: No tenderness.     Left lower leg: No tenderness.  Skin:    General:  Skin is warm and dry.  Neurological:     General: No focal deficit present.     Mental Status: He is alert.    ED Results / Procedures / Treatments   Labs (all labs ordered are listed, but only abnormal results are displayed) Labs Reviewed - No data to display  EKG None  Radiology No results found.  Procedures Procedures   Medications Ordered in ED Medications  albuterol (VENTOLIN HFA) 108 (90 Base) MCG/ACT inhaler 2 puff (2 puffs Inhalation Given 11/06/20 1732)    ED Course  I have reviewed the triage vital signs and the nursing notes.  Pertinent labs & imaging results that were available during my care of the patient were reviewed by me and considered in my medical decision making (see chart for details).    MDM Rules/Calculators/A&P                         40 year old male with history of asthma here with an asthma exacerbation.  He has received steroids, IV magnesium and continuous neb by EMS.  On review of prior medical records he is here about once a month for similar presentations.  He denies any recent COVID exposures.  Good air movement now.  Tachycardic so we will give some time and then repeat inhalation treatment.  After period of observation patient feels much better and feels he can manage his symptoms at home.  Equal breath sounds bilaterally doubt pneumothorax.  No other signs of systemic illness I do not feel he needs antibiotics or chest x-ray at this time.  Return instructions discussed  Final Clinical Impression(s) / ED Diagnoses Final  diagnoses:  Moderate persistent asthma with exacerbation    Rx / DC Orders ED Discharge Orders          Ordered    predniSONE (DELTASONE) 20 MG tablet  Daily        11/06/20 1550             Terrilee Files, MD 11/07/20 [phone]

## 2020-11-06 NOTE — Discharge Instructions (Addendum)
You are seen in the emergency department for evaluation of increased shortness of breath.  You received steroids magnesium and breathing treatments with improvement in your breathing.  This is likely an asthma exacerbation.  Please continue to use the albuterol inhaler 2 puffs every 4 hours as needed.  We are prescribing you 4 more days of prednisone.  Please follow-up with your primary care doctor.

## 2020-12-06 ENCOUNTER — Emergency Department (HOSPITAL_COMMUNITY): Payer: Self-pay

## 2020-12-06 ENCOUNTER — Encounter (HOSPITAL_COMMUNITY): Payer: Self-pay | Admitting: Emergency Medicine

## 2020-12-06 ENCOUNTER — Emergency Department (HOSPITAL_COMMUNITY)
Admission: EM | Admit: 2020-12-06 | Discharge: 2020-12-06 | Disposition: A | Discharge status: Home / Self Care | Payer: Self-pay | Attending: Emergency Medicine | Admitting: Emergency Medicine

## 2020-12-06 DIAGNOSIS — M25512 Pain in left shoulder: Secondary | ICD-10-CM | POA: Insufficient documentation

## 2020-12-06 DIAGNOSIS — Z5321 Procedure and treatment not carried out due to patient leaving prior to being seen by health care provider: Secondary | ICD-10-CM | POA: Insufficient documentation

## 2020-12-06 DIAGNOSIS — J45909 Unspecified asthma, uncomplicated: Secondary | ICD-10-CM | POA: Insufficient documentation

## 2020-12-06 MED ORDER — ALBUTEROL SULFATE HFA 108 (90 BASE) MCG/ACT IN AERS
2.0000 | INHALATION_SPRAY | Freq: Once | RESPIRATORY_TRACT | Status: AC
  Administered 2020-12-06: 2 via RESPIRATORY_TRACT
  Filled 2020-12-06: qty 6.7

## 2020-12-06 MED ORDER — ALBUTEROL SULFATE (2.5 MG/3ML) 0.083% IN NEBU: 2.5000 mg | INHALATION_SOLUTION | Freq: Once | RESPIRATORY_TRACT | Status: DC

## 2020-12-06 NOTE — ED Notes (Signed)
Pt has been called x3 at noted times. Pt has not been present to name call or visible throughout ED lobby. Triage RN notified. 

## 2020-12-06 NOTE — ED Triage Notes (Signed)
Per EMS-states left shoulder pain for 1 week and asthma symptoms for 1 hour-does not have an inhaler-no s/s's of respiratory distress

## 2020-12-06 NOTE — ED Provider Notes (Signed)
Emergency Medicine Provider Triage Evaluation Note  Devin [name] , a 40 y.o. male  was evaluated in triage.  Pt complains of left shoulder pain and wheezing.  Shoulder pain has been present over the last week.  Pain has been constant.  Pain is worse with touch and range of motion.  Patient denies any recent falls or injuries.  Denies any numbness or weakness.  Patient reports history of asthma.  States that he does not currently have a inhaler.  States that he started having wheezing a few hours prior.  Patient endorses productive cough at baseline.  States that cough produces clear mucus.  Patient denies any fevers, chills, rhinorrhea, nasal congestion, sore throat.  Review of Systems  Positive: Wheezing, arthralgia Negative: Numbness, weakness, chest pain, leg swelling or tenderness, fevers, chills, rhinorrhea, nasal congestion, sore throat  Physical Exam  BP (!) 133/99   Pulse 85   Temp 98.2 F (36.8 C) (Oral)   Resp 20   SpO2 99%  Gen:   Awake, no distress   Resp:  Normal effort, scattered expiratory wheezing MSK:   Moves extremities without difficulty, no swelling warmth or erythema noted to left shoulder.  Patient has tenderness to left shoulder. Other:    Medical Decision Making  Medically screening exam initiated at 3:58 PM.  Appropriate orders placed.  Vernon Maish was informed that the remainder of the evaluation will be completed by another provider, this initial triage assessment does not replace that evaluation, and the importance of remaining in the ED until their evaluation is complete.  The patient appears stable so that the remainder of the work up may be completed by another provider.      Haskel Schroeder, PA-C 12/06/20 1600    Terrilee Files, MD 12/06/20 Ernestina [place]

## 2020-12-26 IMAGING — DX PORTABLE CHEST - 1 VIEW
1 series · 1 of 1 positions shown · non-contrast
Comparison: Radiograph July 16, 2018.

CLINICAL DATA: Asthma.

EXAM:
PORTABLE CHEST 1 VIEW

[chest ap]
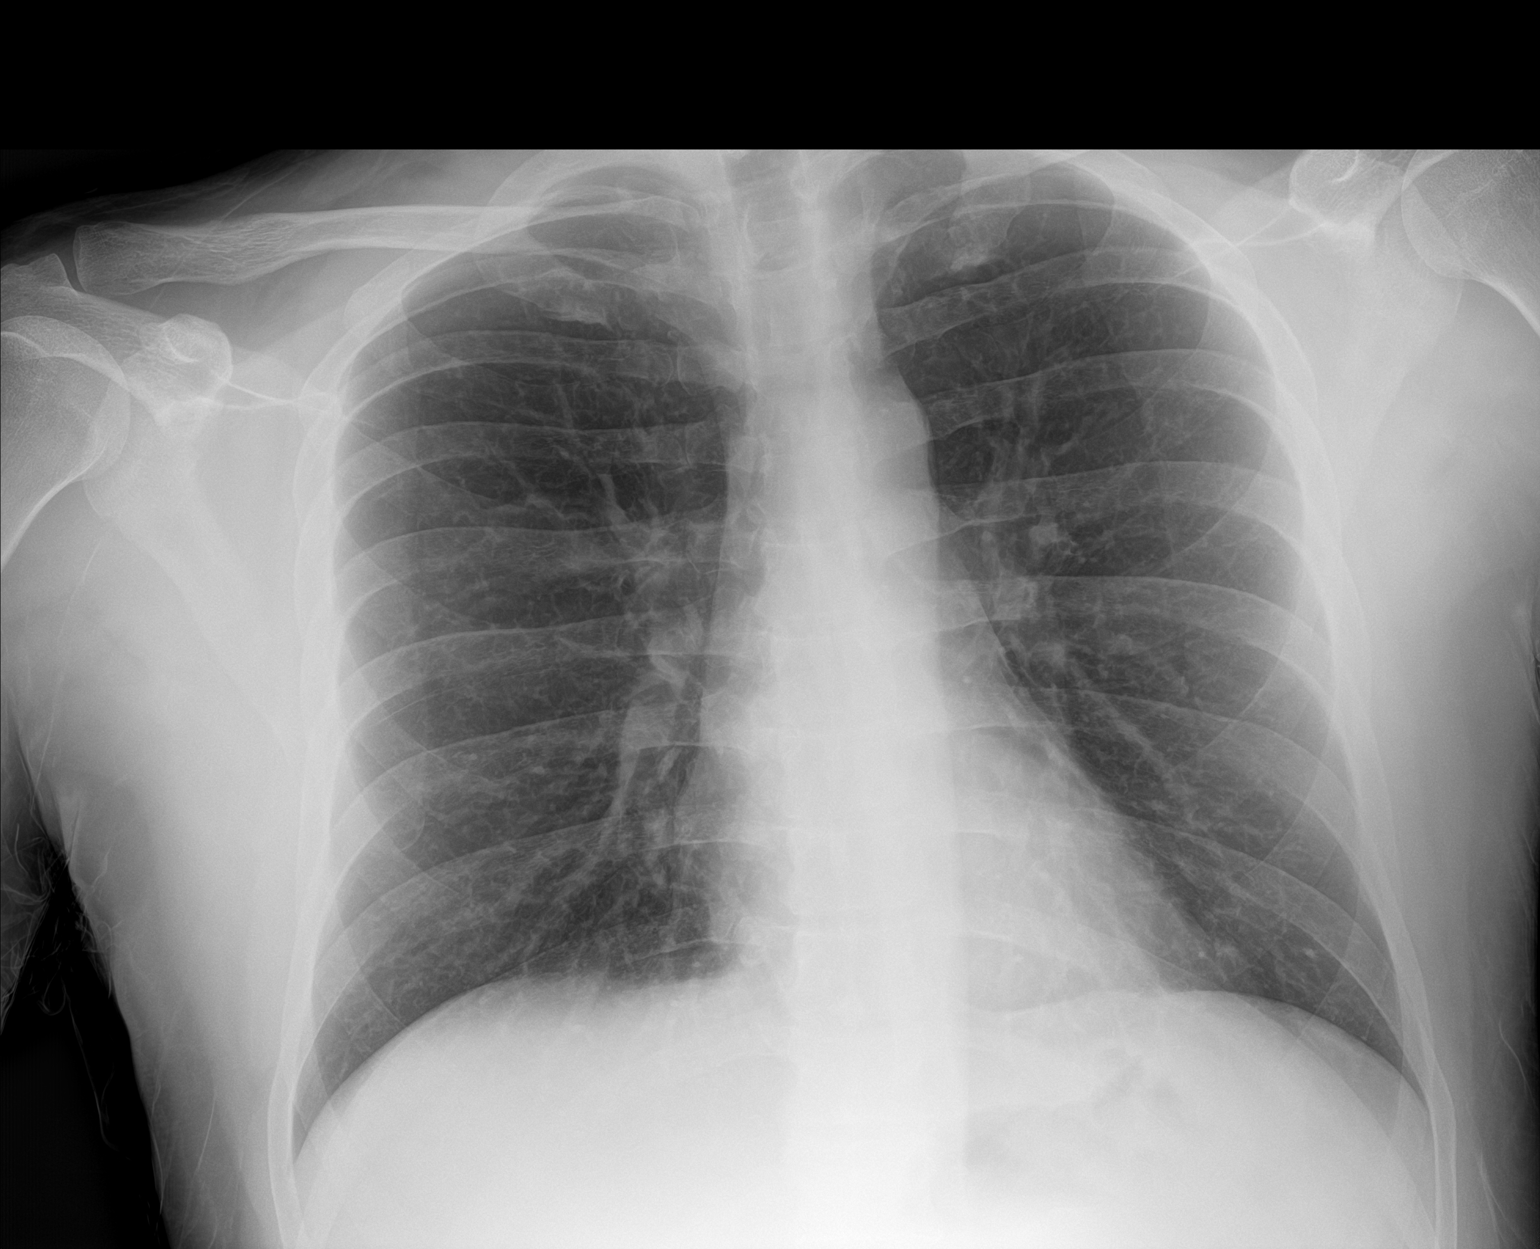

[1 of 1 positions shown; findings below may reference images not displayed]

FINDINGS: The heart size and mediastinal contours are within normal limits.
Both lungs are clear. The visualized skeletal structures are
unremarkable.
IMPRESSION: No active disease.

## 2020-12-31 ENCOUNTER — Encounter (HOSPITAL_COMMUNITY): Payer: Self-pay

## 2020-12-31 ENCOUNTER — Emergency Department (HOSPITAL_COMMUNITY)
Admission: EM | Admit: 2020-12-31 | Discharge: 2020-12-31 | Disposition: A | Discharge status: Home / Self Care | Payer: Self-pay | Attending: Student | Admitting: Student

## 2020-12-31 ENCOUNTER — Other Ambulatory Visit: Payer: Self-pay

## 2020-12-31 DIAGNOSIS — Z20822 Contact with and (suspected) exposure to covid-19: Secondary | ICD-10-CM

## 2020-12-31 DIAGNOSIS — J441 Chronic obstructive pulmonary disease with (acute) exacerbation: Secondary | ICD-10-CM | POA: Insufficient documentation

## 2020-12-31 DIAGNOSIS — Z7952 Long term (current) use of systemic steroids: Secondary | ICD-10-CM | POA: Insufficient documentation

## 2020-12-31 DIAGNOSIS — J45909 Unspecified asthma, uncomplicated: Secondary | ICD-10-CM | POA: Insufficient documentation

## 2020-12-31 DIAGNOSIS — Z87891 Personal history of nicotine dependence: Secondary | ICD-10-CM | POA: Insufficient documentation

## 2020-12-31 DIAGNOSIS — U071 COVID-19: Secondary | ICD-10-CM | POA: Insufficient documentation

## 2020-12-31 DIAGNOSIS — Z8616 Personal history of COVID-19: Secondary | ICD-10-CM | POA: Insufficient documentation

## 2020-12-31 DIAGNOSIS — Z28310 Unvaccinated for covid-19: Secondary | ICD-10-CM | POA: Insufficient documentation

## 2020-12-31 LAB — RESP PANEL BY RT-PCR (FLU A&B, COVID) ARPGX2
Influenza A by PCR: NEGATIVE
Influenza B by PCR: NEGATIVE
SARS Coronavirus 2 by RT PCR: POSITIVE — AB

## 2020-12-31 NOTE — Discharge Instructions (Signed)
It was a pleasure taking care of you today.  As discussed, your COVID and influenza test are pending.  Results should be available within the next 2 hours.  If your COVID test is positive, you must quarantine for 5 days since symptom onset.  You must be fever free for 24 hours before your quarantine can end.  You may take over-the-counter ibuprofen or Tylenol as needed for body aches.  Please follow-up with PCP if symptoms not improve over the next week.  Return to the ER for new or worsening symptoms.

## 2020-12-31 NOTE — ED Triage Notes (Signed)
Pt c/o congestion, chills, and body aches since this morning.

## 2020-12-31 NOTE — ED Provider Notes (Signed)
[facility]-EMERGENCY DEPT Provider Note   CSN: [id number] Arrival date & time: 12/31/20  1540     History Chief Complaint  Patient presents with   Nasal Congestion   Generalized Body Aches   Headache    Devin [name] is a 40 y.o. male with a past medical history significant for ADHD, asthma, bipolar 1 disorder, GERD who presents to the ED due to nasal congestion, chills, and myalgias that started earlier this morning.  Patient is unvaccinated against COVID-19.  Denies chest pain and shortness of breath.  Denies abdominal pain, nausea, vomiting, and diarrhea.  He has not tried anything for his symptoms.  Patient states he had COVID-19 roughly 2 years ago.  No neck stiffness.  Patient has a history of asthma and denies wheezing.  History obtained from patient and past medical records. No interpreter used during encounter.       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   Bipolar II disorder (HCC) 01/31/2018   Hypoxia    COPD exacerbation (HCC)    Leukocytosis 11/04/2015   Transaminitis 07/17/2015   Tooth ache 07/17/2015   Asthma with status asthmaticus 10/14/2011   ADHD (attention deficit hyperactivity disorder) 10/14/2011    Past Surgical History:  Procedure Laterality Date   TYMPANOPLASTY Left 1997       Family History  Problem Relation Age of Onset   Coronary artery disease Father    Heart attack Father    Heart disease Father    Heart failure Mother    Hyperlipidemia Mother    Hypertension Mother    Migraines Mother    COPD Mother    Diabetes Maternal Aunt    Asthma Maternal  Grandfather    Asthma Paternal Grandfather     Social History   Tobacco Use   Smoking status: Former    Packs/day: 0.50    Types: Cigarettes    Quit date: 09/01/2018    Years since quitting: 2.3   Smokeless tobacco: Never  Vaping Use   Vaping Use: Never used  Substance Use Topics   Alcohol use: Yes    Alcohol/week: 0.0 standard drinks    Comment: occ   Drug use: Yes    Types: Marijuana    Comment:                      Home Medications Prior to Admission medications   Medication Sig Start Date End Date Taking? Authorizing Provider  albuterol (VENTOLIN HFA) 108 (90 Base) MCG/ACT inhaler Inhale 2 puffs into the lungs every 4 (four) hours as needed for wheezing or shortness of breath. 08/25/20   Cristina Gong, PA-C  fluticasone (FLOVENT HFA) 44 MCG/ACT inhaler Inhale 2 puffs into the lungs 2 (two) times daily. For asthma 10/02/20   Derwood Kaplan, MD  gabapentin (NEURONTIN) 400 MG capsule Take 1 capsule (400 mg total) by mouth 3 (three) times daily. For agitation Patient not taking: Reported on 04/08/2018 02/09/18   Armandina Stammer I, NP  hydrOXYzine (ATARAX/VISTARIL) 25 MG tablet Take 1 tablet (25 mg) by mouth three times daily & 2  tablets (50 mg) at bedtime for anxiety/sleep Patient not taking: Reported on 04/08/2018 02/09/18   Armandina Stammer I, NP  ibuprofen (ADVIL) 200 MG tablet Take 2 tablets (400 mg total) by mouth 2 (two) times daily as needed for moderate pain. 06/09/20   Jeannie Fend, PA-C  methocarbamol (ROBAXIN) 500 MG tablet Take 1 tablet (500 mg total) by mouth 2 (two) times daily. 08/07/20   Hyman Hopes, Margaux, PA-C  mirtazapine (REMERON) 30 MG tablet Take 1 tablet (30 mg total) by mouth at bedtime. For depression Patient not taking: Reported on 04/08/2018 02/09/18   Armandina Stammer I, NP  OLANZapine (ZYPREXA) 2.5 MG tablet Take 1 tablet (2.5 mg total) by mouth at bedtime. For mood control Patient not taking: Reported on 04/08/2018 02/09/18   Armandina Stammer I, NP  ondansetron  (ZOFRAN) 4 MG tablet Take 1 tablet (4 mg total) by mouth every 6 (six) hours as needed for nausea or vomiting. 10/28/18   Terrilee Files, MD  pantoprazole (PROTONIX) 40 MG tablet Take 1 tablet (40 mg total) by mouth daily. For acid reflux 10/28/18   Terrilee Files, MD  predniSONE (DELTASONE) 20 MG tablet Take 3 tablets (60 mg total) by mouth daily. 11/06/20   Terrilee Files, MD  promethazine (PHENERGAN) 25 MG tablet Take 1 tablet (25 mg total) by mouth every 6 (six) hours as needed for nausea. Patient not taking: Reported on 07/14/2018 06/04/18   Benjiman Core, MD    Allergies    Augmentin [amoxicillin-pot clavulanate] and Other  Review of Systems   Review of Systems  Constitutional:  Positive for chills. Negative for fever.  HENT:  Positive for congestion. Negative for rhinorrhea and sore throat.   Respiratory:  Negative for cough and shortness of breath.   Cardiovascular:  Negative for chest pain.  Gastrointestinal:  Negative for abdominal pain, diarrhea, nausea and vomiting.  Musculoskeletal:  Positive for myalgias.   Physical Exam Updated Vital Signs BP (!) 127/95 (BP Location: Left Arm)   Pulse 92   Temp 97.8 F (36.6 C) (Oral)   Resp 18   SpO2 98%   Physical Exam Vitals and nursing note reviewed.  Constitutional:      General: He is not in acute distress.    Appearance: He is not ill-appearing.  HENT:     Head: Normocephalic.  Eyes:     Pupils: Pupils are equal, round, and reactive to light.  Neck:     Comments: No meningismus. Cardiovascular:     Rate and Rhythm: Normal rate and regular rhythm.     Pulses: Normal pulses.     Heart sounds: Normal heart sounds. No murmur heard.   No friction rub. No gallop.  Pulmonary:     Effort: Pulmonary effort is normal.     Breath sounds: Normal breath sounds.     Comments: Respirations equal and unlabored, patient able to speak in full sentences, lungs clear to auscultation bilaterally Abdominal:     General: Abdomen  is flat. There is no distension.     Palpations: Abdomen is soft.     Tenderness: There is no abdominal tenderness. There is no guarding or rebound.  Musculoskeletal:        General: Normal range of motion.     Cervical back: Neck supple.  Skin:    General: Skin is warm and dry.  Neurological:     General: No focal deficit present.     Mental Status: He is alert.  Psychiatric:  Mood and Affect: Mood normal.        Behavior: Behavior normal.    ED Results / Procedures / Treatments   Labs (all labs ordered are listed, but only abnormal results are displayed) Labs Reviewed  RESP PANEL BY RT-PCR (FLU A&B, COVID) [id number]    EKG None  Radiology No results found.  Procedures Procedures   Medications Ordered in ED Medications - No data to display  ED Course  I have reviewed the triage vital signs and the nursing notes.  Pertinent labs & imaging results that were available during my care of the patient were reviewed by me and considered in my medical decision making (see chart for details).    MDM Rules/Calculators/A&P                           40 year old male presents to the ED due to COVID-like symptoms a started earlier this morning.  Patient is unvaccinated against COVID-19.  Upon arrival, stable vitals.  Patient is afebrile, not tachycardic or hypoxic.  Patient nontoxic-appearing. Physical exam reassuring.  Lungs clear to auscultation bilaterally.  No rales, rhonchi, or wheeze.  Low suspicion for pneumonia.  No meningismus to suggest meningitis.  Throat with mild erythema and no tonsillar hypertrophy or exudates.  No abscess appreciated on exam.  Abdomen soft, nondistended, nontender.  No lower extremity edema. Suspect symptoms related to Covid infection versus other viral etiology.  Patient discharged with symptomatic treatment.  Quarantine guidelines discussed. Strict ED precautions discussed with patient. Patient states understanding and agrees to plan. Patient  discharged home in no acute distress and stable vitals.  Devin [name] was evaluated in Emergency Department on 12/31/2020 for the symptoms described in the history of present illness. He was evaluated in the context of the global COVID-19 pandemic, which necessitated consideration that the patient might be at risk for infection with the SARS-CoV-2 virus that causes COVID-19. Institutional protocols and algorithms that pertain to the evaluation of patients at risk for COVID-19 are in a state of rapid change based on information released by regulatory bodies including the CDC and federal and state organizations. These policies and algorithms were followed during the patient's care in the ED.   Final Clinical Impression(s) / ED Diagnoses Final diagnoses:  Suspected COVID-19 virus infection    Rx / DC Orders ED Discharge Orders     None        Jesusita Oka 12/31/20 1610    Kommor, [place], MD 12/31/20 1746

## 2021-01-17 IMAGING — CR CHEST - 2 VIEW
2 series · 2 of 2 positions shown · non-contrast
Comparison: Chest x-ray dated 09/25/2018

CLINICAL DATA: Shortness of breath, history of asthma.

EXAM:
CHEST - 2 VIEW

[w chest pa]
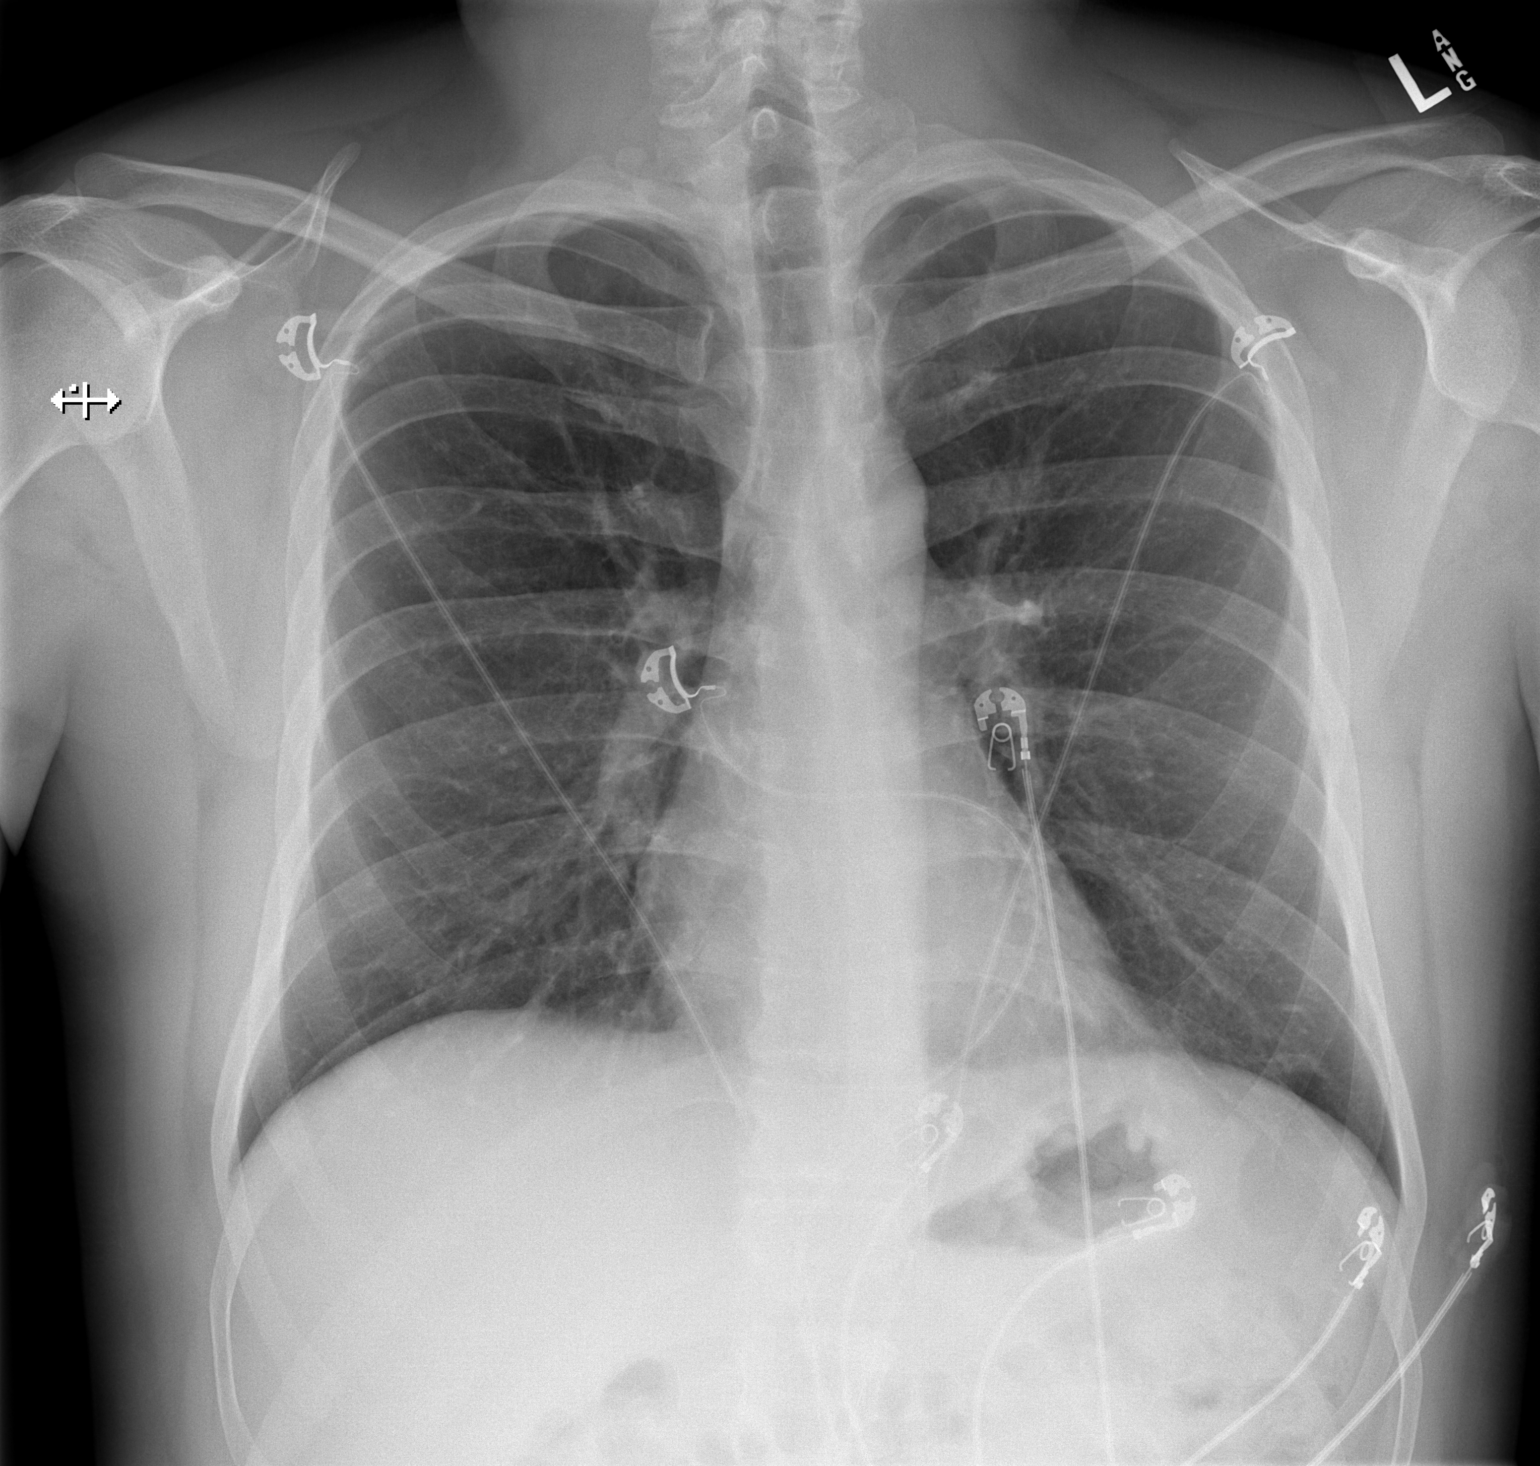

[w chest lat]
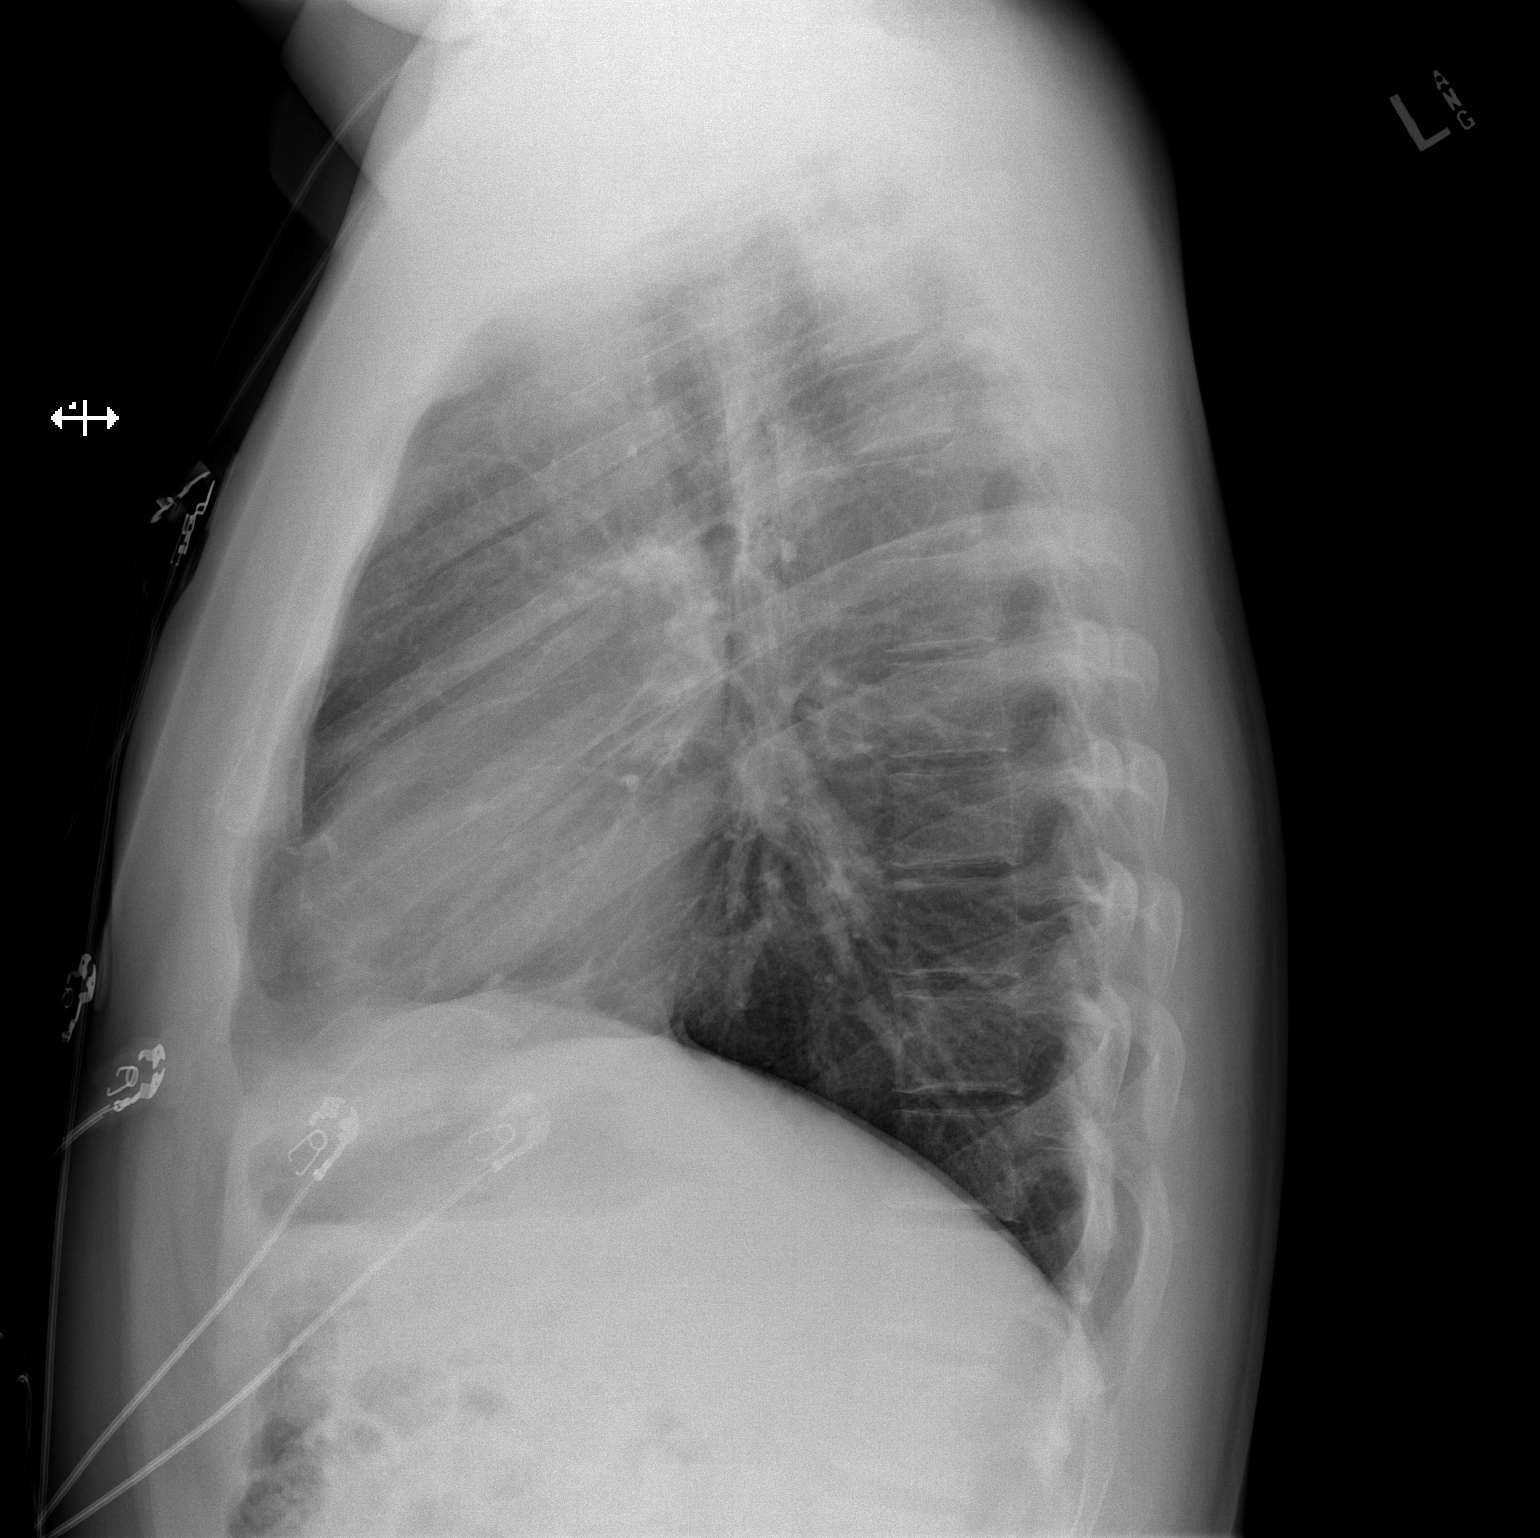

[2 of 2 positions shown; findings below may reference images not displayed]

FINDINGS: Cardiomediastinal silhouette is within normal limits in size and
configuration. Lungs are clear. Lung volumes are normal. No evidence
of pneumonia. No pleural effusion. No pneumothorax seen. Osseous
structures about the chest are unremarkable.
IMPRESSION: No active cardiopulmonary disease.  No evidence of pneumonia.

## 2021-03-19 ENCOUNTER — Other Ambulatory Visit: Payer: Self-pay

## 2021-03-19 ENCOUNTER — Encounter (HOSPITAL_COMMUNITY): Payer: Self-pay

## 2021-03-19 ENCOUNTER — Emergency Department (HOSPITAL_COMMUNITY)
Admission: EM | Admit: 2021-03-19 | Discharge: 2021-03-19 | Disposition: A | Discharge status: Home / Self Care | Payer: Self-pay | Attending: Emergency Medicine | Admitting: Emergency Medicine

## 2021-03-19 ENCOUNTER — Emergency Department (HOSPITAL_COMMUNITY): Payer: Self-pay

## 2021-03-19 DIAGNOSIS — Z87891 Personal history of nicotine dependence: Secondary | ICD-10-CM | POA: Insufficient documentation

## 2021-03-19 DIAGNOSIS — J4541 Moderate persistent asthma with (acute) exacerbation: Secondary | ICD-10-CM | POA: Insufficient documentation

## 2021-03-19 DIAGNOSIS — Z7951 Long term (current) use of inhaled steroids: Secondary | ICD-10-CM | POA: Insufficient documentation

## 2021-03-19 DIAGNOSIS — J449 Chronic obstructive pulmonary disease, unspecified: Secondary | ICD-10-CM | POA: Insufficient documentation

## 2021-03-19 MED ORDER — PREDNISONE 10 MG PO TABS: 40.0000 mg | ORAL_TABLET | Freq: Every day | ORAL | 0 refills | Status: AC

## 2021-03-19 MED ORDER — ALBUTEROL SULFATE HFA 108 (90 BASE) MCG/ACT IN AERS
2.0000 | INHALATION_SPRAY | Freq: Once | RESPIRATORY_TRACT | Status: AC
  Administered 2021-03-19: 19:00:00 2 via RESPIRATORY_TRACT
  Filled 2021-03-19: qty 6.7

## 2021-03-19 MED ORDER — ALBUTEROL SULFATE HFA 108 (90 BASE) MCG/ACT IN AERS: 1.0000 | INHALATION_SPRAY | Freq: Four times a day (QID) | RESPIRATORY_TRACT | 1 refills | Status: DC | PRN

## 2021-03-19 NOTE — ED Triage Notes (Signed)
Per EMS- patient reports that he ran out of his Albuterol inhaler and has a sudden onset of SOB today.  EMS states they gave an albuterol neb treatment, solumedrol 125 mg IV. Sats 100% after interventions on room air.

## 2021-03-19 NOTE — ED Provider Notes (Signed)
Emergency Medicine Provider Triage Evaluation Note  Devin [name] , a 40 y.o. male  was evaluated in triage.  Pt complains of shortness of breath and wheezing onset upon waking up at 3 PM.  Patient denies recent illness.  He reports he was out of his albuterol inhaler and nebulizer treatments and activated EMS.  He was given nebulizer treatment and Solu-Medrol 125 mg with significant improvement in his symptoms.   Review of Systems  Positive: Shortness of breath Negative: Chest pain, fever  Physical Exam  BP (!) 126/109   Pulse 93   Temp 98.2 F (36.8 C) (Oral)   Resp (!) 22   Ht 5\' 8"  (1.727 m)   Wt 86.2 kg   SpO2 100%   BMI 28.89 kg/m  Gen:   Awake, no distress   Resp:  Normal effort, diminished lung sounds on auscultation MSK:   Moves extremities without difficulty  Other:    Medical Decision Making  Medically screening exam initiated at 5:07 PM.  Appropriate orders placed.  Devin [name] was informed that the remainder of the evaluation will be completed by another provider, this initial triage assessment does not replace that evaluation, and the importance of remaining in the ED until their evaluation is complete.     Devin Killings, PA-C 03/19/21 1709    03/21/21, MD 03/19/21 [phone]

## 2021-03-19 NOTE — ED Provider Notes (Signed)
[facility] DEPT Provider Note   CSN: [id number] Arrival date & time: 03/19/21  1642     History Chief Complaint  Patient presents with   Asthma   Medication Refill    Devin [name] is a 40 y.o. male.  HPI Patient is a 40 year old male with a history of asthma who presents to the emergency department due to wheezing and shortness of breath.  Patient states that he recently ran out of his nebulizers as well as his albuterol at home and woke up today feeling more short of breath with associated wheezing.  He contacted EMS who gave him a nebulizer treatment as well as 125 mg of Solu-Medrol in route.  Patient notes complete resolution of his symptoms.  Denies any recent illnesses.  Denies any URI symptoms, nausea, vomiting, diarrhea, chest pain, shortness of breath, abdominal pain.    Past Medical History:  Diagnosis Date   ADHD (attention deficit hyperactivity disorder)    Asthma    Bipolar 1 disorder (HCC)    GERD (gastroesophageal reflux disease)    Transaminitis 07/17/2015    Patient Active Problem List   Diagnosis Date Noted   Substance induced mood disorder ([place]) 03/07/2018   Depression    MDD (major depressive disorder), recurrent severe, without psychosis ([place]) 01/31/2018   Bipolar II disorder ([place]) 01/31/2018   Hypoxia    COPD exacerbation ([place])    Leukocytosis 11/04/2015   Transaminitis 07/17/2015   Tooth ache 07/17/2015   Asthma with status asthmaticus 10/14/2011   ADHD (attention deficit hyperactivity disorder) 10/14/2011    Past Surgical History:  Procedure Laterality Date   TYMPANOPLASTY Left 1997       Family History  Problem Relation Age of Onset   Coronary artery disease Father    Heart attack Father    Heart disease Father    Heart failure Mother    Hyperlipidemia Mother    Hypertension Mother    Migraines Mother    COPD Mother    Diabetes Maternal Aunt    Asthma Maternal Grandfather    Asthma Paternal  Grandfather     Social History   Tobacco Use   Smoking status: Former    Packs/day: 0.50    Types: Cigarettes    Quit date: 09/01/2018    Years since quitting: 2.5   Smokeless tobacco: Never  Vaping Use   Vaping Use: Never used  Substance Use Topics   Alcohol use: Yes    Alcohol/week: 0.0 standard drinks    Comment: occ   Drug use: Yes    Types: Marijuana    Comment:                      Home Medications Prior to Admission medications   Medication Sig Start Date End Date Taking? Authorizing Provider  albuterol (VENTOLIN HFA) 108 (90 Base) MCG/ACT inhaler Inhale 1-2 puffs into the lungs every 6 (six) hours as needed for wheezing or shortness of breath. 03/19/21  Yes Rayna Sexton, PA-C  predniSONE (DELTASONE) 10 MG tablet Take 4 tablets (40 mg total) by mouth daily with breakfast for 5 days. 03/19/21 03/24/21 Yes Rayna Sexton, PA-C  fluticasone (FLOVENT HFA) 44 MCG/ACT inhaler Inhale 2 puffs into the lungs 2 (two) times daily. For asthma 10/02/20   Varney Biles, MD  gabapentin (NEURONTIN) 400 MG capsule Take 1 capsule (400 mg total) by mouth 3 (three) times daily. For agitation Patient not taking: Reported on 04/08/2018 02/09/18   Lindell Spar  I, NP  hydrOXYzine (ATARAX/VISTARIL) 25 MG tablet Take 1 tablet (25 mg) by mouth three times daily & 2 tablets (50 mg) at bedtime for anxiety/sleep Patient not taking: Reported on 04/08/2018 02/09/18   Lindell Spar I, NP  ibuprofen (ADVIL) 200 MG tablet Take 2 tablets (400 mg total) by mouth 2 (two) times daily as needed for moderate pain. 06/09/20   Tacy Learn, PA-C  methocarbamol (ROBAXIN) 500 MG tablet Take 1 tablet (500 mg total) by mouth 2 (two) times daily. 08/07/20   Alroy Bailiff, Margaux, PA-C  mirtazapine (REMERON) 30 MG tablet Take 1 tablet (30 mg total) by mouth at bedtime. For depression Patient not taking: Reported on 04/08/2018 02/09/18   Lindell Spar I, NP  OLANZapine (ZYPREXA) 2.5 MG tablet Take 1 tablet (2.5 mg total) by  mouth at bedtime. For mood control Patient not taking: Reported on 04/08/2018 02/09/18   Lindell Spar I, NP  ondansetron (ZOFRAN) 4 MG tablet Take 1 tablet (4 mg total) by mouth every 6 (six) hours as needed for nausea or vomiting. 10/28/18   Hayden Rasmussen, MD  pantoprazole (PROTONIX) 40 MG tablet Take 1 tablet (40 mg total) by mouth daily. For acid reflux 10/28/18   Hayden Rasmussen, MD  promethazine (PHENERGAN) 25 MG tablet Take 1 tablet (25 mg total) by mouth every 6 (six) hours as needed for nausea. Patient not taking: Reported on 07/14/2018 06/04/18   Davonna Belling, MD    Allergies    Augmentin [amoxicillin-pot clavulanate] and Other  Review of Systems   Review of Systems  All other systems reviewed and are negative. Ten systems reviewed and are negative for acute change, except as noted in the HPI.   Physical Exam Updated Vital Signs BP (!) 126/109   Pulse 93   Temp 98.2 F (36.8 C) (Oral)   Resp (!) 22   Ht 5\' 8"  (1.727 m)   Wt 86.2 kg   SpO2 100%   BMI 28.89 kg/m   Physical Exam Vitals and nursing note reviewed.  Constitutional:      General: He is not in acute distress.    Appearance: Normal appearance. He is not ill-appearing, toxic-appearing or diaphoretic.  HENT:     Head: Normocephalic and atraumatic.     Right Ear: External ear normal.     Left Ear: External ear normal.     Nose: Nose normal.     Mouth/Throat:     Mouth: Mucous membranes are moist.     Pharynx: Oropharynx is clear. No oropharyngeal exudate or posterior oropharyngeal erythema.  Eyes:     Extraocular Movements: Extraocular movements intact.  Cardiovascular:     Rate and Rhythm: Normal rate and regular rhythm.     Pulses: Normal pulses.     Heart sounds: Normal heart sounds. No murmur heard.   No friction rub. No gallop.  Pulmonary:     Effort: Pulmonary effort is normal. No respiratory distress.     Breath sounds: Normal breath sounds. No stridor. No wheezing, rhonchi or rales.      Comments: Lungs are clear to auscultation bilaterally.  No wheezing, rales, or rhonchi.  Speaking in clear and complete sentences.  No respiratory distress noted. Abdominal:     General: Abdomen is flat.     Tenderness: There is no abdominal tenderness.  Musculoskeletal:        General: Normal range of motion.     Cervical back: Normal range of motion and neck supple. No tenderness.  Skin:    General: Skin is warm and dry.  Neurological:     General: No focal deficit present.     Mental Status: He is alert and oriented to person, place, and time.  Psychiatric:        Mood and Affect: Mood normal.        Behavior: Behavior normal.    ED Results / Procedures / Treatments   Labs (all labs ordered are listed, but only abnormal results are displayed) Labs Reviewed - No data to display  EKG None  Radiology DG Chest 2 View  Result Date: 03/19/2021 CLINICAL DATA:  Dyspnea, shortness of breath and wheezing. EXAM: CHEST - 2 VIEW COMPARISON:  Chest x-ray dated 10/02/2020 FINDINGS: Heart size and mediastinal contours are within normal limits. Lungs are clear. No pleural effusion or pneumothorax is seen. Osseous structures about the chest are unremarkable. IMPRESSION: No active cardiopulmonary disease. Electronically Signed   By: Bary Richard M.D.   On: 03/19/2021 18:00    Procedures Procedures   Medications Ordered in ED Medications - No data to display  ED Course  I have reviewed the triage vital signs and the nursing notes.  Pertinent labs & imaging results that were available during my care of the patient were reviewed by me and considered in my medical decision making (see chart for details).    MDM Rules/Calculators/A&P                          Patient is a 40 year old male who presents to the emergency department due to what appears to be an asthma exacerbation.  Patient given Solu-Medrol as well as a DuoNeb in route and notes complete resolution of his symptoms.  Physical  exam appears extremely reassuring.  Heart is regular rate and rhythm.  Lungs are clear to auscultation bilaterally.  No wheezing noted on my exam.  Patient speaking clearly and coherently.  Does not appear to be in any respiratory distress at this time.  Patient offered an additional DuoNeb as well as further treatments but declined.  States that he is comfortable being discharged at this time.  We will discharge him on a course of prednisone as well as provide him a prescription for additional albuterol inhalers.  We discussed return precautions.  His questions were answered and he was amicable at the time of discharge.  Final Clinical Impression(s) / ED Diagnoses Final diagnoses:  Moderate persistent asthma with exacerbation   Rx / DC Orders ED Discharge Orders          Ordered    predniSONE (DELTASONE) 10 MG tablet  Daily with breakfast        03/19/21 1843    albuterol (VENTOLIN HFA) 108 (90 Base) MCG/ACT inhaler  Every 6 hours PRN        03/19/21 1843             Placido Sou, PA-C 03/19/21 1852    Vanetta Mulders, MD 03/22/21 1916

## 2021-03-19 NOTE — Discharge Instructions (Signed)
I am prescribing you a strong steroid medication called prednisone.  Please only take this as prescribed.  Please take it early in the morning, as this medication can be stimulating and make it difficult to sleep at night.  I have additionally prescribed you a new albuterol inhaler.  Please use this as needed.  If you develop any new or worsening symptoms please come back to the emergency department.

## 2021-07-16 ENCOUNTER — Other Ambulatory Visit: Payer: Self-pay

## 2021-07-16 ENCOUNTER — Emergency Department (HOSPITAL_COMMUNITY)
Admission: EM | Admit: 2021-07-16 | Discharge: 2021-07-16 | Disposition: A | Discharge status: Home / Self Care | Payer: Self-pay | Attending: Emergency Medicine | Admitting: Emergency Medicine

## 2021-07-16 ENCOUNTER — Encounter (HOSPITAL_COMMUNITY): Payer: Self-pay

## 2021-07-16 DIAGNOSIS — Z20822 Contact with and (suspected) exposure to covid-19: Secondary | ICD-10-CM | POA: Insufficient documentation

## 2021-07-16 DIAGNOSIS — J4541 Moderate persistent asthma with (acute) exacerbation: Secondary | ICD-10-CM | POA: Insufficient documentation

## 2021-07-16 DIAGNOSIS — Z7951 Long term (current) use of inhaled steroids: Secondary | ICD-10-CM | POA: Insufficient documentation

## 2021-07-16 LAB — GROUP A STREP BY PCR: Group A Strep by PCR: NOT DETECTED

## 2021-07-16 LAB — RESP PANEL BY RT-PCR (FLU A&B, COVID) ARPGX2
Influenza A by PCR: NEGATIVE
Influenza B by PCR: NEGATIVE
SARS Coronavirus 2 by RT PCR: NEGATIVE

## 2021-07-16 MED ORDER — ALBUTEROL SULFATE HFA 108 (90 BASE) MCG/ACT IN AERS
2.0000 | INHALATION_SPRAY | Freq: Once | RESPIRATORY_TRACT | Status: AC
  Administered 2021-07-16: 2 via RESPIRATORY_TRACT
  Filled 2021-07-16: qty 6.7

## 2021-07-16 MED ORDER — PREDNISONE 10 MG PO TABS: 20.0000 mg | ORAL_TABLET | Freq: Every day | ORAL | 0 refills | Status: AC

## 2021-07-16 MED ORDER — PREDNISONE 20 MG PO TABS
60.0000 mg | ORAL_TABLET | Freq: Once | ORAL | Status: AC
  Administered 2021-07-16: 60 mg via ORAL
  Filled 2021-07-16: qty 3

## 2021-07-16 NOTE — Discharge Instructions (Signed)
Please pick up prednisone prescription and take as prescribed.  Continue using the albuterol inhaler as needed.  ? ?Follow-up with [facility] for primary care needs. ? ?Return to the ED for any new/worsening symptoms. ?

## 2021-07-16 NOTE — ED Triage Notes (Signed)
Pt BIB EMS from place of work. Pt walked into work @ 0600 w/ an asthma attack. Hx of asthma. EMS provided 2 nebulizer's (albuterol) and pt O2 went to 100 room air. ? ?90 room air (initial O2) ?136/90 ?90 ? ?

## 2021-07-16 NOTE — ED Notes (Signed)
Pt states understanding of dc instructions, importance of follow up. Pt denies questions or concerns and declined transportation assistance upon dc. Pt ambulated w/ a steady gait w/o need for assistance. No belongings left in room upon dc. ? ?

## 2021-07-16 NOTE — ED Provider Notes (Signed)
?[facility] DEPT ?Provider Note ? ? ?CSN: [id number] ?Arrival date & time: 07/16/21  0702 ? ?  ? ?History ? ?No chief complaint on file. ? ? ?Devin [name] is a 41 y.o. male with PMHx asthma who presents to the ED today via EMS for asthma exacerbation.  Patient mentions that he has been out of his albuterol inhaler for the past couple of days.  He states that he started a new job at [employer] yesterday and immediately upon walking into the store began feeling like he was having an asthma exacerbation.  EMS reports that the patient had diffuse wheezing throughout and his initial O2 sat was around 90% on room air.  He was provided with 2 albuterol nebulizer treatments with significant improvement in his O2 went up to 100% on room air.  Patient states that he feels slightly tight at this time.  No Solu-Medrol/prednisone provided with EMS.  He also mentions that he began having a sore throat yesterday which is atypical for him.  He denies any fevers, chills, body aches however does report he has been coughing as well.  Denies any recent sick contacts. ? ?The history is provided by the patient, the EMS personnel and medical records.  ? ?  ? ?Home Medications ?Prior to Admission medications   ?Medication Sig Start Date End Date Taking? Authorizing Provider  ?predniSONE (DELTASONE) 10 MG tablet Take 2 tablets (20 mg total) by mouth daily for 5 days. 07/16/21 07/21/21 Yes Preslynn Bier, PA-C  ?albuterol (VENTOLIN HFA) 108 (90 Base) MCG/ACT inhaler Inhale 1-2 puffs into the lungs every 6 (six) hours as needed for wheezing or shortness of breath. 03/19/21   Rayna Sexton, PA-C  ?fluticasone (FLOVENT HFA) 44 MCG/ACT inhaler Inhale 2 puffs into the lungs 2 (two) times daily. For asthma 10/02/20   Varney Biles, MD  ?gabapentin (NEURONTIN) 400 MG capsule Take 1 capsule (400 mg total) by mouth 3 (three) times daily. For agitation ?Patient not taking: Reported on 04/08/2018 02/09/18   Lindell Spar I,  NP  ?hydrOXYzine (ATARAX/VISTARIL) 25 MG tablet Take 1 tablet (25 mg) by mouth three times daily & 2 tablets (50 mg) at bedtime for anxiety/sleep ?Patient not taking: Reported on 04/08/2018 02/09/18   Lindell Spar I, NP  ?ibuprofen (ADVIL) 200 MG tablet Take 2 tablets (400 mg total) by mouth 2 (two) times daily as needed for moderate pain. 06/09/20   Tacy Learn, PA-C  ?methocarbamol (ROBAXIN) 500 MG tablet Take 1 tablet (500 mg total) by mouth 2 (two) times daily. 08/07/20   Eustaquio Maize, PA-C  ?mirtazapine (REMERON) 30 MG tablet Take 1 tablet (30 mg total) by mouth at bedtime. For depression ?Patient not taking: Reported on 04/08/2018 02/09/18   Lindell Spar I, NP  ?OLANZapine (ZYPREXA) 2.5 MG tablet Take 1 tablet (2.5 mg total) by mouth at bedtime. For mood control ?Patient not taking: Reported on 04/08/2018 02/09/18   Lindell Spar I, NP  ?ondansetron (ZOFRAN) 4 MG tablet Take 1 tablet (4 mg total) by mouth every 6 (six) hours as needed for nausea or vomiting. 10/28/18   Hayden Rasmussen, MD  ?pantoprazole (PROTONIX) 40 MG tablet Take 1 tablet (40 mg total) by mouth daily. For acid reflux 10/28/18   Hayden Rasmussen, MD  ?promethazine (PHENERGAN) 25 MG tablet Take 1 tablet (25 mg total) by mouth every 6 (six) hours as needed for nausea. ?Patient not taking: Reported on 07/14/2018 06/04/18   Davonna Belling, MD  ?   ? ?  Allergies    ?Augmentin [amoxicillin-pot clavulanate] and Other   ? ?Review of Systems   ?Review of Systems  ?Constitutional:  Negative for chills and fever.  ?HENT:  Positive for sore throat. Negative for trouble swallowing and voice change.   ?Respiratory:  Positive for cough, chest tightness and wheezing. Negative for shortness of breath.   ?Musculoskeletal:  Negative for myalgias.  ?All other systems reviewed and are negative. ? ?Physical Exam ?Updated Vital Signs ?BP 105/68 (BP Location: Left Arm)   Pulse 99   Temp 97.9 ?F (36.6 ?C) (Oral)   Resp 16   Ht 5\' 8"  (1.727 m)   Wt 86.2 kg    BMI 28.89 kg/m?  ?Physical Exam ?Vitals and nursing note reviewed.  ?Constitutional:   ?   Appearance: He is not ill-appearing or diaphoretic.  ?HENT:  ?   Head: Normocephalic and atraumatic.  ?   Mouth/Throat:  ?   Mouth: Mucous membranes are moist.  ?   Pharynx: Posterior oropharyngeal erythema present. No oropharyngeal exudate.  ?Eyes:  ?   Conjunctiva/sclera: Conjunctivae normal.  ?Cardiovascular:  ?   Rate and Rhythm: Normal rate and regular rhythm.  ?Pulmonary:  ?   Effort: Pulmonary effort is normal.  ?   Breath sounds: Normal breath sounds. No wheezing, rhonchi or rales.  ?   Comments: Speaking in shorter sentences. Satting 100% on RA. Lungs clear at this time without wheezes, rales, rhonchi.  ?Abdominal:  ?   Palpations: Abdomen is soft.  ?   Tenderness: There is no abdominal tenderness.  ?Musculoskeletal:  ?   Cervical back: Neck supple.  ?Skin: ?   General: Skin is warm and dry.  ?Neurological:  ?   Mental Status: He is alert.  ? ? ?ED Results / Procedures / Treatments   ?Labs ?(all labs ordered are listed, but only abnormal results are displayed) ?Labs Reviewed  ?GROUP A STREP BY PCR  ?RESP PANEL BY RT-PCR (FLU A&B, COVID) [id number]  ? ? ?EKG ?None ? ?Radiology ?No results found. ? ?Procedures ?Procedures  ? ? ?Medications Ordered in ED ?Medications  ?predniSONE (DELTASONE) tablet 60 mg (60 mg Oral Given 07/16/21 0720)  ?albuterol (VENTOLIN HFA) 108 (90 Base) MCG/ACT inhaler 2 puff (2 puffs Inhalation Given 07/16/21 0721)  ? ? ?ED Course/ Medical Decision Making/ A&P ?  ?                        ?Medical Decision Making ?41 year old male who presents to the ED today for asthma exacerbation triggered starting new job at [employer].  On arrival to the ED patient received 2 albuterol treatments with significant improvement.  O2 sats initially 90, went up to 100% on room air.  Remainder vitals unremarkable.  Patient reports mild improvement however states he still feels slight tightness in his  chest.  Will provide prednisone and reevaluate.  On exam he is speaking in shorter sentences however lungs are clear to auscultation currently without any wheezes, rales, or rhonchi.  He does note a mild sore throat -this year oropharynx mildly erythematous without edema or exudate.  Uvula midline.  Tolerating secretions without difficulty.  We will plan for strep, COVID, flu testing at this time.  Will reevaluate afterwards.  Patient is requesting refill of albuterol inhaler at this time as he is currently out.  Will provide with him prior to discharge.  ? ?Work-up overall reassuring.  Tests have been negative today.  On reevaluation patient  resting comfortably.  Reports improvement after prednisone.  Has albuterol inhaler at bedside.  Will discharge home with short course of prednisone for symptomatic relief of asthma exacerbation.  Patient advised to use albuterol inhaler as needed.  He is advised to follow-up with PCP.  Information given for [facility] for primary care needs.  Patient in agreement with plan and stable for discharge. ? ?Problems Addressed: ?Moderate persistent asthma with exacerbation: acute illness or injury ? ?Amount and/or Complexity of Data Reviewed ?Labs: ordered. ?   Details: COVID and flu negative ?Strep testing negative ? ?Risk ?Prescription drug management. ? ? ? ? ? ? ? ? ? ?Final Clinical Impression(s) / ED Diagnoses ?Final diagnoses:  ?Moderate persistent asthma with exacerbation  ? ? ?Rx / DC Orders ?ED Discharge Orders   ? ?      Ordered  ?  predniSONE (DELTASONE) 10 MG tablet  Daily       ? 07/16/21 0817  ? ?  ?  ? ?  ? ? ? ?Discharge Instructions   ? ?  ?Please pick up prednisone prescription and take as prescribed.  Continue using the albuterol inhaler as needed.  ? ?Follow-up with [facility] for primary care needs. ? ?Return to the ED for any new/worsening symptoms. ? ? ? ? ?  ?Eustaquio Maize, PA-C ?07/16/21 0818 ? ?  ?Malvin Johns, MD ?07/16/21  1443 ? ?

## 2021-08-09 ENCOUNTER — Other Ambulatory Visit: Payer: Self-pay

## 2021-08-09 ENCOUNTER — Emergency Department (HOSPITAL_COMMUNITY): Payer: Self-pay

## 2021-08-09 ENCOUNTER — Emergency Department (HOSPITAL_COMMUNITY)
Admission: EM | Admit: 2021-08-09 | Discharge: 2021-08-09 | Disposition: A | Discharge status: Home / Self Care | Payer: Self-pay | Attending: Emergency Medicine | Admitting: Emergency Medicine

## 2021-08-09 ENCOUNTER — Encounter (HOSPITAL_COMMUNITY): Payer: Self-pay

## 2021-08-09 ENCOUNTER — Emergency Department (HOSPITAL_BASED_OUTPATIENT_CLINIC_OR_DEPARTMENT_OTHER)
Admission: EM | Admit: 2021-08-09 | Discharge: 2021-08-09 | Disposition: A | Discharge status: Home / Self Care | Payer: Self-pay | Source: Home / Self Care

## 2021-08-09 DIAGNOSIS — Z760 Encounter for issue of repeat prescription: Secondary | ICD-10-CM | POA: Insufficient documentation

## 2021-08-09 DIAGNOSIS — M7989 Other specified soft tissue disorders: Secondary | ICD-10-CM

## 2021-08-09 DIAGNOSIS — Z7951 Long term (current) use of inhaled steroids: Secondary | ICD-10-CM | POA: Insufficient documentation

## 2021-08-09 DIAGNOSIS — M25561 Pain in right knee: Secondary | ICD-10-CM | POA: Insufficient documentation

## 2021-08-09 DIAGNOSIS — J45909 Unspecified asthma, uncomplicated: Secondary | ICD-10-CM | POA: Insufficient documentation

## 2021-08-09 MED ORDER — CELECOXIB 200 MG PO CAPS: 200.0000 mg | ORAL_CAPSULE | Freq: Two times a day (BID) | ORAL | 0 refills | Status: DC

## 2021-08-09 MED ORDER — IBUPROFEN 800 MG PO TABS
800.0000 mg | ORAL_TABLET | Freq: Once | ORAL | Status: AC
  Administered 2021-08-09: 800 mg via ORAL
  Filled 2021-08-09: qty 1

## 2021-08-09 NOTE — Progress Notes (Signed)
Right lower extremity venous duplex has been completed. ?Preliminary results can be found in CV Proc through chart review.  ?Results were given to Dr. Rhunette Croft. ? ?08/09/21 9:19 AM ?Olen Cordial RVT   ?

## 2021-08-09 NOTE — ED Notes (Signed)
I provided reinforced discharge education based off of discharge instructions. Pt acknowledged and understood my education. Pt had no further questions/concerns for provider/myself.  °

## 2021-08-09 NOTE — Discharge Instructions (Addendum)
Your xray showed ano acute abnormalities. Your DVT was negative. ?Contact a health care provider if: ?Your knee pain continues, changes, or gets worse. ?You have a fever along with knee pain. ?Your knee feels warm to the touch or is red. ?Your knee buckles or locks up. ?Get help right away if: ?Your knee swells, and the swelling becomes worse. ?You cannot move your knee. ?You have severe pain in your knee that cannot be managed with pain medicine. ?

## 2021-08-09 NOTE — ED Triage Notes (Addendum)
Per EMs- patient c/o wheezing. EMS gave Albuterol neb prior to coming to the ED with relief. ?Patient also c/o right knee pain. Patient states when he got out of bed this AM he felt a pop in his right knee. ?Patient also c/o atrial fib. Rate 75-110 for EMS. ? ?Patient also reports that the patient can not afford his meds and has not taken for some time. ?

## 2021-08-09 NOTE — ED Provider Notes (Signed)
?[facility]-EMERGENCY DEPT ?Provider Note ? ? ?CSN: [id number] ?Arrival date & time: 08/09/21  [id number] ? ?  ? ?History ? ?Chief Complaint  ?Patient presents with  ? Asthma  ? Knee Pain  ? Medication Refill  ? ? ?Devin [name] is a 41 y.o. male who presents emergency department with chief complaint of right knee pain.  EMS reports that the patient complains of a history of A-fib.  He states that he does not have a history of atrial fibrillation and does not know why that they said this.  EMS did not provide an EKG showing atrial fibrillation.  Patient has a history of asthma and states that he wakes up with shortness of breath usually every morning with a bit of wheezing.  He used his inhaler prior to arrival.  He is not complaining of any shortness of breath at this time.  Patient has been dealing with right knee pain for greater than 1 month.  He states that this morning when he stood up he felt a pop and had severe pain in the posterior section of his knee.  He has been able to ambulate. ? ? ?Asthma ? ?Knee Pain ?Medication Refill ? ?  ? ?Home Medications ?Prior to Admission medications   ?Medication Sig Start Date End Date Taking? Authorizing Provider  ?albuterol (VENTOLIN HFA) 108 (90 Base) MCG/ACT inhaler Inhale 1-2 puffs into the lungs every 6 (six) hours as needed for wheezing or shortness of breath. 03/19/21   Placido SouJoldersma, Logan, PA-C  ?fluticasone (FLOVENT HFA) 44 MCG/ACT inhaler Inhale 2 puffs into the lungs 2 (two) times daily. For asthma 10/02/20   Derwood KaplanNanavati, Ankit, MD  ?gabapentin (NEURONTIN) 400 MG capsule Take 1 capsule (400 mg total) by mouth 3 (three) times daily. For agitation ?Patient not taking: Reported on 04/08/2018 02/09/18   Armandina StammerNwoko, Agnes I, NP  ?hydrOXYzine (ATARAX/VISTARIL) 25 MG tablet Take 1 tablet (25 mg) by mouth three times daily & 2 tablets (50 mg) at bedtime for anxiety/sleep ?Patient not taking: Reported on 04/08/2018 02/09/18   Armandina StammerNwoko, Agnes I, NP  ?ibuprofen (ADVIL) 200  MG tablet Take 2 tablets (400 mg total) by mouth 2 (two) times daily as needed for moderate pain. 06/09/20   Jeannie FendMurphy, Laura A, PA-C  ?methocarbamol (ROBAXIN) 500 MG tablet Take 1 tablet (500 mg total) by mouth 2 (two) times daily. 08/07/20   Tanda RockersVenter, Margaux, PA-C  ?mirtazapine (REMERON) 30 MG tablet Take 1 tablet (30 mg total) by mouth at bedtime. For depression ?Patient not taking: Reported on 04/08/2018 02/09/18   Armandina StammerNwoko, Agnes I, NP  ?OLANZapine (ZYPREXA) 2.5 MG tablet Take 1 tablet (2.5 mg total) by mouth at bedtime. For mood control ?Patient not taking: Reported on 04/08/2018 02/09/18   Armandina StammerNwoko, Agnes I, NP  ?ondansetron (ZOFRAN) 4 MG tablet Take 1 tablet (4 mg total) by mouth every 6 (six) hours as needed for nausea or vomiting. 10/28/18   Terrilee FilesButler, Michael C, MD  ?pantoprazole (PROTONIX) 40 MG tablet Take 1 tablet (40 mg total) by mouth daily. For acid reflux 10/28/18   Terrilee FilesButler, Michael C, MD  ?promethazine (PHENERGAN) 25 MG tablet Take 1 tablet (25 mg total) by mouth every 6 (six) hours as needed for nausea. ?Patient not taking: Reported on 07/14/2018 06/04/18   Benjiman CorePickering, Nathan, MD  ?   ? ?Allergies    ?Augmentin [amoxicillin-pot clavulanate] and Other   ? ?Review of Systems   ?Review of Systems ? ?Physical Exam ?Updated Vital Signs ?BP (!) 131/98  Pulse 78   Temp 97.9 ?F (36.6 ?C) (Oral)   Resp 17   Ht 5\' 8"  (1.727 m)   Wt 85.7 kg   SpO2 98%   BMI 28.74 kg/m?  ?Physical Exam ?Vitals and nursing note reviewed.  ?Constitutional:   ?   General: He is not in acute distress. ?   Appearance: He is well-developed. He is not diaphoretic.  ?HENT:  ?   Head: Normocephalic and atraumatic.  ?Eyes:  ?   General: No scleral icterus. ?   Conjunctiva/sclera: Conjunctivae normal.  ?Cardiovascular:  ?   Rate and Rhythm: Normal rate and regular rhythm.  ?   Heart sounds: Normal heart sounds.  ?Pulmonary:  ?   Effort: Pulmonary effort is normal. No respiratory distress.  ?   Breath sounds: Normal breath sounds.  ?Abdominal:  ?    Palpations: Abdomen is soft.  ?   Tenderness: There is no abdominal tenderness.  ?Musculoskeletal:     ?   General: No swelling.  ?   Cervical back: Normal range of motion and neck supple.  ?   Comments: Tenderness to palpation along the lateral side of the patient's popliteal region.  There appears to be a little bit of swelling, no bruising.  No tenderness along the joint line.  Normal flexion and extension at the knee, negative Thompson's test.  ?Skin: ?   General: Skin is warm and dry.  ?Neurological:  ?   Mental Status: He is alert.  ?Psychiatric:     ?   Behavior: Behavior normal.  ? ? ?ED Results / Procedures / Treatments   ?Labs ?(all labs ordered are listed, but only abnormal results are displayed) ?Labs Reviewed - No data to display ? ?EKG ?None ? ?Radiology ?DG Knee Complete 4 Views Right ? ?Result Date: 08/09/2021 ?CLINICAL DATA:  RIGHT knee pain EXAM: RIGHT KNEE - COMPLETE 4+ VIEW COMPARISON:  None. FINDINGS: No fracture of the proximal tibia or distal femur. Patella is normal. No joint effusion. The patella appears high riding. IMPRESSION: 1. No fracture dislocation.  No joint effusion. 2. Patella appears high riding. Consider 30 degree lateral flexion view to exclude patella 08/11/2021. Electronically Signed   By: Oda Kilts M.D.   On: 08/09/2021 08:28   ? ?Procedures ?Procedures  ? ? ?Medications Ordered in ED ?Medications - No data to display ? ?ED Course/ Medical Decision Making/ A&P ?  ?                        ?Medical Decision Making ?Patient X-Ray negative for obvious fracture or dislocation.  Although patella appears to be high riding on the x-ray he does not have any evidence of quadricep tendon tendon disruption.  He is able to flex and extend at the knee.  Pain is in the lateral posterior popliteal region.  He does not have evidence of DVT.  He does not have evidence of hamstring tendon disruption.  He does not have obvious evidence of a Baker's cyst.  He may have some internal derangement of  the knee.  Patient given knee immobilizer and crutches along with anti-inflammatory medications pain managed in ED. Pt advised to follow up with orthopedics if symptoms persist for possibility of missed fracture diagnosis. Patient given brace while in ED, conservative therapy recommended and discussed. Patient will be dc home & is agreeable with above plan. ? ? ?Problems Addressed: ?Acute pain of right knee: acute illness or injury ? ?Amount and/or  Complexity of Data Reviewed ?Radiology: ordered and independent interpretation performed. ?   Details: I personally visualized the x-ray and agree with radiologic interpretation.  I do not think the patient has clinical evidence of tendon disruption causing high riding pole patella. ?ECG/medicine tests: ordered. ? ?Risk ?Prescription drug management. ? ?Final Clinical Impression(s) / ED Diagnoses ?Final diagnoses:  ?None  ? ? ?Rx / DC Orders ?ED Discharge Orders   ? ? None  ? ?  ? ? ?  ?Arthor Captain, PA-C ?08/09/21 1547 ? ?  ?Lorre Nick, MD ?08/10/21 0845 ? ?

## 2021-08-27 ENCOUNTER — Encounter (HOSPITAL_COMMUNITY): Payer: Self-pay

## 2021-08-27 ENCOUNTER — Observation Stay (HOSPITAL_COMMUNITY)
Admission: EM | Admit: 2021-08-27 | Discharge: 2021-08-28 | Disposition: A | Discharge status: Home / Self Care | Payer: Self-pay | Attending: Internal Medicine | Admitting: Internal Medicine

## 2021-08-27 ENCOUNTER — Emergency Department (HOSPITAL_COMMUNITY): Payer: Self-pay

## 2021-08-27 DIAGNOSIS — F12988 Cannabis use, unspecified with other cannabis-induced disorder: Principal | ICD-10-CM | POA: Insufficient documentation

## 2021-08-27 DIAGNOSIS — J449 Chronic obstructive pulmonary disease, unspecified: Secondary | ICD-10-CM | POA: Insufficient documentation

## 2021-08-27 DIAGNOSIS — Z20822 Contact with and (suspected) exposure to covid-19: Secondary | ICD-10-CM | POA: Insufficient documentation

## 2021-08-27 DIAGNOSIS — F3181 Bipolar II disorder: Secondary | ICD-10-CM | POA: Insufficient documentation

## 2021-08-27 DIAGNOSIS — F1994 Other psychoactive substance use, unspecified with psychoactive substance-induced mood disorder: Secondary | ICD-10-CM | POA: Diagnosis present

## 2021-08-27 DIAGNOSIS — E876 Hypokalemia: Secondary | ICD-10-CM | POA: Insufficient documentation

## 2021-08-27 DIAGNOSIS — Z87891 Personal history of nicotine dependence: Secondary | ICD-10-CM | POA: Insufficient documentation

## 2021-08-27 DIAGNOSIS — J45901 Unspecified asthma with (acute) exacerbation: Secondary | ICD-10-CM | POA: Insufficient documentation

## 2021-08-27 DIAGNOSIS — F1290 Cannabis use, unspecified, uncomplicated: Secondary | ICD-10-CM

## 2021-08-27 DIAGNOSIS — Z79899 Other long term (current) drug therapy: Secondary | ICD-10-CM | POA: Insufficient documentation

## 2021-08-27 LAB — CBC WITH DIFFERENTIAL/PLATELET
Abs Immature Granulocytes: 0.03 10*3/uL (ref 0.00–0.07)
Basophils Absolute: 0 10*3/uL (ref 0.0–0.1)
Basophils Relative: 0 %
Eosinophils Absolute: 0.2 10*3/uL (ref 0.0–0.5)
Eosinophils Relative: 3 %
HCT: 40.2 % (ref 39.0–52.0)
Hemoglobin: 14.2 g/dL (ref 13.0–17.0)
Immature Granulocytes: 0 %
Lymphocytes Relative: 31 %
Lymphs Abs: 2.5 10*3/uL (ref 0.7–4.0)
MCH: 33.6 pg (ref 26.0–34.0)
MCHC: 35.3 g/dL (ref 30.0–36.0)
MCV: 95.3 fL (ref 80.0–100.0)
Monocytes Absolute: 0.7 10*3/uL (ref 0.1–1.0)
Monocytes Relative: 8 %
Neutro Abs: 4.5 10*3/uL (ref 1.7–7.7)
Neutrophils Relative %: 58 %
Platelets: 309 10*3/uL (ref 150–400)
RBC: 4.22 MIL/uL (ref 4.22–5.81)
RDW: 12.2 % (ref 11.5–15.5)
WBC: 7.9 10*3/uL (ref 4.0–10.5)
nRBC: 0 % (ref 0.0–0.2)

## 2021-08-27 LAB — COMPREHENSIVE METABOLIC PANEL
ALT: 33 U/L (ref 0–44)
AST: 41 U/L (ref 15–41)
Albumin: 3.7 g/dL (ref 3.5–5.0)
Alkaline Phosphatase: 53 U/L (ref 38–126)
Anion gap: 12 (ref 5–15)
BUN: 12 mg/dL (ref 6–20)
CO2: 22 mmol/L (ref 22–32)
Calcium: 8.4 mg/dL — ABNORMAL LOW (ref 8.9–10.3)
Chloride: 105 mmol/L (ref 98–111)
Creatinine, Ser: 0.84 mg/dL (ref 0.61–1.24)
GFR, Estimated: >60 mL/min (ref >60)
Glucose, Bld: 105 mg/dL — ABNORMAL HIGH (ref 70–99)
Potassium: 3 mmol/L — ABNORMAL LOW (ref 3.5–5.1)
Sodium: 139 mmol/L (ref 135–145)
Total Bilirubin: 0.3 mg/dL (ref 0.3–1.2)
Total Protein: 6.8 g/dL (ref 6.5–8.1)

## 2021-08-27 LAB — RESP PANEL BY RT-PCR (FLU A&B, COVID) ARPGX2
Influenza A by PCR: NEGATIVE
Influenza B by PCR: NEGATIVE
SARS Coronavirus 2 by RT PCR: NEGATIVE

## 2021-08-27 LAB — HIV ANTIBODY (ROUTINE TESTING W REFLEX): HIV Screen 4th Generation wRfx: NONREACTIVE

## 2021-08-27 LAB — PHOSPHORUS: Phosphorus: 4.1 mg/dL (ref 2.5–4.6)

## 2021-08-27 MED ORDER — METHYLPREDNISOLONE SODIUM SUCC 40 MG IJ SOLR
40.0000 mg | Freq: Two times a day (BID) | INTRAMUSCULAR | Status: DC
  Administered 2021-08-27: 40 mg via INTRAVENOUS
  Filled 2021-08-27: qty 1

## 2021-08-27 MED ORDER — ALBUTEROL SULFATE (2.5 MG/3ML) 0.083% IN NEBU
10.0000 mg/h | INHALATION_SOLUTION | Freq: Once | RESPIRATORY_TRACT | Status: AC
Start: 2021-08-27 — End: 2021-08-27
  Administered 2021-08-27: 10 mg/h via RESPIRATORY_TRACT
  Filled 2021-08-27: qty 12

## 2021-08-27 MED ORDER — GUAIFENESIN-DM 100-10 MG/5ML PO SYRP: 5.0000 mL | ORAL_SOLUTION | ORAL | Status: DC | PRN

## 2021-08-27 MED ORDER — IPRATROPIUM-ALBUTEROL 0.5-2.5 (3) MG/3ML IN SOLN
3.0000 mL | Freq: Two times a day (BID) | RESPIRATORY_TRACT | Status: DC
  Administered 2021-08-28: 3 mL via RESPIRATORY_TRACT
  Filled 2021-08-27: qty 3

## 2021-08-27 MED ORDER — IPRATROPIUM-ALBUTEROL 0.5-2.5 (3) MG/3ML IN SOLN
3.0000 mL | Freq: Four times a day (QID) | RESPIRATORY_TRACT | Status: DC
  Administered 2021-08-27: 3 mL via RESPIRATORY_TRACT
  Filled 2021-08-27: qty 3

## 2021-08-27 MED ORDER — ENOXAPARIN SODIUM 40 MG/0.4ML IJ SOSY
40.0000 mg | PREFILLED_SYRINGE | INTRAMUSCULAR | Status: DC
  Administered 2021-08-27: 40 mg via SUBCUTANEOUS
  Filled 2021-08-27: qty 0.4

## 2021-08-27 MED ORDER — POTASSIUM CHLORIDE 2 MEQ/ML IV SOLN
INTRAVENOUS | Status: AC
  Filled 2021-08-27 (×2): qty 1000

## 2021-08-27 MED ORDER — GABAPENTIN 400 MG PO CAPS
400.0000 mg | ORAL_CAPSULE | Freq: Three times a day (TID) | ORAL | Status: DC
  Administered 2021-08-27 (×3): 400 mg via ORAL
  Filled 2021-08-27 (×3): qty 1

## 2021-08-27 MED ORDER — IBUPROFEN 200 MG PO TABS
400.0000 mg | ORAL_TABLET | Freq: Two times a day (BID) | ORAL | Status: DC | PRN
  Administered 2021-08-27: 400 mg via ORAL
  Filled 2021-08-27: qty 2

## 2021-08-27 MED ORDER — IPRATROPIUM-ALBUTEROL 0.5-2.5 (3) MG/3ML IN SOLN
3.0000 mL | Freq: Three times a day (TID) | RESPIRATORY_TRACT | Status: DC
  Administered 2021-08-27: 3 mL via RESPIRATORY_TRACT
  Filled 2021-08-27: qty 3

## 2021-08-27 MED ORDER — CELECOXIB 200 MG PO CAPS
200.0000 mg | ORAL_CAPSULE | Freq: Two times a day (BID) | ORAL | Status: DC
Start: 2021-08-27 — End: 2021-08-27
  Filled 2021-08-27: qty 1

## 2021-08-27 MED ORDER — TRAZODONE HCL 50 MG PO TABS
50.0000 mg | ORAL_TABLET | Freq: Every day | ORAL | Status: DC
Start: 2021-08-27 — End: 2021-08-28
  Administered 2021-08-27: 50 mg via ORAL
  Filled 2021-08-27: qty 1

## 2021-08-27 MED ORDER — ALBUTEROL SULFATE (2.5 MG/3ML) 0.083% IN NEBU
3.0000 mL | INHALATION_SOLUTION | Freq: Four times a day (QID) | RESPIRATORY_TRACT | Status: DC | PRN
  Administered 2021-08-27 – 2021-08-28 (×2): 3 mL via RESPIRATORY_TRACT
  Filled 2021-08-27 (×2): qty 3

## 2021-08-27 MED ORDER — POTASSIUM CHLORIDE CRYS ER 20 MEQ PO TBCR
40.0000 meq | EXTENDED_RELEASE_TABLET | Freq: Two times a day (BID) | ORAL | Status: AC
  Administered 2021-08-27 (×2): 40 meq via ORAL
  Filled 2021-08-27 (×2): qty 2

## 2021-08-27 MED ORDER — PANTOPRAZOLE SODIUM 40 MG PO TBEC
40.0000 mg | DELAYED_RELEASE_TABLET | Freq: Every day | ORAL | Status: DC
  Administered 2021-08-27: 40 mg via ORAL
  Filled 2021-08-27: qty 1

## 2021-08-27 MED ORDER — POLYETHYLENE GLYCOL 3350 17 G PO PACK
17.0000 g | PACK | Freq: Every day | ORAL | Status: DC | PRN
Start: 2021-08-27 — End: 2021-08-28

## 2021-08-27 MED ORDER — ACETAMINOPHEN 325 MG PO TABS
650.0000 mg | ORAL_TABLET | Freq: Four times a day (QID) | ORAL | Status: DC | PRN
  Administered 2021-08-27: 650 mg via ORAL
  Filled 2021-08-27 (×2): qty 2

## 2021-08-27 MED ORDER — IPRATROPIUM-ALBUTEROL 0.5-2.5 (3) MG/3ML IN SOLN
3.0000 mL | RESPIRATORY_TRACT | Status: DC
  Administered 2021-08-27 (×2): 3 mL via RESPIRATORY_TRACT
  Filled 2021-08-27 (×2): qty 3

## 2021-08-27 MED ORDER — SODIUM CHLORIDE 0.9 % IV BOLUS
1000.0000 mL | Freq: Once | INTRAVENOUS | Status: AC
Start: 2021-08-27 — End: 2021-08-27
  Administered 2021-08-27: 1000 mL via INTRAVENOUS

## 2021-08-27 MED ORDER — MELATONIN 3 MG PO TABS: 3.0000 mg | ORAL_TABLET | Freq: Every evening | ORAL | Status: DC | PRN

## 2021-08-27 MED ORDER — PROCHLORPERAZINE EDISYLATE 10 MG/2ML IJ SOLN: 10.0000 mg | Freq: Four times a day (QID) | INTRAMUSCULAR | Status: DC | PRN

## 2021-08-27 MED ORDER — METHYLPREDNISOLONE SODIUM SUCC 125 MG IJ SOLR
80.0000 mg | Freq: Two times a day (BID) | INTRAMUSCULAR | Status: DC
  Administered 2021-08-27 (×2): 80 mg via INTRAVENOUS
  Filled 2021-08-27 (×2): qty 2

## 2021-08-27 NOTE — ED Notes (Signed)
Patient states he still feels tight. MD is aware.  ?

## 2021-08-27 NOTE — H&P (Addendum)
History and Physical  Myers Botz NFA:[id number] DOB: [birth year] DOA: 08/27/2021  Referring physician: Micheal Likens PA-EDP  PCP: Patient, No Pcp Per (Inactive)  Outpatient Specialists: None. Patient coming from: Home.  Chief Complaint: Cough, shortness of breath.  HPI: Devin [name] is a 41 y.o. male with medical history significant for asthma, documented COPD but no prior PFT evaluation, ongoing THC use, bipolar 1 disorder, presents to the [facility] ED with complaints of sudden onset shortness of breath wheezing and intermittent dry cough that woke him out of his sleep last night.  He used his home nebulizer treatments without any relief.  EMS was activated.  He received additional DuoNebs treatments x2 via EMS with mild improvement.  He was brought into the ED for further evaluation.  In the ED, he was noted to have audible wheezing and was placed on continuous nebs.  Additionally, he received 1 L IV fluid bolus normal saline x1.  No subjective fevers.  No chills.  Denies having a productive cough or purulent sputum.  COVID-19 screening test, influenza A, influenza B, all negative.  TRH, hospitalist service, was asked to admit.  ED Course: Tmax 98.9.  BP 128/89, pulse 105, respiration rate 18, saturation 95% on room air.  Lab studies remarkable for serum potassium 3.0, repleted orally.  CBC is essentially unremarkable.  Review of Systems: Review of systems as noted in the HPI. All other systems reviewed and are negative.   Past Medical History:  Diagnosis Date   ADHD (attention deficit hyperactivity disorder)    Asthma    Bipolar 1 disorder (HCC)    GERD (gastroesophageal reflux disease)    Transaminitis 07/17/2015   Past Surgical History:  Procedure Laterality Date   TYMPANOPLASTY Left 1997    Social History:  reports that he quit smoking about 2 years ago. His smoking use included cigarettes. He smoked an average of .5 packs per day. He has never used smokeless tobacco. He reports current  alcohol use. He reports current drug use. Drug: Marijuana.   Allergies  Allergen Reactions   Augmentin [Amoxicillin-Pot Clavulanate] Hives    Tolerates Amoxicillin - Thuy 09/29/12   Other Nausea And Vomiting    coconut    Family History  Problem Relation Age of Onset   Coronary artery disease Father    Heart attack Father    Heart disease Father    Heart failure Mother    Hyperlipidemia Mother    Hypertension Mother    Migraines Mother    COPD Mother    Diabetes Maternal Aunt    Asthma Maternal Grandfather    Asthma Paternal Grandfather       Prior to Admission medications   Medication Sig Start Date End Date Taking? Authorizing Provider  albuterol (VENTOLIN HFA) 108 (90 Base) MCG/ACT inhaler Inhale 1-2 puffs into the lungs every 6 (six) hours as needed for wheezing or shortness of breath. 03/19/21   Placido Sou, PA-C  celecoxib (CELEBREX) 200 MG capsule Take 1 capsule (200 mg total) by mouth 2 (two) times daily. 08/09/21   Harris, Abigail, PA-C  fluticasone (FLOVENT HFA) 44 MCG/ACT inhaler Inhale 2 puffs into the lungs 2 (two) times daily. For asthma 10/02/20   Derwood Kaplan, MD  gabapentin (NEURONTIN) 400 MG capsule Take 1 capsule (400 mg total) by mouth 3 (three) times daily. For agitation Patient not taking: Reported on 04/08/2018 02/09/18   Armandina Stammer I, NP  hydrOXYzine (ATARAX/VISTARIL) 25 MG tablet Take 1 tablet (25 mg) by mouth three times daily &  2 tablets (50 mg) at bedtime for anxiety/sleep Patient not taking: Reported on 04/08/2018 02/09/18   Armandina Stammer I, NP  ibuprofen (ADVIL) 200 MG tablet Take 2 tablets (400 mg total) by mouth 2 (two) times daily as needed for moderate pain. 06/09/20   Jeannie Fend, PA-C  methocarbamol (ROBAXIN) 500 MG tablet Take 1 tablet (500 mg total) by mouth 2 (two) times daily. 08/07/20   Hyman Hopes, Margaux, PA-C  mirtazapine (REMERON) 30 MG tablet Take 1 tablet (30 mg total) by mouth at bedtime. For depression Patient not taking:  Reported on 04/08/2018 02/09/18   Armandina Stammer I, NP  OLANZapine (ZYPREXA) 2.5 MG tablet Take 1 tablet (2.5 mg total) by mouth at bedtime. For mood control Patient not taking: Reported on 04/08/2018 02/09/18   Armandina Stammer I, NP  ondansetron (ZOFRAN) 4 MG tablet Take 1 tablet (4 mg total) by mouth every 6 (six) hours as needed for nausea or vomiting. 10/28/18   Terrilee Files, MD  pantoprazole (PROTONIX) 40 MG tablet Take 1 tablet (40 mg total) by mouth daily. For acid reflux 10/28/18   Terrilee Files, MD  promethazine (PHENERGAN) 25 MG tablet Take 1 tablet (25 mg total) by mouth every 6 (six) hours as needed for nausea. Patient not taking: Reported on 07/14/2018 06/04/18   Benjiman Core, MD    Physical Exam: BP (!) 144/91   Pulse (!) 107   Temp 98.9 F (37.2 C) (Oral)   Resp 18   SpO2 93%   General: 41 y.o. year-old male well developed well nourished in no acute distress.  Alert and oriented x3. Cardiovascular: Tachycardic with no rubs or gallops.  No thyromegaly or JVD noted.  No lower extremity edema. 2/4 pulses in all 4 extremities. Respiratory: Diffuse wheezing bilaterally.  Poor inspiratory effort. Abdomen: Soft nontender nondistended with normal bowel sounds x4 quadrants. Muskuloskeletal: No cyanosis, clubbing or edema noted bilaterally Neuro: CN II-XII intact, strength, sensation, reflexes Skin: No ulcerative lesions noted or rashes Psychiatry: Judgement and insight appear normal. Mood is appropriate for condition and setting          Labs on Admission:  Basic Metabolic Panel: Recent Labs  Lab 08/27/21 0138  NA 139  K 3.0*  CL 105  CO2 22  GLUCOSE 105*  BUN 12  CREATININE 0.84  CALCIUM 8.4*   Liver Function Tests: Recent Labs  Lab 08/27/21 0138  AST 41  ALT 33  ALKPHOS 53  BILITOT 0.3  PROT 6.8  ALBUMIN 3.7   No results for input(s): LIPASE, AMYLASE in the last 168 hours. No results for input(s): AMMONIA in the last 168 hours. CBC: Recent Labs  Lab  08/27/21 0138  WBC 7.9  NEUTROABS 4.5  HGB 14.2  HCT 40.2  MCV 95.3  PLT 309   Cardiac Enzymes: No results for input(s): CKTOTAL, CKMB, CKMBINDEX, TROPONINI in the last 168 hours.  BNP (last 3 results) No results for input(s): BNP in the last 8760 hours.  ProBNP (last 3 results) No results for input(s): PROBNP in the last 8760 hours.  CBG: No results for input(s): GLUCAP in the last 168 hours.  Radiological Exams on Admission: DG Chest Portable 1 View  Result Date: 08/27/2021 CLINICAL DATA:  Shortness of breath. EXAM: PORTABLE CHEST 1 VIEW COMPARISON:  Chest radiograph dated 03/19/2021. FINDINGS: The heart size and mediastinal contours are within normal limits. Both lungs are clear. The visualized skeletal structures are unremarkable. IMPRESSION: No active disease. Electronically Signed   By: Burtis Junes  Radparvar M.D.   On: 08/27/2021 02:20    EKG: I independently viewed the EKG done and my findings are as followed: Sinus tachycardia rate of 106.  Nonspecific ST-T changes.  QTc 453.  Assessment/Plan Present on Admission:  Asthma exacerbation  Active Problems:   Asthma exacerbation  Acute asthma exacerbation Sudden onset of symptoms late last night No fever no chills, no purulent sputum-low suspicion for pulmonary infection, will hold off azithromycin or oral antibiotics for now. Personally reviewed chest x-ray done on admission which shows no pulmonary infiltrates. Continue nebulizer treatment DuoNebs every 4 hours Added IV Solu-Medrol 80 mg twice daily Antitussives as needed Incentive spirometer Mobilize as tolerated Follow-up on add-on phosphorus level, replete if low.  Hypokalemia Serum potassium 3.0 Repleted orally with 40 mEq x 2 doses Repeat BMP in the morning  Sinus tachycardia, suspect response from albuterol versus mild dehydration We will start gentle IV fluid hydration LR KCl at 50 cc/h x 1 day Continue to monitor on telemetry  THC use disorder Ongoing,  last use was on Friday, 08/24/2021  Polysubstance abuse cessation counseling done at bedside.  Bipolar 1 disorder Resume home regimen once medications have been reconciled by pharmacy.  GERD Resume home PPI, Protonix 40 mg daily  Situational insomnia Added trazodone 50 mg nightly.   DVT prophylaxis: Subcu Lovenox daily  Code Status: Full code  Family Communication: None at bedside  Disposition Plan: Admitted to telemetry unit  Consults called: None  Admission status: Observation status.     Status is: Observation    Darlin Drop MD [facility] Pager [phone]  If 7PM-7AM, please contact night-coverage [email or web address] Password [facility]  08/27/2021, 5:47 AM

## 2021-08-27 NOTE — ED Triage Notes (Signed)
Patient brought in by EMS. EMS had seen the patient previously however patient declined being transported. Patient has had 3 duonebs with EMS. Patient has diminished lung sounds. EMS also gave albuterol, solumedrol, and magnesium. ?

## 2021-08-27 NOTE — Progress Notes (Signed)
[facility] ?PROGRESS NOTE ? ? ? ?Progress Note  ?Devin [name]  DDU:[id number] DOB: 07/20/1980 DOA: 08/27/2021 ?PCP: Patient, No Pcp Per (Inactive)  ? ? ? ?Brief Narrative:  ? ?Devin [name] is an 41 y.o. male past medical history of asthma cannabis use and bipolar disorder comes in with shortness of breath brought in by EMS was found to be hypoxic with a heart rate of 110, was admitted for acute asthma exacerbation. ? ? ? ? ? ?Assessment/Plan:  ? ?Acute asthma exacerbation: ?Agree with IV fluids and IV steroids he still wheezing satting about 88% with a heart rate of 110. ?We will continue IV steroids. ?Has remained afebrile with no leukocytosis. ?Ambulating check saturations with ambulation. ? ?Hypokalemia: ?Likely due to albuterol replete orally. ? ?Substance induced mood disorder (HCC) ?Counseling. ? ?Bipolar II disorder (HCC) ?Continue Neurontin. ? ?DVT prophylaxis: lovenox ?Family Communication:none ?Status is: Observation ?The patient will require care spanning > 2 midnights and should be moved to inpatient because: Asthma exacerbation with borderline hypoxia and tachycardic. ? ? ? ?Code Status:  ? ?  ?Code Status Orders  ?(From admission, onward)  ?  ? ? ?  ? ?  Start     Ordered  ? 08/27/21 0425  Full code  Continuous       ? 08/27/21 0424  ? ?  ?  ? ?  ? ?Code Status History   ? ? Date Active Date Inactive Code Status Order ID Comments User Context  ? 03/07/2018 2149 03/08/2018 1355 Full Code [id number]  [name], MD ED  ? 03/06/2018 2040 03/07/2018 1722 Full Code [id number]  [name], MD ED  ? 03/05/2018 1050 03/06/2018 1706 Full Code [id number]  [name], MD ED  ? 01/31/2018 1715 02/09/2018 1907 Full Code [id number]  [name], NP Inpatient  ? 01/31/2018 1358 01/31/2018 1537 Full Code [id number]  [name], PA-C ED  ? 06/28/2017 2031 06/29/2017 1544 Full Code [id number]  [name], PA-C ED  ? 03/21/2017 2325 03/22/2017 1818 Full Code [id number]  Ward, [name], PA-C  ED  ? 11/03/2015 1737 11/06/2015 1657 Full Code [id number]  [name], MD Inpatient  ? 07/17/2015 0532 07/20/2015 1700 Full Code [id number]  [name], DO ED  ? 06/08/2014 0844 06/09/2014 1500 Full Code [id number]  [name], MD Inpatient  ? 10/14/2011 1553 10/17/2011 1821 Full Code [id number]  [name], RN Inpatient  ? 10/13/2011 1552 10/14/2011 1553 Full Code [id number]  [name], PA ED  ? ?  ? ? ? ? ?IV Access:  ? ?Peripheral IV ? ? ?Procedures and diagnostic studies:  ? ?DG Chest Portable 1 View ? ?Result Date: 08/27/2021 ?CLINICAL DATA:  Shortness of breath. EXAM: PORTABLE CHEST 1 VIEW COMPARISON:  Chest radiograph dated 03/19/2021. FINDINGS: The heart size and mediastinal contours are within normal limits. Both lungs are clear. The visualized skeletal structures are unremarkable. IMPRESSION: No active disease. Electronically Signed   By: Elgie Collard M.D.   On: 08/27/2021 02:20   ? ? ?Medical Consultants:  ? ?None. ? ? ?Subjective:  ? ? ?[name] relates his breathing is not better than yesterday ? ?Objective:  ? ? ?Vitals:  ? 08/27/21 0600 08/27/21 0700 08/27/21 0818 08/27/21 0900  ?BP: 128/89 (!) 140/95  130/83  ?Pulse: (!) 104 (!) 106  (!) 102  ?Resp: 15 (!) 21  20  ?Temp:      ?TempSrc:      ?SpO2: 95% 95% 96% 92%  ? ?SpO2:  92 % ? ?No intake or output data in the 24 hours ending 08/27/21 0953 ?There were no vitals filed for this visit. ? ?Exam: ?General exam: In no acute distress. ?Respiratory system: Good air movement with wheezing bilaterally. ?Cardiovascular system: S1 & S2 heard, RRR. No JVD. ?Gastrointestinal system: Abdomen is nondistended, soft and nontender.  ?Extremities: No pedal edema. ?Skin: No rashes, lesions or ulcers ?Psychiatry: Judgement and insight appear normal. Mood & affect appropriate.  ? ? ?Data Reviewed:  ? ? ?Labs: ?Basic Metabolic Panel: ?Recent Labs  ?Lab 08/27/21 ?0138  ?NA 139  ?K 3.0*  ?CL 105  ?CO2 22  ?GLUCOSE 105*  ?BUN 12  ?CREATININE 0.84   ?CALCIUM 8.4*  ?PHOS 4.1  ? ?GFR ?CrCl cannot be calculated (Unknown ideal weight.). ?Liver Function Tests: ?Recent Labs  ?Lab 08/27/21 ?0138  ?AST 41  ?ALT 33  ?ALKPHOS 53  ?BILITOT 0.3  ?PROT 6.8  ?ALBUMIN 3.7  ? ?No results for input(s): LIPASE, AMYLASE in the last 168 hours. ?No results for input(s): AMMONIA in the last 168 hours. ?Coagulation profile ?No results for input(s): INR, PROTIME in the last 168 hours. ?COVID-19 Labs ? ?No results for input(s): DDIMER, FERRITIN, LDH, CRP in the last 72 hours. ? ?Lab Results  ?Component Value Date  ? SARSCOV2NAA NEGATIVE 08/27/2021  ? SARSCOV2NAA NEGATIVE 07/16/2021  ? SARSCOV2NAA POSITIVE (A) 12/31/2020  ? SARSCOV2NAA NEGATIVE 08/25/2020  ? ? ?CBC: ?Recent Labs  ?Lab 08/27/21 ?0138  ?WBC 7.9  ?NEUTROABS 4.5  ?HGB 14.2  ?HCT 40.2  ?MCV 95.3  ?PLT 309  ? ?Cardiac Enzymes: ?No results for input(s): CKTOTAL, CKMB, CKMBINDEX, TROPONINI in the last 168 hours. ?BNP (last 3 results) ?No results for input(s): PROBNP in the last 8760 hours. ?CBG: ?No results for input(s): GLUCAP in the last 168 hours. ?D-Dimer: ?No results for input(s): DDIMER in the last 72 hours. ?Hgb A1c: ?No results for input(s): HGBA1C in the last 72 hours. ?Lipid Profile: ?No results for input(s): CHOL, HDL, LDLCALC, TRIG, CHOLHDL, LDLDIRECT in the last 72 hours. ?Thyroid function studies: ?No results for input(s): TSH, T4TOTAL, T3FREE, THYROIDAB in the last 72 hours. ? ?Invalid input(s): FREET3 ?Anemia work up: ?No results for input(s): VITAMINB12, FOLATE, FERRITIN, TIBC, IRON, RETICCTPCT in the last 72 hours. ?Sepsis Labs: ?Recent Labs  ?Lab 08/27/21 ?0138  ?WBC 7.9  ? ?Microbiology ?Recent Results (from the past 240 hour(s))  ?Resp Panel by RT-PCR (Flu A&B, Covid) Nasopharyngeal Swab     Status: None  ? Collection Time: 08/27/21  1:35 AM  ? Specimen: Nasopharyngeal Swab; Nasopharyngeal(NP) swabs in vial transport medium  ?Result Value Ref Range Status  ? SARS Coronavirus 2 by RT PCR NEGATIVE NEGATIVE  Final  ?  Comment: (NOTE) ?SARS-CoV-2 target nucleic acids are NOT DETECTED. ? ?The SARS-CoV-2 RNA is generally detectable in upper respiratory ?specimens during the acute phase of infection. The lowest ?concentration of SARS-CoV-2 viral copies this assay can detect is ?138 copies/mL. A negative result does not preclude SARS-Cov-2 ?infection and should not be used as the sole basis for treatment or ?other patient management decisions. A negative result may occur with  ?improper specimen collection/handling, submission of specimen other ?than nasopharyngeal swab, presence of viral mutation(s) within the ?areas targeted by this assay, and inadequate number of viral ?copies(<138 copies/mL). A negative result must be combined with ?clinical observations, patient history, and epidemiological ?information. The expected result is Negative. ? ?Fact Sheet for Patients:  ?BloggerCourse.com ? ?Fact Sheet for Healthcare Providers:  ?SeriousBroker.it ? ?This  test is no t yet approved or cleared by the [place] FDA and  ?has been authorized for detection and/or diagnosis of SARS-CoV-2 by ?FDA under an Emergency Use Authorization (EUA). This EUA will remain  ?in effect (meaning this test can be used) for the duration of the ?COVID-19 declaration under Section 564(b)(1) of the Act, 21 ?U.S.C.section 360bbb-3(b)(1), unless the authorization is terminated  ?or revoked sooner.  ? ? ?  ? Influenza A by PCR NEGATIVE NEGATIVE Final  ? Influenza B by PCR NEGATIVE NEGATIVE Final  ?  Comment: (NOTE) ?The Xpert Xpress SARS-CoV-2/FLU/RSV plus assay is intended as an aid ?in the diagnosis of influenza from Nasopharyngeal swab specimens and ?should not be used as a sole basis for treatment. Nasal washings and ?aspirates are unacceptable for Xpert Xpress SARS-CoV-2/FLU/RSV ?testing. ? ?Fact Sheet for Patients: ?BloggerCourse.comhttps://www.fda.gov/media/152166/download ? ?Fact Sheet for Healthcare  Providers: ?SeriousBroker.ithttps://www.fda.gov/media/152162/download ? ?This test is not yet approved or cleared by the [place] FDA and ?has been authorized for detection and/or diagnosis of SARS-CoV-2 by ?FDA under an Emerg

## 2021-08-27 NOTE — ED Provider Notes (Signed)
[facility] ?Provider Note ? ? ?CSN: [id number] ?Arrival date & time: 08/27/21  0102 ? ?  ? ?History ? ?Chief Complaint  ?Patient presents with  ? Shortness of Breath  ? ? ?Devin [name] is a 41 y.o. male with a history of asthma, COPD, bipolar 1 disorder, and substance use who presents to the emergency department via EMS for shortness of breath throughout the day today that has progressively worsened.  Patient states that he woke up with shortness of breath, wheezing, and intermittent coughing.  He has been utilizing his nebulizer treatments at home with minimal relief, tonight things got worse therefore he called EMS, they came to evaluate him and gave him 2 DuoNebs with some mild improvement and he did not wish to have transport.  Approximately 30 minutes later he called them back to his residence as he was feeling much worse.  Per EMS patient with poor air movement and diffuse wheezing SPO2 okay on room air initially, has received a total of 3 DuoNeb's, 1 additional albuterol nebulizer treatment, as well as magnesium and Solu-Medrol.  Currently patient states he feels mildly improved.  He states that he has had to be admitted as well as intubated for his asthma in the past.  He states this feels similar to admission not intubation.  He denies chest pain, nausea, vomiting, abdominal pain, leg pain/swelling, or hemoptysis. ? ?HPI ? ?  ? ?Home Medications ?Prior to Admission medications   ?Medication Sig Start Date End Date Taking? Authorizing Provider  ?albuterol (VENTOLIN HFA) 108 (90 Base) MCG/ACT inhaler Inhale 1-2 puffs into the lungs every 6 (six) hours as needed for wheezing or shortness of breath. 03/19/21   Placido Sou, PA-C  ?celecoxib (CELEBREX) 200 MG capsule Take 1 capsule (200 mg total) by mouth 2 (two) times daily. 08/09/21   Arthor Captain, PA-C  ?fluticasone (FLOVENT HFA) 44 MCG/ACT inhaler Inhale 2 puffs into the lungs 2 (two) times daily. For asthma 10/02/20    Derwood Kaplan, MD  ?gabapentin (NEURONTIN) 400 MG capsule Take 1 capsule (400 mg total) by mouth 3 (three) times daily. For agitation ?Patient not taking: Reported on 04/08/2018 02/09/18   Armandina Stammer I, NP  ?hydrOXYzine (ATARAX/VISTARIL) 25 MG tablet Take 1 tablet (25 mg) by mouth three times daily & 2 tablets (50 mg) at bedtime for anxiety/sleep ?Patient not taking: Reported on 04/08/2018 02/09/18   Armandina Stammer I, NP  ?ibuprofen (ADVIL) 200 MG tablet Take 2 tablets (400 mg total) by mouth 2 (two) times daily as needed for moderate pain. 06/09/20   Jeannie Fend, PA-C  ?methocarbamol (ROBAXIN) 500 MG tablet Take 1 tablet (500 mg total) by mouth 2 (two) times daily. 08/07/20   Tanda Rockers, PA-C  ?mirtazapine (REMERON) 30 MG tablet Take 1 tablet (30 mg total) by mouth at bedtime. For depression ?Patient not taking: Reported on 04/08/2018 02/09/18   Armandina Stammer I, NP  ?OLANZapine (ZYPREXA) 2.5 MG tablet Take 1 tablet (2.5 mg total) by mouth at bedtime. For mood control ?Patient not taking: Reported on 04/08/2018 02/09/18   Armandina Stammer I, NP  ?ondansetron (ZOFRAN) 4 MG tablet Take 1 tablet (4 mg total) by mouth every 6 (six) hours as needed for nausea or vomiting. 10/28/18   Terrilee Files, MD  ?pantoprazole (PROTONIX) 40 MG tablet Take 1 tablet (40 mg total) by mouth daily. For acid reflux 10/28/18   Terrilee Files, MD  ?promethazine (PHENERGAN) 25 MG tablet Take 1 tablet (25 mg  total) by mouth every 6 (six) hours as needed for nausea. ?Patient not taking: Reported on 07/14/2018 06/04/18   Benjiman CorePickering, Nathan, MD  ?   ? ?Allergies    ?Augmentin [amoxicillin-pot clavulanate] and Other   ? ?Review of Systems   ?Review of Systems  ?Constitutional:  Negative for chills and fever.  ?HENT:  Negative for congestion, ear pain and sore throat.   ?Respiratory:  Positive for cough, shortness of breath and wheezing.   ?Cardiovascular:  Negative for chest pain and leg swelling.  ?Gastrointestinal:  Negative for abdominal  pain and vomiting.  ?Neurological:  Negative for syncope.  ?All other systems reviewed and are negative. ? ?Physical Exam ?Updated Vital Signs ?BP (!) 148/85 (BP Location: Right Arm)   Pulse (!) 103   Temp 98.9 ?F (37.2 ?C) (Oral)   Resp 17   SpO2 96%  ?Physical Exam ?Vitals and nursing note reviewed.  ?Constitutional:   ?   General: He is not in acute distress. ?   Appearance: He is well-developed. He is not toxic-appearing.  ?HENT:  ?   Head: Normocephalic and atraumatic.  ?Eyes:  ?   General:     ?   Right eye: No discharge.     ?   Left eye: No discharge.  ?   Conjunctiva/sclera: Conjunctivae normal.  ?Cardiovascular:  ?   Rate and Rhythm: Regular rhythm. Tachycardia present.  ?Pulmonary:  ?   Breath sounds: Decreased air movement (Mild) present. Wheezing (Biphasic throughout.) present. No rales.  ?   Comments: Currently on albuterol nebulizer treatment ?Abdominal:  ?   General: There is no distension.  ?   Palpations: Abdomen is soft.  ?   Tenderness: There is no abdominal tenderness.  ?Musculoskeletal:  ?   Cervical back: Neck supple.  ?   Right lower leg: No edema.  ?   Left lower leg: No edema.  ?Skin: ?   General: Skin is warm and dry.  ?Neurological:  ?   Mental Status: He is alert.  ?   Comments: Clear speech.   ?Psychiatric:     ?   Behavior: Behavior normal.  ? ? ?ED Results / Procedures / Treatments   ?Labs ?(all labs ordered are listed, but only abnormal results are displayed) ?Labs Reviewed  ?RESP PANEL BY RT-PCR (FLU A&B, COVID) [id number]  ?COMPREHENSIVE METABOLIC PANEL  ?CBC WITH DIFFERENTIAL/PLATELET  ? ? ?EKG ?None ? ?Radiology ?DG Chest Portable 1 View ? ?Result Date: 08/27/2021 ?CLINICAL DATA:  Shortness of breath. EXAM: PORTABLE CHEST 1 VIEW COMPARISON:  Chest radiograph dated 03/19/2021. FINDINGS: The heart size and mediastinal contours are within normal limits. Both lungs are clear. The visualized skeletal structures are unremarkable. IMPRESSION: No active disease. Electronically Signed   By:  Elgie CollardArash  Radparvar M.D.   On: 08/27/2021 02:20   ? ?Procedures ?Marland Kitchen.Critical Care ?Performed by: Cherly AndersonPetrucelli, Navie Lamoreaux R, PA-C ?Authorized by: Cherly AndersonPetrucelli, Adriannah Steinkamp R, PA-C  ?  ?CRITICAL CARE ?Performed by: Harvie HeckSamantha Sherra Kimmons ? ? ?Total critical care time: 35 minutes ? ?Critical care time was exclusive of separately billable procedures and treating other patients. ? ?Critical care was necessary to treat or prevent imminent or life-threatening deterioration. ? ?Critical care was time spent personally by me on the following activities: development of treatment plan with patient and/or surrogate as well as nursing, discussions with consultants, evaluation of patient's response to treatment, examination of patient, obtaining history from patient or surrogate, ordering and performing treatments and interventions, ordering and review of laboratory studies, ordering  and review of radiographic studies, pulse oximetry and re-evaluation of patient's condition. ? ? ? ?Medications Ordered in ED ?Medications  ?albuterol (PROVENTIL) (2.5 MG/3ML) 0.083% nebulizer solution (10 mg/hr Nebulization Given 08/27/21 0127)  ?sodium chloride 0.9 % bolus 1,000 mL (1,000 mLs Intravenous New Bag/Given 08/27/21 0137)  ? ? ?ED Course/ Medical Decision Making/ A&P ?  ?                        ?Medical Decision Making ?Amount and/or Complexity of Data Reviewed ?Labs: ordered. ?Radiology: ordered. ? ?Risk ?Prescription drug management. ?Decision regarding hospitalization. ? ?Patient presents to the ED with complaints of dyspnea, this involves an extensive number of treatment options, and is a complaint that carries with it a high risk of complications and morbidity. Nontoxic, vitals w/ mild tachycardia, currently on neb tx..  ? ?DDX including but not limited to: asthma exacerbation, pneumonia, pneumothorax, critical anemia, new onset CHF, PE.  ? ?Additional history obtained:  ?Chart & nursing note reviewed.  ?Prior ED visits and admissions for asthma  exacerbations. ? ?EKG: no STEMI ? ?Lab Tests:  ?I viewed & interpreted labs including:  ?CBC: Unremarkable ?CMP: Hypokalemia. ?Covid/flu: Negative ? ?Imaging Studies:  ?I ordered and viewed the following imaging

## 2021-08-28 ENCOUNTER — Other Ambulatory Visit (HOSPITAL_COMMUNITY): Payer: Self-pay

## 2021-08-28 LAB — BASIC METABOLIC PANEL
Anion gap: 7 (ref 5–15)
BUN: 11 mg/dL (ref 6–20)
CO2: 25 mmol/L (ref 22–32)
Calcium: 9.3 mg/dL (ref 8.9–10.3)
Chloride: 108 mmol/L (ref 98–111)
Creatinine, Ser: 0.77 mg/dL (ref 0.61–1.24)
GFR, Estimated: >60 mL/min (ref >60)
Glucose, Bld: 143 mg/dL — ABNORMAL HIGH (ref 70–99)
Potassium: 4.4 mmol/L (ref 3.5–5.1)
Sodium: 140 mmol/L (ref 135–145)

## 2021-08-28 LAB — MAGNESIUM: Magnesium: 2.5 mg/dL — ABNORMAL HIGH (ref 1.7–2.4)

## 2021-08-28 MED ORDER — PREDNISONE 10 MG PO TABS
ORAL_TABLET | ORAL | 0 refills | Status: DC
  Filled 2021-08-28: qty 21, 6d supply, fill #0

## 2021-08-28 NOTE — Discharge Summary (Signed)
Physician Discharge Summary  ?Devin [name] DXA:[id number] DOB: Jun 20, 1980 DOA: 08/27/2021 ? ?PCP: Patient, No Pcp Per (Inactive) ? ?Admit date: 08/27/2021 ?Discharge date: 08/28/2021 ? ?Admitted From: Home ?Disposition:  Home ? ?Recommendations for Outpatient Follow-up:  ?Follow up with PCP in 1-2 weeks ?Please obtain BMP/CBC in one week ? ? ?Home Health:no ?Equipment/Devices:none ? ?Discharge Condition:Stable ?CODE STATUS:Full ?Diet recommendation: Heart Healthy  ? ?Brief/Interim Summary: ?41 y.o. male past medical history of asthma cannabis use and bipolar disorder comes in with shortness of breath brought in by EMS was found to be hypoxic with a heart rate of 110, was admitted for acute asthma exacerbation. ? ?Discharge Diagnoses:  ?Active Problems: ?  Substance induced mood disorder (HCC) ?  Bipolar II disorder (HCC) ?  Asthma exacerbation ?  Hypokalemia ?  Cannabis use disorder ? ?Acute asthma exacerbation: ?Chest x-ray showed no infiltrates there were no signs of infection. ?He started on IV fluids steroids he remained afebrile leukocytosis saturations were checked with ambulation and remained stable he will go home on a steroid taper. ? ?Hypokalemia: ?Replete orally now resolved likely due to albuterol. ? ?Substance-induced mood disorder: ?Counseling. ? ?Bipolar disorder: ?Continue Neurontin. ? ?Discharge Instructions ? ?Discharge Instructions   ? ? Diet - low sodium heart healthy   Complete by: As directed ?  ? Increase activity slowly   Complete by: As directed ?  ? ?  ? ?Allergies as of 08/28/2021   ? ?   Reactions  ? Augmentin [amoxicillin-pot Clavulanate] Hives  ? Tolerates Amoxicillin - Devin [name]  ? Other Nausea And Vomiting  ? coconut  ? ?  ? ?  ?Medication List  ?  ? ?TAKE these medications   ? ?albuterol 108 (90 Base) MCG/ACT inhaler ?Commonly known as: VENTOLIN HFA ?Inhale 1-2 puffs into the lungs every 6 (six) hours as needed for wheezing or shortness of breath. ?What changed: Another medication with the  same name was removed. Continue taking this medication, and follow the directions you see here. ?  ?celecoxib 200 MG capsule ?Commonly known as: CeleBREX ?Take 1 capsule (200 mg total) by mouth 2 (two) times daily. ?  ?gabapentin 400 MG capsule ?Commonly known as: NEURONTIN ?Take 1 capsule (400 mg total) by mouth 3 (three) times daily. For agitation ?  ?ibuprofen 200 MG tablet ?Commonly known as: ADVIL ?Take 2 tablets (400 mg total) by mouth 2 (two) times daily as needed for moderate pain. ?  ?pantoprazole 40 MG tablet ?Commonly known as: PROTONIX ?Take 1 tablet (40 mg total) by mouth daily. For acid reflux ?  ?predniSONE 10 MG tablet ?Commonly known as: DELTASONE ?Takes 6 tablets for 1 days, then 5 tablets for 1 days, then 4 tablets for 1 days, then 3 tablets for 1 days, then 2 tabs for 1 days, then 1 tab for 1 days, and then stop. ?  ? ?  ? ? ?Allergies  ?Allergen Reactions  ? Augmentin [Amoxicillin-Pot Clavulanate] Hives  ?  Tolerates Amoxicillin - Devin [name]  ? Other Nausea And Vomiting  ?  coconut  ? ? ?Consultations: ?None ? ? ?Procedures/Studies: ?DG Chest Portable 1 View ? ?Result Date: 08/27/2021 ?CLINICAL DATA:  Shortness of breath. EXAM: PORTABLE CHEST 1 VIEW COMPARISON:  Chest radiograph dated 03/19/2021. FINDINGS: The heart size and mediastinal contours are within normal limits. Both lungs are clear. The visualized skeletal structures are unremarkable. IMPRESSION: No active disease. Electronically Signed   By: Elgie Collard M.D.   On: 08/27/2021 02:20  ? ?DG Knee  Complete 4 Views Right ? ?Result Date: 08/09/2021 ?CLINICAL DATA:  RIGHT knee pain EXAM: RIGHT KNEE - COMPLETE 4+ VIEW COMPARISON:  None. FINDINGS: No fracture of the proximal tibia or distal femur. Patella is normal. No joint effusion. The patella appears high riding. IMPRESSION: 1. No fracture dislocation.  No joint effusion. 2. Patella appears high riding. Consider 30 degree lateral flexion view to exclude patella Oda KiltsAlta. Electronically  Signed   By: Genevive BiStewart  Edmunds M.D.   On: 08/09/2021 08:28  ? ?VAS US LOWER EXTREMITY VENOUS (DVT) (ONLY [facility] & [facility]) ? ?Result Date: 08/09/2021 ? Lower Venous DVT Study Patient Name:  Devin KillingsMARK [name]  Date of Exam:   08/09/2021 Medical Rec #: [id number]     Accession #:    [id number] Date of Birth: 01/19/1981      Patient Gender: M Patient Age:   [age] years [facility] Procedure:      VAS US LOWER EXTREMITY VENOUS (DVT) Referring Phys: ABIGAIL HARRIS --------------------------------------------------------------------------------  Indications: Swelling.  Risk Factors: None identified. Comparison Study: No prior studies. Performing Technologist: Chanda BusingGregory Collins RVT  Examination Guidelines: A complete evaluation includes B-mode imaging, spectral Doppler, color Doppler, and power Doppler as needed of all accessible portions of each vessel. Bilateral testing is considered an integral part of a complete examination. Limited examinations for reoccurring indications may be performed as noted. The reflux portion of the exam is performed with the patient in reverse Trendelenburg.  +---------+---------------+---------+-----------+----------+--------------+ RIGHT    CompressibilityPhasicitySpontaneityPropertiesThrombus Aging +---------+---------------+---------+-----------+----------+--------------+ CFV      Full           Yes      Yes                                 +---------+---------------+---------+-----------+----------+--------------+ SFJ      Full                                                        +---------+---------------+---------+-----------+----------+--------------+ FV Prox  Full                                                        +---------+---------------+---------+-----------+----------+--------------+ FV Mid   Full                                                        +---------+---------------+---------+-----------+----------+--------------+ FV  DistalFull                                                        +---------+---------------+---------+-----------+----------+--------------+ PFV      Full                                                        +---------+---------------+---------+-----------+----------+--------------+  POP      Full           Yes      Yes                                 +---------+---------------+---------+-----------+----------+--------------+ PTV      Full                                                        +---------+---------------+---------+-----------+----------+--------------+ PERO     Full                                                        +---------+---------------+---------+-----------+----------+--------------+   +----+---------------+---------+-----------+----------+--------------+ LEFTCompressibilityPhasicitySpontaneityPropertiesThrombus Aging +----+---------------+---------+-----------+----------+--------------+ CFV Full           Yes      Yes                                 +----+---------------+---------+-----------+----------+--------------+     Summary: RIGHT: - There is no evidence of deep vein thrombosis in the lower extremity.  - No cystic structure found in the popliteal fossa.  LEFT: - No evidence of common femoral vein obstruction.  *See table(s) above for measurements and observations. Electronically signed by Sherald Hess MD on 08/09/2021 at 3:10:18 PM.    Final    ?(Echo, Carotid, EGD, Colonoscopy, ERCP)  ? ? ?Subjective: ?No complaints ? ?Discharge Exam: ?Vitals:  ? 08/28/21 0420 08/28/21 0721  ?BP: 101/61   ?Pulse: (!) 56   ?Resp: 18   ?Temp: 97.6 ?F (36.4 ?C)   ?SpO2: 95% 96%  ? ?Vitals:  ? 08/27/21 2008 08/28/21 0021 08/28/21 0420 08/28/21 3295  ?BP: (!) 134/92 (!) 149/83 101/61   ?Pulse: (!) 101 77 (!) 56   ?Resp: 16 18 18    ?Temp: 98 ?F (36.7 ?C) 97.7 ?F (36.5 ?C) 97.6 ?F (36.4 ?C)   ?TempSrc: Oral Oral Oral   ?SpO2: 97% 97% 95% 96%   ? ? ?General: Pt is alert, awake, not in acute distress ?Cardiovascular: RRR, S1/S2 +, no rubs, no gallops ?Respiratory: CTA bilaterally, no wheezing, no rhonchi ?Abdominal: Soft, NT, ND, bowel sounds + ?Extremities: no edema, no cyanosis ? ? ? ?The results of significant diagnostics from this hospitaliza

## 2021-11-15 ENCOUNTER — Emergency Department (HOSPITAL_COMMUNITY)
Admission: EM | Admit: 2021-11-15 | Discharge: 2021-11-15 | Disposition: A | Discharge status: Home / Self Care | Payer: Self-pay | Attending: Emergency Medicine | Admitting: Emergency Medicine

## 2021-11-15 ENCOUNTER — Other Ambulatory Visit: Payer: Self-pay

## 2021-11-15 ENCOUNTER — Encounter (HOSPITAL_COMMUNITY): Payer: Self-pay | Admitting: Emergency Medicine

## 2021-11-15 ENCOUNTER — Emergency Department (HOSPITAL_COMMUNITY): Payer: Self-pay

## 2021-11-15 DIAGNOSIS — R000 Tachycardia, unspecified: Secondary | ICD-10-CM | POA: Insufficient documentation

## 2021-11-15 DIAGNOSIS — J449 Chronic obstructive pulmonary disease, unspecified: Secondary | ICD-10-CM | POA: Insufficient documentation

## 2021-11-15 DIAGNOSIS — Z7952 Long term (current) use of systemic steroids: Secondary | ICD-10-CM | POA: Insufficient documentation

## 2021-11-15 DIAGNOSIS — J4541 Moderate persistent asthma with (acute) exacerbation: Secondary | ICD-10-CM | POA: Insufficient documentation

## 2021-11-15 MED ORDER — PREDNISONE 20 MG PO TABS: 60.0000 mg | ORAL_TABLET | Freq: Every day | ORAL | 0 refills | Status: DC

## 2021-11-15 MED ORDER — ALBUTEROL SULFATE HFA 108 (90 BASE) MCG/ACT IN AERS
2.0000 | INHALATION_SPRAY | Freq: Once | RESPIRATORY_TRACT | Status: AC
  Administered 2021-11-15: 2 via RESPIRATORY_TRACT
  Filled 2021-11-15: qty 6.7

## 2021-11-15 NOTE — ED Triage Notes (Signed)
Pt BIB EMS from home, c/o shortness of breath. Chronic hx of asthma, stated he ran out of nebulizers and had trouble catching his breath, Received 10mg  of albuterol and 2-5mg  nebulizer, 125 mg solumedrol in EMS placed 22 gauge. Bilateral wheezing with tripod. 92% RA  BP 128/70 P 110 RR 42 spO2 100% after nebs

## 2021-11-15 NOTE — Discharge Instructions (Signed)
You were seen in the emergency department for worsening shortness of breath.  Your chest x-ray was unremarkable.  You received steroids and breathing treatments and an albuterol inhaler.  We are putting you on 4 more days of steroids.  Please continue albuterol inhaler 2 puffs every 4-6 hours as needed.  Follow-up with your regular doctor.  Return to the emergency department if any worsening or concerning symptoms

## 2021-11-15 NOTE — ED Provider Notes (Signed)
[facility]PT Provider Note   CSN: [id number] Arrival date & time: 11/15/21  1553     History  Chief Complaint  Patient presents with   Shortness of Breath    Devin [name] is a 41 y.o. male.  He has a history of asthma/COPD.  He was acutely short of breath today and felt like he could not get a deep breath.  Called 911 and was given Solu-Medrol and breathing treatments with some improvement in his symptoms.  He is not sure what the exacerbating factor was but he thinks it was the heat.  He is a non-smoker.  Having a dry cough nonproductive no fevers or chills nausea vomiting.  No leg swelling or leg pain.  The history is provided by the patient.  Shortness of Breath Severity:  Severe Onset quality:  Sudden Timing:  Constant Progression:  Improving Chronicity:  Recurrent Context comment:  Heat Relieved by:  Nothing Worsened by:  Activity and coughing Ineffective treatments:  Inhaler Associated symptoms: cough and wheezing   Associated symptoms: no abdominal pain, no chest pain, no fever, no hemoptysis and no sputum production   Risk factors: no tobacco use        Home Medications Prior to Admission medications   Medication Sig Start Date End Date Taking? Authorizing Provider  albuterol (VENTOLIN HFA) 108 (90 Base) MCG/ACT inhaler Inhale 1-2 puffs into the lungs every 6 (six) hours as needed for wheezing or shortness of breath. Patient not taking: Reported on 08/27/2021 03/19/21   Placido Sou, PA-C  celecoxib (CELEBREX) 200 MG capsule Take 1 capsule (200 mg total) by mouth 2 (two) times daily. Patient not taking: Reported on 08/27/2021 08/09/21   Arthor Captain, PA-C  gabapentin (NEURONTIN) 400 MG capsule Take 1 capsule (400 mg total) by mouth 3 (three) times daily. For agitation 02/09/18   Armandina Stammer I, NP  ibuprofen (ADVIL) 200 MG tablet Take 2 tablets (400 mg total) by mouth 2 (two) times daily as needed for moderate pain. 06/09/20    Jeannie Fend, PA-C  pantoprazole (PROTONIX) 40 MG tablet Take 1 tablet (40 mg total) by mouth daily. For acid reflux Patient not taking: Reported on 08/27/2021 10/28/18   Terrilee Files, MD  predniSONE (DELTASONE) 10 MG tablet Takes 6 tablets for 1 day then decrease by 1 tablet daily until finished (6-5-4-3-2-1) 08/28/21   Marinda Elk, MD      Allergies    Augmentin [amoxicillin-pot clavulanate] and Other    Review of Systems   Review of Systems  Constitutional:  Negative for fever.  Eyes:  Negative for visual disturbance.  Respiratory:  Positive for cough, shortness of breath and wheezing. Negative for hemoptysis and sputum production.   Cardiovascular:  Negative for chest pain.  Gastrointestinal:  Negative for abdominal pain.  Neurological:  Negative for syncope.    Physical Exam Updated Vital Signs Temp 98.7 F (37.1 C)  Physical Exam Vitals and nursing note reviewed.  Constitutional:      General: He is not in acute distress.    Appearance: He is well-developed.  HENT:     Head: Normocephalic and atraumatic.  Eyes:     Conjunctiva/sclera: Conjunctivae normal.  Cardiovascular:     Rate and Rhythm: Regular rhythm. Tachycardia present.     Heart sounds: No murmur heard. Pulmonary:     Effort: Accessory muscle usage present. No respiratory distress.     Breath sounds: Normal breath sounds.  Abdominal:  Palpations: Abdomen is soft.     Tenderness: There is no abdominal tenderness.  Musculoskeletal:        General: No swelling. Normal range of motion.     Cervical back: Neck supple.     Right lower leg: No tenderness. No edema.     Left lower leg: No tenderness. No edema.  Skin:    General: Skin is warm and dry.     Capillary Refill: Capillary refill takes less than 2 seconds.  Neurological:     General: No focal deficit present.     Mental Status: He is alert.     ED Results / Procedures / Treatments   Labs (all labs ordered are listed, but only  abnormal results are displayed) Labs Reviewed - No data to display  EKG EKG Interpretation  Date/Time:  Thursday November 15 2021 16:40:19 EDT Ventricular Rate:  96 PR Interval:  163 QRS Duration: 94 QT Interval:  371 QTC Calculation: 469 R Axis:   18 Text Interpretation: Sinus rhythm No significant change since prior 5/23 Confirmed by Meridee Score [phone]) on 11/15/2021 4:47:15 PM  Radiology DG Chest Port 1 View  Result Date: 11/15/2021 CLINICAL DATA:  Shortness of breath. EXAM: PORTABLE CHEST 1 VIEW COMPARISON:  None Available. FINDINGS: The heart size and mediastinal contours are within normal limits. Both lungs are clear. The visualized skeletal structures are unremarkable. IMPRESSION: No active disease. Electronically Signed   By: Larose Hires D.O.   On: 11/15/2021 16:29    Procedures Procedures    Medications Ordered in ED Medications - No data to display  ED Course/ Medical Decision Making/ A&P Clinical Course as of 11/16/21 1139  Thu Nov 15, 2021  1638 Chest x-ray interpreted by me as no acute infiltrates no pneumothorax.  Awaiting radiology reading. [MB]  4097 Patient had 2 puffs of albuterol has eaten and drank and he feels ready for discharge.  Return instructions discussed [MB]    Clinical Course User Index [MB] Terrilee Files, MD                           Medical Decision Making Amount and/or Complexity of Data Reviewed Radiology: ordered.  Risk Prescription drug management.  This patient complains of asthma exacerbation; this involves an extensive number of treatment Options and is a complaint that carries with it a high risk of complications and morbidity. The differential includes asthma, pneumonia, COPD, pneumothorax, PE  I ordered medication inhalational treatments and reviewed PMP when indicated. I ordered imaging studies which included chest x-ray and I independently    visualized and interpreted imaging which showed no acute findings Additional  history obtained from patient significant other and EMS Previous records obtained and reviewed in epic, prior visits for asthma Cardiac monitoring reviewed, sinus rhythm/sinus tachycardia Social determinants considered, patient with significant stressors including financial, food insecurity, social isolation Critical Interventions: None  After the interventions stated above, I reevaluated the patient and found patient to be symptomatically improved Admission and further testing considered, no indications for admission at this time.  We will have patient continue with inhaler, prescribe steroids, recommend outpatient follow-up.  Return instructions discussed          Final Clinical Impression(s) / ED Diagnoses Final diagnoses:  Moderate persistent asthma with exacerbation    Rx / DC Orders ED Discharge Orders          Ordered    predniSONE (DELTASONE) 20 MG tablet  Daily        11/15/21 1809              Terrilee Files, MD 11/16/21 1141

## 2022-02-13 ENCOUNTER — Emergency Department (HOSPITAL_COMMUNITY): Payer: Self-pay

## 2022-02-13 ENCOUNTER — Emergency Department (HOSPITAL_COMMUNITY)
Admission: EM | Admit: 2022-02-13 | Discharge: 2022-02-13 | Disposition: A | Discharge status: Home / Self Care | Payer: Self-pay | Attending: Emergency Medicine | Admitting: Emergency Medicine

## 2022-02-13 ENCOUNTER — Encounter (HOSPITAL_COMMUNITY): Payer: Self-pay

## 2022-02-13 ENCOUNTER — Other Ambulatory Visit: Payer: Self-pay

## 2022-02-13 DIAGNOSIS — Z7951 Long term (current) use of inhaled steroids: Secondary | ICD-10-CM | POA: Insufficient documentation

## 2022-02-13 DIAGNOSIS — J45901 Unspecified asthma with (acute) exacerbation: Secondary | ICD-10-CM | POA: Insufficient documentation

## 2022-02-13 DIAGNOSIS — J449 Chronic obstructive pulmonary disease, unspecified: Secondary | ICD-10-CM | POA: Insufficient documentation

## 2022-02-13 MED ORDER — ALBUTEROL SULFATE HFA 108 (90 BASE) MCG/ACT IN AERS
2.0000 | INHALATION_SPRAY | RESPIRATORY_TRACT | Status: DC | PRN
  Administered 2022-02-13: 2 via RESPIRATORY_TRACT
  Filled 2022-02-13: qty 6.7

## 2022-02-13 MED ORDER — PREDNISONE 10 MG PO TABS: 60.0000 mg | ORAL_TABLET | Freq: Every day | ORAL | 0 refills | Status: DC

## 2022-02-13 NOTE — ED Provider Notes (Signed)
[facility]-EMERGENCY DEPT Provider Note   CSN: [id number] Arrival date & time: 02/13/22  2248     History  Chief Complaint  Patient presents with   Shortness of Breath    Devin [name] is a 41 y.o. male.   Shortness of Breath   41 year old male presents emergency department with complaints of shortness of breath.  Patient states a history of asthma but has been without his medication for the past several weeks due to financial troubles.  Patient states he received albuterol as well as IV Solu-Medrol via EMS and feels back to baseline upon initial presentation.  Denies associated chest pain, fever, chills, cough, congestion, abdominal pain, nausea, vomiting.  Past medical history significant for asthma, GERD, bipolar 1, ADHD, COPD, cannabis use disorder, tobacco use disorder.  Home Medications Prior to Admission medications   Medication Sig Start Date End Date Taking? Authorizing Provider  predniSONE (DELTASONE) 10 MG tablet Take 6 tablets (60 mg total) by mouth daily. 02/13/22  Yes Sherian Maroon A, PA  albuterol (VENTOLIN HFA) 108 (90 Base) MCG/ACT inhaler Inhale 1-2 puffs into the lungs every 6 (six) hours as needed for wheezing or shortness of breath. 03/19/21   Placido Sou, PA-C  celecoxib (CELEBREX) 200 MG capsule Take 1 capsule (200 mg total) by mouth 2 (two) times daily. Patient not taking: Reported on 08/27/2021 08/09/21   Arthor Captain, PA-C  gabapentin (NEURONTIN) 400 MG capsule Take 1 capsule (400 mg total) by mouth 3 (three) times daily. For agitation Patient not taking: Reported on 11/15/2021 02/09/18   Armandina Stammer I, NP  ibuprofen (ADVIL) 200 MG tablet Take 2 tablets (400 mg total) by mouth 2 (two) times daily as needed for moderate pain. Patient not taking: Reported on 11/15/2021 06/09/20   Army Melia A, PA-C  pantoprazole (PROTONIX) 40 MG tablet Take 1 tablet (40 mg total) by mouth daily. For acid reflux Patient not taking: Reported on  08/27/2021 10/28/18   Terrilee Files, MD      Allergies    Augmentin [amoxicillin-pot clavulanate] and Other    Review of Systems   Review of Systems  Respiratory:  Positive for shortness of breath.   All other systems reviewed and are negative.   Physical Exam Updated Vital Signs BP (!) 139/112   Pulse 85   Temp 97.9 F (36.6 C) (Oral)   Resp 18   Ht 5\' 10"  (1.778 m)   Wt 83.9 kg   SpO2 95%   BMI 26.54 kg/m  Physical Exam Vitals and nursing note reviewed.  Constitutional:      General: He is not in acute distress.    Appearance: He is well-developed.  HENT:     Head: Normocephalic and atraumatic.  Eyes:     Conjunctiva/sclera: Conjunctivae normal.  Cardiovascular:     Rate and Rhythm: Normal rate and regular rhythm.     Heart sounds: No murmur heard. Pulmonary:     Effort: Pulmonary effort is normal. No respiratory distress.     Breath sounds: Normal breath sounds. No wheezing, rhonchi or rales.  Abdominal:     Palpations: Abdomen is soft.     Tenderness: There is no abdominal tenderness.  Musculoskeletal:        General: No swelling.     Cervical back: Neck supple.     Right lower leg: No edema.     Left lower leg: No edema.  Skin:    General: Skin is warm and dry.  Capillary Refill: Capillary refill takes less than 2 seconds.  Neurological:     Mental Status: He is alert.  Psychiatric:        Mood and Affect: Mood normal.     ED Results / Procedures / Treatments   Labs (all labs ordered are listed, but only abnormal results are displayed) Labs Reviewed - No data to display  EKG None  Radiology DG Chest 2 View  Result Date: 02/13/2022 CLINICAL DATA:  Shortness of breath EXAM: CHEST - 2 VIEW COMPARISON:  Chest x-ray dated November 15, 2021 FINDINGS: The heart size and mediastinal contours are within normal limits. Both lungs are clear. The visualized skeletal structures are unremarkable. IMPRESSION: No active cardiopulmonary disease. Electronically  Signed   By: Allegra Lai M.D.   On: 02/13/2022 11:38    Procedures Procedures    Medications Ordered in ED Medications  albuterol (VENTOLIN HFA) 108 (90 Base) MCG/ACT inhaler 2 puff (2 puffs Inhalation Given 02/13/22 1349)    ED Course/ Medical Decision Making/ A&P                           Medical Decision Making Amount and/or Complexity of Data Reviewed Radiology: ordered.  Risk Prescription drug management.   This patient presents to the ED for concern of shortness of breath, this involves an extensive number of treatment options, and is a complaint that carries with it a high risk of complications and morbidity.  The differential diagnosis includes The causes for shortness of breath include but are not limited to Cardiac (AHF, pericardial effusion and tamponade, arrhythmias, ischemia, etc) Respiratory (COPD, asthma, pneumonia, pneumothorax, primary pulmonary hypertension, PE/VQ mismatch) Hematological (anemia)  Co morbidities that complicate the patient evaluation  See HPI   Additional history obtained:  Additional history obtained from EMR External records from outside source obtained and reviewed including hospital records   Lab Tests:  N/a   Imaging Studies ordered:  I ordered imaging studies including chest x-ray I independently visualized and interpreted imaging which showed no acute cardiopulmonary abnormality.  Sinus rhythm I agree with the radiologist interpretation   Cardiac Monitoring: / EKG:  The patient was maintained on a cardiac monitor.  I personally viewed and interpreted the cardiac monitored which showed an underlying rhythm of: Sinus rhythm with nonspecific T wave abnormalities.   Consultations Obtained:  N/a   Problem List / ED Course / Critical interventions / Medication management  Asthma exacerbation I ordered medication including albuterol inhaler   Reevaluation of the patient after these medicines showed that the  patient improved I have reviewed the patients home medicines and have made adjustments as needed   Social Determinants of Health:  Chronic cigarette use.  Chronic marijuana use.   Test / Admission - Considered:  Asthma exacerbation Vitals signs significant for mildly hypertensive with a blood pressure 139/112.  Recommend close follow-up with PCP regarding elevation of blood pressure.. Otherwise within normal range and stable throughout visit. Imaging studies significant for: See above Patient's symptoms likely secondary to asthma exacerbation.  Patient seems back to baseline upon initial presentation after receiving medications via EMS.  He was given albuterol inhaler while in the emergency department.  Continued outpatient oral steroid regimen recommended.  Treatment plan discussed at length with patient and he is understanding was agreeable to said plan. Worrisome signs and symptoms were discussed with the patient, and the patient acknowledged understanding to return to the ED if noticed. Patient was stable  upon discharge.          Final Clinical Impression(s) / ED Diagnoses Final diagnoses:  Exacerbation of asthma, unspecified asthma severity, unspecified whether persistent    Rx / DC Orders ED Discharge Orders          Ordered    predniSONE (DELTASONE) 10 MG tablet  Daily        02/13/22 1623              Wilnette Kales, [place] 02/13/22 1623    Regan Lemming, MD 02/13/22 1900

## 2022-02-13 NOTE — ED Triage Notes (Signed)
Pt to er via ems, per ems pt is here for an asthma exacerbation.  Pt states that he has had a cough for the past week, states that his asthma was worse this am.  EMS states that they gave 0.5 atrovent, 5 albuterol and 125 solumedrol.

## 2022-02-19 ENCOUNTER — Emergency Department (HOSPITAL_COMMUNITY)
Admission: EM | Admit: 2022-02-19 | Discharge: 2022-02-20 | Disposition: A | Discharge status: Home / Self Care | Payer: Self-pay | Attending: Emergency Medicine | Admitting: Emergency Medicine

## 2022-02-19 ENCOUNTER — Other Ambulatory Visit: Payer: Self-pay

## 2022-02-19 ENCOUNTER — Encounter (HOSPITAL_COMMUNITY): Payer: Self-pay

## 2022-02-19 DIAGNOSIS — K222 Esophageal obstruction: Secondary | ICD-10-CM | POA: Insufficient documentation

## 2022-02-19 DIAGNOSIS — F17200 Nicotine dependence, unspecified, uncomplicated: Secondary | ICD-10-CM | POA: Insufficient documentation

## 2022-02-19 DIAGNOSIS — J45909 Unspecified asthma, uncomplicated: Secondary | ICD-10-CM | POA: Insufficient documentation

## 2022-02-19 DIAGNOSIS — D72829 Elevated white blood cell count, unspecified: Secondary | ICD-10-CM | POA: Insufficient documentation

## 2022-02-19 DIAGNOSIS — Z7951 Long term (current) use of inhaled steroids: Secondary | ICD-10-CM | POA: Insufficient documentation

## 2022-02-19 DIAGNOSIS — Z7952 Long term (current) use of systemic steroids: Secondary | ICD-10-CM | POA: Insufficient documentation

## 2022-02-19 DIAGNOSIS — W44F3XA Food entering into or through a natural orifice, initial encounter: Secondary | ICD-10-CM

## 2022-02-19 MED ORDER — GLUCAGON HCL RDNA (DIAGNOSTIC) 1 MG IJ SOLR
1.0000 mg | Freq: Once | INTRAMUSCULAR | Status: AC
  Administered 2022-02-20: 1 mg via SUBCUTANEOUS
  Filled 2022-02-19: qty 1

## 2022-02-19 NOTE — ED Provider Notes (Signed)
[facility] DEPT Provider Note   CSN: [id number] Arrival date & time: 02/19/22  2307     History {Add pertinent medical, surgical, social history, OB history to HPI:1} Chief Complaint  Patient presents with   food bolus    Devin [name] is a 41 y.o. male.  The history is provided by the patient.  Devin [name] is a 41 y.o. male who presents to the Emergency Department complaining of *** Chicken 645, chicken tenderloin.       Home Medications Prior to Admission medications   Medication Sig Start Date End Date Taking? Authorizing Provider  albuterol (VENTOLIN HFA) 108 (90 Base) MCG/ACT inhaler Inhale 1-2 puffs into the lungs every 6 (six) hours as needed for wheezing or shortness of breath. 03/19/21   Rayna Sexton, PA-C  celecoxib (CELEBREX) 200 MG capsule Take 1 capsule (200 mg total) by mouth 2 (two) times daily. Patient not taking: Reported on 08/27/2021 08/09/21   Margarita Mail, PA-C  gabapentin (NEURONTIN) 400 MG capsule Take 1 capsule (400 mg total) by mouth 3 (three) times daily. For agitation Patient not taking: Reported on 11/15/2021 02/09/18   Lindell Spar I, NP  ibuprofen (ADVIL) 200 MG tablet Take 2 tablets (400 mg total) by mouth 2 (two) times daily as needed for moderate pain. Patient not taking: Reported on 11/15/2021 06/09/20   Suella Broad A, PA-C  pantoprazole (PROTONIX) 40 MG tablet Take 1 tablet (40 mg total) by mouth daily. For acid reflux Patient not taking: Reported on 08/27/2021 10/28/18   Hayden Rasmussen, MD  predniSONE (DELTASONE) 10 MG tablet Take 6 tablets (60 mg total) by mouth daily. 02/13/22   Wilnette Kales, PA      Allergies    Augmentin [amoxicillin-pot clavulanate] and Other    Review of Systems   Review of Systems  All other systems reviewed and are negative.   Physical Exam Updated Vital Signs BP (!) 153/117 (BP Location: Left Arm)   Pulse (!) 102   Temp 98.4 F (36.9 C) (Oral)   Resp (!) 22   SpO2  98%  Physical Exam Vitals and nursing note reviewed.  Constitutional:      Appearance: He is well-developed.     Comments: Appears uncomfortable  HENT:     Head: Normocephalic and atraumatic.     Comments: Poor dentition Cardiovascular:     Rate and Rhythm: Normal rate and regular rhythm.     Heart sounds: No murmur heard. Pulmonary:     Effort: Pulmonary effort is normal. No respiratory distress.     Breath sounds: Normal breath sounds.  Abdominal:     Palpations: Abdomen is soft.     Tenderness: There is no abdominal tenderness. There is no guarding or rebound.  Musculoskeletal:        General: No tenderness.  Skin:    General: Skin is warm and dry.  Neurological:     Mental Status: He is alert and oriented to person, place, and time.  Psychiatric:        Behavior: Behavior normal.     ED Results / Procedures / Treatments   Labs (all labs ordered are listed, but only abnormal results are displayed) Labs Reviewed - No data to display  EKG None  Radiology No results found.  Procedures Procedures  {Document cardiac monitor, telemetry assessment procedure when appropriate:1}  Medications Ordered in ED Medications - No data to display  ED Course/ Medical Decision Making/ A&P  Medical Decision Making  ***  {Document critical care time when appropriate:1} {Document review of labs and clinical decision tools ie heart score, Chads2Vasc2 etc:1}  {Document your independent review of radiology images, and any outside records:1} {Document your discussion with family members, caretakers, and with consultants:1} {Document social determinants of health affecting pt's care:1} {Document your decision making why or why not admission, treatments were needed:1} Final Clinical Impression(s) / ED Diagnoses Final diagnoses:  None    Rx / DC Orders ED Discharge Orders     None

## 2022-02-19 NOTE — ED Triage Notes (Signed)
Pt has a piece of chicken stuck in his throat since 1845. Pt states that he usually drinks milk and gets it up but today is different.

## 2022-02-20 ENCOUNTER — Emergency Department (HOSPITAL_COMMUNITY): Payer: Self-pay

## 2022-02-20 LAB — CBC WITH DIFFERENTIAL/PLATELET
Abs Immature Granulocytes: 0.24 10*3/uL — ABNORMAL HIGH (ref 0.00–0.07)
Basophils Absolute: 0 10*3/uL (ref 0.0–0.1)
Basophils Relative: 0 %
Eosinophils Absolute: 0.4 10*3/uL (ref 0.0–0.5)
Eosinophils Relative: 3 %
HCT: 44.3 % (ref 39.0–52.0)
Hemoglobin: 15.2 g/dL (ref 13.0–17.0)
Immature Granulocytes: 2 %
Lymphocytes Relative: 18 %
Lymphs Abs: 2.7 10*3/uL (ref 0.7–4.0)
MCH: 32.5 pg (ref 26.0–34.0)
MCHC: 34.3 g/dL (ref 30.0–36.0)
MCV: 94.9 fL (ref 80.0–100.0)
Monocytes Absolute: 1.3 10*3/uL — ABNORMAL HIGH (ref 0.1–1.0)
Monocytes Relative: 8 %
Neutro Abs: 10.6 10*3/uL — ABNORMAL HIGH (ref 1.7–7.7)
Neutrophils Relative %: 69 %
Platelets: 339 10*3/uL (ref 150–400)
RBC: 4.67 MIL/uL (ref 4.22–5.81)
RDW: 12.8 % (ref 11.5–15.5)
WBC: 15.2 10*3/uL — ABNORMAL HIGH (ref 4.0–10.5)
nRBC: 0 % (ref 0.0–0.2)

## 2022-02-20 LAB — BASIC METABOLIC PANEL
Anion gap: 9 (ref 5–15)
BUN: 12 mg/dL (ref 6–20)
CO2: 20 mmol/L — ABNORMAL LOW (ref 22–32)
Calcium: 8.9 mg/dL (ref 8.9–10.3)
Chloride: 111 mmol/L (ref 98–111)
Creatinine, Ser: 0.98 mg/dL (ref 0.61–1.24)
GFR, Estimated: >60 mL/min (ref >60)
Glucose, Bld: 90 mg/dL (ref 70–99)
Potassium: 3.7 mmol/L (ref 3.5–5.1)
Sodium: 140 mmol/L (ref 135–145)

## 2022-02-20 MED ORDER — ALBUTEROL SULFATE (2.5 MG/3ML) 0.083% IN NEBU
2.5000 mg | INHALATION_SOLUTION | Freq: Once | RESPIRATORY_TRACT | Status: AC
  Administered 2022-02-20: 2.5 mg via RESPIRATORY_TRACT
  Filled 2022-02-20: qty 3

## 2022-02-20 MED ORDER — FENTANYL CITRATE PF 50 MCG/ML IJ SOSY
50.0000 ug | PREFILLED_SYRINGE | Freq: Once | INTRAMUSCULAR | Status: AC
  Administered 2022-02-20: 50 ug via INTRAVENOUS
  Filled 2022-02-20: qty 1

## 2022-02-20 MED ORDER — PANTOPRAZOLE SODIUM 40 MG PO TBEC: 40.0000 mg | DELAYED_RELEASE_TABLET | Freq: Every day | ORAL | 0 refills | Status: DC

## 2022-02-20 NOTE — ED Notes (Signed)
Pt coughed up chicken. Nurse able to visualize it in the emesis bag. Pt then able to swallow water without issue.

## 2022-03-06 ENCOUNTER — Encounter (HOSPITAL_COMMUNITY): Payer: Self-pay | Admitting: Radiology

## 2022-03-06 ENCOUNTER — Emergency Department (HOSPITAL_COMMUNITY): Payer: Self-pay

## 2022-03-06 ENCOUNTER — Emergency Department (HOSPITAL_COMMUNITY)
Admission: EM | Admit: 2022-03-06 | Discharge: 2022-03-06 | Disposition: A | Discharge status: Home / Self Care | Payer: Self-pay | Attending: Emergency Medicine | Admitting: Emergency Medicine

## 2022-03-06 ENCOUNTER — Other Ambulatory Visit: Payer: Self-pay

## 2022-03-06 DIAGNOSIS — D72829 Elevated white blood cell count, unspecified: Secondary | ICD-10-CM | POA: Insufficient documentation

## 2022-03-06 DIAGNOSIS — K209 Esophagitis, unspecified without bleeding: Secondary | ICD-10-CM | POA: Insufficient documentation

## 2022-03-06 DIAGNOSIS — R1013 Epigastric pain: Secondary | ICD-10-CM

## 2022-03-06 DIAGNOSIS — E860 Dehydration: Secondary | ICD-10-CM | POA: Insufficient documentation

## 2022-03-06 LAB — CBC WITH DIFFERENTIAL/PLATELET
Abs Immature Granulocytes: 0.04 10*3/uL (ref 0.00–0.07)
Basophils Absolute: 0 10*3/uL (ref 0.0–0.1)
Basophils Relative: 0 %
Eosinophils Absolute: 0.3 10*3/uL (ref 0.0–0.5)
Eosinophils Relative: 3 %
HCT: 49.7 % (ref 39.0–52.0)
Hemoglobin: 17.2 g/dL — ABNORMAL HIGH (ref 13.0–17.0)
Immature Granulocytes: 0 %
Lymphocytes Relative: 24 %
Lymphs Abs: 2.9 10*3/uL (ref 0.7–4.0)
MCH: 32.5 pg (ref 26.0–34.0)
MCHC: 34.6 g/dL (ref 30.0–36.0)
MCV: 93.8 fL (ref 80.0–100.0)
Monocytes Absolute: 1.1 10*3/uL — ABNORMAL HIGH (ref 0.1–1.0)
Monocytes Relative: 9 %
Neutro Abs: 7.7 10*3/uL (ref 1.7–7.7)
Neutrophils Relative %: 64 %
Platelets: 400 10*3/uL (ref 150–400)
RBC: 5.3 MIL/uL (ref 4.22–5.81)
RDW: 12.7 % (ref 11.5–15.5)
WBC: 12.1 10*3/uL — ABNORMAL HIGH (ref 4.0–10.5)
nRBC: 0 % (ref 0.0–0.2)

## 2022-03-06 LAB — LIPASE, BLOOD: Lipase: 30 U/L (ref 11–51)

## 2022-03-06 LAB — COMPREHENSIVE METABOLIC PANEL
ALT: 23 U/L (ref 0–44)
AST: 22 U/L (ref 15–41)
Albumin: 4.1 g/dL (ref 3.5–5.0)
Alkaline Phosphatase: 76 U/L (ref 38–126)
Anion gap: 9 (ref 5–15)
BUN: 12 mg/dL (ref 6–20)
CO2: 22 mmol/L (ref 22–32)
Calcium: 9.3 mg/dL (ref 8.9–10.3)
Chloride: 105 mmol/L (ref 98–111)
Creatinine, Ser: 0.91 mg/dL (ref 0.61–1.24)
GFR, Estimated: >60 mL/min (ref >60)
Glucose, Bld: 97 mg/dL (ref 70–99)
Potassium: 3.8 mmol/L (ref 3.5–5.1)
Sodium: 136 mmol/L (ref 135–145)
Total Bilirubin: 1.1 mg/dL (ref 0.3–1.2)
Total Protein: 8.4 g/dL — ABNORMAL HIGH (ref 6.5–8.1)

## 2022-03-06 MED ORDER — ALUM & MAG HYDROXIDE-SIMETH 200-200-20 MG/5ML PO SUSP
30.0000 mL | Freq: Once | ORAL | Status: AC
  Administered 2022-03-06: 30 mL via ORAL
  Filled 2022-03-06: qty 30

## 2022-03-06 MED ORDER — SODIUM CHLORIDE (PF) 0.9 % IJ SOLN
INTRAMUSCULAR | Status: AC
  Administered 2022-03-06: 10 mL
  Filled 2022-03-06: qty 50

## 2022-03-06 MED ORDER — PANTOPRAZOLE SODIUM 40 MG IV SOLR
40.0000 mg | Freq: Once | INTRAVENOUS | Status: AC
  Administered 2022-03-06: 40 mg via INTRAVENOUS
  Filled 2022-03-06: qty 10

## 2022-03-06 MED ORDER — OXYCODONE-ACETAMINOPHEN 5-325 MG PO TABS
1.0000 | ORAL_TABLET | Freq: Once | ORAL | Status: AC
  Administered 2022-03-06: 1 via ORAL
  Filled 2022-03-06: qty 1

## 2022-03-06 MED ORDER — PANTOPRAZOLE SODIUM 40 MG PO TBEC: 40.0000 mg | DELAYED_RELEASE_TABLET | Freq: Every day | ORAL | 0 refills | Status: DC

## 2022-03-06 MED ORDER — ONDANSETRON 4 MG PO TBDP: 4.0000 mg | ORAL_TABLET | Freq: Three times a day (TID) | ORAL | 0 refills | Status: DC | PRN

## 2022-03-06 MED ORDER — IOHEXOL 300 MG/ML  SOLN
100.0000 mL | Freq: Once | INTRAMUSCULAR | Status: AC | PRN
  Administered 2022-03-06: 100 mL via INTRAVENOUS

## 2022-03-06 MED ORDER — SODIUM CHLORIDE 0.9 % IV BOLUS
1000.0000 mL | Freq: Once | INTRAVENOUS | Status: AC
  Administered 2022-03-06: 1000 mL via INTRAVENOUS

## 2022-03-06 MED ORDER — MORPHINE SULFATE (PF) 4 MG/ML IV SOLN
4.0000 mg | Freq: Once | INTRAVENOUS | Status: AC
  Administered 2022-03-06: 4 mg via INTRAVENOUS
  Filled 2022-03-06: qty 1

## 2022-03-06 MED ORDER — ONDANSETRON 8 MG PO TBDP
8.0000 mg | ORAL_TABLET | Freq: Once | ORAL | Status: AC
  Administered 2022-03-06: 8 mg via ORAL
  Filled 2022-03-06: qty 1

## 2022-03-06 NOTE — ED Provider Triage Note (Signed)
Emergency Medicine Provider Triage Evaluation Note  Devin [name] , a 41 y.o. male  was evaluated in triage.  Pt complains of generalized abdominal pain, nausea, and vomiting for three days, but must severe in center of abdomen. He recently had multiple goody powders. Never had these symptoms before. Most recent marijuana use was three weeks ago. Drinks occasional alcohol.    Review of Systems  Positive:  Negative:   Physical Exam  BP (!) 119/99 (BP Location: Left Arm)   Pulse (!) 125   Temp 98.2 F (36.8 C) (Oral)   Resp 20   SpO2 98%  Gen:   Awake, no distress   Resp:  Normal effort  MSK:   Moves extremities without difficulty  Other:  + abdominal tenderness generalized.   Medical Decision Making  Medically screening exam initiated at 8:58 AM.  Appropriate orders placed.  Devin [name] was informed that the remainder of the evaluation will be completed by another provider, this initial triage assessment does not replace that evaluation, and the importance of remaining in the ED until their evaluation is complete.  He is tachycardic. Reviewed past records and no hx of similar presentations. Will order CT a/p to r/o serious pathology.   Devin Leach, PA-C 03/06/22 0900

## 2022-03-06 NOTE — Discharge Instructions (Addendum)
Please refrain from alcohol use and ibuprofen or Aleve or meloxicam use.  All of these can irritate your stomach.  Please continue taking the Protonix as prescribed.  Please follow-up with a gastroenterologist.  Make sure you are hydrating well, I recommend relatively bland foods for the next 2 or 3 days.  Use Zofran as needed for nausea.-New prescription for this.  Your CT scan did show some evidence of esophagitis which is irritation of the part of the gastrointestinal system that connects your mouth to your stomach. The protonix will help with this.

## 2022-03-06 NOTE — ED Provider Notes (Signed)
[facility]-EMERGENCY DEPT Provider Note   CSN: [id number] Arrival date & time: 03/06/22  0841     History  Chief Complaint  Patient presents with   Abdominal Pain    Devin [name] is a 41 y.o. male.   Abdominal Pain Patient is a 41 year old male with past medical history significant for   He is present emergency room today with complaints of generalized abdominal pain but indicates epigastrium when showing me when he is primarily having pain.  He states his symptoms of been ongoing for 3 days.  He states that he is feeling nauseous has had several episodes of nonbloody nonbilious emesis.  He states that he uses Goody's powder frequently.  He does use marijuana but not frequently.  Drinks alcohol occasionally but not heavily. Denies any chest pain or difficulty breathing.  He states he has had normal bowel movements with no BRBPR or pain.  He is not suffering from constipation or diarrhea.  Can tolerate water and bland foods.  Denies any fever or urinary symptoms such as dysuria frequency urgency or hematuria.   Patient has had some issues with food impactions in the past.  He has never seen a gastroenterologist or had a dilation.    Home Medications Prior to Admission medications   Medication Sig Start Date End Date Taking? Authorizing Provider  ondansetron (ZOFRAN-ODT) 4 MG disintegrating tablet Take 1 tablet (4 mg total) by mouth every 8 (eight) hours as needed for nausea or vomiting. 03/06/22  Yes Marya Lowden S, PA  albuterol (VENTOLIN HFA) 108 (90 Base) MCG/ACT inhaler Inhale 1-2 puffs into the lungs every 6 (six) hours as needed for wheezing or shortness of breath. 03/19/21   Placido Sou, PA-C  celecoxib (CELEBREX) 200 MG capsule Take 1 capsule (200 mg total) by mouth 2 (two) times daily. Patient not taking: Reported on 08/27/2021 08/09/21   Arthor Captain, PA-C  gabapentin (NEURONTIN) 400 MG capsule Take 1 capsule (400 mg total) by mouth 3  (three) times daily. For agitation Patient not taking: Reported on 11/15/2021 02/09/18   Armandina Stammer I, NP  ibuprofen (ADVIL) 200 MG tablet Take 2 tablets (400 mg total) by mouth 2 (two) times daily as needed for moderate pain. Patient not taking: Reported on 11/15/2021 06/09/20   Army Melia A, PA-C  pantoprazole (PROTONIX) 40 MG tablet Take 1 tablet (40 mg total) by mouth daily. 02/20/22   Tilden Fossa, MD  predniSONE (DELTASONE) 10 MG tablet Take 6 tablets (60 mg total) by mouth daily. 02/13/22   Peter Garter, PA      Allergies    Augmentin [amoxicillin-pot clavulanate] and Other    Review of Systems   Review of Systems  Gastrointestinal:  Positive for abdominal pain.    Physical Exam Updated Vital Signs BP 118/75   Pulse 80   Temp 98.6 F (37 C) (Oral)   Resp 18   SpO2 98%  Physical Exam Vitals and nursing note reviewed.  Constitutional:      General: He is not in acute distress. HENT:     Head: Normocephalic and atraumatic.     Nose: Nose normal.  Eyes:     General: No scleral icterus. Cardiovascular:     Rate and Rhythm: Normal rate and regular rhythm.     Pulses: Normal pulses.     Heart sounds: Normal heart sounds.  Pulmonary:     Effort: Pulmonary effort is normal. No respiratory distress.     Breath sounds: No wheezing.  Abdominal:     Palpations: Abdomen is soft.     Tenderness: There is abdominal tenderness in the epigastric area.  Musculoskeletal:     Cervical back: Normal range of motion.     Right lower leg: No edema.     Left lower leg: No edema.  Skin:    General: Skin is warm and dry.     Capillary Refill: Capillary refill takes less than 2 seconds.  Neurological:     Mental Status: He is alert. Mental status is at baseline.  Psychiatric:        Mood and Affect: Mood normal.        Behavior: Behavior normal.     ED Results / Procedures / Treatments   Labs (all labs ordered are listed, but only abnormal results are displayed) Labs  Reviewed  CBC WITH DIFFERENTIAL/PLATELET - Abnormal; Notable for the following components:      Result Value   WBC 12.1 (*)    Hemoglobin 17.2 (*)    Monocytes Absolute 1.1 (*)    All other components within normal limits  COMPREHENSIVE METABOLIC PANEL - Abnormal; Notable for the following components:   Total Protein 8.4 (*)    All other components within normal limits  LIPASE, BLOOD  URINALYSIS, ROUTINE W REFLEX MICROSCOPIC    EKG None  Radiology CT Abdomen Pelvis W Contrast  Result Date: 03/06/2022 CLINICAL DATA:  Acute abdominal pain with 2 days of nausea and vomiting. EXAM: CT ABDOMEN AND PELVIS WITH CONTRAST TECHNIQUE: Multidetector CT imaging of the abdomen and pelvis was performed using the standard protocol following bolus administration of intravenous contrast. RADIATION DOSE REDUCTION: This exam was performed according to the departmental dose-optimization program which includes automated exposure control, adjustment of the mA and/or kV according to patient size and/or use of iterative reconstruction technique. CONTRAST:  OMNIPAQUE IOHEXOL 300 MG/ML  SOLN COMPARISON:  CT October 28, 2018. FINDINGS: Lower chest: Distal esophageal wall thickening with prominent paraesophageal lymph nodes measuring up to 7 mm in short axis on image 5/2. Stable tiny pulmonary nodules measure up to 2 mm in the right lung base on image 15/4. Hepatobiliary: Hypodense 6 mm lesion in the right lobe of the liver on image 15/2 is technically too small to accurately characterize but stable dating back to October 28, 2018 compatible with a benign finding such as a cyst or hemangioma. Gallbladder is unremarkable. No biliary ductal dilation. Pancreas: No pancreatic ductal dilation or evidence of acute inflammation. Spleen: No splenomegaly. Adrenals/Urinary Tract: Bilateral adrenal glands appear normal. No hydronephrosis. Kidneys demonstrate symmetric enhancement. Urinary bladder is unremarkable for degree of  distension. Stomach/Bowel: Stomach is minimally distended limiting evaluation. No pathologic dilation of small or large bowel. The appendix and terminal ileum appear normal. Scattered colonic diverticulosis without findings of acute diverticulitis. Vascular/Lymphatic: Normal caliber abdominal aorta. No pathologically enlarged abdominal or pelvic lymph nodes. Reproductive: Prostate is unremarkable. Other: No significant abdominopelvic free fluid. Musculoskeletal: No acute osseous abnormality. IMPRESSION: 1. No acute abnormality in the abdomen or pelvis. 2. Scattered colonic diverticulosis without findings of acute diverticulitis. 3. Distal esophageal wall thickening with prominent paraesophageal lymph nodes nonspecific and possibly reflecting esophagitis with adjacent reactive lymph nodes. However, suggest further evaluation with endoscopy to assess for underlying lesion. Electronically Signed   By: Maudry Mayhew M.D.   On: 03/06/2022 12:47    Procedures Procedures    Medications Ordered in ED Medications  oxyCODONE-acetaminophen (PERCOCET/ROXICET) 5-325 MG per tablet 1 tablet (1 tablet Oral Given  03/06/22 0911)  ondansetron (ZOFRAN-ODT) disintegrating tablet 8 mg (8 mg Oral Given 03/06/22 0911)  sodium chloride (PF) 0.9 % injection (10 mLs  Given 03/06/22 1400)  iohexol (OMNIPAQUE) 300 MG/ML solution 100 mL (100 mLs Intravenous Contrast Given 03/06/22 1227)  pantoprazole (PROTONIX) injection 40 mg (40 mg Intravenous Given 03/06/22 1449)  alum & mag hydroxide-simeth (MAALOX/MYLANTA) 200-200-20 MG/5ML suspension 30 mL (30 mLs Oral Given 03/06/22 1437)  morphine (PF) 4 MG/ML injection 4 mg (4 mg Intravenous Given 03/06/22 1450)  sodium chloride 0.9 % bolus 1,000 mL (0 mLs Intravenous Stopped 03/06/22 1642)    ED Course/ Medical Decision Making/ A&P Clinical Course as of 03/06/22 1840  Wed Mar 06, 2022  1409 Day 3 of NV and abd pain. Normal Bms. Can tolerate little water.  [WF]    Clinical Course  User Index [WF] Gailen ShelterFondaw, Melanie Pellot S, [place]PA                           Medical Decision Making Risk OTC drugs. Prescription drug management.   This patient presents to the ED for concern of nausea vomiting abdominal pain, this involves a number of treatment options, and is a complaint that carries with it a moderate to high risk of complications and morbidity. A differential diagnosis was considered for the patient's symptoms which is discussed below:   The causes of generalized abdominal pain include but are not limited to AAA, mesenteric ischemia, appendicitis, diverticulitis, DKA, gastritis, gastroenteritis, AMI, nephrolithiasis, pancreatitis, peritonitis, adrenal insufficiency,lead poisoning, iron toxicity, intestinal ischemia, constipation, UTI,SBO/LBO, splenic rupture, biliary disease, IBD, IBS, PUD, or hepatitis.   Co morbidities: Discussed in HPI   Brief History:  Patient is a 41 year old male with past medical history significant for   He is present emergency room today with complaints of generalized abdominal pain but indicates epigastrium when showing me when he is primarily having pain.  He states his symptoms of been ongoing for 3 days.  He states that he is feeling nauseous has had several episodes of nonbloody nonbilious emesis.  He states that he uses Goody's powder frequently.  He does use marijuana but not frequently.  Drinks alcohol occasionally but not heavily. Denies any chest pain or difficulty breathing.  He states he has had normal bowel movements with no BRBPR or pain.  He is not suffering from constipation or diarrhea.  Can tolerate water and bland foods.  Denies any fever or urinary symptoms such as dysuria frequency urgency or hematuria.   Patient has had some issues with food impactions in the past.  He has never seen a gastroenterologist or had a dilation.    EMR reviewed including pt PMHx, past surgical history and past visits to ER.   See HPI for more  details   Lab Tests:   I ordered and independently interpreted labs. Labs notable for leukocytosis and elevated hemoglobin consistent with dehydration and hemoconcentration.  Lipase within normal limits doubt pancreatitis.  CMP unremarkable.   Imaging Studies:  NAD. I personally reviewed all imaging studies and no acute abnormality found. I agree with radiology interpretation.  CT abdomen pelvis without abnormal findings.  No appendicitis or evidence of perforation.  He was however found to have some inflammatory changes in caudal  EsophagusIMPRESSION:  1. No acute abnormality in the abdomen or pelvis.  2. Scattered colonic diverticulosis without findings of acute  diverticulitis.  3. Distal esophageal wall thickening with prominent paraesophageal  lymph nodes nonspecific and possibly  reflecting esophagitis with  adjacent reactive lymph nodes. However, suggest further evaluation  with endoscopy to assess for underlying lesion.      Electronically Signed    By: Maudry Mayhew M.D.    On: 03/06/2022 12:47    Cardiac Monitoring:  NA NA   Medicines ordered:  I ordered medication including GI cocktail which had significant improvement in his pain.  Also given small dose of morphine, Protonix, Zofran for pain and nausea Reevaluation of the patient after these medicines showed that the patient improved I have reviewed the patients home medicines and have made adjustments as needed   Critical Interventions:     Consults/Attending Physician      Reevaluation:  After the interventions noted above I re-evaluated patient and found that they have :resolved   Social Determinants of Health:      Problem List / ED Course:  Abdominal pain nausea and vomiting completely resolved after 1 dose of Zofran and GI cocktail and morphine.  He has no abdominal tenderness on my reexamination.  CT abdomen pelvis without any abnormal findings apart from evidence of esophagitis.   I had a lengthy discussion with patient about the importance of follow-up with gastroenterology and he understands this and is calling the office to make an appointment now.  I discussed with him the importance of return precautions.  He will hydrate, hold off on any more Goody's powders or any other NSAIDs.  We will continue take Protonix   Dispostion:  After consideration of the diagnostic results and the patients response to treatment, I feel that the patent would benefit from close outpatient follow-up.  Return precautions discussed   Final Clinical Impression(s) / ED Diagnoses Final diagnoses:  Epigastric pain  Esophagitis    Rx / DC Orders ED Discharge Orders          Ordered    ondansetron (ZOFRAN-ODT) 4 MG disintegrating tablet  Every 8 hours PRN        03/06/22 1629              Gailen Shelter, [place] 03/06/22 1843    Gwyneth Sprout, MD 03/10/22 [phone]

## 2022-03-06 NOTE — ED Notes (Signed)
ED PA at BS 

## 2022-03-06 NOTE — ED Triage Notes (Signed)
Pt reports abd pain x2 days with n/v

## 2022-04-16 ENCOUNTER — Emergency Department (HOSPITAL_COMMUNITY)
Admission: EM | Admit: 2022-04-16 | Discharge: 2022-04-16 | Discharge status: Against Medical Advice | Payer: Self-pay | Attending: Emergency Medicine | Admitting: Emergency Medicine

## 2022-04-16 ENCOUNTER — Emergency Department (HOSPITAL_COMMUNITY): Payer: Self-pay

## 2022-04-16 ENCOUNTER — Encounter (HOSPITAL_COMMUNITY): Payer: Self-pay

## 2022-04-16 DIAGNOSIS — Z5321 Procedure and treatment not carried out due to patient leaving prior to being seen by health care provider: Secondary | ICD-10-CM | POA: Insufficient documentation

## 2022-04-16 DIAGNOSIS — J45901 Unspecified asthma with (acute) exacerbation: Secondary | ICD-10-CM | POA: Insufficient documentation

## 2022-04-16 LAB — CBC WITH DIFFERENTIAL/PLATELET
Abs Immature Granulocytes: 0.01 10*3/uL (ref 0.00–0.07)
Basophils Absolute: 0 10*3/uL (ref 0.0–0.1)
Basophils Relative: 0 %
Eosinophils Absolute: 0.5 10*3/uL (ref 0.0–0.5)
Eosinophils Relative: 7 %
HCT: 46.5 % (ref 39.0–52.0)
Hemoglobin: 16.2 g/dL (ref 13.0–17.0)
Immature Granulocytes: 0 %
Lymphocytes Relative: 27 %
Lymphs Abs: 2 10*3/uL (ref 0.7–4.0)
MCH: 33.3 pg (ref 26.0–34.0)
MCHC: 34.8 g/dL (ref 30.0–36.0)
MCV: 95.7 fL (ref 80.0–100.0)
Monocytes Absolute: 0.8 10*3/uL (ref 0.1–1.0)
Monocytes Relative: 11 %
Neutro Abs: 4.1 10*3/uL (ref 1.7–7.7)
Neutrophils Relative %: 55 %
Platelets: 279 10*3/uL (ref 150–400)
RBC: 4.86 MIL/uL (ref 4.22–5.81)
RDW: 12.5 % (ref 11.5–15.5)
WBC: 7.4 10*3/uL (ref 4.0–10.5)
nRBC: 0 % (ref 0.0–0.2)

## 2022-04-16 LAB — BASIC METABOLIC PANEL
Anion gap: 10 (ref 5–15)
BUN: 15 mg/dL (ref 6–20)
CO2: 21 mmol/L — ABNORMAL LOW (ref 22–32)
Calcium: 9.4 mg/dL (ref 8.9–10.3)
Chloride: 105 mmol/L (ref 98–111)
Creatinine, Ser: 0.83 mg/dL (ref 0.61–1.24)
GFR, Estimated: >60 mL/min (ref >60)
Glucose, Bld: 99 mg/dL (ref 70–99)
Potassium: 4.1 mmol/L (ref 3.5–5.1)
Sodium: 136 mmol/L (ref 135–145)

## 2022-04-16 MED ORDER — IPRATROPIUM-ALBUTEROL 0.5-2.5 (3) MG/3ML IN SOLN: 3.0000 mL | Freq: Once | RESPIRATORY_TRACT | Status: DC

## 2022-04-16 MED ORDER — DEXAMETHASONE 4 MG PO TABS: 10.0000 mg | ORAL_TABLET | Freq: Once | ORAL | Status: DC

## 2022-04-16 NOTE — ED Triage Notes (Signed)
Pt arrived via POV, c/o SOB, feels like asthma exacerbation. Does not have any inhalers or neb tx at home.

## 2022-04-16 NOTE — ED Provider Triage Note (Signed)
Emergency Medicine Provider Triage Evaluation Note  Devin [name] , a 41 y.o. male  was evaluated in triage.  Pt complains of shortness of breath.  States he believes he is having an exacerbation of his asthma.  [place], states usually has exacerbations with fluctuations in temperatures.  Does not have inhaler at home.  Has not tried anything for relief.  Denies recent URI symptoms, though reports chest tightness as well.  Denies chest pain, fevers, sore throat, difficulty swallowing.  Review of Systems  Positive:  Negative: See above  Physical Exam  BP (!) 150/111 (BP Location: Left Arm)   Pulse 74   Temp 98 F (36.7 C) (Oral)   Resp 20   SpO2 98%  Gen:   Awake, no distress   Resp:  Normal effort mild wheezing with adequate air movement. MSK:   Moves extremities without difficulty  Other:  Airway patent.  Poor dentition.  Does not appear cyanotic or pale.  When auscultating lungs, patient becomes tachypneic.  However when communicating, patient appears to be breathing without difficulty  Medical Decision Making  Medically screening exam initiated at 11:22 AM.  Appropriate orders placed.  Devin [name] was informed that the remainder of the evaluation will be completed by another provider, this initial triage assessment does not replace that evaluation, and the importance of remaining in the ED until their evaluation is complete.     Cecil Cobbs, PA-C 04/16/22 1126

## 2022-04-16 NOTE — ED Notes (Signed)
I called patient for vital sign recheck and no one responded 

## 2022-04-16 NOTE — ED Notes (Signed)
Called pt x 2 no response in lobby.  

## 2022-04-26 ENCOUNTER — Other Ambulatory Visit (HOSPITAL_COMMUNITY): Payer: Self-pay

## 2023-04-28 ENCOUNTER — Emergency Department (HOSPITAL_COMMUNITY)
Admission: EM | Admit: 2023-04-28 | Discharge: 2023-04-28 | Disposition: A | Discharge status: Home / Self Care | Payer: No Typology Code available for payment source | Attending: Emergency Medicine | Admitting: Emergency Medicine

## 2023-04-28 ENCOUNTER — Emergency Department (HOSPITAL_COMMUNITY): Payer: No Typology Code available for payment source

## 2023-04-28 DIAGNOSIS — Z7952 Long term (current) use of systemic steroids: Secondary | ICD-10-CM | POA: Diagnosis not present

## 2023-04-28 DIAGNOSIS — R0602 Shortness of breath: Secondary | ICD-10-CM | POA: Diagnosis present

## 2023-04-28 DIAGNOSIS — J45901 Unspecified asthma with (acute) exacerbation: Secondary | ICD-10-CM | POA: Insufficient documentation

## 2023-04-28 MED ORDER — ALBUTEROL SULFATE HFA 108 (90 BASE) MCG/ACT IN AERS
1.0000 | INHALATION_SPRAY | RESPIRATORY_TRACT | Status: AC
  Administered 2023-04-28: 1 via RESPIRATORY_TRACT
  Filled 2023-04-28: qty 6.7

## 2023-04-28 MED ORDER — PREDNISONE 20 MG PO TABS: 40.0000 mg | ORAL_TABLET | Freq: Every day | ORAL | 0 refills | Status: DC

## 2023-04-28 NOTE — ED Triage Notes (Signed)
 Patient arrived with complaints of an asthma attack. Ran out of his rescue inhaler. Given two duoneb, mag, and solumedrol with EMS.

## 2023-04-28 NOTE — ED Provider Notes (Signed)
 [place] EMERGENCY DEPARTMENT AT [facility] Provider Note   CSN: [id number] Arrival date & time: 04/28/23  9688     History  Chief Complaint  Patient presents with   Asthma    Devin [name] is a 43 y.o. male with medical history of asthma, Polar 1 disorder, GERD.  Patient presents to ED for evaluation of shortness of breath, asthma attack.  Reports that he woke up this morning feeling very short of breath, wheezing.  States he felt as if he was having an asthma attack.  Reports he does not have an inhaler at this time as he is waiting for a paycheck to clear so he can purchase one.  Denies any fevers at home, lightheadedness, dizziness, weakness, chest pain.  Reports that he was given Solu-Medrol , DuoNeb and mag with EMS.  Reports that he feels much better at this time but requesting to be sent home with inhaler.   Asthma Associated symptoms include shortness of breath. Pertinent negatives include no chest pain.       Home Medications Prior to Admission medications   Medication Sig Start Date End Date Taking? Authorizing Provider  predniSONE  (DELTASONE ) 20 MG tablet Take 2 tablets (40 mg total) by mouth daily. 04/28/23  Yes Ruthell Lonni FALCON, PA-C  albuterol  (VENTOLIN  HFA) 108 (90 Base) MCG/ACT inhaler Inhale 1-2 puffs into the lungs every 6 (six) hours as needed for wheezing or shortness of breath. 03/19/21   Joldersma, Logan, PA-C  celecoxib  (CELEBREX ) 200 MG capsule Take 1 capsule (200 mg total) by mouth 2 (two) times daily. Patient not taking: Reported on 08/27/2021 08/09/21   Harris, Abigail, PA-C  gabapentin  (NEURONTIN ) 400 MG capsule Take 1 capsule (400 mg total) by mouth 3 (three) times daily. For agitation Patient not taking: Reported on 11/15/2021 02/09/18   Collene Gouge I, NP  ondansetron  (ZOFRAN -ODT) 4 MG disintegrating tablet Take 1 tablet (4 mg total) by mouth every 8 (eight) hours as needed for nausea or vomiting. 03/06/22   Neldon Hamp RAMAN, PA   pantoprazole  (PROTONIX ) 40 MG tablet Take 1 tablet (40 mg total) by mouth daily. 03/06/22   Neldon Hamp RAMAN, PA      Allergies    Augmentin [amoxicillin -pot clavulanate] and Other    Review of Systems   Review of Systems  Constitutional:  Negative for fever.  Respiratory:  Positive for shortness of breath and wheezing.   Cardiovascular:  Negative for chest pain.  Neurological:  Negative for dizziness and light-headedness.  All other systems reviewed and are negative.   Physical Exam Updated Vital Signs BP (!) 157/97 (BP Location: Right Arm)   Pulse (!) 105   Temp 97.9 F (36.6 C) (Oral)   Resp 17   Ht 5' 10 (1.778 m)   Wt 88.5 kg   SpO2 94%   BMI 27.98 kg/m  Physical Exam Vitals and nursing note reviewed.  Constitutional:      General: He is not in acute distress.    Appearance: He is well-developed. He is not toxic-appearing.  HENT:     Head: Normocephalic and atraumatic.  Eyes:     Conjunctiva/sclera: Conjunctivae normal.  Cardiovascular:     Rate and Rhythm: Normal rate and regular rhythm.     Heart sounds: No murmur heard. Pulmonary:     Effort: Pulmonary effort is normal. No respiratory distress.     Breath sounds: Normal breath sounds. No wheezing.     Comments: Lungs clear to auscultation bilaterally Abdominal:  Palpations: Abdomen is soft.     Tenderness: There is no abdominal tenderness.  Musculoskeletal:        General: No swelling.     Cervical back: Neck supple.  Skin:    General: Skin is warm and dry.     Capillary Refill: Capillary refill takes less than 2 seconds.  Neurological:     Mental Status: He is alert.  Psychiatric:        Mood and Affect: Mood normal.     ED Results / Procedures / Treatments   Labs (all labs ordered are listed, but only abnormal results are displayed) Labs Reviewed - No data to display  EKG None  Radiology DG Chest 2 View Result Date: 04/28/2023 CLINICAL DATA:  Shortness of breath. EXAM: CHEST - 2 VIEW  COMPARISON:  04/16/2022 FINDINGS: The lungs are clear without focal pneumonia, edema, pneumothorax or pleural effusion. Subtle streaky density left base suggest atelectasis. The cardiopericardial silhouette is within normal limits for size. No acute bony abnormality. IMPRESSION: No acute cardiopulmonary findings. Electronically Signed   By: Camellia Candle M.D.   On: 04/28/2023 05:55    Procedures Procedures   Medications Ordered in ED Medications  albuterol  (VENTOLIN  HFA) 108 (90 Base) MCG/ACT inhaler 1 puff (1 puff Inhalation Given 04/28/23 0536)    ED Course/ Medical Decision Making/ A&P  Medical Decision Making Amount and/or Complexity of Data Reviewed Radiology: ordered.  Risk Prescription drug management.   43 year old male presents for evaluation.  Please see HPI for further details.  On examination patient is afebrile, tachycardic initially however on recheck of vital signs patient pulse rate is normalized.  Lung sounds are clear bilaterally, he is not hypoxic.  Abdomen is soft and compressible.  Neurological examination is at baseline.  Will collect chest x-ray and provide patient with albuterol  inhaler.  He was provided with DuoNeb, Solu-Medrol  and magnesium  with EMS.  Reports that he feels much better at this time.  Chest x-ray collected to assess for any kind of pneumonia, underlying abnormality.  Chest x-ray clear.  Patient provided albuterol  inhaler.  He will follow-up with his PCP.  Stable to discharge home.   Final Clinical Impression(s) / ED Diagnoses Final diagnoses:  Exacerbation of asthma, unspecified asthma severity, unspecified whether persistent    Rx / DC Orders ED Discharge Orders          Ordered    predniSONE  (DELTASONE ) 20 MG tablet  Daily        04/28/23 0619              Ruthell Lonni FALCON, PA-C 04/28/23 9380    Melvenia Motto, MD 04/28/23 [phone]

## 2023-04-28 NOTE — Discharge Instructions (Signed)
 Please follow-up with your PCP regarding your asthma.  Please pick up the steroids are sent to your pharmacy.  You will take steroids for the next 4 days, 40 mg.  Please utilize albuterol  inhaler provided to you every 6 hours as needed for shortness of breath and wheezing.  Return to ED with any new or worsening symptoms.

## 2023-05-21 ENCOUNTER — Emergency Department (HOSPITAL_COMMUNITY): Payer: No Typology Code available for payment source

## 2023-05-21 ENCOUNTER — Emergency Department (HOSPITAL_COMMUNITY)
Admission: EM | Admit: 2023-05-21 | Discharge: 2023-05-21 | Disposition: A | Discharge status: Home / Self Care | Payer: No Typology Code available for payment source | Attending: Emergency Medicine | Admitting: Emergency Medicine

## 2023-05-21 ENCOUNTER — Other Ambulatory Visit: Payer: Self-pay

## 2023-05-21 DIAGNOSIS — Z20822 Contact with and (suspected) exposure to covid-19: Secondary | ICD-10-CM | POA: Insufficient documentation

## 2023-05-21 DIAGNOSIS — J449 Chronic obstructive pulmonary disease, unspecified: Secondary | ICD-10-CM | POA: Diagnosis not present

## 2023-05-21 DIAGNOSIS — J09X2 Influenza due to identified novel influenza A virus with other respiratory manifestations: Secondary | ICD-10-CM | POA: Diagnosis not present

## 2023-05-21 DIAGNOSIS — Z7951 Long term (current) use of inhaled steroids: Secondary | ICD-10-CM | POA: Insufficient documentation

## 2023-05-21 DIAGNOSIS — J45909 Unspecified asthma, uncomplicated: Secondary | ICD-10-CM | POA: Insufficient documentation

## 2023-05-21 DIAGNOSIS — J101 Influenza due to other identified influenza virus with other respiratory manifestations: Secondary | ICD-10-CM

## 2023-05-21 DIAGNOSIS — R059 Cough, unspecified: Secondary | ICD-10-CM | POA: Diagnosis present

## 2023-05-21 LAB — CBC WITH DIFFERENTIAL/PLATELET
Abs Immature Granulocytes: 0.03 10*3/uL (ref 0.00–0.07)
Basophils Absolute: 0 10*3/uL (ref 0.0–0.1)
Basophils Relative: 0 %
Eosinophils Absolute: 0 10*3/uL (ref 0.0–0.5)
Eosinophils Relative: 0 %
HCT: 45.7 % (ref 39.0–52.0)
Hemoglobin: 15.3 g/dL (ref 13.0–17.0)
Immature Granulocytes: 0 %
Lymphocytes Relative: 10 %
Lymphs Abs: 0.9 10*3/uL (ref 0.7–4.0)
MCH: 31.4 pg (ref 26.0–34.0)
MCHC: 33.5 g/dL (ref 30.0–36.0)
MCV: 93.8 fL (ref 80.0–100.0)
Monocytes Absolute: 0.9 10*3/uL (ref 0.1–1.0)
Monocytes Relative: 10 %
Neutro Abs: 7.4 10*3/uL (ref 1.7–7.7)
Neutrophils Relative %: 80 %
Platelets: 237 10*3/uL (ref 150–400)
RBC: 4.87 MIL/uL (ref 4.22–5.81)
RDW: 14.4 % (ref 11.5–15.5)
WBC: 9.2 10*3/uL (ref 4.0–10.5)
nRBC: 0 % (ref 0.0–0.2)

## 2023-05-21 LAB — RESP PANEL BY RT-PCR (RSV, FLU A&B, COVID)  RVPGX2
Influenza A by PCR: POSITIVE — AB
Influenza B by PCR: NEGATIVE
Resp Syncytial Virus by PCR: NEGATIVE
SARS Coronavirus 2 by RT PCR: NEGATIVE

## 2023-05-21 MED ORDER — ACETAMINOPHEN 500 MG PO TABS
1000.0000 mg | ORAL_TABLET | Freq: Once | ORAL | Status: AC
  Administered 2023-05-21: 1000 mg via ORAL
  Filled 2023-05-21: qty 2

## 2023-05-21 MED ORDER — ONDANSETRON 4 MG PO TBDP
4.0000 mg | ORAL_TABLET | Freq: Once | ORAL | Status: AC
  Administered 2023-05-21: 4 mg via ORAL
  Filled 2023-05-21: qty 1

## 2023-05-21 MED ORDER — IPRATROPIUM-ALBUTEROL 0.5-2.5 (3) MG/3ML IN SOLN
3.0000 mL | Freq: Once | RESPIRATORY_TRACT | Status: AC
  Administered 2023-05-21: 3 mL via RESPIRATORY_TRACT
  Filled 2023-05-21: qty 3

## 2023-05-21 MED ORDER — BENZONATATE 100 MG PO CAPS: 100.0000 mg | ORAL_CAPSULE | Freq: Three times a day (TID) | ORAL | 0 refills | Status: DC

## 2023-05-21 MED ORDER — ONDANSETRON 4 MG PO TBDP: 4.0000 mg | ORAL_TABLET | Freq: Three times a day (TID) | ORAL | 0 refills | Status: DC | PRN

## 2023-05-21 MED ORDER — IBUPROFEN 800 MG PO TABS
800.0000 mg | ORAL_TABLET | Freq: Once | ORAL | Status: AC
  Administered 2023-05-21: 800 mg via ORAL
  Filled 2023-05-21: qty 1

## 2023-05-21 MED ORDER — ALBUTEROL SULFATE HFA 108 (90 BASE) MCG/ACT IN AERS
1.0000 | INHALATION_SPRAY | RESPIRATORY_TRACT | Status: DC
  Filled 2023-05-21: qty 6.7

## 2023-05-21 NOTE — ED Triage Notes (Signed)
Patient to ED by EMS with c/o cough

## 2023-05-21 NOTE — Discharge Instructions (Signed)
Please alternate Tylenol and ibuprofen regularly for muscle aches and fever.  You can take Tylenol 1000 mg 4 times a day. Zofran to your pharmacy to take as needed for nausea. Please use albuterol inhaler as needed for shortness of breath. Sent Tessalon Perles to your pharmacy to take as needed for cough. Well-hydrated with primarily water he can alternate Pedialyte and Gatorade as well. Turn to ER if you have any new or worsening symptoms.

## 2023-05-21 NOTE — ED Provider Notes (Signed)
[place] EMERGENCY DEPARTMENT AT [facility] Provider Note   CSN: [id number] Arrival date & time: 05/21/23  1001     History  Chief Complaint  Patient presents with   Cough    Devin [name] is a 43 y.o. male with past medical history of asthma, COPD, bipolar 2 disorder presenting to emergency room with 3 days of cough, congestion, fever, body aches.  He does also endorse some shortness of breath.  Orts he has been having to use inhaler more than normal however has had an breath.  Has not tried anything for fever or muscle aches at home.  Has not taken any medications today.  Denies any chest pain, lower extremity edema, abdominal pain.   Cough      Home Medications Prior to Admission medications   Medication Sig Start Date End Date Taking? Authorizing Provider  albuterol (VENTOLIN HFA) 108 (90 Base) MCG/ACT inhaler Inhale 1-2 puffs into the lungs every 6 (six) hours as needed for wheezing or shortness of breath. 03/19/21   Placido Sou, PA-C  celecoxib (CELEBREX) 200 MG capsule Take 1 capsule (200 mg total) by mouth 2 (two) times daily. Patient not taking: Reported on 08/27/2021 08/09/21   Arthor Captain, PA-C  gabapentin (NEURONTIN) 400 MG capsule Take 1 capsule (400 mg total) by mouth 3 (three) times daily. For agitation Patient not taking: Reported on 11/15/2021 02/09/18   Armandina Stammer I, NP  ondansetron (ZOFRAN-ODT) 4 MG disintegrating tablet Take 1 tablet (4 mg total) by mouth every 8 (eight) hours as needed for nausea or vomiting. 03/06/22   Gailen Shelter, PA  pantoprazole (PROTONIX) 40 MG tablet Take 1 tablet (40 mg total) by mouth daily. 03/06/22   Gailen Shelter, PA  predniSONE (DELTASONE) 20 MG tablet Take 2 tablets (40 mg total) by mouth daily. 04/28/23   Al Decant, PA-C      Allergies    Augmentin [amoxicillin-pot clavulanate] and Other    Review of Systems   Review of Systems  Respiratory:  Positive for cough.     Physical  Exam Updated Vital Signs BP (!) 148/97 (BP Location: Right Arm)   Pulse (!) 121   Temp 99.8 F (37.7 C)   Resp 18   Ht 5\' 10"  (1.778 m)   Wt 89 kg   SpO2 100%   BMI 28.15 kg/m  Physical Exam Vitals and nursing note reviewed.  Constitutional:      General: He is not in acute distress.    Appearance: He is not toxic-appearing.  HENT:     Head: Normocephalic and atraumatic.  Eyes:     General: No scleral icterus.    Conjunctiva/sclera: Conjunctivae normal.  Cardiovascular:     Rate and Rhythm: Normal rate and regular rhythm.     Pulses: Normal pulses.     Heart sounds: Normal heart sounds.  Pulmonary:     Effort: Pulmonary effort is normal. No respiratory distress.     Breath sounds: No stridor. Wheezing present. No rhonchi.     Comments: No increased respiratory rate, no signs of distress. Abdominal:     General: Abdomen is flat. Bowel sounds are normal.     Palpations: Abdomen is soft.     Tenderness: There is no abdominal tenderness.  Musculoskeletal:     Right lower leg: No edema.     Left lower leg: No edema.  Skin:    General: Skin is warm and dry.     Findings: No  lesion.  Neurological:     General: No focal deficit present.     Mental Status: He is alert and oriented to person, place, and time. Mental status is at baseline.     ED Results / Procedures / Treatments   Labs (all labs ordered are listed, but only abnormal results are displayed) Labs Reviewed  RESP PANEL BY RT-PCR (RSV, FLU A&B, COVID)  RVPGX2 - Abnormal; Notable for the following components:      Result Value   Influenza A by PCR POSITIVE (*)    All other components within normal limits  CBC WITH DIFFERENTIAL/PLATELET  BASIC METABOLIC PANEL    EKG None  Radiology DG Chest 2 View Result Date: 05/21/2023 CLINICAL DATA:  Cough. EXAM: CHEST - 2 VIEW COMPARISON:  Chest radiograph dated April 28, 2023. FINDINGS: The heart size and mediastinal contours are within normal limits. No focal  consolidation, pleural effusion, or pneumothorax. No acute osseous abnormality. IMPRESSION: No acute cardiopulmonary findings. Electronically Signed   By: Hart Robinsons M.D.   On: 05/21/2023 11:57    Procedures Procedures    Medications Ordered in ED Medications  ipratropium-albuterol (DUONEB) 0.5-2.5 (3) MG/3ML nebulizer solution 3 mL (3 mLs Nebulization Given 05/21/23 1049)  acetaminophen (TYLENOL) tablet 1,000 mg (1,000 mg Oral Given 05/21/23 1049)    ED Course/ Medical Decision Making/ A&P Clinical Course as of 05/21/23 1331  Wed May 21, 2023  1055 Temp 103F in triage [JB]    Clinical Course User Index [JB] Nazaria Riesen, Horald Chestnut, PA-C                                 Medical Decision Making Amount and/or Complexity of Data Reviewed Labs: ordered. Radiology: ordered.  Risk OTC drugs. Prescription drug management.   Ronnald Collum 43 y.o. presented today for URI like symptoms. Working DDx that I considered at this time includes, but not limited to, viral illness, pharyngitis, mono, sinusitis, electrolyte abnormality, AOM.  R/o DDx: these additional diagnoses are not consistent with patient's history, presentation, physical exam, labs/imaging findings.   Labs:  Respiratory Panel: Flu A  Imaging:  Chest x-ray without acute findings   Problem List / ED Course / Critical interventions / Medication management  Reporting to emergency room with flulike symptoms.  Symptoms have been ongoing for 3 days.  On arrival he is hemodynamically stable.  He does have fever and chills at home.  Upon assessing temperature in triage patient did have fever of 102 F.  Fever went down with Tylenol.  Upon reassessment he still endorses muscle aches, I will order ibuprofen as well.  Patient shortness of breath improved after DuoNeb.  In no acute distress and is well-appearing, hemodynamically stable.  Given duration of symptoms patient is likely not candidate for Tamiflu. Stable for discharge.  Given return precautions. Given symptom management.  I ordered medication including Tylenol and ibuprofen for fever, duo neb for shortness of breath Reevaluation of the patient after these medicines showed that the patient improved Patients vitals assessed. Upon arrival patient is hemodynamically stable.  I have reviewed the patients home medicines and have made adjustments as needed     Plan:  Patient given strict return precautions.  Sent home with albuterol inhaler.  Return with new or worsening symptoms.  Stable for discharge.        Final Clinical Impression(s) / ED Diagnoses Final diagnoses:  Influenza A    Rx /  DC Orders ED Discharge Orders     None         Reinaldo Raddle 05/21/23 1846    Terald Sleeper, MD 05/22/23 [phone]

## 2023-05-21 NOTE — ED Provider Triage Note (Signed)
Emergency Medicine Provider Triage Evaluation Note  Devin [name] , a 43 y.o. male  was evaluated in triage.  Pt complains of flu like symptoms.  Reports he has "felt like crap" 3 days.  Has sick contact at home.  Reports muscle aches, fevers, chills, shortness of breath, cough, congestion.  Patient has past medical history of asthma.  Has been using inhaler at home without much relief in shortness of breath.  In room patient has fever of 103 F.  Will give Tylenol.  Also giving DuoNeb.  Review of Systems  Positive: See hpi Negative: Chest pain, chest tightness   Physical Exam  BP (!) 148/97 (BP Location: Right Arm)   Pulse (!) 121   Temp 99.8 F (37.7 C)   Resp 18   Ht 5\' 10"  (1.778 m)   Wt 89 kg   SpO2 100%   BMI 28.15 kg/m  Gen:   Awake, no distress   Resp:  Normal effort  MSK:   Moves extremities without difficulty  Other:  Sinus tachycardia, mild wheezing in upper lung feilds  Medical Decision Making  Medically screening exam initiated at 10:30 AM.  Appropriate orders placed.  Devin [name] was informed that the remainder of the evaluation will be completed by another provider, this initial triage assessment does not replace that evaluation, and the importance of remaining in the ED until their evaluation is complete.     Smitty Knudsen, PA-C 05/21/23 1032

## 2023-06-02 ENCOUNTER — Encounter (HOSPITAL_COMMUNITY): Payer: Self-pay

## 2023-06-02 ENCOUNTER — Inpatient Hospital Stay (HOSPITAL_COMMUNITY)
Admission: EM | Admit: 2023-06-02 | Discharge: 2023-06-05 | DRG: 202 | Disposition: A | Discharge status: Home / Self Care | Payer: No Typology Code available for payment source | Attending: Internal Medicine | Admitting: Internal Medicine

## 2023-06-02 ENCOUNTER — Other Ambulatory Visit: Payer: Self-pay

## 2023-06-02 ENCOUNTER — Emergency Department (HOSPITAL_COMMUNITY): Payer: No Typology Code available for payment source

## 2023-06-02 DIAGNOSIS — D72829 Elevated white blood cell count, unspecified: Secondary | ICD-10-CM | POA: Diagnosis present

## 2023-06-02 DIAGNOSIS — R0603 Acute respiratory distress: Secondary | ICD-10-CM | POA: Diagnosis present

## 2023-06-02 DIAGNOSIS — J45901 Unspecified asthma with (acute) exacerbation: Secondary | ICD-10-CM | POA: Diagnosis not present

## 2023-06-02 DIAGNOSIS — E876 Hypokalemia: Secondary | ICD-10-CM | POA: Diagnosis not present

## 2023-06-02 DIAGNOSIS — Z87891 Personal history of nicotine dependence: Secondary | ICD-10-CM

## 2023-06-02 DIAGNOSIS — J441 Chronic obstructive pulmonary disease with (acute) exacerbation: Principal | ICD-10-CM | POA: Diagnosis present

## 2023-06-02 DIAGNOSIS — F319 Bipolar disorder, unspecified: Secondary | ICD-10-CM | POA: Diagnosis present

## 2023-06-02 DIAGNOSIS — K219 Gastro-esophageal reflux disease without esophagitis: Secondary | ICD-10-CM | POA: Diagnosis present

## 2023-06-02 DIAGNOSIS — F419 Anxiety disorder, unspecified: Secondary | ICD-10-CM | POA: Diagnosis present

## 2023-06-02 DIAGNOSIS — Z833 Family history of diabetes mellitus: Secondary | ICD-10-CM

## 2023-06-02 DIAGNOSIS — R7401 Elevation of levels of liver transaminase levels: Secondary | ICD-10-CM | POA: Diagnosis present

## 2023-06-02 DIAGNOSIS — I493 Ventricular premature depolarization: Secondary | ICD-10-CM | POA: Diagnosis present

## 2023-06-02 DIAGNOSIS — Z8619 Personal history of other infectious and parasitic diseases: Secondary | ICD-10-CM

## 2023-06-02 DIAGNOSIS — Z880 Allergy status to penicillin: Secondary | ICD-10-CM

## 2023-06-02 DIAGNOSIS — Z83438 Family history of other disorder of lipoprotein metabolism and other lipidemia: Secondary | ICD-10-CM

## 2023-06-02 DIAGNOSIS — E872 Acidosis, unspecified: Secondary | ICD-10-CM | POA: Diagnosis present

## 2023-06-02 DIAGNOSIS — F909 Attention-deficit hyperactivity disorder, unspecified type: Secondary | ICD-10-CM | POA: Diagnosis present

## 2023-06-02 DIAGNOSIS — F3181 Bipolar II disorder: Secondary | ICD-10-CM | POA: Diagnosis present

## 2023-06-02 DIAGNOSIS — E871 Hypo-osmolality and hyponatremia: Secondary | ICD-10-CM | POA: Diagnosis present

## 2023-06-02 DIAGNOSIS — Z825 Family history of asthma and other chronic lower respiratory diseases: Secondary | ICD-10-CM

## 2023-06-02 DIAGNOSIS — Z1152 Encounter for screening for COVID-19: Secondary | ICD-10-CM

## 2023-06-02 DIAGNOSIS — Z79899 Other long term (current) drug therapy: Secondary | ICD-10-CM

## 2023-06-02 DIAGNOSIS — Z8249 Family history of ischemic heart disease and other diseases of the circulatory system: Secondary | ICD-10-CM

## 2023-06-02 DIAGNOSIS — E8809 Other disorders of plasma-protein metabolism, not elsewhere classified: Secondary | ICD-10-CM | POA: Diagnosis present

## 2023-06-02 DIAGNOSIS — F332 Major depressive disorder, recurrent severe without psychotic features: Secondary | ICD-10-CM | POA: Diagnosis present

## 2023-06-02 LAB — CBC WITH DIFFERENTIAL/PLATELET
Abs Immature Granulocytes: 0.07 10*3/uL (ref 0.00–0.07)
Basophils Absolute: 0 10*3/uL (ref 0.0–0.1)
Basophils Relative: 0 %
Eosinophils Absolute: 0 10*3/uL (ref 0.0–0.5)
Eosinophils Relative: 0 %
HCT: 44.8 % (ref 39.0–52.0)
Hemoglobin: 15.3 g/dL (ref 13.0–17.0)
Immature Granulocytes: 1 %
Lymphocytes Relative: 12 %
Lymphs Abs: 1.5 10*3/uL (ref 0.7–4.0)
MCH: 30.6 pg (ref 26.0–34.0)
MCHC: 34.2 g/dL (ref 30.0–36.0)
MCV: 89.6 fL (ref 80.0–100.0)
Monocytes Absolute: 1.2 10*3/uL — ABNORMAL HIGH (ref 0.1–1.0)
Monocytes Relative: 10 %
Neutro Abs: 9 10*3/uL — ABNORMAL HIGH (ref 1.7–7.7)
Neutrophils Relative %: 77 %
Platelets: 317 10*3/uL (ref 150–400)
RBC: 5 MIL/uL (ref 4.22–5.81)
RDW: 14.1 % (ref 11.5–15.5)
WBC: 11.8 10*3/uL — ABNORMAL HIGH (ref 4.0–10.5)
nRBC: 0 % (ref 0.0–0.2)

## 2023-06-02 LAB — TROPONIN I (HIGH SENSITIVITY)
Troponin I (High Sensitivity): 16 ng/L (ref <18)
Troponin I (High Sensitivity): 15 ng/L (ref <18)

## 2023-06-02 LAB — RESP PANEL BY RT-PCR (RSV, FLU A&B, COVID)  RVPGX2
Influenza A by PCR: NEGATIVE
Influenza B by PCR: NEGATIVE
Resp Syncytial Virus by PCR: NEGATIVE
SARS Coronavirus 2 by RT PCR: NEGATIVE

## 2023-06-02 LAB — BASIC METABOLIC PANEL
Anion gap: 17 — ABNORMAL HIGH (ref 5–15)
BUN: 8 mg/dL (ref 6–20)
CO2: 19 mmol/L — ABNORMAL LOW (ref 22–32)
Calcium: 8.2 mg/dL — ABNORMAL LOW (ref 8.9–10.3)
Chloride: 95 mmol/L — ABNORMAL LOW (ref 98–111)
Creatinine, Ser: 0.99 mg/dL (ref 0.61–1.24)
GFR, Estimated: >60 mL/min (ref >60)
Glucose, Bld: 81 mg/dL (ref 70–99)
Potassium: 2.8 mmol/L — ABNORMAL LOW (ref 3.5–5.1)
Sodium: 131 mmol/L — ABNORMAL LOW (ref 135–145)

## 2023-06-02 LAB — MAGNESIUM: Magnesium: 1.4 mg/dL — ABNORMAL LOW (ref 1.7–2.4)

## 2023-06-02 LAB — BRAIN NATRIURETIC PEPTIDE: B Natriuretic Peptide: 57.3 pg/mL (ref 0.0–100.0)

## 2023-06-02 MED ORDER — POTASSIUM CHLORIDE 10 MEQ/100ML IV SOLN
10.0000 meq | Freq: Once | INTRAVENOUS | Status: AC
  Administered 2023-06-02: 10 meq via INTRAVENOUS
  Filled 2023-06-02: qty 100

## 2023-06-02 MED ORDER — METHYLPREDNISOLONE SODIUM SUCC 125 MG IJ SOLR
125.0000 mg | Freq: Once | INTRAMUSCULAR | Status: AC
  Administered 2023-06-02: 125 mg via INTRAVENOUS
  Filled 2023-06-02: qty 2

## 2023-06-02 MED ORDER — IPRATROPIUM-ALBUTEROL 0.5-2.5 (3) MG/3ML IN SOLN
3.0000 mL | Freq: Once | RESPIRATORY_TRACT | Status: AC
  Administered 2023-06-02: 3 mL via RESPIRATORY_TRACT
  Filled 2023-06-02: qty 3

## 2023-06-02 MED ORDER — POTASSIUM CHLORIDE CRYS ER 20 MEQ PO TBCR
40.0000 meq | EXTENDED_RELEASE_TABLET | Freq: Once | ORAL | Status: AC
  Administered 2023-06-02: 40 meq via ORAL
  Filled 2023-06-02: qty 2

## 2023-06-02 MED ORDER — ONDANSETRON HCL 4 MG/2ML IJ SOLN: 4.0000 mg | Freq: Once | INTRAMUSCULAR | Status: AC

## 2023-06-02 MED ORDER — ONDANSETRON HCL 4 MG/2ML IJ SOLN
INTRAMUSCULAR | Status: AC
Start: 2023-06-02 — End: 2023-06-02
  Administered 2023-06-02: 4 mg via INTRAVENOUS
  Filled 2023-06-02: qty 2

## 2023-06-02 MED ORDER — ACETAMINOPHEN 500 MG PO TABS
1000.0000 mg | ORAL_TABLET | Freq: Once | ORAL | Status: AC
  Administered 2023-06-02: 1000 mg via ORAL
  Filled 2023-06-02: qty 2

## 2023-06-02 NOTE — ED Notes (Signed)
 Pt pulse stayed in range of 115-122 while ambulating. Oxygen stayed between 91%-93%. Pt stated he felt out of breath while ambulating. Pt appeared visibility uncomfortable.

## 2023-06-02 NOTE — ED Triage Notes (Signed)
 BIB EMS from home for SOB. Pt was diagnosed with flu last week, started feeling better and then got sick again on Saturday. Pt has COPD, pt is tachypneic and has labored breathing, O2 is 96% on RA.

## 2023-06-02 NOTE — ED Provider Notes (Signed)
 [place] EMERGENCY DEPARTMENT AT [facility] Provider Note   CSN: [id number] Arrival date & time: 06/02/23  4098     History  Chief Complaint  Patient presents with   Shortness of Breath    Devin [name] is a 43 y.o. male with a history of asthma, COPD, bipolar presents with shortness of breath.  Patient was diagnosed with flu last week.  States he was feeling better and then Saturday started to feel worse.  Endorses vomiting and diarrhea.  States his chest feels heavy.  Has no history of cardiac issues or blood clots.  Reports that he recently ran out of his inhaler.   Shortness of Breath      Home Medications Prior to Admission medications   Medication Sig Start Date End Date Taking? Authorizing Provider  albuterol  (VENTOLIN  HFA) 108 (90 Base) MCG/ACT inhaler Inhale 1-2 puffs into the lungs every 6 (six) hours as needed for wheezing or shortness of breath. Patient not taking: Reported on 06/02/2023 03/19/21   Joldersma, Logan, PA-C  benzonatate  (TESSALON ) 100 MG capsule Take 1 capsule (100 mg total) by mouth every 8 (eight) hours. Patient not taking: Reported on 06/02/2023 05/21/23   Barrett, Jamie N, PA-C  HYDROcodone -acetaminophen  (NORCO/VICODIN) 5-325 MG tablet Take 1 tablet by mouth 3 (three) times daily as needed. Patient not taking: Reported on 06/02/2023 03/23/23   [provider]  ondansetron  (ZOFRAN -ODT) 4 MG disintegrating tablet Take 1 tablet (4 mg total) by mouth every 8 (eight) hours as needed for nausea or vomiting. Patient not taking: Reported on 06/02/2023 05/21/23   Barrett, Kandace Organ, PA-C  predniSONE  (DELTASONE ) 20 MG tablet Take 2 tablets (40 mg total) by mouth daily. Patient not taking: Reported on 06/02/2023 04/28/23   Adel Aden, PA-C      Allergies    Augmentin [amoxicillin -pot clavulanate] and Other    Review of Systems   Review of Systems  Respiratory:  Positive for shortness of breath.     Physical Exam Updated Vital  Signs BP (!) 149/101   Pulse (!) 108   Temp 99 F (37.2 C) (Oral)   Resp (!) 22   Ht 5\' 10"  (1.778 m)   Wt 89 kg   SpO2 94%   BMI 28.15 kg/m  Physical Exam Vitals and nursing note reviewed.  Constitutional:      General: He is in acute distress.     Appearance: He is well-developed.  HENT:     Head: Normocephalic and atraumatic.  Eyes:     Conjunctiva/sclera: Conjunctivae normal.  Cardiovascular:     Rate and Rhythm: Regular rhythm. Tachycardia present.     Heart sounds: No murmur heard. Pulmonary:     Breath sounds: Wheezing and rhonchi present.     Comments: Increased respiratory effort, satting 97% on room air Abdominal:     Palpations: Abdomen is soft.     Tenderness: There is no abdominal tenderness.  Musculoskeletal:        General: No swelling.     Cervical back: Neck supple.  Skin:    General: Skin is warm and dry.     Capillary Refill: Capillary refill takes less than 2 seconds.  Neurological:     Mental Status: He is alert.  Psychiatric:        Mood and Affect: Mood normal.     ED Results / Procedures / Treatments   Labs (all labs ordered are listed, but only abnormal results are displayed) Labs Reviewed  BASIC  METABOLIC PANEL - Abnormal; Notable for the following components:      Result Value   Sodium 131 (*)    Potassium 2.8 (*)    Chloride 95 (*)    CO2 19 (*)    Calcium 8.2 (*)    Anion gap 17 (*)    All other components within normal limits  CBC WITH DIFFERENTIAL/PLATELET - Abnormal; Notable for the following components:   WBC 11.8 (*)    Neutro Abs 9.0 (*)    Monocytes Absolute 1.2 (*)    All other components within normal limits  MAGNESIUM  - Abnormal; Notable for the following components:   Magnesium  1.4 (*)    All other components within normal limits  RESP PANEL BY RT-PCR (RSV, FLU A&B, COVID)  RVPGX2  BRAIN NATRIURETIC PEPTIDE  TROPONIN I (HIGH SENSITIVITY)  TROPONIN I (HIGH SENSITIVITY)    EKG None  Radiology DG Chest 2  View Result Date: 06/02/2023 CLINICAL DATA:  Shortness of breath.  Recent influenza. EXAM: CHEST - 2 VIEW COMPARISON:  05/21/2023 FINDINGS: Heart and mediastinal shadows are normal. Mild central bronchial thickening but no infiltrate, collapse or effusion. No abnormal bone finding. IMPRESSION: Mild central bronchial thickening. No infiltrate, collapse or effusion. Electronically Signed   By: Bettylou Brunner M.D.   On: 06/02/2023 20:50    Procedures Procedures    Medications Ordered in ED Medications  magnesium  oxide (MAG-OX) tablet 400 mg (has no administration in time range)  ipratropium-albuterol  (DUONEB) 0.5-2.5 (3) MG/3ML nebulizer solution 3 mL (3 mLs Nebulization Given 06/02/23 2002)  methylPREDNISolone  sodium succinate (SOLU-MEDROL ) 125 mg/2 mL injection 125 mg (125 mg Intravenous Given 06/02/23 2001)  ipratropium-albuterol  (DUONEB) 0.5-2.5 (3) MG/3ML nebulizer solution 3 mL (3 mLs Nebulization Given 06/02/23 2017)  acetaminophen  (TYLENOL ) tablet 1,000 mg (1,000 mg Oral Given 06/02/23 2017)  ondansetron  (ZOFRAN ) injection 4 mg (4 mg Intravenous Given 06/02/23 2037)  ipratropium-albuterol  (DUONEB) 0.5-2.5 (3) MG/3ML nebulizer solution 3 mL (3 mLs Nebulization Given 06/02/23 2106)  potassium chloride  10 mEq in 100 mL IVPB (0 mEq Intravenous Stopped 06/02/23 2224)  potassium chloride  SA (KLOR-CON  M) CR tablet 40 mEq (40 mEq Oral Given 06/02/23 2119)    ED Course/ Medical Decision Making/ A&P                                 Medical Decision Making Amount and/or Complexity of Data Reviewed Labs: ordered. Radiology: ordered.  Risk OTC drugs. Prescription drug management. Decision regarding hospitalization.   This patient presents to the ED with chief complaint(s) of SOB .  The complaint involves an extensive differential diagnosis and also carries with it a high risk of complications and morbidity.   pertinent past medical history as listed in HPI  The differential diagnosis includes   ACS, PE, aortic dissection, pneumonia, pneumothorax, myocarditis, pericarditis, musculoskeletal, GERD, esophageal dissection The initial plan is to  Will start with basic labs, EKG, chest x-ray Additional history obtained: Records reviewed previous admission documents and Care Everywhere/External Records  Initial Assessment:   Patient presents tachypneic and tachycardic, normotensive with complaining of shortness of breath and chest heaviness.  Recently tested positive for the flu.  He had temporary improvement before worsening over the past 2 days.  He does have a history of asthma and COPD however he has since ran out of his inhalers.   Independent ECG interpretation:  Sinus tachycardia with occasional PVC  Independent labs interpretation:  The following  labs were independently interpreted:  CBC with mild leukocytosis of 11.8, potassium of 2.8, elevated anion gap, glucose within normal limits, Mag of 1.4  Independent visualization and interpretation of imaging: I independently visualized the following imaging with scope of interpretation limited to determining acute life threatening conditions related to emergency care: Chest x-ray, which revealed no evidence of cardiopulmonary disease  Treatment and Reassessment: Following first assessment patient given DuoNeb and methylprednisolone  125  Patient given second Duoneb Patient given a third DuoNeb Upon reassessment patient reports persistent shortness of breath.  Discussed admitting patient.  He is agreeable as he has a history of intubation for asthma.  Patient given IV potassium and p.o. potassium and p.o. mag Consultations obtained:   Hospitalist Dr. Carlton Chick, agreed for admission  Disposition:   She will be admitted for hypokalemia and COPD exacerbation.  Social Determinants of Health:   Patient's impaired access to primary care  increases the complexity of managing their presentation  This note was dictated with voice  recognition software.  Despite best efforts at proofreading, errors may have occurred which can change the documentation meaning.          Final Clinical Impression(s) / ED Diagnoses Final diagnoses:  COPD exacerbation (HCC)  Hypokalemia  Hypomagnesemia    Rx / DC Orders ED Discharge Orders     None         Stanton Earthly 06/03/23 0040    Cheyenne Cotta, MD 06/04/23 1308

## 2023-06-02 NOTE — H&P (Addendum)
History and Physical    Patient: Devin [name] UJW:[id number] DOB: [birth year] DOA: 06/02/2023 DOS: the patient was seen and examined on 06/02/2023 PCP: Patient, No Pcp Per  Patient coming from: Home  Chief Complaint:  Chief Complaint  Patient presents with   Shortness of Breath   HPI: Devin [name] is a 43 y.o. male with medical history significant of Asthma/COPD, ADHD, Bipolar 1 disorder, GERD who was diagnosed with influenza a on July 29.  He was seen in the hospital and evaluated and treated with Tamiflu.  Patient was discharged home but returned tonight with worsening shortness of breath.  Patient also has some nausea with vomiting.  Denied any hemoptysis.  She has taken his medications.  In the ER patient was found to be tachypneic.  He is not requiring oxygen at rest but with activity.  He has been on albuterol at home.  Also noted to have significant hypokalemia.  His chest x-ray still shows no pneumonia.  Mainly central bronchial thickening.  Patient appears to have reactive airway disease.  More than likely a reaction with flaring of of his asthma.  Patient will be admitted for further management.  He denied any chest pain at the moment.  His cough is nonproductive.  Review of Systems: As mentioned in the history of present illness. All other systems reviewed and are negative. Past Medical History:  Diagnosis Date   ADHD (attention deficit hyperactivity disorder)    Asthma    Bipolar 1 disorder (HCC)    GERD (gastroesophageal reflux disease)    Transaminitis 07/17/2015   Past Surgical History:  Procedure Laterality Date   TYMPANOPLASTY Left 1997   Social History:  reports that he has been smoking cigarettes. He has never used smokeless tobacco. He reports current alcohol use. He reports that he does not currently use drugs after having used the following drugs: Marijuana.  Allergies  Allergen Reactions   Augmentin [Amoxicillin-Pot Clavulanate] Hives    Tolerates Amoxicillin -  Thuy 09/29/12   Other Nausea And Vomiting    coconut    Family History  Problem Relation Age of Onset   Coronary artery disease Father    Heart attack Father    Heart disease Father    Heart failure Mother    Hyperlipidemia Mother    Hypertension Mother    Migraines Mother    COPD Mother    Diabetes Maternal Aunt    Asthma Maternal Grandfather    Asthma Paternal Grandfather     Prior to Admission medications   Medication Sig Start Date End Date Taking? Authorizing Provider  albuterol (VENTOLIN HFA) 108 (90 Base) MCG/ACT inhaler Inhale 1-2 puffs into the lungs every 6 (six) hours as needed for wheezing or shortness of breath. Patient not taking: Reported on 06/02/2023 03/19/21   Placido Sou, PA-C  benzonatate (TESSALON) 100 MG capsule Take 1 capsule (100 mg total) by mouth every 8 (eight) hours. Patient not taking: Reported on 06/02/2023 05/21/23   Barrett, Horald Chestnut, PA-C  HYDROcodone-acetaminophen (NORCO/VICODIN) 5-325 MG tablet Take 1 tablet by mouth 3 (three) times daily as needed. Patient not taking: Reported on 06/02/2023 03/23/23   [provider]  ondansetron (ZOFRAN-ODT) 4 MG disintegrating tablet Take 1 tablet (4 mg total) by mouth every 8 (eight) hours as needed for nausea or vomiting. Patient not taking: Reported on 06/02/2023 05/21/23   Barrett, Horald Chestnut, PA-C  predniSONE (DELTASONE) 20 MG tablet Take 2 tablets (40 mg total) by mouth daily. Patient not taking:  Reported on 06/02/2023 04/28/23   Al Decant, PA-C    Physical Exam: Vitals:   06/02/23 1930 06/02/23 1931 06/02/23 2003  BP: 121/88  131/66  Pulse: (!) 134  (!) 115  Resp: (!) 30  19  Temp: 99.6 F (37.6 C)    TempSrc: Oral    SpO2: 97%  99%  Weight:  89 kg   Height:  5\' 10"  (1.778 m)    Constitutional: Acutely ill looking NAD, calm, comfortable Eyes: PERRL, lids and conjunctivae normal ENMT: Mucous membranes are moist. Posterior pharynx clear of any exudate or lesions.Normal dentition.   Neck: normal, supple, no masses, no thyromegaly Respiratory: Decreased air entry bilaterally with mild expiratory wheezing no crackles. Normal respiratory effort. No accessory muscle use.  Cardiovascular: Sinus tachycardia, no murmurs / rubs / gallops. No extremity edema. 2+ pedal pulses. No carotid bruits.  Abdomen: no tenderness, no masses palpated. No hepatosplenomegaly. Bowel sounds positive.  Musculoskeletal: Good range of motion, no joint swelling or tenderness, Skin: no rashes, lesions, ulcers. No induration Neurologic: CN 2-12 grossly intact. Sensation intact, DTR normal. Strength 5/5 in all 4.  Psychiatric: Normal judgment and insight. Alert and oriented x 3.  Anxious mood  Data Reviewed:  Temperature 99.6, blood pressure 149/101, pulse 134, bicarb 11.8, sodium 131 potassium 2.8 chloride 95 CO2 19 and calcium 8.2, magnesium is 1.4 and white count 11.8.  Acute viral screen is negative.  Assessment and Plan:  #1 hypokalemia: Most likely secondary to recent viral illness with symptoms.  No active nausea with vomiting.  Also secondary to hypomagnesemia.  Replete potassium and magnesium.  #2 hypomagnesemia: Replete magnesium.  It is 1.4 now.  #3 acute exacerbation of COPD/Asthma: Continue steroids nebulizer treatment and antibiotics.  #4 sinus tachycardia: Secondary to COPD exacerbation and  #5 major depressive disorder: No home regimen  #6 leukocytosis: Monitor white count.   Advance Care Planning:   Code Status: Full Code   Consults: None  Family Communication: Family at bedside  Severity of Illness: The appropriate patient status for this patient is OBSERVATION. Observation status is judged to be reasonable and necessary in order to provide the required intensity of service to ensure the patient's safety. The patient's presenting symptoms, physical exam findings, and initial radiographic and laboratory data in the context of their medical condition is felt to place them at  decreased risk for further clinical deterioration. Furthermore, it is anticipated that the patient will be medically stable for discharge from the hospital within 2 midnights of admission.   AuthorLonia Blood, MD 06/02/2023 9:58 PM  For on call review [email or web address].

## 2023-06-03 DIAGNOSIS — F319 Bipolar disorder, unspecified: Secondary | ICD-10-CM | POA: Diagnosis present

## 2023-06-03 DIAGNOSIS — Z8619 Personal history of other infectious and parasitic diseases: Secondary | ICD-10-CM | POA: Diagnosis not present

## 2023-06-03 DIAGNOSIS — Z833 Family history of diabetes mellitus: Secondary | ICD-10-CM | POA: Diagnosis not present

## 2023-06-03 DIAGNOSIS — Z87891 Personal history of nicotine dependence: Secondary | ICD-10-CM | POA: Diagnosis not present

## 2023-06-03 DIAGNOSIS — I493 Ventricular premature depolarization: Secondary | ICD-10-CM | POA: Diagnosis present

## 2023-06-03 DIAGNOSIS — Z8249 Family history of ischemic heart disease and other diseases of the circulatory system: Secondary | ICD-10-CM | POA: Diagnosis not present

## 2023-06-03 DIAGNOSIS — E8809 Other disorders of plasma-protein metabolism, not elsewhere classified: Secondary | ICD-10-CM | POA: Diagnosis present

## 2023-06-03 DIAGNOSIS — E876 Hypokalemia: Secondary | ICD-10-CM | POA: Diagnosis present

## 2023-06-03 DIAGNOSIS — Z825 Family history of asthma and other chronic lower respiratory diseases: Secondary | ICD-10-CM | POA: Diagnosis not present

## 2023-06-03 DIAGNOSIS — F909 Attention-deficit hyperactivity disorder, unspecified type: Secondary | ICD-10-CM | POA: Diagnosis present

## 2023-06-03 DIAGNOSIS — E871 Hypo-osmolality and hyponatremia: Secondary | ICD-10-CM | POA: Diagnosis present

## 2023-06-03 DIAGNOSIS — E872 Acidosis, unspecified: Secondary | ICD-10-CM | POA: Diagnosis present

## 2023-06-03 DIAGNOSIS — R0603 Acute respiratory distress: Secondary | ICD-10-CM | POA: Diagnosis present

## 2023-06-03 DIAGNOSIS — Z83438 Family history of other disorder of lipoprotein metabolism and other lipidemia: Secondary | ICD-10-CM | POA: Diagnosis not present

## 2023-06-03 DIAGNOSIS — K219 Gastro-esophageal reflux disease without esophagitis: Secondary | ICD-10-CM | POA: Diagnosis present

## 2023-06-03 DIAGNOSIS — R7401 Elevation of levels of liver transaminase levels: Secondary | ICD-10-CM | POA: Diagnosis present

## 2023-06-03 DIAGNOSIS — Z79899 Other long term (current) drug therapy: Secondary | ICD-10-CM | POA: Diagnosis not present

## 2023-06-03 DIAGNOSIS — Z880 Allergy status to penicillin: Secondary | ICD-10-CM | POA: Diagnosis not present

## 2023-06-03 DIAGNOSIS — Z1152 Encounter for screening for COVID-19: Secondary | ICD-10-CM | POA: Diagnosis not present

## 2023-06-03 DIAGNOSIS — F419 Anxiety disorder, unspecified: Secondary | ICD-10-CM | POA: Diagnosis present

## 2023-06-03 DIAGNOSIS — J441 Chronic obstructive pulmonary disease with (acute) exacerbation: Secondary | ICD-10-CM | POA: Diagnosis present

## 2023-06-03 DIAGNOSIS — J45901 Unspecified asthma with (acute) exacerbation: Secondary | ICD-10-CM | POA: Diagnosis present

## 2023-06-03 LAB — COMPREHENSIVE METABOLIC PANEL
ALT: 67 U/L — ABNORMAL HIGH (ref 0–44)
AST: 148 U/L — ABNORMAL HIGH (ref 15–41)
Albumin: 2.8 g/dL — ABNORMAL LOW (ref 3.5–5.0)
Alkaline Phosphatase: 80 U/L (ref 38–126)
Anion gap: 11 (ref 5–15)
BUN: 9 mg/dL (ref 6–20)
CO2: 18 mmol/L — ABNORMAL LOW (ref 22–32)
Calcium: 7.7 mg/dL — ABNORMAL LOW (ref 8.9–10.3)
Chloride: 103 mmol/L (ref 98–111)
Creatinine, Ser: 0.78 mg/dL (ref 0.61–1.24)
GFR, Estimated: >60 mL/min (ref >60)
Glucose, Bld: 138 mg/dL — ABNORMAL HIGH (ref 70–99)
Potassium: 3.6 mmol/L (ref 3.5–5.1)
Sodium: 132 mmol/L — ABNORMAL LOW (ref 135–145)
Total Bilirubin: 0.8 mg/dL (ref 0.0–1.2)
Total Protein: 6.5 g/dL (ref 6.5–8.1)

## 2023-06-03 LAB — CBC
HCT: 42.4 % (ref 39.0–52.0)
Hemoglobin: 14.2 g/dL (ref 13.0–17.0)
MCH: 30 pg (ref 26.0–34.0)
MCHC: 33.5 g/dL (ref 30.0–36.0)
MCV: 89.5 fL (ref 80.0–100.0)
Platelets: 276 10*3/uL (ref 150–400)
RBC: 4.74 MIL/uL (ref 4.22–5.81)
RDW: 14.2 % (ref 11.5–15.5)
WBC: 8.5 10*3/uL (ref 4.0–10.5)
nRBC: 0 % (ref 0.0–0.2)

## 2023-06-03 LAB — CREATININE, SERUM
Creatinine, Ser: 0.76 mg/dL (ref 0.61–1.24)
GFR, Estimated: >60 mL/min (ref >60)

## 2023-06-03 LAB — HIV ANTIBODY (ROUTINE TESTING W REFLEX): HIV Screen 4th Generation wRfx: NONREACTIVE

## 2023-06-03 MED ORDER — ENOXAPARIN SODIUM 40 MG/0.4ML IJ SOSY
40.0000 mg | PREFILLED_SYRINGE | INTRAMUSCULAR | Status: DC
Start: 2023-06-03 — End: 2023-06-05
  Administered 2023-06-03 – 2023-06-05 (×3): 40 mg via SUBCUTANEOUS
  Filled 2023-06-03 (×3): qty 0.4

## 2023-06-03 MED ORDER — GUAIFENESIN-DM 100-10 MG/5ML PO SYRP
10.0000 mL | ORAL_SOLUTION | ORAL | Status: DC | PRN
  Administered 2023-06-04 – 2023-06-05 (×4): 10 mL via ORAL
  Filled 2023-06-03 (×4): qty 10

## 2023-06-03 MED ORDER — ACETAMINOPHEN 325 MG PO TABS
650.0000 mg | ORAL_TABLET | Freq: Four times a day (QID) | ORAL | Status: DC | PRN
Start: 2023-06-03 — End: 2023-06-05
  Administered 2023-06-03 – 2023-06-05 (×2): 650 mg via ORAL
  Filled 2023-06-03 (×2): qty 2

## 2023-06-03 MED ORDER — BUTALBITAL-APAP-CAFFEINE 50-325-40 MG PO TABS
1.0000 | ORAL_TABLET | Freq: Once | ORAL | Status: AC
  Administered 2023-06-03: 1 via ORAL
  Filled 2023-06-03: qty 1

## 2023-06-03 MED ORDER — BUDESONIDE 0.25 MG/2ML IN SUSP
0.2500 mg | Freq: Two times a day (BID) | RESPIRATORY_TRACT | Status: DC
  Administered 2023-06-03 – 2023-06-05 (×5): 0.25 mg via RESPIRATORY_TRACT
  Filled 2023-06-03 (×5): qty 2

## 2023-06-03 MED ORDER — TRAMADOL HCL 50 MG PO TABS
50.0000 mg | ORAL_TABLET | Freq: Two times a day (BID) | ORAL | Status: DC | PRN
  Administered 2023-06-03 – 2023-06-04 (×2): 50 mg via ORAL
  Filled 2023-06-03 (×2): qty 1

## 2023-06-03 MED ORDER — GUAIFENESIN ER 600 MG PO TB12
600.0000 mg | ORAL_TABLET | Freq: Two times a day (BID) | ORAL | Status: DC
  Administered 2023-06-03 – 2023-06-05 (×4): 600 mg via ORAL
  Filled 2023-06-03 (×4): qty 1

## 2023-06-03 MED ORDER — IPRATROPIUM-ALBUTEROL 0.5-2.5 (3) MG/3ML IN SOLN
3.0000 mL | Freq: Four times a day (QID) | RESPIRATORY_TRACT | Status: DC
Start: 2023-06-03 — End: 2023-06-03
  Administered 2023-06-03: 3 mL via RESPIRATORY_TRACT
  Filled 2023-06-03: qty 3

## 2023-06-03 MED ORDER — AZITHROMYCIN 250 MG PO TABS
500.0000 mg | ORAL_TABLET | Freq: Every day | ORAL | Status: DC
  Administered 2023-06-04 – 2023-06-05 (×2): 500 mg via ORAL
  Filled 2023-06-03 (×2): qty 2

## 2023-06-03 MED ORDER — REVEFENACIN 175 MCG/3ML IN SOLN
175.0000 ug | Freq: Every day | RESPIRATORY_TRACT | Status: DC
  Administered 2023-06-03 – 2023-06-05 (×3): 175 ug via RESPIRATORY_TRACT
  Filled 2023-06-03 (×3): qty 3

## 2023-06-03 MED ORDER — POTASSIUM CHLORIDE IN NACL 40-0.9 MEQ/L-% IV SOLN
INTRAVENOUS | Status: AC
  Filled 2023-06-03 (×4): qty 1000

## 2023-06-03 MED ORDER — MAGNESIUM SULFATE 2 GM/50ML IV SOLN
2.0000 g | Freq: Once | INTRAVENOUS | Status: AC
  Administered 2023-06-03: 2 g via INTRAVENOUS
  Filled 2023-06-03: qty 50

## 2023-06-03 MED ORDER — PREDNISONE 20 MG PO TABS: 40.0000 mg | ORAL_TABLET | Freq: Every day | ORAL | Status: DC

## 2023-06-03 MED ORDER — SODIUM CHLORIDE 0.9 % IV SOLN
500.0000 mg | INTRAVENOUS | Status: AC
  Administered 2023-06-03: 500 mg via INTRAVENOUS
  Filled 2023-06-03: qty 5

## 2023-06-03 MED ORDER — MELATONIN 5 MG PO TABS
5.0000 mg | ORAL_TABLET | Freq: Once | ORAL | Status: AC
  Administered 2023-06-03: 5 mg via ORAL
  Filled 2023-06-03: qty 1

## 2023-06-03 MED ORDER — PREDNISONE 20 MG PO TABS
40.0000 mg | ORAL_TABLET | Freq: Every day | ORAL | Status: DC
  Administered 2023-06-04: 40 mg via ORAL
  Filled 2023-06-03: qty 2

## 2023-06-03 MED ORDER — MAGNESIUM OXIDE -MG SUPPLEMENT 400 (240 MG) MG PO TABS
400.0000 mg | ORAL_TABLET | Freq: Once | ORAL | Status: AC
  Administered 2023-06-03: 400 mg via ORAL
  Filled 2023-06-03: qty 1

## 2023-06-03 MED ORDER — ARFORMOTEROL TARTRATE 15 MCG/2ML IN NEBU
15.0000 ug | INHALATION_SOLUTION | Freq: Two times a day (BID) | RESPIRATORY_TRACT | Status: DC
  Administered 2023-06-03 – 2023-06-05 (×5): 15 ug via RESPIRATORY_TRACT
  Filled 2023-06-03 (×5): qty 2

## 2023-06-03 MED ORDER — METHYLPREDNISOLONE SODIUM SUCC 40 MG IJ SOLR
40.0000 mg | Freq: Two times a day (BID) | INTRAMUSCULAR | Status: AC
Start: 2023-06-03 — End: 2023-06-03
  Administered 2023-06-03 (×2): 40 mg via INTRAVENOUS
  Filled 2023-06-03 (×2): qty 1

## 2023-06-03 MED ORDER — ALBUTEROL SULFATE (2.5 MG/3ML) 0.083% IN NEBU
2.5000 mg | INHALATION_SOLUTION | RESPIRATORY_TRACT | Status: DC | PRN
  Administered 2023-06-03 – 2023-06-04 (×4): 2.5 mg via RESPIRATORY_TRACT
  Filled 2023-06-03 (×4): qty 3

## 2023-06-03 MED ORDER — METHYLPREDNISOLONE SODIUM SUCC 40 MG IJ SOLR
40.0000 mg | Freq: Two times a day (BID) | INTRAMUSCULAR | Status: DC
  Filled 2023-06-03: qty 1

## 2023-06-03 NOTE — Plan of Care (Signed)
  Problem: Education: Goal: Knowledge of General Education information will improve Description: Including pain rating scale, medication(s)/side effects and non-pharmacologic comfort measures Outcome: Progressing   Problem: Health Behavior/Discharge Planning: Goal: Ability to manage health-related needs will improve Outcome: Progressing   Problem: Clinical Measurements: Goal: Will remain free from infection Outcome: Progressing Goal: Diagnostic test results will improve Outcome: Progressing   Problem: Activity: Goal: Risk for activity intolerance will decrease Outcome: Progressing   Problem: Nutrition: Goal: Adequate nutrition will be maintained Outcome: Progressing   Problem: Coping: Goal: Level of anxiety will decrease Outcome: Progressing   Problem: Elimination: Goal: Will not experience complications related to bowel motility Outcome: Progressing Goal: Will not experience complications related to urinary retention Outcome: Progressing   Problem: Pain Managment: Goal: General experience of comfort will improve and/or be controlled Outcome: Progressing   Problem: Safety: Goal: Ability to remain free from injury will improve Outcome: Progressing   Problem: Skin Integrity: Goal: Risk for impaired skin integrity will decrease Outcome: Progressing

## 2023-06-03 NOTE — Plan of Care (Signed)

## 2023-06-03 NOTE — Evaluation (Signed)
Physical Therapy Evaluation Patient Details Name: Devin [name] MRN: [id number] DOB: 01-11-1981 Today's Date: 06/03/2023  History of Present Illness  Pt is 43 yo admitted on 06/02/23 with hypokalemia, acute exacerbation of COPD/asthma with + influenza on 05/21/23.  Pt with hx including asthma, COPD, bipolar disorder  Clinical Impression  Pt admitted with above diagnosis. At baseline, pt is independent and working.  Today, he was able to ambulate 400' safely on RA with sats >95%. He did have some SOB and coughing but VSS and able to tolerate activity.  Pt has been ambulating and performing ADLs independently in room.  No further skilled PT need. Will sign off.       If plan is discharge home, recommend the following:     Can travel by private vehicle        Equipment Recommendations None recommended by PT  Recommendations for Other Services       Functional Status Assessment Patient has not had a recent decline in their functional status     Precautions / Restrictions Precautions Precautions: None      Mobility  Bed Mobility Overal bed mobility: Independent                  Transfers Overall transfer level: Independent                      Ambulation/Gait Ambulation/Gait assistance: Independent Gait Distance (Feet): 400 Feet Assistive device: None Gait Pattern/deviations: WFL(Within Functional Limits) Gait velocity: normal     General Gait Details: Reports has been mobilizing in room on his own. Demonstrated safely with therapy  Stairs            Wheelchair Mobility     Tilt Bed    Modified Rankin (Stroke Patients Only)       Balance Overall balance assessment: Independent                                           Pertinent Vitals/Pain Pain Assessment Pain Assessment: Faces Pain Score: 8  Pain Location: generalized body aches Pain Descriptors / Indicators: Aching Pain Intervention(s): Limited activity within  patient's tolerance, Monitored during session, Premedicated before session, Repositioned    Home Living Family/patient expects to be discharged to:: Private residence Living Arrangements: Spouse/significant other Available Help at Discharge: Family;Available PRN/intermittently Type of Home: Apartment Home Access: Stairs to enter   Entrance Stairs-Number of Steps: 1   Home Layout: One level Home Equipment: None      Prior Function Prior Level of Function : Independent/Modified Independent;Working/employed;Driving                     Extremity/Trunk Assessment   Upper Extremity Assessment Upper Extremity Assessment: Overall WFL for tasks assessed    Lower Extremity Assessment Lower Extremity Assessment: Overall WFL for tasks assessed    Cervical / Trunk Assessment Cervical / Trunk Assessment: Normal  Communication        Cognition Arousal: Alert Behavior During Therapy: WFL for tasks assessed/performed   PT - Cognitive impairments: No apparent impairments                                 Cueing       General Comments General comments (skin integrity, edema, etc.): Pt on RA with  sats >95% during session.  Did have some mild SOB wtih walking and coughing but sats remained stable and ambulated 400'    Exercises Other Exercises Other Exercises: Pt reports using flutter valve frequently and mobilizing in room   Assessment/Plan    PT Assessment Patient does not need any further PT services  PT Problem List         PT Treatment Interventions      PT Goals (Current goals can be found in the Care Plan section)  Acute Rehab PT Goals Patient Stated Goal: return home PT Goal Formulation: All assessment and education complete, DC therapy    Frequency       Co-evaluation               AM-PAC PT "6 Clicks" Mobility  Outcome Measure Help needed turning from your back to your side while in a flat bed without using bedrails?: None Help  needed moving from lying on your back to sitting on the side of a flat bed without using bedrails?: None Help needed moving to and from a bed to a chair (including a wheelchair)?: None Help needed standing up from a chair using your arms (e.g., wheelchair or bedside chair)?: None Help needed to walk in hospital room?: None Help needed climbing 3-5 steps with a railing? : None 6 Click Score: 24    End of Session   Activity Tolerance: Patient tolerated treatment well Patient left: in bed;with call bell/phone within reach Nurse Communication: Mobility status PT Visit Diagnosis: Other abnormalities of gait and mobility (R26.89)    Time: 4098-1191 PT Time Calculation (min) (ACUTE ONLY): 14 min   Charges:   PT Evaluation $PT Eval Low Complexity: 1 Low   PT General Charges $$ ACUTE PT VISIT: 1 Visit         Anise Salvo, PT Acute Rehab Services [facility] [phone]   Rayetta Humphrey 06/03/2023, 5:41 PM

## 2023-06-03 NOTE — Hospital Course (Addendum)
43 year old male with history of asthma COPD bipolar disorder who was diagnosed with influenza on1/29-with negative chest x-ray and discharged home return to the ED 2/10 evening with worsening shortness of breath since Saturday.  He also complained of vomiting and had diarrhea and feeling heaviness in the chest.  He has ran out of inhaler recently. In the ED initially tachycardic in 130s and tachypneic in 30 BP stable, on room air Labs showing hyponatremia 131 hypokalemia 2.8, hco3 19, BNP 57 troponin 16> 15, mag 1.4. flu covid rsv negative. Chest x-ray with mild central bronchial thickening with no infiltrate Patient was placed on azithromycin, Solu-Medrol and admitted for further management. Patient clinically improved had some mild cough at this time feels much improved will discharge on oral prednisone taper home inhaler renewed

## 2023-06-03 NOTE — Progress Notes (Signed)
OT Cancellation Note  Patient Details Name: Devin [name] MRN: [id number] DOB: 08-08-1980   Cancelled Treatment:    Reason Eval/Treat Not Completed: Pain limiting ability to participate Patient reporting increased pain and fatigue this AM declining to participate. OT to continue to follow and check back as schedule will allow.  Rosalio Loud, MS Acute Rehabilitation Department Office# [phone]  06/03/2023, 9:15 AM

## 2023-06-03 NOTE — Progress Notes (Signed)
PROGRESS NOTE KAMAREON SCIANDRA  NWG:[id number] DOB: [birth year] DOA: 06/02/2023 PCP: Patient, No Pcp Per  Brief Narrative/Hospital Course: 43 year old male with history of asthma COPD bipolar disorder who was diagnosed with influenza on1/29-with negative chest x-ray and discharged home return to the ED 2/10 evening with worsening shortness of breath since Saturday.  He also complained of vomiting and had diarrhea and feeling heaviness in the chest.  He has ran out of inhaler recently. In the ED initially tachycardic in 130s and tachypneic in 30 BP stable, on room air Labs showing hyponatremia 131 hypokalemia 2.8, hco3 19, BNP 57 troponin 16> 15, mag 1.4. flu covid rsv negative. Chest x-ray with mild central bronchial thickening with no infiltrate Patient was placed on azithromycin, Solu-Medrol and admitted for further management.      Subjective: Patient reports he feels much better this morning still having cough diarrhea slowing down. Has diffuse wheezing no other new complaints  Assessment and Plan: Principal Problem:   Hypokalemia Active Problems:   ADHD (attention deficit hyperactivity disorder)   Leukocytosis   COPD exacerbation (HCC)   MDD (major depressive disorder), recurrent severe, without psychosis (HCC)   Bipolar II disorder (HCC)   Asthma exacerbation   Respiratory distress with shortness of breath Acute exacerbation of COPD/asthma: Positive influenza on 1/29: Patient diagnosed with influenza 2 weeks ago now with worsening shortness of breath.  Chest x-ray no pneumonia, labs showing mild leukocytosis.Trops neg x 2.  Continue on systemic steroids bronchodilators add Pulmicort Brovana and yupleri and azithromycin. White counts normalized.  He quit smoking a year ago- encouraged.  Transaminitis: Trend labs.  Likely viral etiology.  Mild hyponatremia Hypokalemia Hypomagnesemia Mild metabolic acidosis bicarb 18-19: Received for Mag-Ox yesterday and potassium.  Giving  additional 2 g mag rider,  monitor labs, encourage oral hydration Recent Labs  Lab 06/02/23 1947 06/02/23 2137 06/03/23 0503  K 2.8*  --  3.6  CALCIUM 8.2*  --  7.7*  MG  --  1.4*  --     No results found for: "PTH"   Hypoalbuminemia: Augment nutrition  Major depressive disorder: Cont home meds  DVT prophylaxis: enoxaparin (LOVENOX) injection 40 mg Start: 06/03/23 1000 Code Status:   Code Status: Full Code Family Communication: plan of care discussed with patient at bedside. Patient status is: Remains hospitalized because of severity of illness Level of care: Telemetry   Dispo: The patient is from: Home, independent, he is in between changing his Job.            Anticipated disposition: TBD- likely dc home 1-2 days Objective: Vitals last 24 hrs: Vitals:   06/03/23 0630 06/03/23 0700 06/03/23 0738 06/03/23 0930  BP: (!) 136/93 (!) 138/115  (!) 130/97  Pulse: 80 99  88  Resp: 18   18  Temp:   97.9 F (36.6 C)   TempSrc:   Oral   SpO2: 93% 93%  92%  Weight:      Height:       Weight change:   Physical Examination: General exam: alert awake,at baseline, older than stated age HEENT:Oral mucosa moist, Ear/Nose WNL grossly Respiratory system: Bilaterally diminished breath sounds with bilateral wheezing,no use of accessory muscle Cardiovascular system: S1 & S2 +, No JVD. Gastrointestinal system: Abdomen soft,NT,ND, BS+ Nervous System: Alert, awake, moving all extremities,and following commands. Extremities: LE edema neg,distal peripheral pulses palpable and warm.  Skin: No rashes,no icterus. MSK: Normal muscle bulk,tone, power   Medications reviewed:  Scheduled Meds:  arformoterol  15 mcg  Nebulization BID   [START ON 06/04/2023] azithromycin  500 mg Oral Daily   budesonide (PULMICORT) nebulizer solution  0.25 mg Nebulization BID   enoxaparin (LOVENOX) injection  40 mg Subcutaneous Q24H   methylPREDNISolone (SOLU-MEDROL) injection  40 mg Intravenous Q12H   [START ON  06/04/2023] predniSONE  40 mg Oral Q breakfast   revefenacin  175 mcg Nebulization Daily   Continuous Infusions:  0.9 % NaCl with KCl 40 mEq / L 100 mL/hr at 06/03/23 0358   magnesium sulfate bolus IVPB 2 g (06/03/23 0848)      Diet Order             Diet regular Room service appropriate? Yes; Fluid consistency: Thin  Diet effective now                  Intake/Output Summary (Last 24 hours) at 06/03/2023 0939 Last data filed at 06/03/2023 0242 Gross per 24 hour  Intake 117.62 ml  Output --  Net 117.62 ml   Net IO Since Admission: 117.62 mL [06/03/23 0939]  Wt Readings from Last 3 Encounters:  06/02/23 89 kg  05/21/23 89 kg  04/28/23 88.5 kg     Unresulted Labs (From admission, onward)     Start     Ordered   06/09/23 0500  Creatinine, serum  (enoxaparin (LOVENOX)    CrCl >/= 30 ml/min)  Weekly,   R     Comments: while on enoxaparin therapy    06/03/23 0242   06/03/23 0243  HIV Antibody (routine testing w rflx)  (HIV Antibody (Routine testing w reflex) panel)  Once,   R        06/03/23 0242           Data Reviewed: I have personally reviewed following labs and imaging studies CBC: Recent Labs  Lab 06/02/23 1947 06/03/23 0503  WBC 11.8* 8.5  NEUTROABS 9.0*  --   HGB 15.3 14.2  HCT 44.8 42.4  MCV 89.6 89.5  PLT 317 276   Basic Metabolic Panel:  Recent Labs  Lab 06/02/23 1947 06/02/23 2137 06/03/23 0503  NA 131*  --  132*  K 2.8*  --  3.6  CL 95*  --  103  CO2 19*  --  18*  GLUCOSE 81  --  138*  BUN 8  --  9  CREATININE 0.99  --  0.76  0.78  CALCIUM 8.2*  --  7.7*  MG  --  1.4*  --    GFR: Estimated Creatinine Clearance: 137.8 mL/min (by C-G formula based on SCr of 0.76 mg/dL). Liver Function Tests:  Recent Labs  Lab 06/03/23 0503  AST 148*  ALT 67*  ALKPHOS 80  BILITOT 0.8  PROT 6.5  ALBUMIN 2.8*  Sepsis Labs: No results for input(s): "PROCALCITON", "LATICACIDVEN" in the last 168 hours. Recent Results (from the past 240 hours)   Resp panel by RT-PCR (RSV, Flu A&B, Covid) Anterior Nasal Swab     Status: None   Collection Time: 06/02/23  8:22 PM   Specimen: Anterior Nasal Swab  Result Value Ref Range Status   SARS Coronavirus 2 by RT PCR NEGATIVE NEGATIVE Final    Comment: (NOTE) SARS-CoV-2 target nucleic acids are NOT DETECTED.  The SARS-CoV-2 RNA is generally detectable in upper respiratory specimens during the acute phase of infection. The lowest concentration of SARS-CoV-2 viral copies this assay can detect is 138 copies/mL. A negative result does not preclude SARS-Cov-2 infection and should not be used  as the sole basis for treatment or other patient management decisions. A negative result may occur with  improper specimen collection/handling, submission of specimen other than nasopharyngeal swab, presence of viral mutation(s) within the areas targeted by this assay, and inadequate number of viral copies(<138 copies/mL). A negative result must be combined with clinical observations, patient history, and epidemiological information. The expected result is Negative.  Fact Sheet for Patients:  BloggerCourse.com  Fact Sheet for Healthcare Providers:  SeriousBroker.it  This test is no t yet approved or cleared by the [place] FDA and  has been authorized for detection and/or diagnosis of SARS-CoV-2 by FDA under an Emergency Use Authorization (EUA). This EUA will remain  in effect (meaning this test can be used) for the duration of the COVID-19 declaration under Section 564(b)(1) of the Act, 21 U.S.C.section 360bbb-3(b)(1), unless the authorization is terminated  or revoked sooner.       Influenza A by PCR NEGATIVE NEGATIVE Final   Influenza B by PCR NEGATIVE NEGATIVE Final    Comment: (NOTE) The Xpert Xpress SARS-CoV-2/FLU/RSV plus assay is intended as an aid in the diagnosis of influenza from Nasopharyngeal swab specimens and should not be used  as a sole basis for treatment. Nasal washings and aspirates are unacceptable for Xpert Xpress SARS-CoV-2/FLU/RSV testing.  Fact Sheet for Patients: BloggerCourse.com  Fact Sheet for Healthcare Providers: SeriousBroker.it  This test is not yet approved or cleared by the [place] FDA and has been authorized for detection and/or diagnosis of SARS-CoV-2 by FDA under an Emergency Use Authorization (EUA). This EUA will remain in effect (meaning this test can be used) for the duration of the COVID-19 declaration under Section 564(b)(1) of the Act, 21 U.S.C. section 360bbb-3(b)(1), unless the authorization is terminated or revoked.     Resp Syncytial Virus by PCR NEGATIVE NEGATIVE Final    Comment: (NOTE) Fact Sheet for Patients: BloggerCourse.com  Fact Sheet for Healthcare Providers: SeriousBroker.it  This test is not yet approved or cleared by the [place] FDA and has been authorized for detection and/or diagnosis of SARS-CoV-2 by FDA under an Emergency Use Authorization (EUA). This EUA will remain in effect (meaning this test can be used) for the duration of the COVID-19 declaration under Section 564(b)(1) of the Act, 21 U.S.C. section 360bbb-3(b)(1), unless the authorization is terminated or revoked.  Performed at [facility], [address] [place], [place] [address]   Antimicrobials/Microbiology: Anti-infectives (From admission, onward)    Start     Dose/Rate Route Frequency Ordered Stop   06/04/23 0245  azithromycin (ZITHROMAX) tablet 500 mg       Placed in "Followed by" Linked Group   500 mg Oral Daily 06/03/23 0242 06/08/23 0959   06/03/23 0242  azithromycin (ZITHROMAX) 500 mg in sodium chloride 0.9 % 250 mL IVPB       Placed in "Followed by" Linked Group   500 mg 250 mL/hr over 60 Minutes Intravenous Every 24 hours 06/03/23 0242 06/03/23  0502         Component Value Date/Time   SDES URINE, CLEAN CATCH 11/04/2015 1817   SPECREQUEST NONE 11/04/2015 1817   CULT NO GROWTH 11/04/2015 1817   REPTSTATUS 11/05/2015 FINAL 11/04/2015 1817     Radiology Studies: DG Chest 2 View Result Date: 06/02/2023 CLINICAL DATA:  Shortness of breath.  Recent influenza. EXAM: CHEST - 2 VIEW COMPARISON:  05/21/2023 FINDINGS: Heart and mediastinal shadows are normal. Mild central bronchial thickening but no infiltrate, collapse or effusion. No  abnormal bone finding. IMPRESSION: Mild central bronchial thickening. No infiltrate, collapse or effusion. Electronically Signed   By: Paulina Fusi M.D.   On: 06/02/2023 20:50     LOS: 0 days   Total time spent in review of labs and imaging, patient evaluation, formulation of plan, documentation and communication with family: 35 minutes  Lanae Boast, MD  [facility]  06/03/2023, 9:39 AM

## 2023-06-04 DIAGNOSIS — E876 Hypokalemia: Secondary | ICD-10-CM | POA: Diagnosis not present

## 2023-06-04 MED ORDER — ENSURE ENLIVE PO LIQD
237.0000 mL | Freq: Two times a day (BID) | ORAL | Status: DC
  Administered 2023-06-04: 237 mL via ORAL

## 2023-06-04 MED ORDER — METHYLPREDNISOLONE SODIUM SUCC 40 MG IJ SOLR
40.0000 mg | Freq: Two times a day (BID) | INTRAMUSCULAR | Status: DC
  Administered 2023-06-04 – 2023-06-05 (×2): 40 mg via INTRAVENOUS
  Filled 2023-06-04 (×3): qty 1

## 2023-06-04 MED ORDER — HYDROXYZINE HCL 10 MG PO TABS
10.0000 mg | ORAL_TABLET | Freq: Three times a day (TID) | ORAL | Status: DC | PRN
  Administered 2023-06-04: 10 mg via ORAL
  Filled 2023-06-04: qty 1

## 2023-06-04 MED ORDER — HYDRALAZINE HCL 10 MG PO TABS
10.0000 mg | ORAL_TABLET | Freq: Three times a day (TID) | ORAL | Status: DC | PRN
  Administered 2023-06-04: 10 mg via ORAL
  Filled 2023-06-04: qty 1

## 2023-06-04 MED ORDER — ADULT MULTIVITAMIN W/MINERALS CH
1.0000 | ORAL_TABLET | Freq: Every day | ORAL | Status: DC
  Administered 2023-06-04 – 2023-06-05 (×2): 1 via ORAL
  Filled 2023-06-04 (×2): qty 1

## 2023-06-04 NOTE — Progress Notes (Signed)
Initial Nutrition Assessment  DOCUMENTATION CODES:   Not applicable  INTERVENTION:  Ensure Plus High Protein po BID, each supplement provides 350 kcal and 20 grams of protein. Multivitamin with mineral   NUTRITION DIAGNOSIS:   Increased nutrient needs related to acute illness as evidenced by estimated needs.    GOAL:   Patient will meet greater than or equal to 90% of their needs    MONITOR:   PO intake  REASON FOR ASSESSMENT:   Consult Assessment of nutrition requirement/status  ASSESSMENT:  43 y.o. M, Presented to ED from home, with complaints of SOB, vomiting and diarrhea, feeling heaviness in chest. Admitting with hypokalemia. PMH: GERD, Asthma, Bipolar, COPD, ADHD. Patient alert and orientated.  Stated appetite improving. No more N/V reported.  Reports stool firming up. Stated he works at [employer].  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  No chewing or swallowing concerns Admit weight: 89 kg Weight history;  06/02/23 89 kg  05/21/23 89 kg  04/28/23 88.5 kg  02/13/22 83.9 kg  11/15/21 86.2 kg  08/09/21 85.7 kg  07/16/21 86.2 kg  03/19/21 86.2 kg  11/06/20 87 kg  08/07/20 86.2 kg      Average Meal Intake: No current documentation  Nutritionally Relevant Medications: Scheduled Meds:  azithromycin  500 mg Oral Daily   guaiFENesin  600 mg Oral BID    Labs Reviewed    NUTRITION - FOCUSED PHYSICAL EXAM:  Flowsheet Row Most Recent Value  Orbital Region No depletion  Upper Arm Region No depletion  Thoracic and Lumbar Region No depletion  Buccal Region No depletion  Temple Region No depletion  Clavicle Bone Region No depletion  Clavicle and Acromion Bone Region No depletion  Scapular Bone Region No depletion  Dorsal Hand No depletion  Patellar Region No depletion  Anterior Thigh Region No depletion  Posterior Calf Region No depletion  Edema (RD Assessment) None  Hair Reviewed  Eyes Reviewed  Mouth  Reviewed  [rotting teeth,]  Skin Reviewed  Nails Reviewed       Diet Order:   Diet Order             Diet regular Room service appropriate? Yes; Fluid consistency: Thin  Diet effective now                   EDUCATION NEEDS:   Education needs have been addressed  Skin:  Skin Assessment: Reviewed RN Assessment  Last BM:  06/03/2023  Height:   Ht Readings from Last 1 Encounters:  06/02/23 5\' 10"  (1.778 m)    Weight:   Wt Readings from Last 1 Encounters:  06/02/23 89 kg    Ideal Body Weight:     BMI:  Body mass index is 28.15 kg/m.  Estimated Nutritional Needs:   Kcal:  2300-2600 kcal  Protein:  100-125 g  Fluid:  32ml/kcal    Jamelle Haring RDN, LDN Clinical Dietitian   If unable to reach, please contact "RD Inpatient" secure chat group between 8 am-4 pm daily"

## 2023-06-04 NOTE — Progress Notes (Signed)
   06/04/23 0943  TOC Brief Assessment  Insurance and Status Reviewed  Patient has primary care physician Yes  Home environment has been reviewed home w/ spouse  Prior level of function: independent  Prior/Current Home Services No current home services  Social Drivers of Health Review SDOH reviewed no interventions necessary  Readmission risk has been reviewed Yes  Transition of care needs no transition of care needs at this time

## 2023-06-04 NOTE — Progress Notes (Signed)
OT Cancellation Note  Patient Details Name: Devin [name] MRN: [id number] DOB: [birth year]   Cancelled Treatment:    Reason Eval/Treat Not Completed: OT screened, no needs identified, will sign off Per PT, patient is independent at this time. No OT needs identified. OT to sign off.  Rosalio Loud, MS Acute Rehabilitation Department Office# [phone]  06/04/2023, 6:41 AM

## 2023-06-04 NOTE — Plan of Care (Signed)

## 2023-06-04 NOTE — Progress Notes (Signed)
PROGRESS NOTE Devin [name]  QIO:[id number] DOB: [birth year] DOA: 06/02/2023 PCP: Patient, No Pcp Per  Brief Narrative/Hospital Course: 43 year old male with history of asthma COPD bipolar disorder who was diagnosed with influenza on1/29-with negative chest x-ray and discharged home return to the ED 2/10 evening with worsening shortness of breath since Saturday.  He also complained of vomiting and had diarrhea and feeling heaviness in the chest.  He has ran out of inhaler recently. In the ED initially tachycardic in 130s and tachypneic in 30 BP stable, on room air Labs showing hyponatremia 131 hypokalemia 2.8, hco3 19, BNP 57 troponin 16> 15, mag 1.4. flu covid rsv negative. Chest x-ray with mild central bronchial thickening with no infiltrate Patient was placed on azithromycin, Solu-Medrol and admitted for further management.      Subjective: Patient still in persistent cough and wheezing and anxiety episodes.   Resting comfortably on room air. Assessment and Plan: Principal Problem:   Hypokalemia Active Problems:   ADHD (attention deficit hyperactivity disorder)   Leukocytosis   COPD exacerbation (HCC)   MDD (major depressive disorder), recurrent severe, without psychosis (HCC)   Bipolar II disorder (HCC)   Asthma exacerbation   Respiratory distress with shortness of breath Acute exacerbation of COPD/asthma: Positive influenza on 1/29: Patient diagnosed with influenza 2 weeks ago now with worsening shortness of breath.  Chest x-ray no pneumonia, labs showing mild leukocytosis.Trops neg x 2.  Patient has ongoing shortness of breath cough wheezing  Will change to IV Solu-Medrol, continue on current treatment plan will bronchodilators add Pulmicort Brovana and yupleri and azithromycin. White counts normalized.  He quit smoking a year ago- encouraged.  Transaminitis: Trend labs.  Likely viral etiology.  Mild hyponatremia Hypokalemia Hypomagnesemia Mild metabolic acidosis bicarb  18-19: Replaced.  Monitor intermittently.   Recent Labs  Lab 06/02/23 1947 06/02/23 2137 06/03/23 0503  K 2.8*  --  3.6  CALCIUM 8.2*  --  7.7*  MG  --  1.4*  --     No results found for: "PTH"   Hypoalbuminemia: Augment nutrition  Major depressive disorder: Cont home meds  DVT prophylaxis: enoxaparin (LOVENOX) injection 40 mg Start: 06/03/23 1000 Code Status:   Code Status: Full Code Family Communication: plan of care discussed with patient at bedside. Patient status is: Remains hospitalized because of severity of illness Level of care: Telemetry   Dispo: The patient is from: Home, independent, he is in between changing his Job.            Anticipated disposition: TBD- likely home tomorrow  Objective: Vitals last 24 hrs: Vitals:   06/04/23 0534 06/04/23 0935 06/04/23 0938 06/04/23 0943  BP:      Pulse: 91  83   Resp: (!) 22     Temp:      TempSrc:      SpO2: 96% 98% 99% 100%  Weight:      Height:       Weight change:   Physical Examination: General exam: alert awake, anxious, oriented at baseline, older than stated age HEENT:Oral mucosa moist, Ear/Nose WNL grossly Respiratory system: Bilaterally bilateral wheezing +, no use of accessory muscle Cardiovascular system: S1 & S2 +, No JVD. Gastrointestinal system: Abdomen soft,NT,ND, BS+ Nervous System: Alert, awake, moving all extremities,and following commands. Extremities: LE edema neg,distal peripheral pulses palpable and warm.  Skin: No rashes,no icterus. MSK: Normal muscle bulk,tone, power   Medications reviewed:  Scheduled Meds:  arformoterol  15 mcg Nebulization BID   azithromycin  500  mg Oral Daily   budesonide (PULMICORT) nebulizer solution  0.25 mg Nebulization BID   enoxaparin (LOVENOX) injection  40 mg Subcutaneous Q24H   guaiFENesin  600 mg Oral BID   predniSONE  40 mg Oral Q breakfast   revefenacin  175 mcg Nebulization Daily   Continuous Infusions:      Diet Order             Diet  regular Room service appropriate? Yes; Fluid consistency: Thin  Diet effective now                  Intake/Output Summary (Last 24 hours) at 06/04/2023 1153 Last data filed at 06/04/2023 0200 Gross per 24 hour  Intake 2803.24 ml  Output --  Net 2803.24 ml   Net IO Since Admission: 2,920.86 mL [06/04/23 1153]  Wt Readings from Last 3 Encounters:  06/02/23 89 kg  05/21/23 89 kg  04/28/23 88.5 kg     Unresulted Labs (From admission, onward)     Start     Ordered   06/09/23 0500  Creatinine, serum  (enoxaparin (LOVENOX)    CrCl >/= 30 ml/min)  Weekly,   R     Comments: while on enoxaparin therapy    06/03/23 0242           Data Reviewed: I have personally reviewed following labs and imaging studies CBC: Recent Labs  Lab 06/02/23 1947 06/03/23 0503  WBC 11.8* 8.5  NEUTROABS 9.0*  --   HGB 15.3 14.2  HCT 44.8 42.4  MCV 89.6 89.5  PLT 317 276   Basic Metabolic Panel:  Recent Labs  Lab 06/02/23 1947 06/02/23 2137 06/03/23 0503  NA 131*  --  132*  K 2.8*  --  3.6  CL 95*  --  103  CO2 19*  --  18*  GLUCOSE 81  --  138*  BUN 8  --  9  CREATININE 0.99  --  0.76  0.78  CALCIUM 8.2*  --  7.7*  MG  --  1.4*  --    GFR: Estimated Creatinine Clearance: 137.8 mL/min (by C-G formula based on SCr of 0.76 mg/dL). Liver Function Tests:  Recent Labs  Lab 06/03/23 0503  AST 148*  ALT 67*  ALKPHOS 80  BILITOT 0.8  PROT 6.5  ALBUMIN 2.8*  Sepsis Labs: No results for input(s): "PROCALCITON", "LATICACIDVEN" in the last 168 hours. Recent Results (from the past 240 hours)  Resp panel by RT-PCR (RSV, Flu A&B, Covid) Anterior Nasal Swab     Status: None   Collection Time: 06/02/23  8:22 PM   Specimen: Anterior Nasal Swab  Result Value Ref Range Status   SARS Coronavirus 2 by RT PCR NEGATIVE NEGATIVE Final    Comment: (NOTE) SARS-CoV-2 target nucleic acids are NOT DETECTED.  The SARS-CoV-2 RNA is generally detectable in upper respiratory specimens during the acute  phase of infection. The lowest concentration of SARS-CoV-2 viral copies this assay can detect is 138 copies/mL. A negative result does not preclude SARS-Cov-2 infection and should not be used as the sole basis for treatment or other patient management decisions. A negative result may occur with  improper specimen collection/handling, submission of specimen other than nasopharyngeal swab, presence of viral mutation(s) within the areas targeted by this assay, and inadequate number of viral copies(<138 copies/mL). A negative result must be combined with clinical observations, patient history, and epidemiological information. The expected result is Negative.  Fact Sheet for Patients:  BloggerCourse.com  Fact Sheet for Healthcare Providers:  SeriousBroker.it  This test is no t yet approved or cleared by the [place] FDA and  has been authorized for detection and/or diagnosis of SARS-CoV-2 by FDA under an Emergency Use Authorization (EUA). This EUA will remain  in effect (meaning this test can be used) for the duration of the COVID-19 declaration under Section 564(b)(1) of the Act, 21 U.S.C.section 360bbb-3(b)(1), unless the authorization is terminated  or revoked sooner.       Influenza A by PCR NEGATIVE NEGATIVE Final   Influenza B by PCR NEGATIVE NEGATIVE Final    Comment: (NOTE) The Xpert Xpress SARS-CoV-2/FLU/RSV plus assay is intended as an aid in the diagnosis of influenza from Nasopharyngeal swab specimens and should not be used as a sole basis for treatment. Nasal washings and aspirates are unacceptable for Xpert Xpress SARS-CoV-2/FLU/RSV testing.  Fact Sheet for Patients: BloggerCourse.com  Fact Sheet for Healthcare Providers: SeriousBroker.it  This test is not yet approved or cleared by the [place] FDA and has been authorized for detection and/or diagnosis of  SARS-CoV-2 by FDA under an Emergency Use Authorization (EUA). This EUA will remain in effect (meaning this test can be used) for the duration of the COVID-19 declaration under Section 564(b)(1) of the Act, 21 U.S.C. section 360bbb-3(b)(1), unless the authorization is terminated or revoked.     Resp Syncytial Virus by PCR NEGATIVE NEGATIVE Final    Comment: (NOTE) Fact Sheet for Patients: BloggerCourse.com  Fact Sheet for Healthcare Providers: SeriousBroker.it  This test is not yet approved or cleared by the [place] FDA and has been authorized for detection and/or diagnosis of SARS-CoV-2 by FDA under an Emergency Use Authorization (EUA). This EUA will remain in effect (meaning this test can be used) for the duration of the COVID-19 declaration under Section 564(b)(1) of the Act, 21 U.S.C. section 360bbb-3(b)(1), unless the authorization is terminated or revoked.  Performed at [facility], [address], [place], [place] [address]   Antimicrobials/Microbiology: Anti-infectives (From admission, onward)    Start     Dose/Rate Route Frequency Ordered Stop   06/04/23 0245  azithromycin (ZITHROMAX) tablet 500 mg       Placed in "Followed by" Linked Group   500 mg Oral Daily 06/03/23 0242 06/08/23 0959   06/03/23 0242  azithromycin (ZITHROMAX) 500 mg in sodium chloride 0.9 % 250 mL IVPB       Placed in "Followed by" Linked Group   500 mg 250 mL/hr over 60 Minutes Intravenous Every 24 hours 06/03/23 0242 06/03/23 0502         Component Value Date/Time   SDES URINE, CLEAN CATCH 11/04/2015 1817   SPECREQUEST NONE 11/04/2015 1817   CULT NO GROWTH 11/04/2015 1817   REPTSTATUS 11/05/2015 FINAL 11/04/2015 1817     Radiology Studies: DG Chest 2 View Result Date: 06/02/2023 CLINICAL DATA:  Shortness of breath.  Recent influenza. EXAM: CHEST - 2 VIEW COMPARISON:  05/21/2023 FINDINGS: Heart and mediastinal  shadows are normal. Mild central bronchial thickening but no infiltrate, collapse or effusion. No abnormal bone finding. IMPRESSION: Mild central bronchial thickening. No infiltrate, collapse or effusion. Electronically Signed   By: Paulina Fusi M.D.   On: 06/02/2023 20:50     LOS: 1 day   Total time spent in review of labs and imaging, patient evaluation, formulation of plan, documentation and communication with family: 35 minutes  Lanae Boast, MD  [facility]  06/04/2023, 11:53 AM

## 2023-06-05 ENCOUNTER — Other Ambulatory Visit (HOSPITAL_COMMUNITY): Payer: Self-pay

## 2023-06-05 DIAGNOSIS — E876 Hypokalemia: Secondary | ICD-10-CM | POA: Diagnosis not present

## 2023-06-05 LAB — BASIC METABOLIC PANEL
Anion gap: 9 (ref 5–15)
BUN: 12 mg/dL (ref 6–20)
CO2: 21 mmol/L — ABNORMAL LOW (ref 22–32)
Calcium: 8.6 mg/dL — ABNORMAL LOW (ref 8.9–10.3)
Chloride: 109 mmol/L (ref 98–111)
Creatinine, Ser: 0.63 mg/dL (ref 0.61–1.24)
GFR, Estimated: >60 mL/min (ref >60)
Glucose, Bld: 108 mg/dL — ABNORMAL HIGH (ref 70–99)
Potassium: 3.6 mmol/L (ref 3.5–5.1)
Sodium: 139 mmol/L (ref 135–145)

## 2023-06-05 LAB — MAGNESIUM: Magnesium: 2.1 mg/dL (ref 1.7–2.4)

## 2023-06-05 MED ORDER — ALBUTEROL SULFATE HFA 108 (90 BASE) MCG/ACT IN AERS
1.0000 | INHALATION_SPRAY | Freq: Four times a day (QID) | RESPIRATORY_TRACT | 0 refills | Status: AC | PRN
  Filled 2023-06-05: qty 6.7, 20d supply, fill #0

## 2023-06-05 MED ORDER — PREDNISONE 10 MG PO TABS
ORAL_TABLET | ORAL | 0 refills | Status: AC
  Filled 2023-06-05: qty 20, 10d supply, fill #0

## 2023-06-05 NOTE — Discharge Summary (Signed)
Physician Discharge Summary  JAICE DIGIOIA VHQ:[id number] DOB: [birth year] DOA: 06/02/2023  PCP: Patient, No Pcp Per  Admit date: 06/02/2023 Discharge date: 06/05/2023 Recommendations for Outpatient Follow-up:  Follow up with PCP in 1 weeks-call for appointment Please obtain BMP/CBC in one week  Discharge Dispo: Home Discharge Condition: Stable Code Status:   Code Status: Full Code Diet recommendation:  Diet Order             Diet regular Room service appropriate? Yes; Fluid consistency: Thin  Diet effective now                    Brief/Interim Summary: 43 year old male with history of asthma COPD bipolar disorder who was diagnosed with influenza on1/29-with negative chest x-ray and discharged home return to the ED 2/10 evening with worsening shortness of breath since Saturday.  He also complained of vomiting and had diarrhea and feeling heaviness in the chest.  He has ran out of inhaler recently. In the ED initially tachycardic in 130s and tachypneic in 30 BP stable, on room air Labs showing hyponatremia 131 hypokalemia 2.8, hco3 19, BNP 57 troponin 16> 15, mag 1.4. flu covid rsv negative. Chest x-ray with mild central bronchial thickening with no infiltrate Patient was placed on azithromycin, Solu-Medrol and admitted for further management. Patient clinically improved had some mild cough at this time feels much improved will discharge on oral prednisone taper home inhaler renewed    Discharge Diagnoses:  Principal Problem:   Hypokalemia Active Problems:   ADHD (attention deficit hyperactivity disorder)   Leukocytosis   COPD exacerbation (HCC)   MDD (major depressive disorder), recurrent severe, without psychosis (HCC)   Bipolar II disorder (HCC)   Asthma exacerbation  Respiratory distress with shortness of breath Acute exacerbation of COPD/asthma: Positive influenza on 1/29: Patient diagnosed with influenza 2 weeks ago now with worsening shortness of breath.  Chest x-ray  no pneumonia, labs showing mild leukocytosis.Trops neg x 2.  Completed third dose azithromycin today at this time no fever.  Doing well.  Patient clinically improved had some mild cough at this time feels much improved will discharge on oral prednisone taper home inhaler renewed  Transaminitis: Trend labs.  Likely viral etiology.   Mild hyponatremia Hypokalemia Hypomagnesemia Mild metabolic acidosis bicarb 18-19: Repalced k.     Hypoalbuminemia: Augment nutrition   Major depressive disorder: Cont home meds   Consults: none Subjective: Aaox3 on RA  Eager to go home today  Discharge Exam: Vitals:   06/05/23 0831 06/05/23 0833  BP:    Pulse:    Resp:    Temp:    SpO2: 96% 96%   General: Pt is alert, awake, not in acute distress Cardiovascular: RRR, S1/S2 +, no rubs, no gallops Respiratory: CTA bilaterally, no wheezing, no rhonchi Abdominal: Soft, NT, ND, bowel sounds + Extremities: no edema, no cyanosis  Discharge Instructions  Discharge Instructions     Discharge instructions   Complete by: As directed    Please call call MD or return to ER for similar or worsening recurring problem that brought you to hospital or if any fever,nausea/vomiting,abdominal pain, uncontrolled pain, chest pain,  shortness of breath or any other alarming symptoms.  Please follow-up your doctor as instructed in a week time and call the office for appointment.  Please avoid alcohol, smoking, or any other illicit substance and maintain healthy habits including taking your regular medications as prescribed.  You were cared for by a hospitalist during your hospital stay. If  you have any questions about your discharge medications or the care you received while you were in the hospital after you are discharged, you can call the unit and ask to speak with the hospitalist on call if the hospitalist that took care of you is not available.  Once you are discharged, your primary care physician will  handle any further medical issues. Please note that NO REFILLS for any discharge medications will be authorized once you are discharged, as it is imperative that you return to your primary care physician (or establish a relationship with a primary care physician if you do not have one) for your aftercare needs so that they can reassess your need for medications and monitor your lab values   Increase activity slowly   Complete by: As directed       Allergies as of 06/05/2023       Reactions   Augmentin [amoxicillin-pot Clavulanate] Hives   Tolerates Amoxicillin - Thuy 09/29/12   Other Nausea And Vomiting   coconut        Medication List     TAKE these medications    albuterol 108 (90 Base) MCG/ACT inhaler Commonly known as: VENTOLIN HFA Inhale 1-2 puffs into the lungs every 6 (six) hours as needed for wheezing or shortness of breath.   predniSONE 10 MG tablet Commonly known as: DELTASONE Take by mouth 4 tabs daily x 2 days,3 tabs daily x 2 days,2 tabs daily x 2 days,1 tab daily x 2 days then stop.        Follow-up Information     [facility] Follow up in 1 week(s).   Contact information: [address] [place] [address] [phone]               Allergies  Allergen Reactions   Augmentin [Amoxicillin-Pot Clavulanate] Hives    Tolerates Amoxicillin - Thuy 09/29/12   Other Nausea And Vomiting    coconut    The results of significant diagnostics from this hospitalization (including imaging, microbiology, ancillary and laboratory) are listed below for reference.    Microbiology: Recent Results (from the past 240 hours)  Resp panel by RT-PCR (RSV, Flu A&B, Covid) Anterior Nasal Swab     Status: None   Collection Time: 06/02/23  8:22 PM   Specimen: Anterior Nasal Swab  Result Value Ref Range Status   SARS Coronavirus 2 by RT PCR NEGATIVE NEGATIVE Final    Comment: (NOTE) SARS-CoV-2 target nucleic  acids are NOT DETECTED.  The SARS-CoV-2 RNA is generally detectable in upper respiratory specimens during the acute phase of infection. The lowest concentration of SARS-CoV-2 viral copies this assay can detect is 138 copies/mL. A negative result does not preclude SARS-Cov-2 infection and should not be used as the sole basis for treatment or other patient management decisions. A negative result may occur with  improper specimen collection/handling, submission of specimen other than nasopharyngeal swab, presence of viral mutation(s) within the areas targeted by this assay, and inadequate number of viral copies(<138 copies/mL). A negative result must be combined with clinical observations, patient history, and epidemiological information. The expected result is Negative.  Fact Sheet for Patients:  BloggerCourse.com  Fact Sheet for Healthcare Providers:  SeriousBroker.it  This test is no t yet approved or cleared by the [place] FDA and  has been authorized for detection and/or diagnosis of SARS-CoV-2 by FDA under an Emergency Use Authorization (EUA). This EUA will remain  in effect (  meaning this test can be used) for the duration of the COVID-19 declaration under Section 564(b)(1) of the Act, 21 U.S.C.section 360bbb-3(b)(1), unless the authorization is terminated  or revoked sooner.       Influenza A by PCR NEGATIVE NEGATIVE Final   Influenza B by PCR NEGATIVE NEGATIVE Final    Comment: (NOTE) The Xpert Xpress SARS-CoV-2/FLU/RSV plus assay is intended as an aid in the diagnosis of influenza from Nasopharyngeal swab specimens and should not be used as a sole basis for treatment. Nasal washings and aspirates are unacceptable for Xpert Xpress SARS-CoV-2/FLU/RSV testing.  Fact Sheet for Patients: BloggerCourse.com  Fact Sheet for Healthcare Providers: SeriousBroker.it  This  test is not yet approved or cleared by the [place] FDA and has been authorized for detection and/or diagnosis of SARS-CoV-2 by FDA under an Emergency Use Authorization (EUA). This EUA will remain in effect (meaning this test can be used) for the duration of the COVID-19 declaration under Section 564(b)(1) of the Act, 21 U.S.C. section 360bbb-3(b)(1), unless the authorization is terminated or revoked.     Resp Syncytial Virus by PCR NEGATIVE NEGATIVE Final    Comment: (NOTE) Fact Sheet for Patients: BloggerCourse.com  Fact Sheet for Healthcare Providers: SeriousBroker.it  This test is not yet approved or cleared by the [place] FDA and has been authorized for detection and/or diagnosis of SARS-CoV-2 by FDA under an Emergency Use Authorization (EUA). This EUA will remain in effect (meaning this test can be used) for the duration of the COVID-19 declaration under Section 564(b)(1) of the Act, 21 U.S.C. section 360bbb-3(b)(1), unless the authorization is terminated or revoked.  Performed at [facility], [address], [place], [place] [address]     Procedures/Studies: DG Chest 2 View Result Date: 06/02/2023 CLINICAL DATA:  Shortness of breath.  Recent influenza. EXAM: CHEST - 2 VIEW COMPARISON:  05/21/2023 FINDINGS: Heart and mediastinal shadows are normal. Mild central bronchial thickening but no infiltrate, collapse or effusion. No abnormal bone finding. IMPRESSION: Mild central bronchial thickening. No infiltrate, collapse or effusion. Electronically Signed   By: Paulina Fusi M.D.   On: 06/02/2023 20:50   DG Chest 2 View Result Date: 05/21/2023 CLINICAL DATA:  Cough. EXAM: CHEST - 2 VIEW COMPARISON:  Chest radiograph dated April 28, 2023. FINDINGS: The heart size and mediastinal contours are within normal limits. No focal consolidation, pleural effusion, or pneumothorax. No acute osseous abnormality.  IMPRESSION: No acute cardiopulmonary findings. Electronically Signed   By: Hart Robinsons M.D.   On: 05/21/2023 11:57    Labs: BNP (last 3 results) Recent Labs    06/02/23 1947  BNP 57.3   Basic Metabolic Panel: Recent Labs  Lab 06/02/23 1947 06/02/23 2137 06/03/23 0503 06/05/23 0537  NA 131*  --  132* 139  K 2.8*  --  3.6 3.6  CL 95*  --  103 109  CO2 19*  --  18* 21*  GLUCOSE 81  --  138* 108*  BUN 8  --  9 12  CREATININE 0.99  --  0.76  0.78 0.63  CALCIUM 8.2*  --  7.7* 8.6*  MG  --  1.4*  --  2.1   Liver Function Tests: Recent Labs  Lab 06/03/23 0503  AST 148*  ALT 67*  ALKPHOS 80  BILITOT 0.8  PROT 6.5  ALBUMIN 2.8*   No results for input(s): "LIPASE", "AMYLASE" in the last 168 hours. No results for input(s): "AMMONIA" in the last 168 hours. CBC: Recent  Labs  Lab 06/02/23 1947 06/03/23 0503  WBC 11.8* 8.5  NEUTROABS 9.0*  --   HGB 15.3 14.2  HCT 44.8 42.4  MCV 89.6 89.5  PLT 317 276  Urinalysis    Component Value Date/Time   COLORURINE YELLOW 11/04/2015 1817   APPEARANCEUR CLEAR 11/04/2015 1817   LABSPEC 1.023 11/04/2015 1817   PHURINE 6.0 11/04/2015 1817   GLUCOSEU NEGATIVE 11/04/2015 1817   HGBUR NEGATIVE 11/04/2015 1817   BILIRUBINUR NEGATIVE 11/04/2015 1817   KETONESUR NEGATIVE 11/04/2015 1817   PROTEINUR NEGATIVE 11/04/2015 1817   UROBILINOGEN 1.0 08/02/2012 1255   NITRITE NEGATIVE 11/04/2015 1817   LEUKOCYTESUR NEGATIVE 11/04/2015 1817   Sepsis Labs Recent Labs  Lab 06/02/23 1947 06/03/23 0503  WBC 11.8* 8.5   Microbiology Recent Results (from the past 240 hours)  Resp panel by RT-PCR (RSV, Flu A&B, Covid) Anterior Nasal Swab     Status: None   Collection Time: 06/02/23  8:22 PM   Specimen: Anterior Nasal Swab  Result Value Ref Range Status   SARS Coronavirus 2 by RT PCR NEGATIVE NEGATIVE Final    Comment: (NOTE) SARS-CoV-2 target nucleic acids are NOT DETECTED.  The SARS-CoV-2 RNA is generally detectable in upper  respiratory specimens during the acute phase of infection. The lowest concentration of SARS-CoV-2 viral copies this assay can detect is 138 copies/mL. A negative result does not preclude SARS-Cov-2 infection and should not be used as the sole basis for treatment or other patient management decisions. A negative result may occur with  improper specimen collection/handling, submission of specimen other than nasopharyngeal swab, presence of viral mutation(s) within the areas targeted by this assay, and inadequate number of viral copies(<138 copies/mL). A negative result must be combined with clinical observations, patient history, and epidemiological information. The expected result is Negative.  Fact Sheet for Patients:  BloggerCourse.com  Fact Sheet for Healthcare Providers:  SeriousBroker.it  This test is no t yet approved or cleared by the [place] FDA and  has been authorized for detection and/or diagnosis of SARS-CoV-2 by FDA under an Emergency Use Authorization (EUA). This EUA will remain  in effect (meaning this test can be used) for the duration of the COVID-19 declaration under Section 564(b)(1) of the Act, 21 U.S.C.section 360bbb-3(b)(1), unless the authorization is terminated  or revoked sooner.       Influenza A by PCR NEGATIVE NEGATIVE Final   Influenza B by PCR NEGATIVE NEGATIVE Final    Comment: (NOTE) The Xpert Xpress SARS-CoV-2/FLU/RSV plus assay is intended as an aid in the diagnosis of influenza from Nasopharyngeal swab specimens and should not be used as a sole basis for treatment. Nasal washings and aspirates are unacceptable for Xpert Xpress SARS-CoV-2/FLU/RSV testing.  Fact Sheet for Patients: BloggerCourse.com  Fact Sheet for Healthcare Providers: SeriousBroker.it  This test is not yet approved or cleared by the [place] FDA and has been  authorized for detection and/or diagnosis of SARS-CoV-2 by FDA under an Emergency Use Authorization (EUA). This EUA will remain in effect (meaning this test can be used) for the duration of the COVID-19 declaration under Section 564(b)(1) of the Act, 21 U.S.C. section 360bbb-3(b)(1), unless the authorization is terminated or revoked.     Resp Syncytial Virus by PCR NEGATIVE NEGATIVE Final    Comment: (NOTE) Fact Sheet for Patients: BloggerCourse.com  Fact Sheet for Healthcare Providers: SeriousBroker.it  This test is not yet approved or cleared by the [place] FDA and has been authorized for detection and/or diagnosis of  SARS-CoV-2 by FDA under an Emergency Use Authorization (EUA). This EUA will remain in effect (meaning this test can be used) for the duration of the COVID-19 declaration under Section 564(b)(1) of the Act, 21 U.S.C. section 360bbb-3(b)(1), unless the authorization is terminated or revoked.  Performed at [facility], [address], [place], [place] [address]    Time coordinating discharge: 25 minutes SIGNED: Lanae Boast, MD  [facility] 06/05/2023, 2:24 PM  If 7PM-7AM, please contact night-coverage [email or web address]

## 2023-06-05 NOTE — Progress Notes (Signed)
AVS reviewed w/ pt who verbalized an understanding- pt requested a work excuse note - to return on Mon 2/17- secure chat sent to [name]. No Other questions at this time. Pt dressed for d/c- PIV removed as noted. Pt to d/c lounge to wait for his ride.

## 2023-06-27 ENCOUNTER — Other Ambulatory Visit (HOSPITAL_COMMUNITY): Payer: Self-pay

## 2023-11-18 ENCOUNTER — Other Ambulatory Visit (HOSPITAL_COMMUNITY): Payer: Self-pay

## 2023-11-19 ENCOUNTER — Other Ambulatory Visit (HOSPITAL_COMMUNITY): Payer: Self-pay

## 2024-05-25 ENCOUNTER — Emergency Department (HOSPITAL_COMMUNITY)

## 2024-05-25 ENCOUNTER — Emergency Department (HOSPITAL_COMMUNITY): Admission: EM | Admit: 2024-05-25 | Discharge: 2024-05-25 | Disposition: A | Discharge status: Home / Self Care

## 2024-05-25 DIAGNOSIS — J020 Streptococcal pharyngitis: Secondary | ICD-10-CM | POA: Insufficient documentation

## 2024-05-25 DIAGNOSIS — E871 Hypo-osmolality and hyponatremia: Secondary | ICD-10-CM | POA: Insufficient documentation

## 2024-05-25 DIAGNOSIS — D72829 Elevated white blood cell count, unspecified: Secondary | ICD-10-CM | POA: Insufficient documentation

## 2024-05-25 DIAGNOSIS — J4521 Mild intermittent asthma with (acute) exacerbation: Secondary | ICD-10-CM | POA: Insufficient documentation

## 2024-05-25 DIAGNOSIS — J449 Chronic obstructive pulmonary disease, unspecified: Secondary | ICD-10-CM | POA: Insufficient documentation

## 2024-05-25 LAB — COMPREHENSIVE METABOLIC PANEL WITH GFR
ALT: 34 U/L (ref 0–44)
AST: 39 U/L (ref 15–41)
Albumin: 4.3 g/dL (ref 3.5–5.0)
Alkaline Phosphatase: 72 U/L (ref 38–126)
Anion gap: 14 (ref 5–15)
BUN: 12 mg/dL (ref 6–20)
CO2: 20 mmol/L — ABNORMAL LOW (ref 22–32)
Calcium: 9.7 mg/dL (ref 8.9–10.3)
Chloride: 99 mmol/L (ref 98–111)
Creatinine, Ser: 0.88 mg/dL (ref 0.61–1.24)
GFR, Estimated: >60 mL/min
Glucose, Bld: 106 mg/dL — ABNORMAL HIGH (ref 70–99)
Potassium: 4.2 mmol/L (ref 3.5–5.1)
Sodium: 133 mmol/L — ABNORMAL LOW (ref 135–145)
Total Bilirubin: 0.5 mg/dL (ref 0.0–1.2)
Total Protein: 7.7 g/dL (ref 6.5–8.1)

## 2024-05-25 LAB — LIPASE, BLOOD: Lipase: 25 U/L (ref 11–51)

## 2024-05-25 LAB — CBC
HCT: 45.3 % (ref 39.0–52.0)
Hemoglobin: 15.1 g/dL (ref 13.0–17.0)
MCH: 31.9 pg (ref 26.0–34.0)
MCHC: 33.3 g/dL (ref 30.0–36.0)
MCV: 95.8 fL (ref 80.0–100.0)
Platelets: 295 10*3/uL (ref 150–400)
RBC: 4.73 MIL/uL (ref 4.22–5.81)
RDW: 12.3 % (ref 11.5–15.5)
WBC: 13.1 10*3/uL — ABNORMAL HIGH (ref 4.0–10.5)
nRBC: 0 % (ref 0.0–0.2)

## 2024-05-25 LAB — GROUP A STREP BY PCR: Group A Strep by PCR: DETECTED — AB

## 2024-05-25 LAB — RESP PANEL BY RT-PCR (RSV, FLU A&B, COVID)  RVPGX2
Influenza A by PCR: NEGATIVE
Influenza B by PCR: NEGATIVE
Resp Syncytial Virus by PCR: NEGATIVE
SARS Coronavirus 2 by RT PCR: NEGATIVE

## 2024-05-25 LAB — TROPONIN T, HIGH SENSITIVITY
Troponin T High Sensitivity: 8 ng/L (ref 0–19)
Troponin T High Sensitivity: 10 ng/L (ref 0–19)

## 2024-05-25 MED ORDER — SODIUM CHLORIDE 0.9 % IV BOLUS
1000.0000 mL | Freq: Once | INTRAVENOUS | Status: AC
  Administered 2024-05-25: 1000 mL via INTRAVENOUS

## 2024-05-25 MED ORDER — KETOROLAC TROMETHAMINE 15 MG/ML IJ SOLN
15.0000 mg | Freq: Once | INTRAMUSCULAR | Status: AC
  Administered 2024-05-25: 15 mg via INTRAVENOUS
  Filled 2024-05-25: qty 1

## 2024-05-25 MED ORDER — ONDANSETRON HCL 4 MG/2ML IJ SOLN
4.0000 mg | Freq: Once | INTRAMUSCULAR | Status: AC
  Administered 2024-05-25: 4 mg via INTRAVENOUS
  Filled 2024-05-25: qty 2

## 2024-05-25 MED ORDER — AZITHROMYCIN 500 MG PO TABS: 500.0000 mg | ORAL_TABLET | Freq: Every day | ORAL | 0 refills | Status: AC

## 2024-05-25 MED ORDER — METHYLPREDNISOLONE SODIUM SUCC 125 MG IJ SOLR
125.0000 mg | Freq: Once | INTRAMUSCULAR | Status: AC
  Administered 2024-05-25: 125 mg via INTRAVENOUS
  Filled 2024-05-25: qty 2

## 2024-05-25 MED ORDER — IPRATROPIUM-ALBUTEROL 0.5-2.5 (3) MG/3ML IN SOLN
3.0000 mL | Freq: Once | RESPIRATORY_TRACT | Status: AC
  Administered 2024-05-25: 3 mL via RESPIRATORY_TRACT
  Filled 2024-05-25: qty 3

## 2024-05-25 MED ORDER — PREDNISONE 20 MG PO TABS: 40.0000 mg | ORAL_TABLET | Freq: Every day | ORAL | 0 refills | Status: AC

## 2024-05-25 MED ORDER — ALBUTEROL SULFATE HFA 108 (90 BASE) MCG/ACT IN AERS: 1.0000 | INHALATION_SPRAY | Freq: Four times a day (QID) | RESPIRATORY_TRACT | 0 refills | Status: AC | PRN

## 2024-05-25 MED ORDER — AZITHROMYCIN 250 MG PO TABS
500.0000 mg | ORAL_TABLET | Freq: Once | ORAL | Status: AC
  Administered 2024-05-25: 500 mg via ORAL
  Filled 2024-05-25: qty 2

## 2024-05-25 MED ORDER — ALBUTEROL SULFATE HFA 108 (90 BASE) MCG/ACT IN AERS
4.0000 | INHALATION_SPRAY | Freq: Once | RESPIRATORY_TRACT | Status: AC
  Administered 2024-05-25: 4 via RESPIRATORY_TRACT
  Filled 2024-05-25: qty 6.7

## 2024-05-25 MED ORDER — ACETAMINOPHEN 325 MG PO TABS
650.0000 mg | ORAL_TABLET | Freq: Once | ORAL | Status: AC
  Administered 2024-05-25: 650 mg via ORAL
  Filled 2024-05-25: qty 2
